# Patient Record
Sex: Female | Born: 1935
Health system: Southern US, Community
[De-identification: ages and names within clinical notes are randomized; demographics above are authoritative.]

## PROBLEM LIST (undated history)

## (undated) DIAGNOSIS — F329 Major depressive disorder, single episode, unspecified: Secondary | ICD-10-CM

## (undated) DIAGNOSIS — E559 Vitamin D deficiency, unspecified: Secondary | ICD-10-CM

## (undated) DIAGNOSIS — E039 Hypothyroidism, unspecified: Secondary | ICD-10-CM

## (undated) DIAGNOSIS — I10 Essential (primary) hypertension: Secondary | ICD-10-CM

## (undated) DIAGNOSIS — F0391 Unspecified dementia with behavioral disturbance: Secondary | ICD-10-CM

## (undated) DIAGNOSIS — E785 Hyperlipidemia, unspecified: Secondary | ICD-10-CM

## (undated) DIAGNOSIS — N189 Chronic kidney disease, unspecified: Secondary | ICD-10-CM

## (undated) DIAGNOSIS — B86 Scabies: Secondary | ICD-10-CM

## (undated) DIAGNOSIS — F32A Depression, unspecified: Secondary | ICD-10-CM

## (undated) DIAGNOSIS — E119 Type 2 diabetes mellitus without complications: Secondary | ICD-10-CM

## (undated) HISTORY — DX: Vitamin D deficiency, unspecified: E55.9

## (undated) HISTORY — DX: Type 2 diabetes mellitus without complications: E11.9

## (undated) HISTORY — DX: Scabies: B86

## (undated) HISTORY — DX: Chronic kidney disease, unspecified: N18.9

## (undated) HISTORY — DX: Major depressive disorder, single episode, unspecified: F32.9

## (undated) HISTORY — DX: Hyperlipidemia, unspecified: E78.5

## (undated) HISTORY — DX: Essential (primary) hypertension: I10

## (undated) HISTORY — DX: Depression, unspecified: F32.A

## (undated) HISTORY — DX: Unspecified dementia with behavioral disturbance: F03.91

## (undated) HISTORY — PX: OTHER SURGICAL HISTORY: SHX169

## (undated) HISTORY — DX: Hypothyroidism, unspecified: E03.9

---
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
## 2015-03-19 DIAGNOSIS — F0391 Unspecified dementia with behavioral disturbance: Secondary | ICD-10-CM

## 2015-03-19 DIAGNOSIS — Z7189 Other specified counseling: Secondary | ICD-10-CM | POA: Insufficient documentation

## 2015-03-19 DIAGNOSIS — F03918 Unspecified dementia, unspecified severity, with other behavioral disturbance: Secondary | ICD-10-CM

## 2015-03-19 HISTORY — DX: Unspecified dementia, unspecified severity, with other behavioral disturbance: F03.918

## 2015-03-19 HISTORY — DX: Unspecified dementia with behavioral disturbance: F03.91

## 2015-06-22 DIAGNOSIS — D709 Neutropenia, unspecified: Secondary | ICD-10-CM | POA: Diagnosis not present

## 2015-06-22 DIAGNOSIS — N183 Chronic kidney disease, stage 3 (moderate): Secondary | ICD-10-CM | POA: Diagnosis not present

## 2015-07-19 DIAGNOSIS — F0390 Unspecified dementia without behavioral disturbance: Secondary | ICD-10-CM | POA: Diagnosis not present

## 2015-07-20 DIAGNOSIS — N390 Urinary tract infection, site not specified: Secondary | ICD-10-CM | POA: Diagnosis not present

## 2015-07-20 DIAGNOSIS — N183 Chronic kidney disease, stage 3 (moderate): Secondary | ICD-10-CM | POA: Diagnosis not present

## 2015-07-21 DIAGNOSIS — R489 Unspecified symbolic dysfunctions: Secondary | ICD-10-CM | POA: Diagnosis not present

## 2015-07-21 DIAGNOSIS — R2689 Other abnormalities of gait and mobility: Secondary | ICD-10-CM | POA: Diagnosis not present

## 2015-07-21 DIAGNOSIS — M6281 Muscle weakness (generalized): Secondary | ICD-10-CM | POA: Diagnosis not present

## 2015-07-21 DIAGNOSIS — F0391 Unspecified dementia with behavioral disturbance: Secondary | ICD-10-CM | POA: Diagnosis not present

## 2015-07-21 DIAGNOSIS — F329 Major depressive disorder, single episode, unspecified: Secondary | ICD-10-CM | POA: Diagnosis not present

## 2015-07-21 DIAGNOSIS — R41841 Cognitive communication deficit: Secondary | ICD-10-CM | POA: Diagnosis not present

## 2015-07-21 DIAGNOSIS — N183 Chronic kidney disease, stage 3 (moderate): Secondary | ICD-10-CM | POA: Diagnosis not present

## 2015-07-22 DIAGNOSIS — N183 Chronic kidney disease, stage 3 (moderate): Secondary | ICD-10-CM | POA: Diagnosis not present

## 2015-07-22 DIAGNOSIS — R489 Unspecified symbolic dysfunctions: Secondary | ICD-10-CM | POA: Diagnosis not present

## 2015-07-22 DIAGNOSIS — M6281 Muscle weakness (generalized): Secondary | ICD-10-CM | POA: Diagnosis not present

## 2015-07-22 DIAGNOSIS — F329 Major depressive disorder, single episode, unspecified: Secondary | ICD-10-CM | POA: Diagnosis not present

## 2015-07-22 DIAGNOSIS — R2689 Other abnormalities of gait and mobility: Secondary | ICD-10-CM | POA: Diagnosis not present

## 2015-07-22 DIAGNOSIS — F0391 Unspecified dementia with behavioral disturbance: Secondary | ICD-10-CM | POA: Diagnosis not present

## 2015-07-24 DIAGNOSIS — R2689 Other abnormalities of gait and mobility: Secondary | ICD-10-CM | POA: Diagnosis not present

## 2015-07-24 DIAGNOSIS — N183 Chronic kidney disease, stage 3 (moderate): Secondary | ICD-10-CM | POA: Diagnosis not present

## 2015-07-24 DIAGNOSIS — R489 Unspecified symbolic dysfunctions: Secondary | ICD-10-CM | POA: Diagnosis not present

## 2015-07-24 DIAGNOSIS — F329 Major depressive disorder, single episode, unspecified: Secondary | ICD-10-CM | POA: Diagnosis not present

## 2015-07-24 DIAGNOSIS — F0391 Unspecified dementia with behavioral disturbance: Secondary | ICD-10-CM | POA: Diagnosis not present

## 2015-07-24 DIAGNOSIS — M6281 Muscle weakness (generalized): Secondary | ICD-10-CM | POA: Diagnosis not present

## 2015-07-25 DIAGNOSIS — F329 Major depressive disorder, single episode, unspecified: Secondary | ICD-10-CM | POA: Diagnosis not present

## 2015-07-25 DIAGNOSIS — F0391 Unspecified dementia with behavioral disturbance: Secondary | ICD-10-CM | POA: Diagnosis not present

## 2015-07-25 DIAGNOSIS — M6281 Muscle weakness (generalized): Secondary | ICD-10-CM | POA: Diagnosis not present

## 2015-07-25 DIAGNOSIS — E119 Type 2 diabetes mellitus without complications: Secondary | ICD-10-CM | POA: Diagnosis not present

## 2015-07-25 DIAGNOSIS — R2689 Other abnormalities of gait and mobility: Secondary | ICD-10-CM | POA: Diagnosis not present

## 2015-07-25 DIAGNOSIS — R489 Unspecified symbolic dysfunctions: Secondary | ICD-10-CM | POA: Diagnosis not present

## 2015-07-25 DIAGNOSIS — N183 Chronic kidney disease, stage 3 (moderate): Secondary | ICD-10-CM | POA: Diagnosis not present

## 2015-07-25 DIAGNOSIS — H25813 Combined forms of age-related cataract, bilateral: Secondary | ICD-10-CM | POA: Diagnosis not present

## 2015-07-26 DIAGNOSIS — R2689 Other abnormalities of gait and mobility: Secondary | ICD-10-CM | POA: Diagnosis not present

## 2015-07-26 DIAGNOSIS — M6281 Muscle weakness (generalized): Secondary | ICD-10-CM | POA: Diagnosis not present

## 2015-07-26 DIAGNOSIS — F329 Major depressive disorder, single episode, unspecified: Secondary | ICD-10-CM | POA: Diagnosis not present

## 2015-07-26 DIAGNOSIS — R489 Unspecified symbolic dysfunctions: Secondary | ICD-10-CM | POA: Diagnosis not present

## 2015-07-26 DIAGNOSIS — N183 Chronic kidney disease, stage 3 (moderate): Secondary | ICD-10-CM | POA: Diagnosis not present

## 2015-07-26 DIAGNOSIS — F0391 Unspecified dementia with behavioral disturbance: Secondary | ICD-10-CM | POA: Diagnosis not present

## 2015-07-27 DIAGNOSIS — R489 Unspecified symbolic dysfunctions: Secondary | ICD-10-CM | POA: Diagnosis not present

## 2015-07-27 DIAGNOSIS — F0391 Unspecified dementia with behavioral disturbance: Secondary | ICD-10-CM | POA: Diagnosis not present

## 2015-07-27 DIAGNOSIS — M6281 Muscle weakness (generalized): Secondary | ICD-10-CM | POA: Diagnosis not present

## 2015-07-27 DIAGNOSIS — N183 Chronic kidney disease, stage 3 (moderate): Secondary | ICD-10-CM | POA: Diagnosis not present

## 2015-07-27 DIAGNOSIS — R2689 Other abnormalities of gait and mobility: Secondary | ICD-10-CM | POA: Diagnosis not present

## 2015-07-27 DIAGNOSIS — F329 Major depressive disorder, single episode, unspecified: Secondary | ICD-10-CM | POA: Diagnosis not present

## 2015-07-28 DIAGNOSIS — F0391 Unspecified dementia with behavioral disturbance: Secondary | ICD-10-CM | POA: Diagnosis not present

## 2015-07-28 DIAGNOSIS — N183 Chronic kidney disease, stage 3 (moderate): Secondary | ICD-10-CM | POA: Diagnosis not present

## 2015-07-28 DIAGNOSIS — R489 Unspecified symbolic dysfunctions: Secondary | ICD-10-CM | POA: Diagnosis not present

## 2015-07-28 DIAGNOSIS — F329 Major depressive disorder, single episode, unspecified: Secondary | ICD-10-CM | POA: Diagnosis not present

## 2015-07-28 DIAGNOSIS — M6281 Muscle weakness (generalized): Secondary | ICD-10-CM | POA: Diagnosis not present

## 2015-07-28 DIAGNOSIS — R2689 Other abnormalities of gait and mobility: Secondary | ICD-10-CM | POA: Diagnosis not present

## 2015-08-02 DIAGNOSIS — N183 Chronic kidney disease, stage 3 (moderate): Secondary | ICD-10-CM | POA: Diagnosis not present

## 2015-08-02 DIAGNOSIS — R489 Unspecified symbolic dysfunctions: Secondary | ICD-10-CM | POA: Diagnosis not present

## 2015-08-02 DIAGNOSIS — F329 Major depressive disorder, single episode, unspecified: Secondary | ICD-10-CM | POA: Diagnosis not present

## 2015-08-02 DIAGNOSIS — R2689 Other abnormalities of gait and mobility: Secondary | ICD-10-CM | POA: Diagnosis not present

## 2015-08-02 DIAGNOSIS — M6281 Muscle weakness (generalized): Secondary | ICD-10-CM | POA: Diagnosis not present

## 2015-08-02 DIAGNOSIS — F0390 Unspecified dementia without behavioral disturbance: Secondary | ICD-10-CM | POA: Diagnosis not present

## 2015-08-02 DIAGNOSIS — F0391 Unspecified dementia with behavioral disturbance: Secondary | ICD-10-CM | POA: Diagnosis not present

## 2015-08-03 DIAGNOSIS — M6281 Muscle weakness (generalized): Secondary | ICD-10-CM | POA: Diagnosis not present

## 2015-08-03 DIAGNOSIS — F329 Major depressive disorder, single episode, unspecified: Secondary | ICD-10-CM | POA: Diagnosis not present

## 2015-08-03 DIAGNOSIS — N183 Chronic kidney disease, stage 3 (moderate): Secondary | ICD-10-CM | POA: Diagnosis not present

## 2015-08-03 DIAGNOSIS — R489 Unspecified symbolic dysfunctions: Secondary | ICD-10-CM | POA: Diagnosis not present

## 2015-08-03 DIAGNOSIS — F0391 Unspecified dementia with behavioral disturbance: Secondary | ICD-10-CM | POA: Diagnosis not present

## 2015-08-03 DIAGNOSIS — R2689 Other abnormalities of gait and mobility: Secondary | ICD-10-CM | POA: Diagnosis not present

## 2015-08-04 DIAGNOSIS — F0391 Unspecified dementia with behavioral disturbance: Secondary | ICD-10-CM | POA: Diagnosis not present

## 2015-08-04 DIAGNOSIS — M6281 Muscle weakness (generalized): Secondary | ICD-10-CM | POA: Diagnosis not present

## 2015-08-04 DIAGNOSIS — R489 Unspecified symbolic dysfunctions: Secondary | ICD-10-CM | POA: Diagnosis not present

## 2015-08-04 DIAGNOSIS — F329 Major depressive disorder, single episode, unspecified: Secondary | ICD-10-CM | POA: Diagnosis not present

## 2015-08-04 DIAGNOSIS — R2689 Other abnormalities of gait and mobility: Secondary | ICD-10-CM | POA: Diagnosis not present

## 2015-08-04 DIAGNOSIS — N183 Chronic kidney disease, stage 3 (moderate): Secondary | ICD-10-CM | POA: Diagnosis not present

## 2015-08-05 DIAGNOSIS — R489 Unspecified symbolic dysfunctions: Secondary | ICD-10-CM | POA: Diagnosis not present

## 2015-08-05 DIAGNOSIS — F329 Major depressive disorder, single episode, unspecified: Secondary | ICD-10-CM | POA: Diagnosis not present

## 2015-08-05 DIAGNOSIS — M6281 Muscle weakness (generalized): Secondary | ICD-10-CM | POA: Diagnosis not present

## 2015-08-05 DIAGNOSIS — N183 Chronic kidney disease, stage 3 (moderate): Secondary | ICD-10-CM | POA: Diagnosis not present

## 2015-08-05 DIAGNOSIS — F0391 Unspecified dementia with behavioral disturbance: Secondary | ICD-10-CM | POA: Diagnosis not present

## 2015-08-05 DIAGNOSIS — R2689 Other abnormalities of gait and mobility: Secondary | ICD-10-CM | POA: Diagnosis not present

## 2015-08-08 DIAGNOSIS — N183 Chronic kidney disease, stage 3 (moderate): Secondary | ICD-10-CM | POA: Diagnosis not present

## 2015-08-08 DIAGNOSIS — R2689 Other abnormalities of gait and mobility: Secondary | ICD-10-CM | POA: Diagnosis not present

## 2015-08-08 DIAGNOSIS — R489 Unspecified symbolic dysfunctions: Secondary | ICD-10-CM | POA: Diagnosis not present

## 2015-08-08 DIAGNOSIS — F329 Major depressive disorder, single episode, unspecified: Secondary | ICD-10-CM | POA: Diagnosis not present

## 2015-08-08 DIAGNOSIS — F0391 Unspecified dementia with behavioral disturbance: Secondary | ICD-10-CM | POA: Diagnosis not present

## 2015-08-08 DIAGNOSIS — M6281 Muscle weakness (generalized): Secondary | ICD-10-CM | POA: Diagnosis not present

## 2015-08-09 DIAGNOSIS — R489 Unspecified symbolic dysfunctions: Secondary | ICD-10-CM | POA: Diagnosis not present

## 2015-08-09 DIAGNOSIS — M6281 Muscle weakness (generalized): Secondary | ICD-10-CM | POA: Diagnosis not present

## 2015-08-09 DIAGNOSIS — F329 Major depressive disorder, single episode, unspecified: Secondary | ICD-10-CM | POA: Diagnosis not present

## 2015-08-09 DIAGNOSIS — F0391 Unspecified dementia with behavioral disturbance: Secondary | ICD-10-CM | POA: Diagnosis not present

## 2015-08-09 DIAGNOSIS — R2689 Other abnormalities of gait and mobility: Secondary | ICD-10-CM | POA: Diagnosis not present

## 2015-08-09 DIAGNOSIS — N183 Chronic kidney disease, stage 3 (moderate): Secondary | ICD-10-CM | POA: Diagnosis not present

## 2015-08-10 DIAGNOSIS — F0391 Unspecified dementia with behavioral disturbance: Secondary | ICD-10-CM | POA: Diagnosis not present

## 2015-08-10 DIAGNOSIS — R489 Unspecified symbolic dysfunctions: Secondary | ICD-10-CM | POA: Diagnosis not present

## 2015-08-10 DIAGNOSIS — M6281 Muscle weakness (generalized): Secondary | ICD-10-CM | POA: Diagnosis not present

## 2015-08-10 DIAGNOSIS — F329 Major depressive disorder, single episode, unspecified: Secondary | ICD-10-CM | POA: Diagnosis not present

## 2015-08-10 DIAGNOSIS — R2689 Other abnormalities of gait and mobility: Secondary | ICD-10-CM | POA: Diagnosis not present

## 2015-08-10 DIAGNOSIS — N183 Chronic kidney disease, stage 3 (moderate): Secondary | ICD-10-CM | POA: Diagnosis not present

## 2015-08-11 DIAGNOSIS — M6281 Muscle weakness (generalized): Secondary | ICD-10-CM | POA: Diagnosis not present

## 2015-08-11 DIAGNOSIS — F329 Major depressive disorder, single episode, unspecified: Secondary | ICD-10-CM | POA: Diagnosis not present

## 2015-08-11 DIAGNOSIS — R2689 Other abnormalities of gait and mobility: Secondary | ICD-10-CM | POA: Diagnosis not present

## 2015-08-11 DIAGNOSIS — N183 Chronic kidney disease, stage 3 (moderate): Secondary | ICD-10-CM | POA: Diagnosis not present

## 2015-08-11 DIAGNOSIS — R489 Unspecified symbolic dysfunctions: Secondary | ICD-10-CM | POA: Diagnosis not present

## 2015-08-11 DIAGNOSIS — F0391 Unspecified dementia with behavioral disturbance: Secondary | ICD-10-CM | POA: Diagnosis not present

## 2015-08-12 DIAGNOSIS — M6281 Muscle weakness (generalized): Secondary | ICD-10-CM | POA: Diagnosis not present

## 2015-08-12 DIAGNOSIS — R2689 Other abnormalities of gait and mobility: Secondary | ICD-10-CM | POA: Diagnosis not present

## 2015-08-12 DIAGNOSIS — F0391 Unspecified dementia with behavioral disturbance: Secondary | ICD-10-CM | POA: Diagnosis not present

## 2015-08-12 DIAGNOSIS — R489 Unspecified symbolic dysfunctions: Secondary | ICD-10-CM | POA: Diagnosis not present

## 2015-08-12 DIAGNOSIS — N183 Chronic kidney disease, stage 3 (moderate): Secondary | ICD-10-CM | POA: Diagnosis not present

## 2015-08-12 DIAGNOSIS — F329 Major depressive disorder, single episode, unspecified: Secondary | ICD-10-CM | POA: Diagnosis not present

## 2015-08-15 DIAGNOSIS — F0391 Unspecified dementia with behavioral disturbance: Secondary | ICD-10-CM | POA: Diagnosis not present

## 2015-08-15 DIAGNOSIS — F329 Major depressive disorder, single episode, unspecified: Secondary | ICD-10-CM | POA: Diagnosis not present

## 2015-08-15 DIAGNOSIS — R2689 Other abnormalities of gait and mobility: Secondary | ICD-10-CM | POA: Diagnosis not present

## 2015-08-15 DIAGNOSIS — N183 Chronic kidney disease, stage 3 (moderate): Secondary | ICD-10-CM | POA: Diagnosis not present

## 2015-08-15 DIAGNOSIS — M6281 Muscle weakness (generalized): Secondary | ICD-10-CM | POA: Diagnosis not present

## 2015-08-15 DIAGNOSIS — R489 Unspecified symbolic dysfunctions: Secondary | ICD-10-CM | POA: Diagnosis not present

## 2015-08-16 DIAGNOSIS — F329 Major depressive disorder, single episode, unspecified: Secondary | ICD-10-CM | POA: Diagnosis not present

## 2015-08-16 DIAGNOSIS — M6281 Muscle weakness (generalized): Secondary | ICD-10-CM | POA: Diagnosis not present

## 2015-08-16 DIAGNOSIS — F0391 Unspecified dementia with behavioral disturbance: Secondary | ICD-10-CM | POA: Diagnosis not present

## 2015-08-16 DIAGNOSIS — R2689 Other abnormalities of gait and mobility: Secondary | ICD-10-CM | POA: Diagnosis not present

## 2015-08-16 DIAGNOSIS — N183 Chronic kidney disease, stage 3 (moderate): Secondary | ICD-10-CM | POA: Diagnosis not present

## 2015-08-16 DIAGNOSIS — R489 Unspecified symbolic dysfunctions: Secondary | ICD-10-CM | POA: Diagnosis not present

## 2015-08-17 DIAGNOSIS — R41841 Cognitive communication deficit: Secondary | ICD-10-CM | POA: Diagnosis not present

## 2015-08-17 DIAGNOSIS — F0391 Unspecified dementia with behavioral disturbance: Secondary | ICD-10-CM | POA: Diagnosis not present

## 2015-08-18 DIAGNOSIS — R41841 Cognitive communication deficit: Secondary | ICD-10-CM | POA: Diagnosis not present

## 2015-08-18 DIAGNOSIS — F0391 Unspecified dementia with behavioral disturbance: Secondary | ICD-10-CM | POA: Diagnosis not present

## 2015-08-19 DIAGNOSIS — R41841 Cognitive communication deficit: Secondary | ICD-10-CM | POA: Diagnosis not present

## 2015-08-19 DIAGNOSIS — F0391 Unspecified dementia with behavioral disturbance: Secondary | ICD-10-CM | POA: Diagnosis not present

## 2015-08-22 DIAGNOSIS — F0391 Unspecified dementia with behavioral disturbance: Secondary | ICD-10-CM | POA: Diagnosis not present

## 2015-08-22 DIAGNOSIS — R41841 Cognitive communication deficit: Secondary | ICD-10-CM | POA: Diagnosis not present

## 2015-08-23 DIAGNOSIS — F0390 Unspecified dementia without behavioral disturbance: Secondary | ICD-10-CM | POA: Diagnosis not present

## 2015-08-23 DIAGNOSIS — F0391 Unspecified dementia with behavioral disturbance: Secondary | ICD-10-CM | POA: Diagnosis not present

## 2015-08-23 DIAGNOSIS — R41841 Cognitive communication deficit: Secondary | ICD-10-CM | POA: Diagnosis not present

## 2015-08-24 DIAGNOSIS — F0391 Unspecified dementia with behavioral disturbance: Secondary | ICD-10-CM | POA: Diagnosis not present

## 2015-08-24 DIAGNOSIS — R41841 Cognitive communication deficit: Secondary | ICD-10-CM | POA: Diagnosis not present

## 2015-08-24 DIAGNOSIS — N183 Chronic kidney disease, stage 3 (moderate): Secondary | ICD-10-CM | POA: Diagnosis not present

## 2015-08-24 DIAGNOSIS — N390 Urinary tract infection, site not specified: Secondary | ICD-10-CM | POA: Diagnosis not present

## 2015-08-25 DIAGNOSIS — R41841 Cognitive communication deficit: Secondary | ICD-10-CM | POA: Diagnosis not present

## 2015-08-25 DIAGNOSIS — N390 Urinary tract infection, site not specified: Secondary | ICD-10-CM | POA: Diagnosis not present

## 2015-08-25 DIAGNOSIS — R358 Other polyuria: Secondary | ICD-10-CM | POA: Diagnosis not present

## 2015-08-25 DIAGNOSIS — F0391 Unspecified dementia with behavioral disturbance: Secondary | ICD-10-CM | POA: Diagnosis not present

## 2015-09-02 DIAGNOSIS — N359 Urethral stricture, unspecified: Secondary | ICD-10-CM | POA: Diagnosis not present

## 2015-09-02 DIAGNOSIS — R300 Dysuria: Secondary | ICD-10-CM | POA: Diagnosis not present

## 2015-09-20 DIAGNOSIS — F0390 Unspecified dementia without behavioral disturbance: Secondary | ICD-10-CM | POA: Diagnosis not present

## 2015-10-05 DIAGNOSIS — M79672 Pain in left foot: Secondary | ICD-10-CM | POA: Diagnosis not present

## 2015-10-05 DIAGNOSIS — E119 Type 2 diabetes mellitus without complications: Secondary | ICD-10-CM | POA: Diagnosis not present

## 2015-10-05 DIAGNOSIS — B351 Tinea unguium: Secondary | ICD-10-CM | POA: Diagnosis not present

## 2015-10-05 DIAGNOSIS — M79671 Pain in right foot: Secondary | ICD-10-CM | POA: Diagnosis not present

## 2015-11-04 DIAGNOSIS — N359 Urethral stricture, unspecified: Secondary | ICD-10-CM | POA: Diagnosis not present

## 2015-11-18 DIAGNOSIS — E119 Type 2 diabetes mellitus without complications: Secondary | ICD-10-CM | POA: Diagnosis not present

## 2015-11-18 DIAGNOSIS — D519 Vitamin B12 deficiency anemia, unspecified: Secondary | ICD-10-CM | POA: Diagnosis not present

## 2015-11-18 DIAGNOSIS — I10 Essential (primary) hypertension: Secondary | ICD-10-CM | POA: Diagnosis not present

## 2015-11-18 DIAGNOSIS — N183 Chronic kidney disease, stage 3 (moderate): Secondary | ICD-10-CM | POA: Diagnosis not present

## 2015-11-18 DIAGNOSIS — E785 Hyperlipidemia, unspecified: Secondary | ICD-10-CM | POA: Diagnosis not present

## 2015-11-18 DIAGNOSIS — E039 Hypothyroidism, unspecified: Secondary | ICD-10-CM | POA: Diagnosis not present

## 2015-11-18 DIAGNOSIS — E559 Vitamin D deficiency, unspecified: Secondary | ICD-10-CM | POA: Diagnosis not present

## 2015-12-23 DIAGNOSIS — N183 Chronic kidney disease, stage 3 (moderate): Secondary | ICD-10-CM | POA: Diagnosis not present

## 2015-12-23 DIAGNOSIS — F23 Brief psychotic disorder: Secondary | ICD-10-CM | POA: Diagnosis not present

## 2015-12-23 DIAGNOSIS — R262 Difficulty in walking, not elsewhere classified: Secondary | ICD-10-CM | POA: Diagnosis not present

## 2015-12-23 DIAGNOSIS — Z741 Need for assistance with personal care: Secondary | ICD-10-CM | POA: Diagnosis not present

## 2015-12-23 DIAGNOSIS — F0391 Unspecified dementia with behavioral disturbance: Secondary | ICD-10-CM | POA: Diagnosis not present

## 2015-12-23 DIAGNOSIS — Z9181 History of falling: Secondary | ICD-10-CM | POA: Diagnosis not present

## 2015-12-23 DIAGNOSIS — R278 Other lack of coordination: Secondary | ICD-10-CM | POA: Diagnosis not present

## 2015-12-23 DIAGNOSIS — M6281 Muscle weakness (generalized): Secondary | ICD-10-CM | POA: Diagnosis not present

## 2015-12-23 DIAGNOSIS — F329 Major depressive disorder, single episode, unspecified: Secondary | ICD-10-CM | POA: Diagnosis not present

## 2015-12-26 DIAGNOSIS — Z9181 History of falling: Secondary | ICD-10-CM | POA: Diagnosis not present

## 2015-12-26 DIAGNOSIS — F23 Brief psychotic disorder: Secondary | ICD-10-CM | POA: Diagnosis not present

## 2015-12-26 DIAGNOSIS — N183 Chronic kidney disease, stage 3 (moderate): Secondary | ICD-10-CM | POA: Diagnosis not present

## 2015-12-26 DIAGNOSIS — F0391 Unspecified dementia with behavioral disturbance: Secondary | ICD-10-CM | POA: Diagnosis not present

## 2015-12-26 DIAGNOSIS — Z741 Need for assistance with personal care: Secondary | ICD-10-CM | POA: Diagnosis not present

## 2015-12-26 DIAGNOSIS — R278 Other lack of coordination: Secondary | ICD-10-CM | POA: Diagnosis not present

## 2015-12-27 DIAGNOSIS — Z741 Need for assistance with personal care: Secondary | ICD-10-CM | POA: Diagnosis not present

## 2015-12-27 DIAGNOSIS — F0391 Unspecified dementia with behavioral disturbance: Secondary | ICD-10-CM | POA: Diagnosis not present

## 2015-12-27 DIAGNOSIS — N183 Chronic kidney disease, stage 3 (moderate): Secondary | ICD-10-CM | POA: Diagnosis not present

## 2015-12-27 DIAGNOSIS — R278 Other lack of coordination: Secondary | ICD-10-CM | POA: Diagnosis not present

## 2015-12-27 DIAGNOSIS — F23 Brief psychotic disorder: Secondary | ICD-10-CM | POA: Diagnosis not present

## 2015-12-27 DIAGNOSIS — Z9181 History of falling: Secondary | ICD-10-CM | POA: Diagnosis not present

## 2015-12-28 DIAGNOSIS — F0391 Unspecified dementia with behavioral disturbance: Secondary | ICD-10-CM | POA: Diagnosis not present

## 2015-12-28 DIAGNOSIS — R278 Other lack of coordination: Secondary | ICD-10-CM | POA: Diagnosis not present

## 2015-12-28 DIAGNOSIS — Z741 Need for assistance with personal care: Secondary | ICD-10-CM | POA: Diagnosis not present

## 2015-12-28 DIAGNOSIS — N183 Chronic kidney disease, stage 3 (moderate): Secondary | ICD-10-CM | POA: Diagnosis not present

## 2015-12-28 DIAGNOSIS — Z9181 History of falling: Secondary | ICD-10-CM | POA: Diagnosis not present

## 2015-12-28 DIAGNOSIS — F23 Brief psychotic disorder: Secondary | ICD-10-CM | POA: Diagnosis not present

## 2015-12-29 DIAGNOSIS — F23 Brief psychotic disorder: Secondary | ICD-10-CM | POA: Diagnosis not present

## 2015-12-29 DIAGNOSIS — N183 Chronic kidney disease, stage 3 (moderate): Secondary | ICD-10-CM | POA: Diagnosis not present

## 2015-12-29 DIAGNOSIS — Z741 Need for assistance with personal care: Secondary | ICD-10-CM | POA: Diagnosis not present

## 2015-12-29 DIAGNOSIS — R278 Other lack of coordination: Secondary | ICD-10-CM | POA: Diagnosis not present

## 2015-12-29 DIAGNOSIS — Z9181 History of falling: Secondary | ICD-10-CM | POA: Diagnosis not present

## 2015-12-29 DIAGNOSIS — F0391 Unspecified dementia with behavioral disturbance: Secondary | ICD-10-CM | POA: Diagnosis not present

## 2015-12-29 DIAGNOSIS — R2241 Localized swelling, mass and lump, right lower limb: Secondary | ICD-10-CM | POA: Diagnosis not present

## 2015-12-30 DIAGNOSIS — N183 Chronic kidney disease, stage 3 (moderate): Secondary | ICD-10-CM | POA: Diagnosis not present

## 2015-12-30 DIAGNOSIS — R278 Other lack of coordination: Secondary | ICD-10-CM | POA: Diagnosis not present

## 2015-12-30 DIAGNOSIS — F23 Brief psychotic disorder: Secondary | ICD-10-CM | POA: Diagnosis not present

## 2015-12-30 DIAGNOSIS — Z741 Need for assistance with personal care: Secondary | ICD-10-CM | POA: Diagnosis not present

## 2015-12-30 DIAGNOSIS — F0391 Unspecified dementia with behavioral disturbance: Secondary | ICD-10-CM | POA: Diagnosis not present

## 2015-12-30 DIAGNOSIS — Z9181 History of falling: Secondary | ICD-10-CM | POA: Diagnosis not present

## 2016-01-02 DIAGNOSIS — N183 Chronic kidney disease, stage 3 (moderate): Secondary | ICD-10-CM | POA: Diagnosis not present

## 2016-01-02 DIAGNOSIS — F23 Brief psychotic disorder: Secondary | ICD-10-CM | POA: Diagnosis not present

## 2016-01-02 DIAGNOSIS — Z741 Need for assistance with personal care: Secondary | ICD-10-CM | POA: Diagnosis not present

## 2016-01-02 DIAGNOSIS — R278 Other lack of coordination: Secondary | ICD-10-CM | POA: Diagnosis not present

## 2016-01-02 DIAGNOSIS — Z9181 History of falling: Secondary | ICD-10-CM | POA: Diagnosis not present

## 2016-01-02 DIAGNOSIS — F0391 Unspecified dementia with behavioral disturbance: Secondary | ICD-10-CM | POA: Diagnosis not present

## 2016-01-03 DIAGNOSIS — N183 Chronic kidney disease, stage 3 (moderate): Secondary | ICD-10-CM | POA: Diagnosis not present

## 2016-01-03 DIAGNOSIS — F0391 Unspecified dementia with behavioral disturbance: Secondary | ICD-10-CM | POA: Diagnosis not present

## 2016-01-03 DIAGNOSIS — R278 Other lack of coordination: Secondary | ICD-10-CM | POA: Diagnosis not present

## 2016-01-03 DIAGNOSIS — Z741 Need for assistance with personal care: Secondary | ICD-10-CM | POA: Diagnosis not present

## 2016-01-03 DIAGNOSIS — Z9181 History of falling: Secondary | ICD-10-CM | POA: Diagnosis not present

## 2016-01-03 DIAGNOSIS — F23 Brief psychotic disorder: Secondary | ICD-10-CM | POA: Diagnosis not present

## 2016-01-04 DIAGNOSIS — F23 Brief psychotic disorder: Secondary | ICD-10-CM | POA: Diagnosis not present

## 2016-01-04 DIAGNOSIS — N183 Chronic kidney disease, stage 3 (moderate): Secondary | ICD-10-CM | POA: Diagnosis not present

## 2016-01-04 DIAGNOSIS — Z9181 History of falling: Secondary | ICD-10-CM | POA: Diagnosis not present

## 2016-01-04 DIAGNOSIS — Z741 Need for assistance with personal care: Secondary | ICD-10-CM | POA: Diagnosis not present

## 2016-01-04 DIAGNOSIS — R278 Other lack of coordination: Secondary | ICD-10-CM | POA: Diagnosis not present

## 2016-01-04 DIAGNOSIS — F0391 Unspecified dementia with behavioral disturbance: Secondary | ICD-10-CM | POA: Diagnosis not present

## 2016-01-05 DIAGNOSIS — F0391 Unspecified dementia with behavioral disturbance: Secondary | ICD-10-CM | POA: Diagnosis not present

## 2016-01-05 DIAGNOSIS — F23 Brief psychotic disorder: Secondary | ICD-10-CM | POA: Diagnosis not present

## 2016-01-05 DIAGNOSIS — Z9181 History of falling: Secondary | ICD-10-CM | POA: Diagnosis not present

## 2016-01-05 DIAGNOSIS — R278 Other lack of coordination: Secondary | ICD-10-CM | POA: Diagnosis not present

## 2016-01-05 DIAGNOSIS — N183 Chronic kidney disease, stage 3 (moderate): Secondary | ICD-10-CM | POA: Diagnosis not present

## 2016-01-05 DIAGNOSIS — Z741 Need for assistance with personal care: Secondary | ICD-10-CM | POA: Diagnosis not present

## 2016-01-06 DIAGNOSIS — F0391 Unspecified dementia with behavioral disturbance: Secondary | ICD-10-CM | POA: Diagnosis not present

## 2016-01-06 DIAGNOSIS — F23 Brief psychotic disorder: Secondary | ICD-10-CM | POA: Diagnosis not present

## 2016-01-06 DIAGNOSIS — R278 Other lack of coordination: Secondary | ICD-10-CM | POA: Diagnosis not present

## 2016-01-06 DIAGNOSIS — Z741 Need for assistance with personal care: Secondary | ICD-10-CM | POA: Diagnosis not present

## 2016-01-06 DIAGNOSIS — Z9181 History of falling: Secondary | ICD-10-CM | POA: Diagnosis not present

## 2016-01-06 DIAGNOSIS — E039 Hypothyroidism, unspecified: Secondary | ICD-10-CM | POA: Diagnosis not present

## 2016-01-06 DIAGNOSIS — N183 Chronic kidney disease, stage 3 (moderate): Secondary | ICD-10-CM | POA: Diagnosis not present

## 2016-01-09 DIAGNOSIS — R278 Other lack of coordination: Secondary | ICD-10-CM | POA: Diagnosis not present

## 2016-01-09 DIAGNOSIS — F23 Brief psychotic disorder: Secondary | ICD-10-CM | POA: Diagnosis not present

## 2016-01-09 DIAGNOSIS — N183 Chronic kidney disease, stage 3 (moderate): Secondary | ICD-10-CM | POA: Diagnosis not present

## 2016-01-09 DIAGNOSIS — Z741 Need for assistance with personal care: Secondary | ICD-10-CM | POA: Diagnosis not present

## 2016-01-09 DIAGNOSIS — F0391 Unspecified dementia with behavioral disturbance: Secondary | ICD-10-CM | POA: Diagnosis not present

## 2016-01-09 DIAGNOSIS — Z9181 History of falling: Secondary | ICD-10-CM | POA: Diagnosis not present

## 2016-01-25 DIAGNOSIS — B351 Tinea unguium: Secondary | ICD-10-CM | POA: Diagnosis not present

## 2016-01-25 DIAGNOSIS — M79674 Pain in right toe(s): Secondary | ICD-10-CM | POA: Diagnosis not present

## 2016-01-25 DIAGNOSIS — E1159 Type 2 diabetes mellitus with other circulatory complications: Secondary | ICD-10-CM | POA: Diagnosis not present

## 2016-01-25 DIAGNOSIS — M79675 Pain in left toe(s): Secondary | ICD-10-CM | POA: Diagnosis not present

## 2016-02-06 DIAGNOSIS — R600 Localized edema: Secondary | ICD-10-CM | POA: Diagnosis not present

## 2016-02-06 DIAGNOSIS — G4700 Insomnia, unspecified: Secondary | ICD-10-CM | POA: Diagnosis not present

## 2016-02-06 DIAGNOSIS — E039 Hypothyroidism, unspecified: Secondary | ICD-10-CM | POA: Diagnosis not present

## 2016-02-06 DIAGNOSIS — F0281 Dementia in other diseases classified elsewhere with behavioral disturbance: Secondary | ICD-10-CM | POA: Diagnosis not present

## 2016-02-07 DIAGNOSIS — R0602 Shortness of breath: Secondary | ICD-10-CM | POA: Diagnosis not present

## 2016-02-07 DIAGNOSIS — E039 Hypothyroidism, unspecified: Secondary | ICD-10-CM | POA: Diagnosis not present

## 2016-02-07 DIAGNOSIS — N183 Chronic kidney disease, stage 3 (moderate): Secondary | ICD-10-CM | POA: Diagnosis not present

## 2016-02-17 DIAGNOSIS — R269 Unspecified abnormalities of gait and mobility: Secondary | ICD-10-CM | POA: Diagnosis not present

## 2016-02-17 DIAGNOSIS — R4182 Altered mental status, unspecified: Secondary | ICD-10-CM | POA: Diagnosis not present

## 2016-02-17 DIAGNOSIS — Z6828 Body mass index (BMI) 28.0-28.9, adult: Secondary | ICD-10-CM | POA: Diagnosis not present

## 2016-02-17 DIAGNOSIS — F0281 Dementia in other diseases classified elsewhere with behavioral disturbance: Secondary | ICD-10-CM | POA: Diagnosis not present

## 2016-02-21 DIAGNOSIS — E559 Vitamin D deficiency, unspecified: Secondary | ICD-10-CM | POA: Diagnosis not present

## 2016-02-21 DIAGNOSIS — E039 Hypothyroidism, unspecified: Secondary | ICD-10-CM | POA: Diagnosis not present

## 2016-02-21 DIAGNOSIS — R600 Localized edema: Secondary | ICD-10-CM | POA: Diagnosis not present

## 2016-02-21 DIAGNOSIS — B86 Scabies: Secondary | ICD-10-CM | POA: Diagnosis not present

## 2016-02-29 DIAGNOSIS — F0391 Unspecified dementia with behavioral disturbance: Secondary | ICD-10-CM | POA: Diagnosis not present

## 2016-02-29 DIAGNOSIS — E119 Type 2 diabetes mellitus without complications: Secondary | ICD-10-CM | POA: Diagnosis not present

## 2016-02-29 DIAGNOSIS — I10 Essential (primary) hypertension: Secondary | ICD-10-CM | POA: Diagnosis not present

## 2016-02-29 DIAGNOSIS — F23 Brief psychotic disorder: Secondary | ICD-10-CM | POA: Diagnosis not present

## 2016-02-29 DIAGNOSIS — Z9181 History of falling: Secondary | ICD-10-CM | POA: Diagnosis not present

## 2016-02-29 DIAGNOSIS — R2689 Other abnormalities of gait and mobility: Secondary | ICD-10-CM | POA: Diagnosis not present

## 2016-03-01 DIAGNOSIS — F0391 Unspecified dementia with behavioral disturbance: Secondary | ICD-10-CM | POA: Diagnosis not present

## 2016-03-01 DIAGNOSIS — R2689 Other abnormalities of gait and mobility: Secondary | ICD-10-CM | POA: Diagnosis not present

## 2016-03-01 DIAGNOSIS — I10 Essential (primary) hypertension: Secondary | ICD-10-CM | POA: Diagnosis not present

## 2016-03-01 DIAGNOSIS — E119 Type 2 diabetes mellitus without complications: Secondary | ICD-10-CM | POA: Diagnosis not present

## 2016-03-01 DIAGNOSIS — F23 Brief psychotic disorder: Secondary | ICD-10-CM | POA: Diagnosis not present

## 2016-03-01 DIAGNOSIS — Z9181 History of falling: Secondary | ICD-10-CM | POA: Diagnosis not present

## 2016-03-04 DIAGNOSIS — F0391 Unspecified dementia with behavioral disturbance: Secondary | ICD-10-CM | POA: Diagnosis not present

## 2016-03-04 DIAGNOSIS — E119 Type 2 diabetes mellitus without complications: Secondary | ICD-10-CM | POA: Diagnosis not present

## 2016-03-04 DIAGNOSIS — R2689 Other abnormalities of gait and mobility: Secondary | ICD-10-CM | POA: Diagnosis not present

## 2016-03-04 DIAGNOSIS — Z9181 History of falling: Secondary | ICD-10-CM | POA: Diagnosis not present

## 2016-03-04 DIAGNOSIS — F23 Brief psychotic disorder: Secondary | ICD-10-CM | POA: Diagnosis not present

## 2016-03-04 DIAGNOSIS — I10 Essential (primary) hypertension: Secondary | ICD-10-CM | POA: Diagnosis not present

## 2016-03-05 DIAGNOSIS — I10 Essential (primary) hypertension: Secondary | ICD-10-CM | POA: Diagnosis not present

## 2016-03-05 DIAGNOSIS — E119 Type 2 diabetes mellitus without complications: Secondary | ICD-10-CM | POA: Diagnosis not present

## 2016-03-05 DIAGNOSIS — F23 Brief psychotic disorder: Secondary | ICD-10-CM | POA: Diagnosis not present

## 2016-03-05 DIAGNOSIS — F0391 Unspecified dementia with behavioral disturbance: Secondary | ICD-10-CM | POA: Diagnosis not present

## 2016-03-05 DIAGNOSIS — R2689 Other abnormalities of gait and mobility: Secondary | ICD-10-CM | POA: Diagnosis not present

## 2016-03-05 DIAGNOSIS — Z9181 History of falling: Secondary | ICD-10-CM | POA: Diagnosis not present

## 2016-03-06 DIAGNOSIS — I10 Essential (primary) hypertension: Secondary | ICD-10-CM | POA: Diagnosis not present

## 2016-03-06 DIAGNOSIS — E119 Type 2 diabetes mellitus without complications: Secondary | ICD-10-CM | POA: Diagnosis not present

## 2016-03-06 DIAGNOSIS — F23 Brief psychotic disorder: Secondary | ICD-10-CM | POA: Diagnosis not present

## 2016-03-06 DIAGNOSIS — R2689 Other abnormalities of gait and mobility: Secondary | ICD-10-CM | POA: Diagnosis not present

## 2016-03-06 DIAGNOSIS — Z9181 History of falling: Secondary | ICD-10-CM | POA: Diagnosis not present

## 2016-03-06 DIAGNOSIS — F0391 Unspecified dementia with behavioral disturbance: Secondary | ICD-10-CM | POA: Diagnosis not present

## 2016-03-07 DIAGNOSIS — F23 Brief psychotic disorder: Secondary | ICD-10-CM | POA: Diagnosis not present

## 2016-03-07 DIAGNOSIS — R2689 Other abnormalities of gait and mobility: Secondary | ICD-10-CM | POA: Diagnosis not present

## 2016-03-07 DIAGNOSIS — Z9181 History of falling: Secondary | ICD-10-CM | POA: Diagnosis not present

## 2016-03-07 DIAGNOSIS — E119 Type 2 diabetes mellitus without complications: Secondary | ICD-10-CM | POA: Diagnosis not present

## 2016-03-07 DIAGNOSIS — F0391 Unspecified dementia with behavioral disturbance: Secondary | ICD-10-CM | POA: Diagnosis not present

## 2016-03-07 DIAGNOSIS — I10 Essential (primary) hypertension: Secondary | ICD-10-CM | POA: Diagnosis not present

## 2016-03-08 DIAGNOSIS — F0391 Unspecified dementia with behavioral disturbance: Secondary | ICD-10-CM | POA: Diagnosis not present

## 2016-03-08 DIAGNOSIS — R2689 Other abnormalities of gait and mobility: Secondary | ICD-10-CM | POA: Diagnosis not present

## 2016-03-08 DIAGNOSIS — E119 Type 2 diabetes mellitus without complications: Secondary | ICD-10-CM | POA: Diagnosis not present

## 2016-03-08 DIAGNOSIS — F23 Brief psychotic disorder: Secondary | ICD-10-CM | POA: Diagnosis not present

## 2016-03-08 DIAGNOSIS — I10 Essential (primary) hypertension: Secondary | ICD-10-CM | POA: Diagnosis not present

## 2016-03-08 DIAGNOSIS — Z9181 History of falling: Secondary | ICD-10-CM | POA: Diagnosis not present

## 2016-03-09 DIAGNOSIS — Z9181 History of falling: Secondary | ICD-10-CM | POA: Diagnosis not present

## 2016-03-09 DIAGNOSIS — N183 Chronic kidney disease, stage 3 (moderate): Secondary | ICD-10-CM | POA: Diagnosis not present

## 2016-03-09 DIAGNOSIS — E119 Type 2 diabetes mellitus without complications: Secondary | ICD-10-CM | POA: Diagnosis not present

## 2016-03-09 DIAGNOSIS — R2689 Other abnormalities of gait and mobility: Secondary | ICD-10-CM | POA: Diagnosis not present

## 2016-03-09 DIAGNOSIS — F23 Brief psychotic disorder: Secondary | ICD-10-CM | POA: Diagnosis not present

## 2016-03-09 DIAGNOSIS — I10 Essential (primary) hypertension: Secondary | ICD-10-CM | POA: Diagnosis not present

## 2016-03-09 DIAGNOSIS — F0391 Unspecified dementia with behavioral disturbance: Secondary | ICD-10-CM | POA: Diagnosis not present

## 2016-03-10 DIAGNOSIS — R4182 Altered mental status, unspecified: Secondary | ICD-10-CM | POA: Diagnosis not present

## 2016-03-10 DIAGNOSIS — R269 Unspecified abnormalities of gait and mobility: Secondary | ICD-10-CM | POA: Diagnosis not present

## 2016-03-12 DIAGNOSIS — Z9181 History of falling: Secondary | ICD-10-CM | POA: Diagnosis not present

## 2016-03-12 DIAGNOSIS — I10 Essential (primary) hypertension: Secondary | ICD-10-CM | POA: Diagnosis not present

## 2016-03-12 DIAGNOSIS — F23 Brief psychotic disorder: Secondary | ICD-10-CM | POA: Diagnosis not present

## 2016-03-12 DIAGNOSIS — F0391 Unspecified dementia with behavioral disturbance: Secondary | ICD-10-CM | POA: Diagnosis not present

## 2016-03-12 DIAGNOSIS — E119 Type 2 diabetes mellitus without complications: Secondary | ICD-10-CM | POA: Diagnosis not present

## 2016-03-12 DIAGNOSIS — R2689 Other abnormalities of gait and mobility: Secondary | ICD-10-CM | POA: Diagnosis not present

## 2016-03-13 DIAGNOSIS — E119 Type 2 diabetes mellitus without complications: Secondary | ICD-10-CM | POA: Diagnosis not present

## 2016-03-13 DIAGNOSIS — Z9181 History of falling: Secondary | ICD-10-CM | POA: Diagnosis not present

## 2016-03-13 DIAGNOSIS — R2689 Other abnormalities of gait and mobility: Secondary | ICD-10-CM | POA: Diagnosis not present

## 2016-03-13 DIAGNOSIS — I10 Essential (primary) hypertension: Secondary | ICD-10-CM | POA: Diagnosis not present

## 2016-03-13 DIAGNOSIS — F23 Brief psychotic disorder: Secondary | ICD-10-CM | POA: Diagnosis not present

## 2016-03-13 DIAGNOSIS — F0391 Unspecified dementia with behavioral disturbance: Secondary | ICD-10-CM | POA: Diagnosis not present

## 2016-03-14 DIAGNOSIS — I10 Essential (primary) hypertension: Secondary | ICD-10-CM | POA: Diagnosis not present

## 2016-03-14 DIAGNOSIS — Z9181 History of falling: Secondary | ICD-10-CM | POA: Diagnosis not present

## 2016-03-14 DIAGNOSIS — R2689 Other abnormalities of gait and mobility: Secondary | ICD-10-CM | POA: Diagnosis not present

## 2016-03-14 DIAGNOSIS — F0391 Unspecified dementia with behavioral disturbance: Secondary | ICD-10-CM | POA: Diagnosis not present

## 2016-03-14 DIAGNOSIS — F23 Brief psychotic disorder: Secondary | ICD-10-CM | POA: Diagnosis not present

## 2016-03-14 DIAGNOSIS — E119 Type 2 diabetes mellitus without complications: Secondary | ICD-10-CM | POA: Diagnosis not present

## 2016-03-15 DIAGNOSIS — F0391 Unspecified dementia with behavioral disturbance: Secondary | ICD-10-CM | POA: Diagnosis not present

## 2016-03-15 DIAGNOSIS — Z9181 History of falling: Secondary | ICD-10-CM | POA: Diagnosis not present

## 2016-03-15 DIAGNOSIS — E119 Type 2 diabetes mellitus without complications: Secondary | ICD-10-CM | POA: Diagnosis not present

## 2016-03-15 DIAGNOSIS — F23 Brief psychotic disorder: Secondary | ICD-10-CM | POA: Diagnosis not present

## 2016-03-15 DIAGNOSIS — I10 Essential (primary) hypertension: Secondary | ICD-10-CM | POA: Diagnosis not present

## 2016-03-15 DIAGNOSIS — R2689 Other abnormalities of gait and mobility: Secondary | ICD-10-CM | POA: Diagnosis not present

## 2016-03-16 DIAGNOSIS — E119 Type 2 diabetes mellitus without complications: Secondary | ICD-10-CM | POA: Diagnosis not present

## 2016-03-16 DIAGNOSIS — F0391 Unspecified dementia with behavioral disturbance: Secondary | ICD-10-CM | POA: Diagnosis not present

## 2016-03-16 DIAGNOSIS — Z9181 History of falling: Secondary | ICD-10-CM | POA: Diagnosis not present

## 2016-03-16 DIAGNOSIS — R2689 Other abnormalities of gait and mobility: Secondary | ICD-10-CM | POA: Diagnosis not present

## 2016-03-16 DIAGNOSIS — I10 Essential (primary) hypertension: Secondary | ICD-10-CM | POA: Diagnosis not present

## 2016-03-16 DIAGNOSIS — F23 Brief psychotic disorder: Secondary | ICD-10-CM | POA: Diagnosis not present

## 2016-03-19 DIAGNOSIS — Z23 Encounter for immunization: Secondary | ICD-10-CM | POA: Diagnosis not present

## 2016-03-20 DIAGNOSIS — Z23 Encounter for immunization: Secondary | ICD-10-CM | POA: Diagnosis not present

## 2016-03-21 DIAGNOSIS — Z23 Encounter for immunization: Secondary | ICD-10-CM | POA: Diagnosis not present

## 2016-03-23 DIAGNOSIS — E039 Hypothyroidism, unspecified: Secondary | ICD-10-CM | POA: Diagnosis not present

## 2016-04-12 ENCOUNTER — Non-Acute Institutional Stay (SKILLED_NURSING_FACILITY): Payer: Medicare Other | Admitting: Internal Medicine

## 2016-04-12 ENCOUNTER — Encounter: Payer: Self-pay | Admitting: Internal Medicine

## 2016-04-12 DIAGNOSIS — E039 Hypothyroidism, unspecified: Secondary | ICD-10-CM | POA: Diagnosis not present

## 2016-04-12 DIAGNOSIS — F0391 Unspecified dementia with behavioral disturbance: Secondary | ICD-10-CM

## 2016-04-12 DIAGNOSIS — I10 Essential (primary) hypertension: Secondary | ICD-10-CM | POA: Diagnosis not present

## 2016-04-12 DIAGNOSIS — N189 Chronic kidney disease, unspecified: Secondary | ICD-10-CM | POA: Diagnosis not present

## 2016-04-12 DIAGNOSIS — E1129 Type 2 diabetes mellitus with other diabetic kidney complication: Secondary | ICD-10-CM

## 2016-04-12 DIAGNOSIS — F03918 Unspecified dementia, unspecified severity, with other behavioral disturbance: Secondary | ICD-10-CM | POA: Insufficient documentation

## 2016-04-12 DIAGNOSIS — R7303 Prediabetes: Secondary | ICD-10-CM | POA: Insufficient documentation

## 2016-04-12 NOTE — Assessment & Plan Note (Signed)
Hemoglobin A1c BMET

## 2016-04-12 NOTE — Assessment & Plan Note (Signed)
TSH 

## 2016-04-12 NOTE — Assessment & Plan Note (Signed)
On no antihypertensive at this time She'll be monitored and medication initiated if blood pressure averages greater than 140

## 2016-04-12 NOTE — Progress Notes (Signed)
   [facility]  Room: 226B  Dr. Marga MelnickWilliam Hopper [address] [place] [id number]  Chief Complaint  Patient presents with  . New Admit To SNF    [facility] Ad[facility]    This is a comprehensive admission note to [facility] performed on this date less than 30 days from date of admission. Included are preadmission medical/surgical history;reconciled medication list; family history; social history and comprehensive review of systems.  Corrections and additions to the records were documented . Comprehensive physical exam was also performed. Additionally a clinical summary was entered for each active diagnosis pertinent to this admission in the Problem List to enhance continuity of care.  PCP: Previously Dr Blima LedgerJoel Bass ,[facility], [place], [place]  HPI: The only information on the chart at this time is an FL 2 form from the prior skilled nursing facility [place]. The patient is unable to give any meaningful history due to severe, advanced dementia. [facility] she has unspecified dementia with behavioral disturbance, hypertension, type 2 diabetes, chronic kidney disease, and major depressive disorder. She is on the atypical psychotropics Abilify and Mirtazapine  as well as Aricept, Bupropion, Depakote,& trazodone  Past medical and surgical history: available, will be requested  Social history: Not available  Family history: Not available  Review of systems:Could not be completed due to dementia. She was unable to give the date or the president. She did know her date of birth and was in able to identify Obama as the last president. Her only complaints are dry mouth and urinary frequency. The latter complaint was repeated several times during the interview.  Physical exam:  Pertinent or positive findings: The most striking finding is is that she appears much younger than her stated age. She has ptosis greater on the  left than the right.  S4 is present. Breath sounds are decreased. Pedal pulses are decreased. Severe dementia as noted. She became somewhat emotionally uncomfortable during the exam, chaperone Vikki Ports(Valerie) was present during the examination  General appearance:Adequately nourished; no acute distress , increased work of breathing is present.   Lymphatic: No lymphadenopathy about the head, neck, axilla  Eyes: No conjunctival inflammation or lid edema is present. There is no scleral icterus. Ears:  External ear exam shows no significant lesions or deformities.   Nose:  External nasal examination shows no deformity or inflammation. Nasal mucosa are pink and moist without lesions ,exudates Oral exam: lips and gums are healthy appearing.There is no oropharyngeal erythema or exudate . Neck:  No thyromegaly, masses, tenderness noted.    Heart:  Normal rate and regular rhythm. S1 and S2 normal without gallop, murmur, click, rub .  Lungs:Chest clear to auscultation without wheezes, rhonchi,rales , rubs. Abdomen:Bowel sounds are normal. Abdomen is soft and nontender with no organomegaly, hernias,masses. GU: deferred as previously addressed. Extremities:  No cyanosis, clubbing,edema  Neurologic exam : Strength equal  in upper & lower extremities Balance,Rhomberg,finger to nose testing could not be completed due to clinical state Deep tendon reflexes are equal Skin: Warm & dry w/o tenting. No significant lesions or rash.  See clinical summary under each active problem in the Problem List with associated updated therapeutic plan

## 2016-04-12 NOTE — Assessment & Plan Note (Signed)
BMET CBC

## 2016-04-12 NOTE — Patient Instructions (Signed)
See specific orders & goals under each diagnosis 

## 2016-04-12 NOTE — Assessment & Plan Note (Signed)
Psychiatry consultation to assess present regimen

## 2016-04-13 ENCOUNTER — Encounter: Payer: Self-pay | Admitting: Internal Medicine

## 2016-04-13 DIAGNOSIS — I10 Essential (primary) hypertension: Secondary | ICD-10-CM | POA: Diagnosis not present

## 2016-04-13 DIAGNOSIS — R946 Abnormal results of thyroid function studies: Secondary | ICD-10-CM | POA: Diagnosis not present

## 2016-04-13 DIAGNOSIS — D509 Iron deficiency anemia, unspecified: Secondary | ICD-10-CM | POA: Diagnosis not present

## 2016-04-13 DIAGNOSIS — E1165 Type 2 diabetes mellitus with hyperglycemia: Secondary | ICD-10-CM | POA: Diagnosis not present

## 2016-04-17 LAB — BASIC METABOLIC PANEL
BUN: 25 mg/dL — AB (ref 4–21)
Creatinine: 1.2 mg/dL — AB (ref 0.5–1.1)
Glucose: 107 mg/dL
Potassium: 4.5 mmol/L (ref 3.4–5.3)
Sodium: 141 mmol/L (ref 137–147)

## 2016-04-17 LAB — HEMOGLOBIN A1C: HEMOGLOBIN A1C: 6

## 2016-04-17 LAB — TSH: TSH: 13.58 u[IU]/mL — AB (ref 0.41–5.90)

## 2016-04-17 LAB — CBC AND DIFFERENTIAL
HCT: 39 % (ref 36–46)
HEMOGLOBIN: 13.1 g/dL (ref 12.0–16.0)
NEUTROS ABS: 2058 /uL
PLATELETS: 226 10*3/uL (ref 150–399)
WBC: 4.9 10^3/mL

## 2016-04-18 DIAGNOSIS — N183 Chronic kidney disease, stage 3 (moderate): Secondary | ICD-10-CM | POA: Diagnosis not present

## 2016-04-18 DIAGNOSIS — M6281 Muscle weakness (generalized): Secondary | ICD-10-CM | POA: Diagnosis not present

## 2016-04-18 DIAGNOSIS — I129 Hypertensive chronic kidney disease with stage 1 through stage 4 chronic kidney disease, or unspecified chronic kidney disease: Secondary | ICD-10-CM | POA: Diagnosis not present

## 2016-04-19 DIAGNOSIS — M6281 Muscle weakness (generalized): Secondary | ICD-10-CM | POA: Diagnosis not present

## 2016-04-19 DIAGNOSIS — N183 Chronic kidney disease, stage 3 (moderate): Secondary | ICD-10-CM | POA: Diagnosis not present

## 2016-04-19 DIAGNOSIS — I129 Hypertensive chronic kidney disease with stage 1 through stage 4 chronic kidney disease, or unspecified chronic kidney disease: Secondary | ICD-10-CM | POA: Diagnosis not present

## 2016-04-20 ENCOUNTER — Encounter: Payer: Self-pay | Admitting: Nurse Practitioner

## 2016-04-20 ENCOUNTER — Non-Acute Institutional Stay (SKILLED_NURSING_FACILITY): Payer: Medicare Other | Admitting: Nurse Practitioner

## 2016-04-20 DIAGNOSIS — F329 Major depressive disorder, single episode, unspecified: Secondary | ICD-10-CM | POA: Diagnosis not present

## 2016-04-20 DIAGNOSIS — R634 Abnormal weight loss: Secondary | ICD-10-CM

## 2016-04-20 DIAGNOSIS — F0391 Unspecified dementia with behavioral disturbance: Secondary | ICD-10-CM | POA: Diagnosis not present

## 2016-04-20 DIAGNOSIS — N183 Chronic kidney disease, stage 3 (moderate): Secondary | ICD-10-CM | POA: Diagnosis not present

## 2016-04-20 DIAGNOSIS — M6281 Muscle weakness (generalized): Secondary | ICD-10-CM | POA: Diagnosis not present

## 2016-04-20 DIAGNOSIS — R309 Painful micturition, unspecified: Secondary | ICD-10-CM | POA: Diagnosis not present

## 2016-04-20 DIAGNOSIS — I129 Hypertensive chronic kidney disease with stage 1 through stage 4 chronic kidney disease, or unspecified chronic kidney disease: Secondary | ICD-10-CM | POA: Diagnosis not present

## 2016-04-20 DIAGNOSIS — F32A Depression, unspecified: Secondary | ICD-10-CM

## 2016-04-20 NOTE — Progress Notes (Signed)
Nursing Home Location:  [facility]  Place of Service: SNF (31)  PCP: Marga MelnickWilliam Hopper, MD  No Known Allergies  Chief Complaint  Patient presents with  . Acute Visit    Weight Loss    HPI:  Patient is a 81 y.o. female [facility] due to weight loss, pt with hx of dementia, CKD, depression, DM, dyslipidemia, hypothyroid, hypertension. Pt new to facility and has lost 5.4 lbs in 7 days.  Pt denies pain in mouth or trouble swallowing. Staff has not been assisting her to eat.  Pt sleeps late and stays in her room during breakfast.  No fevers or chills. No constipation noted by staff.  Review of Systems:  Review of Systems  Unable to perform ROS: Dementia    Past Medical History:  Diagnosis Date  . Chronic kidney disease   . Dementia with behavioral disturbance 03/19/2015   Notated on FL3 Form from Dr. Dreama SaaJoel Brass [phone]  . Depression    previously saw Dr Donell BeersPlovsky  . Diabetes mellitus without complication (HCC)    11/18/15 A1c 6%  . Dyslipidemia    11/18/15 Tg 74, HDL 60, LDL 90 on statin  . Hypertension   . Hypothyroidism    11/18/15 TSH 53.6, 02/07/16 TSH 9.42 @ [facility]  . Scabies    02/21/16 [facility],[place]; Rx: Permethrin cream  . Vitamin D deficiency    11/18/15 vitamin D 25-hydroxy 39   Past Surgical History:  Procedure Laterality Date  . no record     04/10/15 [facility] note: see old chart   Social History:   reports that she has been smoking.  She has never used smokeless tobacco. She reports that she does not drink alcohol or use drugs.  Family History  Problem Relation Age of Onset  . Family history unknown: Yes    Medications: Patient's Medications  New Prescriptions   No medications on file  Previous Medications   AMBULATORY NON FORMULARY MEDICATION    Calcium D 600-400 mg unit Sig: Take one tablet by mouth twice daily   ARIPIPRAZOLE (ABILIFY) 2 MG TABLET    Take 2 mg by mouth daily.   ASCORBIC ACID  (VITAMIN C) 500 MG TABLET    Take 500 mg by mouth daily.   ASPIRIN EC 81 MG TABLET    Take 81 mg by mouth daily.   ATORVASTATIN (LIPITOR) 40 MG TABLET    Take 40 mg by mouth daily.   BUPROPION (WELLBUTRIN XL) 300 MG 24 HR TABLET    Take 300 mg by mouth daily.   CHOLECALCIFEROL (VITAMIN D) 2000 UNITS TABLET    Take 2,000 Units by mouth daily.   DIVALPROEX (DEPAKOTE SPRINKLE) 125 MG CAPSULE    Take three capsules by mouth once daily   DONEPEZIL (ARICEPT) 5 MG TABLET    Take 5 mg by mouth at bedtime.   ESTRADIOL (ESTRACE) 0.1 MG/GM VAGINAL CREAM    Place 1 Applicatorful vaginally at bedtime. Every Monday, Wednesday and Friday   FUROSEMIDE (LASIX) 20 MG TABLET    Take 20 mg by mouth daily.   HYDROXYZINE (VISTARIL) 25 MG CAPSULE    Take 25 mg by mouth 2 (two) times daily.   LEVOTHYROXINE (SYNTHROID, LEVOTHROID) 125 MCG TABLET    Take 125 mcg by mouth daily before breakfast.   MIRTAZAPINE (REMERON SOL-TAB) 30 MG DISINTEGRATING TABLET    Take 30 mg by mouth at bedtime.   MULTIPLE VITAMIN (MULTI-VITAMIN DAILY PO)  Take one tablet by mouth once daily   POTASSIUM CHLORIDE (K-DUR) 10 MEQ TABLET    Take 10 mEq by mouth daily.   SENNOSIDES-DOCUSATE SODIUM (SENOKOT-S) 8.6-50 MG TABLET    Take 2 tablets by mouth daily.   TRAZODONE (DESYREL) 50 MG TABLET    Take 50 mg by mouth at bedtime.  Modified Medications   No medications on file  Discontinued Medications   No medications on file     Physical Exam: Vitals:   04/20/16 1037  BP: 125/82  Pulse: 68  Resp: 20  Temp: 97.2 F (36.2 C)  SpO2: 93%  Weight: 197 lb (89.4 kg)  Height: 5\' 8"  (1.727 m)    Physical Exam  Constitutional: She appears well-developed and well-nourished. No distress.  HENT:  Head: Normocephalic and atraumatic.  Mouth/Throat: Oropharynx is clear and moist. No oropharyngeal exudate.  Eyes: Conjunctivae are normal. Pupils are equal, round, and reactive to light.  Neck: Normal range of motion. Neck supple.  Cardiovascular:  Normal rate, regular rhythm and normal heart sounds.   Pulmonary/Chest: Effort normal and breath sounds normal.  Abdominal: Soft. Bowel sounds are normal.  Musculoskeletal: She exhibits no edema.  Neurological: She is alert.  Skin: Skin is warm and dry. She is not diaphoretic.  Psychiatric: Cognition and memory are impaired. She exhibits abnormal recent memory and abnormal remote memory.  Tearful during exam     Labs reviewed: Basic Metabolic Panel:  Recent Labs  40/98/1110/31/17  NA 141  K 4.5  BUN 25*  CREATININE 1.2*   Liver Function Tests: No results for input(s): AST, ALT, ALKPHOS, BILITOT, PROT, ALBUMIN in the last 8760 hours. No results for input(s): LIPASE, AMYLASE in the last 8760 hours. No results for input(s): AMMONIA in the last 8760 hours. CBC:  Recent Labs  04/17/16  WBC 4.9  NEUTROABS 2,058  HGB 13.1  HCT 39  PLT 226   TSH:  Recent Labs  04/17/16  TSH 13.58*   A1C: Lab Results  Component Value Date   HGBA1C 6.0 04/17/2016   Lipid Panel: No results for input(s): CHOL, HDL, LDLCALC, TRIG, CHOLHDL, LDLDIRECT in the last 8760 hours.   Assessment/Plan 1. Dementia with behavioral disturbance, unspecified dementia type Dementia noted, currently on aricept 5 mg daily, staff notes behaviors and resistant to care. Will increase aricept to 10 mg daily at this time.   2. Loss of weight Possible due to change and not being in a routine at new facility. Will have staff assist pt more with more prompting to eat and to bring to dining room for meals. restorative care to follow at this time for feeding cues  3. Depression, unspecified depression type -pt very tearful during exam. States she wants to go home to her mommy.  Pt conts on Wellbutrin and remeron.  If weight loss conts may consider decreasing remeron to 15 mg qhs to help with appetite  Adding namenda may also benefit pts mood and behaviors, if tolerating aricept increase will consider at next routine follow  up.    Janene HarveyJessica K. Biagio BorgEubanks, AGNP  [facility] [phone](Monday-Friday 8 am - 5 pm) [phone] (after hours)

## 2016-04-22 DIAGNOSIS — I129 Hypertensive chronic kidney disease with stage 1 through stage 4 chronic kidney disease, or unspecified chronic kidney disease: Secondary | ICD-10-CM | POA: Diagnosis not present

## 2016-04-22 DIAGNOSIS — N183 Chronic kidney disease, stage 3 (moderate): Secondary | ICD-10-CM | POA: Diagnosis not present

## 2016-04-22 DIAGNOSIS — M6281 Muscle weakness (generalized): Secondary | ICD-10-CM | POA: Diagnosis not present

## 2016-04-23 DIAGNOSIS — I129 Hypertensive chronic kidney disease with stage 1 through stage 4 chronic kidney disease, or unspecified chronic kidney disease: Secondary | ICD-10-CM | POA: Diagnosis not present

## 2016-04-23 DIAGNOSIS — M6281 Muscle weakness (generalized): Secondary | ICD-10-CM | POA: Diagnosis not present

## 2016-04-23 DIAGNOSIS — N183 Chronic kidney disease, stage 3 (moderate): Secondary | ICD-10-CM | POA: Diagnosis not present

## 2016-04-24 DIAGNOSIS — N183 Chronic kidney disease, stage 3 (moderate): Secondary | ICD-10-CM | POA: Diagnosis not present

## 2016-04-24 DIAGNOSIS — M6281 Muscle weakness (generalized): Secondary | ICD-10-CM | POA: Diagnosis not present

## 2016-04-24 DIAGNOSIS — I129 Hypertensive chronic kidney disease with stage 1 through stage 4 chronic kidney disease, or unspecified chronic kidney disease: Secondary | ICD-10-CM | POA: Diagnosis not present

## 2016-04-25 DIAGNOSIS — N183 Chronic kidney disease, stage 3 (moderate): Secondary | ICD-10-CM | POA: Diagnosis not present

## 2016-04-25 DIAGNOSIS — L100 Pemphigus vulgaris: Secondary | ICD-10-CM | POA: Diagnosis not present

## 2016-04-25 DIAGNOSIS — I129 Hypertensive chronic kidney disease with stage 1 through stage 4 chronic kidney disease, or unspecified chronic kidney disease: Secondary | ICD-10-CM | POA: Diagnosis not present

## 2016-04-25 DIAGNOSIS — M6281 Muscle weakness (generalized): Secondary | ICD-10-CM | POA: Diagnosis not present

## 2016-04-25 LAB — BASIC METABOLIC PANEL
BUN: 22 mg/dL — AB (ref 4–21)
CREATININE: 1.2 mg/dL — AB (ref 0.5–1.1)
Glucose: 112 mg/dL
POTASSIUM: 4.3 mmol/L (ref 3.4–5.3)
Sodium: 139 mmol/L (ref 137–147)

## 2016-04-26 DIAGNOSIS — M6281 Muscle weakness (generalized): Secondary | ICD-10-CM | POA: Diagnosis not present

## 2016-04-26 DIAGNOSIS — I129 Hypertensive chronic kidney disease with stage 1 through stage 4 chronic kidney disease, or unspecified chronic kidney disease: Secondary | ICD-10-CM | POA: Diagnosis not present

## 2016-04-26 DIAGNOSIS — N183 Chronic kidney disease, stage 3 (moderate): Secondary | ICD-10-CM | POA: Diagnosis not present

## 2016-04-27 DIAGNOSIS — N183 Chronic kidney disease, stage 3 (moderate): Secondary | ICD-10-CM | POA: Diagnosis not present

## 2016-04-27 DIAGNOSIS — M6281 Muscle weakness (generalized): Secondary | ICD-10-CM | POA: Diagnosis not present

## 2016-04-27 DIAGNOSIS — I129 Hypertensive chronic kidney disease with stage 1 through stage 4 chronic kidney disease, or unspecified chronic kidney disease: Secondary | ICD-10-CM | POA: Diagnosis not present

## 2016-04-30 DIAGNOSIS — I129 Hypertensive chronic kidney disease with stage 1 through stage 4 chronic kidney disease, or unspecified chronic kidney disease: Secondary | ICD-10-CM | POA: Diagnosis not present

## 2016-04-30 DIAGNOSIS — N183 Chronic kidney disease, stage 3 (moderate): Secondary | ICD-10-CM | POA: Diagnosis not present

## 2016-04-30 DIAGNOSIS — M6281 Muscle weakness (generalized): Secondary | ICD-10-CM | POA: Diagnosis not present

## 2016-05-01 DIAGNOSIS — I129 Hypertensive chronic kidney disease with stage 1 through stage 4 chronic kidney disease, or unspecified chronic kidney disease: Secondary | ICD-10-CM | POA: Diagnosis not present

## 2016-05-01 DIAGNOSIS — M6281 Muscle weakness (generalized): Secondary | ICD-10-CM | POA: Diagnosis not present

## 2016-05-01 DIAGNOSIS — N183 Chronic kidney disease, stage 3 (moderate): Secondary | ICD-10-CM | POA: Diagnosis not present

## 2016-05-02 DIAGNOSIS — M6281 Muscle weakness (generalized): Secondary | ICD-10-CM | POA: Diagnosis not present

## 2016-05-02 DIAGNOSIS — N183 Chronic kidney disease, stage 3 (moderate): Secondary | ICD-10-CM | POA: Diagnosis not present

## 2016-05-02 DIAGNOSIS — I129 Hypertensive chronic kidney disease with stage 1 through stage 4 chronic kidney disease, or unspecified chronic kidney disease: Secondary | ICD-10-CM | POA: Diagnosis not present

## 2016-05-03 DIAGNOSIS — N183 Chronic kidney disease, stage 3 (moderate): Secondary | ICD-10-CM | POA: Diagnosis not present

## 2016-05-03 DIAGNOSIS — G309 Alzheimer's disease, unspecified: Secondary | ICD-10-CM | POA: Diagnosis not present

## 2016-05-03 DIAGNOSIS — F339 Major depressive disorder, recurrent, unspecified: Secondary | ICD-10-CM | POA: Diagnosis not present

## 2016-05-03 DIAGNOSIS — M6281 Muscle weakness (generalized): Secondary | ICD-10-CM | POA: Diagnosis not present

## 2016-05-03 DIAGNOSIS — I129 Hypertensive chronic kidney disease with stage 1 through stage 4 chronic kidney disease, or unspecified chronic kidney disease: Secondary | ICD-10-CM | POA: Diagnosis not present

## 2016-05-03 DIAGNOSIS — F39 Unspecified mood [affective] disorder: Secondary | ICD-10-CM | POA: Diagnosis not present

## 2016-05-03 DIAGNOSIS — F0280 Dementia in other diseases classified elsewhere without behavioral disturbance: Secondary | ICD-10-CM | POA: Diagnosis not present

## 2016-05-04 DIAGNOSIS — I129 Hypertensive chronic kidney disease with stage 1 through stage 4 chronic kidney disease, or unspecified chronic kidney disease: Secondary | ICD-10-CM | POA: Diagnosis not present

## 2016-05-04 DIAGNOSIS — N183 Chronic kidney disease, stage 3 (moderate): Secondary | ICD-10-CM | POA: Diagnosis not present

## 2016-05-04 DIAGNOSIS — M6281 Muscle weakness (generalized): Secondary | ICD-10-CM | POA: Diagnosis not present

## 2016-05-05 DIAGNOSIS — M6281 Muscle weakness (generalized): Secondary | ICD-10-CM | POA: Diagnosis not present

## 2016-05-05 DIAGNOSIS — I129 Hypertensive chronic kidney disease with stage 1 through stage 4 chronic kidney disease, or unspecified chronic kidney disease: Secondary | ICD-10-CM | POA: Diagnosis not present

## 2016-05-05 DIAGNOSIS — N183 Chronic kidney disease, stage 3 (moderate): Secondary | ICD-10-CM | POA: Diagnosis not present

## 2016-05-06 DIAGNOSIS — M6281 Muscle weakness (generalized): Secondary | ICD-10-CM | POA: Diagnosis not present

## 2016-05-06 DIAGNOSIS — N183 Chronic kidney disease, stage 3 (moderate): Secondary | ICD-10-CM | POA: Diagnosis not present

## 2016-05-06 DIAGNOSIS — I129 Hypertensive chronic kidney disease with stage 1 through stage 4 chronic kidney disease, or unspecified chronic kidney disease: Secondary | ICD-10-CM | POA: Diagnosis not present

## 2016-05-07 DIAGNOSIS — I129 Hypertensive chronic kidney disease with stage 1 through stage 4 chronic kidney disease, or unspecified chronic kidney disease: Secondary | ICD-10-CM | POA: Diagnosis not present

## 2016-05-07 DIAGNOSIS — M6281 Muscle weakness (generalized): Secondary | ICD-10-CM | POA: Diagnosis not present

## 2016-05-07 DIAGNOSIS — N183 Chronic kidney disease, stage 3 (moderate): Secondary | ICD-10-CM | POA: Diagnosis not present

## 2016-05-08 DIAGNOSIS — N183 Chronic kidney disease, stage 3 (moderate): Secondary | ICD-10-CM | POA: Diagnosis not present

## 2016-05-08 DIAGNOSIS — I129 Hypertensive chronic kidney disease with stage 1 through stage 4 chronic kidney disease, or unspecified chronic kidney disease: Secondary | ICD-10-CM | POA: Diagnosis not present

## 2016-05-08 DIAGNOSIS — M6281 Muscle weakness (generalized): Secondary | ICD-10-CM | POA: Diagnosis not present

## 2016-05-09 DIAGNOSIS — N183 Chronic kidney disease, stage 3 (moderate): Secondary | ICD-10-CM | POA: Diagnosis not present

## 2016-05-09 DIAGNOSIS — M6281 Muscle weakness (generalized): Secondary | ICD-10-CM | POA: Diagnosis not present

## 2016-05-09 DIAGNOSIS — I129 Hypertensive chronic kidney disease with stage 1 through stage 4 chronic kidney disease, or unspecified chronic kidney disease: Secondary | ICD-10-CM | POA: Diagnosis not present

## 2016-05-10 DIAGNOSIS — N183 Chronic kidney disease, stage 3 (moderate): Secondary | ICD-10-CM | POA: Diagnosis not present

## 2016-05-10 DIAGNOSIS — I129 Hypertensive chronic kidney disease with stage 1 through stage 4 chronic kidney disease, or unspecified chronic kidney disease: Secondary | ICD-10-CM | POA: Diagnosis not present

## 2016-05-10 DIAGNOSIS — M6281 Muscle weakness (generalized): Secondary | ICD-10-CM | POA: Diagnosis not present

## 2016-05-11 DIAGNOSIS — I129 Hypertensive chronic kidney disease with stage 1 through stage 4 chronic kidney disease, or unspecified chronic kidney disease: Secondary | ICD-10-CM | POA: Diagnosis not present

## 2016-05-11 DIAGNOSIS — N183 Chronic kidney disease, stage 3 (moderate): Secondary | ICD-10-CM | POA: Diagnosis not present

## 2016-05-11 DIAGNOSIS — M6281 Muscle weakness (generalized): Secondary | ICD-10-CM | POA: Diagnosis not present

## 2016-05-12 DIAGNOSIS — I129 Hypertensive chronic kidney disease with stage 1 through stage 4 chronic kidney disease, or unspecified chronic kidney disease: Secondary | ICD-10-CM | POA: Diagnosis not present

## 2016-05-12 DIAGNOSIS — M6281 Muscle weakness (generalized): Secondary | ICD-10-CM | POA: Diagnosis not present

## 2016-05-12 DIAGNOSIS — N183 Chronic kidney disease, stage 3 (moderate): Secondary | ICD-10-CM | POA: Diagnosis not present

## 2016-05-14 DIAGNOSIS — I129 Hypertensive chronic kidney disease with stage 1 through stage 4 chronic kidney disease, or unspecified chronic kidney disease: Secondary | ICD-10-CM | POA: Diagnosis not present

## 2016-05-14 DIAGNOSIS — M6281 Muscle weakness (generalized): Secondary | ICD-10-CM | POA: Diagnosis not present

## 2016-05-14 DIAGNOSIS — N183 Chronic kidney disease, stage 3 (moderate): Secondary | ICD-10-CM | POA: Diagnosis not present

## 2016-05-15 DIAGNOSIS — I129 Hypertensive chronic kidney disease with stage 1 through stage 4 chronic kidney disease, or unspecified chronic kidney disease: Secondary | ICD-10-CM | POA: Diagnosis not present

## 2016-05-15 DIAGNOSIS — N183 Chronic kidney disease, stage 3 (moderate): Secondary | ICD-10-CM | POA: Diagnosis not present

## 2016-05-15 DIAGNOSIS — M6281 Muscle weakness (generalized): Secondary | ICD-10-CM | POA: Diagnosis not present

## 2016-05-16 ENCOUNTER — Encounter: Payer: Self-pay | Admitting: Nurse Practitioner

## 2016-05-16 ENCOUNTER — Non-Acute Institutional Stay (SKILLED_NURSING_FACILITY): Payer: Medicare Other | Admitting: Nurse Practitioner

## 2016-05-16 DIAGNOSIS — F32A Depression, unspecified: Secondary | ICD-10-CM

## 2016-05-16 DIAGNOSIS — F0391 Unspecified dementia with behavioral disturbance: Secondary | ICD-10-CM

## 2016-05-16 DIAGNOSIS — R634 Abnormal weight loss: Secondary | ICD-10-CM

## 2016-05-16 DIAGNOSIS — M6281 Muscle weakness (generalized): Secondary | ICD-10-CM | POA: Diagnosis not present

## 2016-05-16 DIAGNOSIS — F329 Major depressive disorder, single episode, unspecified: Secondary | ICD-10-CM

## 2016-05-16 DIAGNOSIS — I10 Essential (primary) hypertension: Secondary | ICD-10-CM | POA: Diagnosis not present

## 2016-05-16 DIAGNOSIS — E1129 Type 2 diabetes mellitus with other diabetic kidney complication: Secondary | ICD-10-CM

## 2016-05-16 DIAGNOSIS — E039 Hypothyroidism, unspecified: Secondary | ICD-10-CM

## 2016-05-16 DIAGNOSIS — N183 Chronic kidney disease, stage 3 (moderate): Secondary | ICD-10-CM | POA: Diagnosis not present

## 2016-05-16 DIAGNOSIS — I129 Hypertensive chronic kidney disease with stage 1 through stage 4 chronic kidney disease, or unspecified chronic kidney disease: Secondary | ICD-10-CM | POA: Diagnosis not present

## 2016-05-16 NOTE — Progress Notes (Signed)
Nursing Home Location:  [facility]  Place of Service: SNF (31)  PCP: Marga MelnickWilliam Hopper, MD  No Known Allergies  Chief Complaint  Patient presents with  . Medical Management of Chronic Issues    Routine Visit    HPI:  Patient is a 81 y.o. female [facility] for routine follow up on chronic conditions. Pt with hx of dementia, CKD, depression, DM, dyslipidemia, hypothyroid, hypertension. She has been followed by psych due to dementia with behaviors and depression. Recently Wellbutrin has been stopped and plan is to start a different antidepressant.  Behaviors have improved per nursing. No more combative behaviors since on namenda.  Weight has currently maintained.  Review of Systems:  Review of Systems  Unable to perform ROS: Dementia    Past Medical History:  Diagnosis Date  . Chronic kidney disease   . Dementia with behavioral disturbance 03/19/2015   Notated on FL3 Form from Dr. Dreama SaaJoel Brass [phone]  . Depression    previously saw Dr Donell BeersPlovsky  . Diabetes mellitus without complication (HCC)    11/18/15 A1c 6%  . Dyslipidemia    11/18/15 Tg 74, HDL 60, LDL 90 on statin  . Hypertension   . Hypothyroidism    11/18/15 TSH 53.6, 02/07/16 TSH 9.42 @ [facility]  . Scabies    02/21/16 [facility],[place]; Rx: Permethrin cream  . Vitamin D deficiency    11/18/15 vitamin D 25-hydroxy 39   Past Surgical History:  Procedure Laterality Date  . no record     04/10/15 [facility] note: see old chart   Social History:   reports that she has been smoking.  She has never used smokeless tobacco. She reports that she does not drink alcohol or use drugs.  Family History  Problem Relation Age of Onset  . Family history unknown: Yes    Medications: Patient's Medications  New Prescriptions   No medications on file  Previous Medications   AMBULATORY NON FORMULARY MEDICATION    Calcium D 600-400 mg unit Sig: Take one tablet by mouth twice daily   ARIPIPRAZOLE (ABILIFY) 2 MG TABLET    Take 2 mg by mouth daily.   ASCORBIC ACID (VITAMIN C) 500 MG TABLET    Take 500 mg by mouth daily.   ASPIRIN EC 81 MG TABLET    Take 81 mg by mouth daily.   ATORVASTATIN (LIPITOR) 40 MG TABLET    Take 40 mg by mouth daily.   CHOLECALCIFEROL (VITAMIN D) 2000 UNITS TABLET    Take 2,000 Units by mouth daily.   DIVALPROEX (DEPAKOTE SPRINKLE) 125 MG CAPSULE    Take three capsules by mouth once daily   DONEPEZIL (ARICEPT) 10 MG TABLET    Take 10 mg by mouth at bedtime.   ESTRADIOL (ESTRACE) 0.1 MG/GM VAGINAL CREAM    Place 1 Applicatorful vaginally at bedtime. Every Monday, Wednesday and Friday   FUROSEMIDE (LASIX) 20 MG TABLET    Take 20 mg by mouth daily.   HYDROXYZINE (VISTARIL) 25 MG CAPSULE    Take 25 mg by mouth 2 (two) times daily.   LEVOTHYROXINE (SYNTHROID, LEVOTHROID) 125 MCG TABLET    Take 125 mcg by mouth daily before breakfast.   MEMANTINE (NAMENDA) 5 MG TABLET    Take 5 mg by mouth 2 (two) times daily.   MIRTAZAPINE (REMERON SOL-TAB) 30 MG DISINTEGRATING TABLET    Take 30 mg by mouth at bedtime.   MULTIPLE VITAMIN (MULTI-VITAMIN DAILY PO)  Take one tablet by mouth once daily   POTASSIUM CHLORIDE (K-DUR) 10 MEQ TABLET    Take 10 mEq by mouth daily.   SENNOSIDES-DOCUSATE SODIUM (SENOKOT-S) 8.6-50 MG TABLET    Take 2 tablets by mouth daily.   TRAZODONE (DESYREL) 50 MG TABLET    Take 50 mg by mouth at bedtime.  Modified Medications   No medications on file  Discontinued Medications   BUPROPION (WELLBUTRIN XL) 300 MG 24 HR TABLET    Take 300 mg by mouth daily.   DONEPEZIL (ARICEPT) 5 MG TABLET    Take 5 mg by mouth at bedtime.     Physical Exam: Vitals:   05/16/16 1046  BP: (!) 100/58  Pulse: 68  Resp: 17  Temp: 97.6 F (36.4 C)  SpO2: 94%  Weight: 197 lb (89.4 kg)  Height: 5\' 8"  (1.727 m)    Physical Exam  Constitutional: She appears well-developed and well-nourished. No distress.  HENT:  Head: Normocephalic and atraumatic.    Mouth/Throat: Oropharynx is clear and moist. No oropharyngeal exudate.  Eyes: Conjunctivae are normal. Pupils are equal, round, and reactive to light.  Neck: Normal range of motion. Neck supple.  Cardiovascular: Normal rate, regular rhythm and normal heart sounds.   Pulmonary/Chest: Effort normal and breath sounds normal.  Abdominal: Soft. Bowel sounds are normal.  Musculoskeletal: She exhibits no edema.  Neurological: She is alert.  Skin: Skin is warm and dry. She is not diaphoretic.  Psychiatric: Cognition and memory are impaired. She exhibits abnormal recent memory and abnormal remote memory.    Labs reviewed: Basic Metabolic Panel:  Recent Labs  16/03/9609/31/17 04/25/16  NA 141 139  K 4.5 4.3  BUN 25* 22*  CREATININE 1.2* 1.2*   Liver Function Tests: No results for input(s): AST, ALT, ALKPHOS, BILITOT, PROT, ALBUMIN in the last 8760 hours. No results for input(s): LIPASE, AMYLASE in the last 8760 hours. No results for input(s): AMMONIA in the last 8760 hours. CBC:  Recent Labs  04/17/16  WBC 4.9  NEUTROABS 2,058  HGB 13.1  HCT 39  PLT 226   TSH:  Recent Labs  04/17/16  TSH 13.58*   A1C: Lab Results  Component Value Date   HGBA1C 6.0 04/17/2016   Lipid Panel: No results for input(s): CHOL, HDL, LDLCALC, TRIG, CHOLHDL, LDLDIRECT in the last 8760 hours.   Assessment/Plan 1. Dementia with behavioral disturbance, unspecified dementia type Advanced dementia, tolerating aricept and namenda at this time. Agitation has improved. Will cont current regimen.   2. Loss of weight Weight has maintained at this time. Will cont to monitor. conts on supplements   3. Depression, unspecified depression type Being managed by psych at this time. Wellbutrin has been stopped. Will follow up vit D level at this time   4. Type 2 diabetes mellitus with other diabetic kidney complication, without long-term current use of insulin (HCC) A1c stable, not currently on diabetic  medication  5. Hypothyroidism, unspecified type TSH elevated, will increase synthroid to 137 mcg at this time (has not been adjusted) and follow up TSH in 8 weeks  6. Hyperlipidemia Will follow up lipids at this time. conts on lipitor 40 mg daily   Keyarra Rendall K. Biagio BorgEubanks, AGNP  [facility] [phone](Monday-Friday 8 am - 5 pm) [phone] (after hours)

## 2016-05-17 DIAGNOSIS — N183 Chronic kidney disease, stage 3 (moderate): Secondary | ICD-10-CM | POA: Diagnosis not present

## 2016-05-17 DIAGNOSIS — E559 Vitamin D deficiency, unspecified: Secondary | ICD-10-CM | POA: Diagnosis not present

## 2016-05-17 DIAGNOSIS — E785 Hyperlipidemia, unspecified: Secondary | ICD-10-CM | POA: Diagnosis not present

## 2016-05-17 DIAGNOSIS — M6281 Muscle weakness (generalized): Secondary | ICD-10-CM | POA: Diagnosis not present

## 2016-05-17 DIAGNOSIS — F321 Major depressive disorder, single episode, moderate: Secondary | ICD-10-CM | POA: Diagnosis not present

## 2016-05-17 DIAGNOSIS — I129 Hypertensive chronic kidney disease with stage 1 through stage 4 chronic kidney disease, or unspecified chronic kidney disease: Secondary | ICD-10-CM | POA: Diagnosis not present

## 2016-05-17 LAB — VITAMIN D 25 HYDROXY (VIT D DEFICIENCY, FRACTURES): Vit D, 25-Hydroxy: 38.08

## 2016-05-17 LAB — LIPID PANEL
Cholesterol: 230 mg/dL — AB (ref 0–200)
HDL: 65 mg/dL (ref 35–70)
LDL CALC: 147 mg/dL
TRIGLYCERIDES: 91 mg/dL (ref 40–160)

## 2016-05-18 DIAGNOSIS — M6281 Muscle weakness (generalized): Secondary | ICD-10-CM | POA: Diagnosis not present

## 2016-05-18 DIAGNOSIS — R2681 Unsteadiness on feet: Secondary | ICD-10-CM | POA: Diagnosis not present

## 2016-05-18 DIAGNOSIS — I129 Hypertensive chronic kidney disease with stage 1 through stage 4 chronic kidney disease, or unspecified chronic kidney disease: Secondary | ICD-10-CM | POA: Diagnosis not present

## 2016-05-22 DIAGNOSIS — F0280 Dementia in other diseases classified elsewhere without behavioral disturbance: Secondary | ICD-10-CM | POA: Diagnosis not present

## 2016-05-22 DIAGNOSIS — G309 Alzheimer's disease, unspecified: Secondary | ICD-10-CM | POA: Diagnosis not present

## 2016-05-22 DIAGNOSIS — F339 Major depressive disorder, recurrent, unspecified: Secondary | ICD-10-CM | POA: Diagnosis not present

## 2016-05-22 DIAGNOSIS — F39 Unspecified mood [affective] disorder: Secondary | ICD-10-CM | POA: Diagnosis not present

## 2016-06-06 ENCOUNTER — Encounter: Payer: Self-pay | Admitting: Nurse Practitioner

## 2016-06-06 ENCOUNTER — Non-Acute Institutional Stay (SKILLED_NURSING_FACILITY): Payer: Medicare Other | Admitting: Nurse Practitioner

## 2016-06-06 DIAGNOSIS — E039 Hypothyroidism, unspecified: Secondary | ICD-10-CM | POA: Diagnosis not present

## 2016-06-06 DIAGNOSIS — R21 Rash and other nonspecific skin eruption: Secondary | ICD-10-CM | POA: Diagnosis not present

## 2016-06-06 DIAGNOSIS — F0391 Unspecified dementia with behavioral disturbance: Secondary | ICD-10-CM | POA: Diagnosis not present

## 2016-06-06 DIAGNOSIS — F329 Major depressive disorder, single episode, unspecified: Secondary | ICD-10-CM

## 2016-06-06 DIAGNOSIS — R634 Abnormal weight loss: Secondary | ICD-10-CM

## 2016-06-06 DIAGNOSIS — F32A Depression, unspecified: Secondary | ICD-10-CM

## 2016-06-06 NOTE — Progress Notes (Signed)
Nursing Home Location:  [facility]  Place of Service: SNF (31)  PCP: Marga MelnickWilliam Hopper, MD  No Known Allergies  Chief Complaint  Patient presents with  . Medical Management of Chronic Issues    Routine Visit    HPI:  Patient is a 81 y.o. female [facility] for routine follow up on chronic conditions. Pt with hx of dementia, CKD, depression, DM, dyslipidemia, hypothyroid, hypertension. She has been followed by psych due to dementia with behaviors and depression. Nursing reports behaviors and crying out burst are much better. Still with occasional agitation in the morning but can be easily redirected. Staff reports pt is scratching around ankles and complaints of itching.  No complaints of pain at this time. Weight has been stable since October.  No diarrhea or constipation reported.   Review of Systems:  Review of Systems  Unable to perform ROS: Dementia    Past Medical History:  Diagnosis Date  . Chronic kidney disease   . Dementia with behavioral disturbance 03/19/2015   Notated on FL3 Form from Dr. Dreama SaaJoel Brass [phone]#[id number]  . Depression    previously saw Dr Donell BeersPlovsky  . Diabetes mellitus without complication (HCC)    11/18/15 A1c 6%  . Dyslipidemia    11/18/15 Tg 74, HDL 60, LDL 90 on statin  . Hypertension   . Hypothyroidism    11/18/15 TSH 53.6, 02/07/16 TSH 9.42 @ [facility]  . Scabies    02/21/16 [facility],[place]; Rx: Permethrin cream  . Vitamin D deficiency    11/18/15 vitamin D 25-hydroxy 39   Past Surgical History:  Procedure Laterality Date  . no record     04/10/15 [facility] note: see old chart   Social History:   reports that she has quit smoking. She has never used smokeless tobacco. She reports that she does not drink alcohol or use drugs.  Family History  Problem Relation Age of Onset  . Family history unknown: Yes    Medications: Patient's Medications  New Prescriptions   No medications on file  Previous  Medications   ACETAMINOPHEN (TYLENOL) 650 MG CR TABLET    Take 650 mg by mouth every 6 (six) hours as needed for pain.   AMBULATORY NON FORMULARY MEDICATION    Calcium D 600-400 mg unit Sig: Take one tablet by mouth twice daily   ARIPIPRAZOLE (ABILIFY) 5 MG TABLET    Take 5 mg by mouth daily.   ASCORBIC ACID (VITAMIN C) 500 MG TABLET    Take 500 mg by mouth daily.   ASPIRIN EC 81 MG TABLET    Take 81 mg by mouth daily.   ATORVASTATIN (LIPITOR) 40 MG TABLET    Take 40 mg by mouth daily.   CHOLECALCIFEROL (VITAMIN D) 2000 UNITS TABLET    Take 2,000 Units by mouth daily.   DIVALPROEX (DEPAKOTE SPRINKLE) 125 MG CAPSULE    Take three capsules by mouth once daily   DONEPEZIL (ARICEPT) 10 MG TABLET    Take 10 mg by mouth at bedtime.   ESTRADIOL (ESTRACE) 0.1 MG/GM VAGINAL CREAM    Place 1 Applicatorful vaginally at bedtime. Every Monday, Wednesday and Friday   FUROSEMIDE (LASIX) 20 MG TABLET    Take 20 mg by mouth daily.   HYDROXYZINE (VISTARIL) 25 MG CAPSULE    Take 25 mg by mouth 2 (two) times daily.   LEVOTHYROXINE (SYNTHROID, LEVOTHROID) 137 MCG TABLET    Take 137 mcg by mouth daily before breakfast.  MEMANTINE (NAMENDA) 5 MG TABLET    Take 5 mg by mouth 2 (two) times daily.   MIRTAZAPINE (REMERON SOL-TAB) 30 MG DISINTEGRATING TABLET    Take 30 mg by mouth at bedtime.   MULTIPLE VITAMIN (MULTI-VITAMIN DAILY PO)    Take one tablet by mouth once daily   POTASSIUM CHLORIDE (K-DUR) 10 MEQ TABLET    Take 10 mEq by mouth daily.   SENNOSIDES-DOCUSATE SODIUM (SENOKOT-S) 8.6-50 MG TABLET    Take 2 tablets by mouth daily.   TRAZODONE (DESYREL) 50 MG TABLET    Take 50 mg by mouth at bedtime.  Modified Medications   No medications on file  Discontinued Medications   ARIPIPRAZOLE (ABILIFY) 2 MG TABLET    Take 2 mg by mouth daily.   LEVOTHYROXINE (SYNTHROID, LEVOTHROID) 125 MCG TABLET    Take 125 mcg by mouth daily before breakfast.     Physical Exam: Vitals:   06/06/16 1443  BP: 132/65  Pulse: 80    Resp: 20  Temp: 97.6 F (36.4 C)  SpO2: 90%  Weight: 196 lb (88.9 kg)  Height: 5\' 8"  (1.727 m)    Physical Exam  Constitutional: She appears well-developed and well-nourished. No distress.  HENT:  Head: Normocephalic and atraumatic.  Mouth/Throat: Oropharynx is clear and moist. No oropharyngeal exudate.  Eyes: Conjunctivae are normal. Pupils are equal, round, and reactive to light.  Neck: Normal range of motion. Neck supple.  Cardiovascular: Normal rate, regular rhythm and normal heart sounds.   Pulmonary/Chest: Effort normal and breath sounds normal.  Abdominal: Soft. Bowel sounds are normal.  Musculoskeletal: She exhibits no edema.  Neurological: She is alert.  Skin: Skin is warm and dry. She is not diaphoretic.  Dark patches to bilateral ankle that pt reports are itchy  Psychiatric: Cognition and memory are impaired. She exhibits abnormal recent memory and abnormal remote memory.    Labs reviewed: Basic Metabolic Panel:  Recent Labs  95/63/8710/31/17 04/25/16  NA 141 139  K 4.5 4.3  BUN 25* 22*  CREATININE 1.2* 1.2*   Liver Function Tests: No results for input(s): AST, ALT, ALKPHOS, BILITOT, PROT, ALBUMIN in the last 8760 hours. No results for input(s): LIPASE, AMYLASE in the last 8760 hours. No results for input(s): AMMONIA in the last 8760 hours. CBC:  Recent Labs  04/17/16  WBC 4.9  NEUTROABS 2,058  HGB 13.1  HCT 39  PLT 226   TSH:  Recent Labs  04/17/16  TSH 13.58*   A1C: Lab Results  Component Value Date   HGBA1C 6.0 04/17/2016   Lipid Panel: No results for input(s): CHOL, HDL, LDLCALC, TRIG, CHOLHDL, LDLDIRECT in the last 8760 hours.   Assessment/Plan 1. Rash and nonspecific skin eruption triamcinolone 0/1% cream to ankles BID until resolved   2. Dementia with behavioral disturbance, unspecified dementia type Stable, no acute decline in cognitive or functional status in the last month. conts on aricept and namenda.   3. Depression, unspecified  depression type Mood and behaviors have improved, cont on current regimen. Will follow up vit D level   4. Loss of weight Remains stable at this time.   5. Hypothyroidism -TSH elevated, synthroid has been increased to 137 mcg, will follow TSH  Sedra Morfin K. Biagio BorgEubanks, AGNP  [facility] [phone](Monday-Friday 8 am - 5 pm) [phone] (after hours)

## 2016-06-07 LAB — TSH: TSH: 5.99 u[IU]/mL — AB (ref 0.41–5.90)

## 2016-06-07 LAB — VITAMIN D 25 HYDROXY (VIT D DEFICIENCY, FRACTURES): VIT D 25 HYDROXY: 28.52

## 2016-06-09 DIAGNOSIS — E559 Vitamin D deficiency, unspecified: Secondary | ICD-10-CM | POA: Diagnosis not present

## 2016-06-09 DIAGNOSIS — E039 Hypothyroidism, unspecified: Secondary | ICD-10-CM | POA: Diagnosis not present

## 2016-06-09 LAB — TSH: TSH: 10.11 u[IU]/mL — AB (ref 0.41–5.90)

## 2016-06-09 LAB — VITAMIN D 25 HYDROXY (VIT D DEFICIENCY, FRACTURES): Vit D, 25-Hydroxy: 33.39

## 2016-06-27 ENCOUNTER — Non-Acute Institutional Stay (SKILLED_NURSING_FACILITY): Payer: Medicare Other | Admitting: Nurse Practitioner

## 2016-06-27 DIAGNOSIS — F32A Depression, unspecified: Secondary | ICD-10-CM

## 2016-06-27 DIAGNOSIS — F329 Major depressive disorder, single episode, unspecified: Secondary | ICD-10-CM | POA: Diagnosis not present

## 2016-06-27 DIAGNOSIS — R634 Abnormal weight loss: Secondary | ICD-10-CM | POA: Diagnosis not present

## 2016-06-27 DIAGNOSIS — E1129 Type 2 diabetes mellitus with other diabetic kidney complication: Secondary | ICD-10-CM | POA: Diagnosis not present

## 2016-06-27 DIAGNOSIS — F0391 Unspecified dementia with behavioral disturbance: Secondary | ICD-10-CM

## 2016-06-27 DIAGNOSIS — E039 Hypothyroidism, unspecified: Secondary | ICD-10-CM

## 2016-06-27 NOTE — Progress Notes (Signed)
Nursing Home Location:  [facility]  Place of Service: SNF (31)  PCP: Marga MelnickWilliam Hopper, MD  No Known Allergies  Chief Complaint  Patient presents with  . Medical Management of Chronic Issues    Routine Visit    HPI:  Patient is a 82 y.o. female [facility] for routine follow up on chronic conditions. Pt with hx of dementia, CKD, depression, DM, dyslipidemia, hypothyroid, hypertension. Pt has been doing well in the last month. Crying minimally after paxil had been started. [facility]. Sleeping well at night. No noted N/V/D eating well. Staff without concerns.   Review of Systems:  Review of Systems  Unable to perform ROS: Dementia    Past Medical History:  Diagnosis Date  . Chronic kidney disease   . Dementia with behavioral disturbance 03/19/2015   Notated on FL3 Form from Dr. Dreama SaaJoel Brass [id number]#[phone]  . Depression    previously saw Dr Donell BeersPlovsky  . Diabetes mellitus without complication (HCC)    11/18/15 A1c 6%  . Dyslipidemia    11/18/15 Tg 74, HDL 60, LDL 90 on statin  . Hypertension   . Hypothyroidism    11/18/15 TSH 53.6, 02/07/16 TSH 9.42 @ [facility]  . Scabies    02/21/16 [facility],[place]; Rx: Permethrin cream  . Vitamin D deficiency    11/18/15 vitamin D 25-hydroxy 39   Past Surgical History:  Procedure Laterality Date  . no record     04/10/15 [facility] note: see old chart   Social History:   reports that she has quit smoking. She has never used smokeless tobacco. She reports that she does not drink alcohol or use drugs.  Family History  Problem Relation Age of Onset  . Family history unknown: Yes    Medications: Patient's Medications  New Prescriptions   No medications on file  Previous Medications   ACETAMINOPHEN (TYLENOL) 650 MG CR TABLET    Take 650 mg by mouth every 6 (six) hours as needed for pain.   AMBULATORY NON FORMULARY MEDICATION    Calcium D 600-400 mg unit Sig: Take one tablet by  mouth twice daily   ARIPIPRAZOLE (ABILIFY) 5 MG TABLET    Take 5 mg by mouth daily.   ASCORBIC ACID (VITAMIN C) 500 MG TABLET    Take 500 mg by mouth daily.   ASPIRIN EC 81 MG TABLET    Take 81 mg by mouth daily.   ATORVASTATIN (LIPITOR) 40 MG TABLET    Take 40 mg by mouth daily.   CHOLECALCIFEROL (VITAMIN D) 2000 UNITS TABLET    Take 2,000 Units by mouth daily.   DIVALPROEX (DEPAKOTE SPRINKLE) 125 MG CAPSULE    Take three capsules by mouth once daily   DONEPEZIL (ARICEPT) 10 MG TABLET    Take 10 mg by mouth at bedtime.   ESTRADIOL (ESTRACE) 0.1 MG/GM VAGINAL CREAM    Place 1 Applicatorful vaginally at bedtime. Every Monday, Wednesday and Friday   FUROSEMIDE (LASIX) 20 MG TABLET    Take 20 mg by mouth daily.   HYDROXYZINE (VISTARIL) 25 MG CAPSULE    Take 25 mg by mouth 2 (two) times daily.   LEVOTHYROXINE (SYNTHROID, LEVOTHROID) 137 MCG TABLET    Take 137 mcg by mouth daily before breakfast.   MEMANTINE (NAMENDA) 5 MG TABLET    Take 5 mg by mouth 2 (two) times daily.   MIRTAZAPINE (REMERON) 15 MG TABLET    Take 15 mg  by mouth at bedtime.   MULTIPLE VITAMIN (MULTI-VITAMIN DAILY PO)    Take one tablet by mouth once daily   PAROXETINE (PAXIL) 10 MG TABLET    Take 10 mg by mouth at bedtime.   POTASSIUM CHLORIDE (K-DUR) 10 MEQ TABLET    Take 10 mEq by mouth daily.   SENNOSIDES-DOCUSATE SODIUM (SENOKOT-S) 8.6-50 MG TABLET    Take 2 tablets by mouth daily.   TRAZODONE (DESYREL) 50 MG TABLET    Take 50 mg by mouth at bedtime.  Modified Medications   No medications on file  Discontinued Medications   MIRTAZAPINE (REMERON SOL-TAB) 30 MG DISINTEGRATING TABLET    Take 30 mg by mouth at bedtime.     Physical Exam: Vitals:   06/27/16 1306  BP: 102/60  Pulse: 61  Resp: 20  Temp: 97.6 F (36.4 C)  SpO2: 98%  Weight: 200 lb 9.6 oz (91 kg)  Height: 5\' 8"  (1.727 m)    Physical Exam  Constitutional: She appears well-developed and well-nourished. No distress.  HENT:  Head: Normocephalic and  atraumatic.  Mouth/Throat: Oropharynx is clear and moist. No oropharyngeal exudate.  Eyes: Conjunctivae are normal. Pupils are equal, round, and reactive to light.  Neck: Normal range of motion. Neck supple.  Cardiovascular: Normal rate, regular rhythm and normal heart sounds.   Pulmonary/Chest: Effort normal and breath sounds normal.  Abdominal: Soft. Bowel sounds are normal.  Musculoskeletal: She exhibits no edema.  Neurological: She is alert.  Skin: Skin is warm and dry. She is not diaphoretic.  Dark patches to bilateral ankle   Psychiatric: Cognition and memory are impaired. She exhibits abnormal recent memory and abnormal remote memory.    Labs reviewed: Basic Metabolic Panel:  Recent Labs  21/30/86 04/25/16  NA 141 139  K 4.5 4.3  BUN 25* 22*  CREATININE 1.2* 1.2*   Liver Function Tests: No results for input(s): AST, ALT, ALKPHOS, BILITOT, PROT, ALBUMIN in the last 8760 hours. No results for input(s): LIPASE, AMYLASE in the last 8760 hours. No results for input(s): AMMONIA in the last 8760 hours. CBC:  Recent Labs  04/17/16  WBC 4.9  NEUTROABS 2,058  HGB 13.1  HCT 39  PLT 226   TSH:  Recent Labs  04/17/16  TSH 13.58*   A1C: Lab Results  Component Value Date   HGBA1C 6.0 04/17/2016   Lipid Panel: No results for input(s): CHOL, HDL, LDLCALC, TRIG, CHOLHDL, LDLDIRECT in the last 8760 hours.   Assessment/Plan 1. Hypothyroidism, unspecified type Currently on synthroid 137 mcg, will follow up TSH.   2. Loss of weight Has improved. conts on remeron and supplement.   3. Type 2 diabetes mellitus with other diabetic kidney complication, without long-term current use of insulin (HCC) Will follow up urine for microalbumin, consult for ophthalmology and podiatry  -A1c at goal, diet controlled.   4. Dementia with behavioral disturbance, unspecified dementia type Behaviors have improved. Will titrate namenda up to 10 mg BID and cont on aricept.   5.  Depression, unspecified depression type Mood has improved, conts on paxil per psych.   6. Vaginal atrophy Will dc estrace at this time and monitor.   Janene Harvey. Biagio Borg  [facility] [phone] 8 am - 5 pm) [phone] (after hours)

## 2016-06-28 DIAGNOSIS — E039 Hypothyroidism, unspecified: Secondary | ICD-10-CM | POA: Diagnosis not present

## 2016-06-28 LAB — TSH: TSH: 6.52 u[IU]/mL — AB (ref 0.41–5.90)

## 2016-06-29 LAB — MICROALBUMIN, URINE: Microalb, Ur: 2.3

## 2016-07-19 ENCOUNTER — Non-Acute Institutional Stay (SKILLED_NURSING_FACILITY): Payer: Medicare Other | Admitting: Internal Medicine

## 2016-07-19 ENCOUNTER — Encounter: Payer: Self-pay | Admitting: Internal Medicine

## 2016-07-19 DIAGNOSIS — L299 Pruritus, unspecified: Secondary | ICD-10-CM

## 2016-07-19 DIAGNOSIS — R400 Somnolence: Secondary | ICD-10-CM | POA: Diagnosis not present

## 2016-07-19 NOTE — Progress Notes (Signed)
   [facility] Room: 214 Full Code  Marga MelnickWilliam Hopper, MD  [address] [place] [id number]  This is a nursing facility follow up for specific acute issue of increased somnulence with increased dose of Namenda and rash exacerbation.  Interim medical record and care since last [facility] was updated with review of diagnostic studies and change in clinical status since last visit were documented.  HPI: The patient has been more somnolent in that she wants to stay in bed until later in the morning. She had exhibited emotional liability according to her nurse with spells of crying. Remeron was decreased in dosage and Paxil added with significant response in her mood.  She has been noted to be scratching her ankles. She has hydroxyzine ordered.There is no history of psoriasis or other dermatoses.  Review of systems: Patient unable to give date or name of the president; therefore, responses are questionable. Her only complaint is itching at the ankles. She has no other extrinsic symptoms. Constitutional: No fever,significant weight change, fatigue  Eyes: No redness, discharge, pain, vision change ENT/mouth: No nasal congestion,  purulent discharge, earache,change in hearing ,sore throat  Cardiovascular: No chest pain, palpitations,paroxysmal nocturnal dyspnea, claudication, edema  Respiratory: No cough, sputum production,hemoptysis, DOE , significant snoring,apnea  Gastrointestinal: No heartburn,dysphagia,abdominal pain, nausea / vomiting,rectal bleeding, melena,change in bowels Genitourinary: No dysuria,hematuria, pyuria,  incontinence, nocturia Dermatologic: pruritis as noted above Neurologic: No dizziness,headache,syncope, seizures, numbness , tingling Endocrine: No change in hair/ nails, excessive thirst, excessive hunger, excessive urination  Hematologic/lymphatic: No significant bruising, lymphadenopathy,abnormal bleeding Allergy/immunology: No itchy/  watery eyes, significant sneezing, urticaria, angioedema  Physical exam:  Pertinent or positive findings: Alert although not oriented as noted above. Grade 1/2 systolic murmur. Increased second heart sound. Minor rales at the bases. Decreased pedal pulses. The was evidence of dermatitis at the right ankle, but not the left. The skin was dry with localized bland hyperpigmentation greater on the lateral malleolar aspect. Excoriation lines were noted above the rash. She has no rash on the knees, elbows, or in the low back area.  General appearance:Adequately nourished; no acute distress , increased work of breathing is present.   Lymphatic: No lymphadenopathy about the head, neck, axilla . Eyes: No conjunctival inflammation or lid edema is present. There is no scleral icterus. Ears:  External ear exam shows no significant lesions or deformities.   Nose:  External nasal examination shows no deformity or inflammation. Nasal mucosa are pink and moist without lesions ,exudates Oral exam: lips and gums are healthy appearing.There is no oropharyngeal erythema or exudate . Neck:  No thyromegaly, masses, tenderness noted.    Heart:  Normal rate and regular rhythm. S1  normal without gallop, click, rub .  Lungs: without wheezes, rhonchi , rubs. Abdomen:Bowel sounds are normal. Abdomen is soft and nontender with no organomegaly, hernias,masses. GU: deferred  Extremities:  No cyanosis, clubbing,edema  Skin: Warm & dry w/o tenting.   Assessment/plan: #1 she's had some increase in somnolence but overall there has been a dramatic improvement in her affect with less emotional lability. Changes in her neuropsychiatric drugs will be deferred to Psychiatry, but hydroxyzine will be decreased to 10 mg every 12 hours when necessary as this is a sedating medication.  #2 pruritis with hyperpigmentation from excoriation. Eucerin will be applied twice a day. .Marland Kitchen

## 2016-07-19 NOTE — Patient Instructions (Signed)
See assessment and plan

## 2016-08-07 ENCOUNTER — Non-Acute Institutional Stay (SKILLED_NURSING_FACILITY): Payer: Medicare Other | Admitting: Nurse Practitioner

## 2016-08-07 DIAGNOSIS — F329 Major depressive disorder, single episode, unspecified: Secondary | ICD-10-CM | POA: Diagnosis not present

## 2016-08-07 DIAGNOSIS — F0391 Unspecified dementia with behavioral disturbance: Secondary | ICD-10-CM

## 2016-08-07 DIAGNOSIS — E039 Hypothyroidism, unspecified: Secondary | ICD-10-CM

## 2016-08-07 DIAGNOSIS — N189 Chronic kidney disease, unspecified: Secondary | ICD-10-CM | POA: Diagnosis not present

## 2016-08-07 DIAGNOSIS — M6281 Muscle weakness (generalized): Secondary | ICD-10-CM | POA: Diagnosis not present

## 2016-08-07 DIAGNOSIS — E1129 Type 2 diabetes mellitus with other diabetic kidney complication: Secondary | ICD-10-CM

## 2016-08-07 DIAGNOSIS — F32A Depression, unspecified: Secondary | ICD-10-CM

## 2016-08-07 DIAGNOSIS — I129 Hypertensive chronic kidney disease with stage 1 through stage 4 chronic kidney disease, or unspecified chronic kidney disease: Secondary | ICD-10-CM | POA: Diagnosis not present

## 2016-08-07 NOTE — Progress Notes (Signed)
Nursing Home Location:  [facility]  Place of Service: SNF (31)  PCP: Marga Melnick, MD  No Known Allergies  Chief Complaint  Patient presents with  . Medical Management of Chronic Issues    Routine Visit    HPI:  Patient is a 82 y.o. female seen today at [facility] for routine follow up on chronic conditions. Pt with hx of dementia, CKD, depression, DM, dyslipidemia, hypothyroid, hypertension. In the last month pt was seen due to increased somnolence and rash on ankle. Pt was started on namenda and titration to 10 mg BID done, pt with significant improvement in emotional lability. hydroxyzine was decreased to 10 mg q 12 hours and eurcerin added to apply to ankles for itch, which has showed improvement.  Staff without concern at this time.  Pt denies pain. Moving bowel and bladder without problems. Good appetite per staff, weight has been stable.   Review of Systems:  Review of Systems  Unable to perform ROS: Dementia    Past Medical History:  Diagnosis Date  . Chronic kidney disease   . Dementia with behavioral disturbance 03/19/2015   Notated on FL3 Form from Dr. Dreama Saa [phone]  . Depression    previously saw Dr Donell Beers  . Diabetes mellitus without complication (HCC)    11/18/15 A1c 6%  . Dyslipidemia    11/18/15 Tg 74, HDL 60, LDL 90 on statin  . Hypertension   . Hypothyroidism    11/18/15 TSH 53.6, 02/07/16 TSH 9.42 @ [facility]  . Scabies    02/21/16 [facility],[place]; Rx: Permethrin cream  . Vitamin D deficiency    11/18/15 vitamin D 25-hydroxy 39   Past Surgical History:  Procedure Laterality Date  . no record     04/10/15 [facility] note: see old chart   Social History:   reports that she has quit smoking. She has never used smokeless tobacco. She reports that she does not drink alcohol or use drugs.  Family History  Problem Relation Age of Onset  . Family history unknown: Yes    Medications: Patient's Medications  New  Prescriptions   No medications on file  Previous Medications   ACETAMINOPHEN (TYLENOL) 650 MG CR TABLET    Take 650 mg by mouth every 6 (six) hours as needed for pain.   AMBULATORY NON FORMULARY MEDICATION    Calcium D 600-400 mg unit Sig: Take one tablet by mouth twice daily   ARIPIPRAZOLE (ABILIFY) 5 MG TABLET    Take 5 mg by mouth daily.   ASCORBIC ACID (VITAMIN C) 500 MG TABLET    Take 500 mg by mouth daily.   ASPIRIN EC 81 MG TABLET    Take 81 mg by mouth daily.   ATORVASTATIN (LIPITOR) 40 MG TABLET    Take 40 mg by mouth daily.   CHOLECALCIFEROL (VITAMIN D) 2000 UNITS TABLET    Take 2,000 Units by mouth daily.   DIVALPROEX (DEPAKOTE SPRINKLE) 125 MG CAPSULE    Take three capsules by mouth twice daily   DONEPEZIL (ARICEPT) 10 MG TABLET    Take 10 mg by mouth at bedtime.   ESTRADIOL (ESTRACE) 0.1 MG/GM VAGINAL CREAM    Place 1 Applicatorful vaginally at bedtime. Every Monday, Wednesday and Friday   FUROSEMIDE (LASIX) 20 MG TABLET    Take 20 mg by mouth daily.   HYDROXYZINE (ATARAX/VISTARIL) 10 MG TABLET    Take 10 mg by mouth every 12 (twelve) hours.    LEVOTHYROXINE (  SYNTHROID, LEVOTHROID) 137 MCG TABLET    Take 137 mcg by mouth daily before breakfast.   MEMANTINE (NAMENDA) 10 MG TABLET    Take 10 mg by mouth 2 (two) times daily.   MIRTAZAPINE (REMERON) 15 MG TABLET    Take 15 mg by mouth at bedtime.   MULTIPLE VITAMIN (MULTI-VITAMIN DAILY PO)    Take one tablet by mouth once daily   PAROXETINE (PAXIL) 10 MG TABLET    Take 10 mg by mouth at bedtime.   POTASSIUM CHLORIDE (K-DUR) 10 MEQ TABLET    Take 10 mEq by mouth daily.   SENNOSIDES-DOCUSATE SODIUM (SENOKOT-S) 8.6-50 MG TABLET    Take 2 tablets by mouth daily.   SKIN PROTECTANTS, MISC. (EUCERIN) CREAM    Apply to ankles twice a day for dry skin.   TRAZODONE (DESYREL) 50 MG TABLET    Take 50 mg by mouth at bedtime.  Modified Medications   No medications on file  Discontinued Medications   MEMANTINE (NAMENDA) 5 MG TABLET    Take 5 mg  by mouth 2 (two) times daily.     Physical Exam: Vitals:   08/07/16 1528  BP: 140/70  Pulse: 83  Resp: 18  Temp: 97.4 F (36.3 C)  SpO2: 97%  Weight: 198 lb (89.8 kg)  Height: 5\' 8"  (1.727 m)    Physical Exam  Constitutional: She appears well-developed and well-nourished. No distress.  HENT:  Head: Normocephalic and atraumatic.  Mouth/Throat: Oropharynx is clear and moist. No oropharyngeal exudate.  Eyes: Conjunctivae are normal. Pupils are equal, round, and reactive to light.  Neck: Normal range of motion. Neck supple.  Cardiovascular: Normal rate, regular rhythm and normal heart sounds.   Pulmonary/Chest: Effort normal and breath sounds normal.  Abdominal: Soft. Bowel sounds are normal.  Musculoskeletal: She exhibits no edema.  Neurological: She is alert.  Skin: Skin is warm and dry. She is not diaphoretic.  Dark patches to bilateral ankle   Psychiatric: Cognition and memory are impaired. She exhibits abnormal recent memory and abnormal remote memory.    Labs reviewed: Basic Metabolic Panel:  Recent Labs  78/29/5610/31/17 04/25/16  NA 141 139  K 4.5 4.3  BUN 25* 22*  CREATININE 1.2* 1.2*   Liver Function Tests: No results for input(s): AST, ALT, ALKPHOS, BILITOT, PROT, ALBUMIN in the last 8760 hours. No results for input(s): LIPASE, AMYLASE in the last 8760 hours. No results for input(s): AMMONIA in the last 8760 hours. CBC:  Recent Labs  04/17/16  WBC 4.9  NEUTROABS 2,058  HGB 13.1  HCT 39  PLT 226   TSH:  Recent Labs  04/17/16 06/07/16 06/28/16  TSH 13.58* 5.99* 6.52*   A1C: Lab Results  Component Value Date   HGBA1C 6.0 04/17/2016   Lipid Panel:  Recent Labs  05/17/16  CHOL 230*  HDL 65  LDLCALC 147  TRIG 91     Assessment/Plan 1. Hypothyroidism, unspecified type TSH remains elevated but has improved, conts on synthroid 137 mcg, TSH follow up 08/27/16   2. Type 2 diabetes mellitus with other diabetic kidney complication, without  long-term current use of insulin (HCC) A1c at goal, not currently on medication.   3. Dementia with behavioral disturbance, unspecified dementia type Stable, conts on aricept and namenda, no acute changes in cognitive or behavioral status  4. Depression, unspecified depression type significantly improved, will cont current regimen.   5. Chronic kidney disease, unspecified CKD stage Will follow up labs in March with TSh Janene HarveyJessica K.  Biagio Borg  [facility] [phone] 8 am - 5 pm) [phone] (after hours)

## 2016-08-08 DIAGNOSIS — I129 Hypertensive chronic kidney disease with stage 1 through stage 4 chronic kidney disease, or unspecified chronic kidney disease: Secondary | ICD-10-CM | POA: Diagnosis not present

## 2016-08-08 DIAGNOSIS — M6281 Muscle weakness (generalized): Secondary | ICD-10-CM | POA: Diagnosis not present

## 2016-08-09 DIAGNOSIS — M6281 Muscle weakness (generalized): Secondary | ICD-10-CM | POA: Diagnosis not present

## 2016-08-09 DIAGNOSIS — I129 Hypertensive chronic kidney disease with stage 1 through stage 4 chronic kidney disease, or unspecified chronic kidney disease: Secondary | ICD-10-CM | POA: Diagnosis not present

## 2016-08-10 DIAGNOSIS — I129 Hypertensive chronic kidney disease with stage 1 through stage 4 chronic kidney disease, or unspecified chronic kidney disease: Secondary | ICD-10-CM | POA: Diagnosis not present

## 2016-08-10 DIAGNOSIS — M6281 Muscle weakness (generalized): Secondary | ICD-10-CM | POA: Diagnosis not present

## 2016-08-13 DIAGNOSIS — I129 Hypertensive chronic kidney disease with stage 1 through stage 4 chronic kidney disease, or unspecified chronic kidney disease: Secondary | ICD-10-CM | POA: Diagnosis not present

## 2016-08-13 DIAGNOSIS — M6281 Muscle weakness (generalized): Secondary | ICD-10-CM | POA: Diagnosis not present

## 2016-08-14 DIAGNOSIS — M6281 Muscle weakness (generalized): Secondary | ICD-10-CM | POA: Diagnosis not present

## 2016-08-14 DIAGNOSIS — I129 Hypertensive chronic kidney disease with stage 1 through stage 4 chronic kidney disease, or unspecified chronic kidney disease: Secondary | ICD-10-CM | POA: Diagnosis not present

## 2016-08-15 DIAGNOSIS — M6281 Muscle weakness (generalized): Secondary | ICD-10-CM | POA: Diagnosis not present

## 2016-08-15 DIAGNOSIS — I129 Hypertensive chronic kidney disease with stage 1 through stage 4 chronic kidney disease, or unspecified chronic kidney disease: Secondary | ICD-10-CM | POA: Diagnosis not present

## 2016-08-16 DIAGNOSIS — I129 Hypertensive chronic kidney disease with stage 1 through stage 4 chronic kidney disease, or unspecified chronic kidney disease: Secondary | ICD-10-CM | POA: Diagnosis not present

## 2016-08-16 DIAGNOSIS — M6281 Muscle weakness (generalized): Secondary | ICD-10-CM | POA: Diagnosis not present

## 2016-08-17 DIAGNOSIS — I129 Hypertensive chronic kidney disease with stage 1 through stage 4 chronic kidney disease, or unspecified chronic kidney disease: Secondary | ICD-10-CM | POA: Diagnosis not present

## 2016-08-17 DIAGNOSIS — M6281 Muscle weakness (generalized): Secondary | ICD-10-CM | POA: Diagnosis not present

## 2016-08-20 DIAGNOSIS — I129 Hypertensive chronic kidney disease with stage 1 through stage 4 chronic kidney disease, or unspecified chronic kidney disease: Secondary | ICD-10-CM | POA: Diagnosis not present

## 2016-08-20 DIAGNOSIS — M6281 Muscle weakness (generalized): Secondary | ICD-10-CM | POA: Diagnosis not present

## 2016-08-24 DIAGNOSIS — L089 Local infection of the skin and subcutaneous tissue, unspecified: Secondary | ICD-10-CM | POA: Diagnosis not present

## 2016-08-24 DIAGNOSIS — E119 Type 2 diabetes mellitus without complications: Secondary | ICD-10-CM | POA: Diagnosis not present

## 2016-08-27 DIAGNOSIS — E0590 Thyrotoxicosis, unspecified without thyrotoxic crisis or storm: Secondary | ICD-10-CM | POA: Diagnosis not present

## 2016-08-27 DIAGNOSIS — I10 Essential (primary) hypertension: Secondary | ICD-10-CM | POA: Diagnosis not present

## 2016-08-27 LAB — HEPATIC FUNCTION PANEL
ALK PHOS: 94 U/L (ref 25–125)
ALT: 22 U/L (ref 7–35)
AST: 30 U/L (ref 13–35)
Bilirubin, Total: 0.3 mg/dL

## 2016-08-27 LAB — BASIC METABOLIC PANEL
BUN: 20 mg/dL (ref 4–21)
CREATININE: 0.9 mg/dL (ref 0.5–1.1)
Glucose: 113 mg/dL
POTASSIUM: 4.2 mmol/L (ref 3.4–5.3)
SODIUM: 142 mmol/L (ref 137–147)

## 2016-08-27 LAB — TSH: TSH: 25.67 u[IU]/mL — AB (ref 0.41–5.90)

## 2016-08-28 ENCOUNTER — Encounter: Payer: Self-pay | Admitting: Internal Medicine

## 2016-08-28 NOTE — Progress Notes (Signed)
error    This encounter was created in error - please disregard.

## 2016-08-29 DIAGNOSIS — F39 Unspecified mood [affective] disorder: Secondary | ICD-10-CM | POA: Diagnosis not present

## 2016-08-29 DIAGNOSIS — G309 Alzheimer's disease, unspecified: Secondary | ICD-10-CM | POA: Diagnosis not present

## 2016-08-29 DIAGNOSIS — F0280 Dementia in other diseases classified elsewhere without behavioral disturbance: Secondary | ICD-10-CM | POA: Diagnosis not present

## 2016-08-29 DIAGNOSIS — F339 Major depressive disorder, recurrent, unspecified: Secondary | ICD-10-CM | POA: Diagnosis not present

## 2016-08-30 ENCOUNTER — Non-Acute Institutional Stay (SKILLED_NURSING_FACILITY): Payer: Medicare Other | Admitting: Internal Medicine

## 2016-08-30 ENCOUNTER — Encounter: Payer: Self-pay | Admitting: Internal Medicine

## 2016-08-30 DIAGNOSIS — E039 Hypothyroidism, unspecified: Secondary | ICD-10-CM

## 2016-08-30 NOTE — Progress Notes (Signed)
   [facility] Room: 214   PCP: Marga MelnickWilliam Gatlyn Lipari, MD [address] [place] [id number]    This is a nursing facility follow up for specific acute issue of abnormal TSH.  Interim medical record and care since last [facility] visit was updated with review of diagnostic studies and change in clinical status since last visit were documented.  HPI: She has a diagnosis of hypothyroidism and has had markedly variable TSHs. On 11/18/15 the TSH was 53.6, this was at another nursing facility. Follow-up 02/07/16 revealed a TSH of 9.42. TSH was 13.58 in October 2017 but responded to increase in L-thyroxine. It was mildly elevated in January of this year with a value of 6.52, but on 08/27/16 the TSH was 25.67 despite no change in her L-thyroxine dose.  Review of systems: Dementia invalidated responses. Oriented to self only.  Initially she stated she was having heart fluttering but later denied this. She does describe cold intolerance. She also has fatigue and states she has swelling of feet. Some itching of the skin.  Physical exam:  Pertinent or positive findings: Affect is flat, but she becomes anxious as she is interviewed. There is puffiness of the eyes and ptosis. The second heart sound is accentuated. She has one half-1+ pedal edema. She has a very faint tremor of the hands which is not pill rolling.  General appearance:Adequately nourished; no acute distress , increased work of breathing is present.   Lymphatic: No lymphadenopathy about the head, neck, axilla . Eyes: No conjunctival inflammation or lid edema is present. There is no scleral icterus. Ears:  External ear exam shows no significant lesions or deformities.   Nose:  External nasal examination shows no deformity or inflammation. Nasal mucosa are pink and moist without lesions ,exudates Oral exam: lips and gums are healthy appearing.There is no oropharyngeal erythema or exudate . Neck:  No thyromegaly,  masses, tenderness noted.    Heart:  Normal rate and regular rhythm. S1  normal without gallop, murmur, click, rub .  Lungs:Chest clear to auscultation without wheezes, rhonchi,rales , rubs. Abdomen:Bowel sounds are normal. Abdomen is soft and nontender with no organomegaly, hernias,masses. GU: deferred  Extremities:  No cyanosis, clubbing  Neurologic exam : Strength equal  in upper & lower extremities Balance,Rhomberg,finger to nose testing could not be completed due to clinical state Deep tendon reflexes are equal but 0-1/2+ Skin: Warm & dry w/o tenting. No significant lesions or rash.  See summary under  active problem in the Problem List with associated updated therapeutic plan

## 2016-08-30 NOTE — Assessment & Plan Note (Signed)
L-thyroxine 150 g at least 30 mins before breakfast without other medications; repeat TSH in 8 weeks

## 2016-08-30 NOTE — Patient Instructions (Signed)
See assessment and plan under diagnosis in the problem list  for this visit  

## 2016-09-10 ENCOUNTER — Non-Acute Institutional Stay (SKILLED_NURSING_FACILITY): Payer: Medicare Other | Admitting: Nurse Practitioner

## 2016-09-10 DIAGNOSIS — F0391 Unspecified dementia with behavioral disturbance: Secondary | ICD-10-CM | POA: Diagnosis not present

## 2016-09-10 DIAGNOSIS — E039 Hypothyroidism, unspecified: Secondary | ICD-10-CM | POA: Diagnosis not present

## 2016-09-10 DIAGNOSIS — N189 Chronic kidney disease, unspecified: Secondary | ICD-10-CM | POA: Diagnosis not present

## 2016-09-10 DIAGNOSIS — F339 Major depressive disorder, recurrent, unspecified: Secondary | ICD-10-CM | POA: Diagnosis not present

## 2016-09-10 DIAGNOSIS — E1129 Type 2 diabetes mellitus with other diabetic kidney complication: Secondary | ICD-10-CM | POA: Diagnosis not present

## 2016-09-10 NOTE — Progress Notes (Signed)
Nursing Home Location:  [facility]  [facility]  PCP: Marga MelnickWilliam Hopper, MD  No Known Allergies  Chief Complaint  Patient presents with  . Medical Management of Chronic Issues    Resident is being seen for routine visit.     HPI:  Patient is a 82 y.o. female [facility] for routine follow up on chronic conditions. Pt with hx of dementia, CKD, depression, DM, dyslipidemia, hypothyroid, hypertension. In the last month pt was seen due to fluctuations in her TSH levels with subsequent medication changes.Her last TSH was completed 3/12, equalling 25.67. Her levothyroxine was increased to 150 mcg PO daily. Her next TSH is due in 8 weeks. Today she has no complaints, although she is extremely tearful during our conversation. Her namenda was increased this month to 10mg  PO BID to help with emotional lability, although this may need to be re-evaluated. Pt denies pain. Moving bowel and bladder without problems. Good appetite per staff, weight has been stable.   Review of Systems:  Review of Systems  Unable to perform ROS: Dementia    Past Medical History:  Diagnosis Date  . Chronic kidney disease   . Dementia with behavioral disturbance 03/19/2015   Notated on FL3 Form from Dr. Dreama SaaJoel Brass [phone]  . Depression    previously saw Dr Donell BeersPlovsky  . Diabetes mellitus without complication (HCC)    11/18/15 A1c 6%  . Dyslipidemia    11/18/15 Tg 74, HDL 60, LDL 90 on statin  . Hypertension   . Hypothyroidism    11/18/15 TSH 53.6, 02/07/16 TSH 9.42 @ [facility]  . Scabies    02/21/16 [facility],[place]; Rx: Permethrin cream  . Vitamin D deficiency    11/18/15 vitamin D 25-hydroxy 39   Past Surgical History:  Procedure Laterality Date  . no record     04/10/15 [facility] note: see old chart   Social History:   reports that she has quit smoking. She has never used smokeless tobacco. She reports that she does not drink alcohol or use  drugs.  Family History  Problem Relation Age of Onset  . Family history unknown: Yes    Medications: Patient's Medications  New Prescriptions   No medications on file  Previous Medications   ACETAMINOPHEN (TYLENOL) 650 MG CR TABLET    Take 650 mg by mouth every 6 (six) hours as needed for pain.   AMBULATORY NON FORMULARY MEDICATION    Calcium D 600-400 mg unit Sig: Take one tablet by mouth twice daily   ARIPIPRAZOLE (ABILIFY) 5 MG TABLET    Take 5 mg by mouth daily.   ASCORBIC ACID (VITAMIN C) 500 MG TABLET    Take 500 mg by mouth daily.   ASPIRIN EC 81 MG TABLET    Take 81 mg by mouth daily.   ATORVASTATIN (LIPITOR) 40 MG TABLET    Take 40 mg by mouth daily.   CHOLECALCIFEROL (VITAMIN D) 2000 UNITS TABLET    Take 2,000 Units by mouth daily.   DIVALPROEX (DEPAKOTE SPRINKLE) 125 MG CAPSULE    Take three capsules by mouth twice daily   DONEPEZIL (ARICEPT) 10 MG TABLET    Take 10 mg by mouth at bedtime.   FUROSEMIDE (LASIX) 20 MG TABLET    Take 20 mg by mouth daily.   HYDROXYZINE (ATARAX/VISTARIL) 10 MG TABLET    Take 10 mg by mouth every 12 (twelve) hours.    LEVOTHYROXINE (SYNTHROID, LEVOTHROID) 150  MCG TABLET    Take 150 mcg by mouth daily before breakfast.   MEMANTINE (NAMENDA) 10 MG TABLET    Take 10 mg by mouth 2 (two) times daily.   MIRTAZAPINE (REMERON) 15 MG TABLET    Take 15 mg by mouth at bedtime.   MULTIPLE VITAMIN (MULTI-VITAMIN DAILY PO)    Take one tablet by mouth once daily   PAROXETINE (PAXIL) 10 MG TABLET    Take 10 mg by mouth at bedtime.   POTASSIUM CHLORIDE (K-DUR) 10 MEQ TABLET    Take 10 mEq by mouth daily.   SENNOSIDES-DOCUSATE SODIUM (SENOKOT-S) 8.6-50 MG TABLET    Take 2 tablets by mouth daily.   SKIN PROTECTANTS, MISC. (MINERIN) CREA    Apply topically to ankles twice a day for dry skin.   TRAZODONE (DESYREL) 50 MG TABLET    Take 50 mg by mouth at bedtime.  Modified Medications   No medications on file  Discontinued Medications   LEVOTHYROXINE (SYNTHROID,  LEVOTHROID) 137 MCG TABLET    Take 137 mcg by mouth daily before breakfast.     Physical Exam: Vitals:   09/10/16 1409  BP: 122/68  Pulse: 82  Resp: 18  Temp: 97.8 F (36.6 C)  SpO2: 94%  Weight: 205 lb 9.6 oz (93.3 kg)  Height: 5\' 8"  (1.727 m)    Physical Exam  Constitutional: She appears well-developed and well-nourished. No distress.  HENT:  Head: Normocephalic and atraumatic.  Mouth/Throat: Oropharynx is clear and moist. No oropharyngeal exudate.  Eyes: Conjunctivae are normal. Pupils are equal, round, and reactive to light.  Neck: Normal range of motion. Neck supple.  Cardiovascular: Normal rate, regular rhythm and normal heart sounds.   Pulmonary/Chest: Effort normal and breath sounds normal. No respiratory distress. She has no wheezes. She has no rales. She exhibits no tenderness.  Abdominal: Soft. Bowel sounds are normal. She exhibits no distension. There is no tenderness. There is no guarding.  Musculoskeletal: She exhibits no edema or tenderness.  Neurological: She is alert.  Oriented to time. Disoriented to place and self.   Skin: Skin is warm and dry. She is not diaphoretic.  Psychiatric: Her mood appears not anxious. Her affect is not labile. Her speech is not delayed. Cognition and memory are impaired. She exhibits abnormal recent memory and abnormal remote memory.  Admits to feelings of sadness    Labs reviewed: Basic Metabolic Panel:  Recent Labs  16/10/96 04/25/16 08/27/16  NA 141 139 142  K 4.5 4.3 4.2  BUN 25* 22* 20  CREATININE 1.2* 1.2* 0.9   Liver Function Tests:  Recent Labs  08/27/16  AST 30  ALT 22  ALKPHOS 94   No results for input(s): LIPASE, AMYLASE in the last 8760 hours. No results for input(s): AMMONIA in the last 8760 hours. CBC:  Recent Labs  04/17/16  WBC 4.9  NEUTROABS 2,058  HGB 13.1  HCT 39  PLT 226   TSH:  Recent Labs  06/09/16 06/28/16 08/27/16  TSH 10.11* 6.52* 25.67*   A1C: Lab Results  Component Value  Date   HGBA1C 6.0 04/17/2016   Lipid Panel:  Recent Labs  05/17/16  CHOL 230*  HDL 65  LDLCALC 147  TRIG 91     Assessment/Plan 1. Hypothyroidism, unspecified type TSH remains elevated. Dose was adjusted and will redraw lab in 8 weeks. Pt currently on PO QD with a TSH of 25.67  2. Type 2 diabetes mellitus with other diabetic kidney complication, without long-term  current use of insulin (HCC) A1c at goal, not currently on medication. Will refer for diabetic eye exam and podiatry referral.   3. Dementia with behavioral disturbance, unspecified dementia type Stable, conts on aricept and namenda, no acute changes in cognition, although behavior continues to be extremely labile.   4. Depression, unspecified depression type significantly improved according to staff, although continues to be tearful during our interview.  -synthroid recently adjusted, conts on paxil, remeron and trazodone. Will cont current regimen.   5. Chronic kidney disease, unspecified CKD stage -Kidney function is WNL as of 3/12  Alaysha Jefcoat K. Biagio Borg  [facility] [phone] 8 am - 5 pm) [phone] (after hours)

## 2016-10-03 ENCOUNTER — Encounter: Payer: Self-pay | Admitting: Nurse Practitioner

## 2016-10-03 ENCOUNTER — Non-Acute Institutional Stay (SKILLED_NURSING_FACILITY): Payer: Medicare Other | Admitting: Nurse Practitioner

## 2016-10-03 DIAGNOSIS — E785 Hyperlipidemia, unspecified: Secondary | ICD-10-CM | POA: Insufficient documentation

## 2016-10-03 DIAGNOSIS — F0391 Unspecified dementia with behavioral disturbance: Secondary | ICD-10-CM

## 2016-10-03 DIAGNOSIS — E039 Hypothyroidism, unspecified: Secondary | ICD-10-CM

## 2016-10-03 DIAGNOSIS — I10 Essential (primary) hypertension: Secondary | ICD-10-CM | POA: Diagnosis not present

## 2016-10-03 DIAGNOSIS — E782 Mixed hyperlipidemia: Secondary | ICD-10-CM | POA: Diagnosis not present

## 2016-10-03 NOTE — Progress Notes (Signed)
Nursing Home Location:  [facility]  [facility]  PCP: Marga Melnick, MD  No Known Allergies  Chief Complaint  Patient presents with  . Medical Management of Chronic Issues    Resident is being seen for a routine visit.     HPI:  Patient is a 82 y.o. female seen today at [facility] for routine follow up on chronic conditions. Pt with hx of dementia, CKD, depression, DM, dyslipidemia, hypothyroid, hypertension. Pt has been doing well in the last month. No significant behavioral changes. Staff reports some days she eats better than others based on what food is served. No changes in bowel or bladder. Pt denies pain.   Review of Systems:  Review of Systems  Unable to perform ROS: Dementia    Past Medical History:  Diagnosis Date  . Chronic kidney disease   . Dementia with behavioral disturbance 03/19/2015   Notated on FL3 Form from Dr. Dreama Saa [phone]  . Depression    previously saw Dr Donell Beers  . Diabetes mellitus without complication (HCC)    11/18/15 A1c 6%  . Dyslipidemia    11/18/15 Tg 74, HDL 60, LDL 90 on statin  . Hypertension   . Hypothyroidism    11/18/15 TSH 53.6, 02/07/16 TSH 9.42 @ [facility]  . Scabies    02/21/16 [facility],[place]; Rx: Permethrin cream  . Vitamin D deficiency    11/18/15 vitamin D 25-hydroxy 39   Past Surgical History:  Procedure Laterality Date  . no record     04/10/15 [facility] note: see old chart   Social History:   reports that she has quit smoking. She has never used smokeless tobacco. She reports that she does not drink alcohol or use drugs.  Family History  Problem Relation Age of Onset  . Family history unknown: Yes    Medications: Patient's Medications  New Prescriptions   No medications on file  Previous Medications   ACETAMINOPHEN (TYLENOL) 650 MG CR TABLET    Take 650 mg by mouth every 6 (six) hours as needed for pain.   AMBULATORY NON FORMULARY MEDICATION     Calcium D 600-400 mg unit Sig: Take one tablet by mouth twice daily   ARIPIPRAZOLE (ABILIFY) 5 MG TABLET    Take 5 mg by mouth daily.   ASCORBIC ACID (VITAMIN C) 500 MG TABLET    Take 500 mg by mouth daily.   ASPIRIN EC 81 MG TABLET    Take 81 mg by mouth daily.   ATORVASTATIN (LIPITOR) 40 MG TABLET    Take 40 mg by mouth daily.   CHOLECALCIFEROL (VITAMIN D) 2000 UNITS TABLET    Take 2,000 Units by mouth daily.   DIVALPROEX (DEPAKOTE SPRINKLE) 125 MG CAPSULE    Take three capsules by mouth twice daily   DONEPEZIL (ARICEPT) 10 MG TABLET    Take 10 mg by mouth at bedtime.   FUROSEMIDE (LASIX) 20 MG TABLET    Take 20 mg by mouth daily.   HYDROXYZINE (ATARAX/VISTARIL) 10 MG TABLET    Take 10 mg by mouth every 12 (twelve) hours.    LEVOTHYROXINE (SYNTHROID, LEVOTHROID) 150 MCG TABLET    Take 150 mcg by mouth daily before breakfast.   MEMANTINE (NAMENDA) 10 MG TABLET    Take 10 mg by mouth 2 (two) times daily.   MIRTAZAPINE (REMERON) 15 MG TABLET    Take 15 mg by mouth at bedtime.   MULTIPLE VITAMIN (MULTI-VITAMIN DAILY PO)  Take one tablet by mouth once daily   PAROXETINE (PAXIL) 10 MG TABLET    Take 10 mg by mouth at bedtime.   POTASSIUM CHLORIDE (K-DUR) 10 MEQ TABLET    Take 10 mEq by mouth daily.   SENNOSIDES-DOCUSATE SODIUM (SENOKOT-S) 8.6-50 MG TABLET    Take 2 tablets by mouth daily.   SKIN PROTECTANTS, MISC. (MINERIN) CREA    Apply topically to ankles twice a day for dry skin.   TRAZODONE (DESYREL) 50 MG TABLET    Take 50 mg by mouth at bedtime.  Modified Medications   No medications on file  Discontinued Medications   No medications on file     Physical Exam: Vitals:   10/03/16 1329  BP: 129/72  Pulse: 65  Resp: 20  Temp: 97.6 F (36.4 C)  SpO2: 96%  Weight: 203 lb (92.1 kg)  Height:  (1.727 m)    Physical Exam  Constitutional: She appears well-developed and well-nourished. No distress.  HENT:  Head: Normocephalic and atraumatic.  Mouth/Throat: Oropharynx is  clear and moist. No oropharyngeal exudate.  Eyes: Conjunctivae are normal. Pupils are equal, round, and reactive to light.  Neck: Normal range of motion. Neck supple.  Cardiovascular: Normal rate, regular rhythm and normal heart sounds.   Pulmonary/Chest: Effort normal and breath sounds normal.  Abdominal: Soft. Bowel sounds are normal. She exhibits no distension.  Musculoskeletal: She exhibits no edema or tenderness.  Neurological: She is alert.  Oriented to time. Disoriented to place and self.   Skin: Skin is warm and dry. She is not diaphoretic.  Psychiatric: Her mood appears not anxious. Her affect is not labile. Her speech is not delayed. Cognition and memory are impaired. She exhibits abnormal recent memory and abnormal remote memory.    Labs reviewed: Basic Metabolic Panel:  Recent Labs  16/10/96 04/25/16 08/27/16  NA 141 139 142  K 4.5 4.3 4.2  BUN 25* 22* 20  CREATININE 1.2* 1.2* 0.9   Liver Function Tests:  Recent Labs  08/27/16  AST 30  ALT 22  ALKPHOS 94   No results for input(s): LIPASE, AMYLASE in the last 8760 hours. No results for input(s): AMMONIA in the last 8760 hours. CBC:  Recent Labs  04/17/16  WBC 4.9  NEUTROABS 2,058  HGB 13.1  HCT 39  PLT 226   TSH:  Recent Labs  06/09/16 06/28/16 08/27/16  TSH 10.11* 6.52* 25.67*   A1C: Lab Results  Component Value Date   HGBA1C 6.0 04/17/2016   Lipid Panel:  Recent Labs  05/17/16  CHOL 230*  HDL 65  LDLCALC 147  TRIG 91     Assessment/Plan 1. Essential hypertension Blood pressure stable. Will follow up CBC with next blood work. To cont on current regimen  2. Hypothyroidism, unspecified type Synthroid adjusted last month, follow up TSH has been scheduled.   3. Dementia with behavioral disturbance, unspecified dementia type Mood has been stable, conts to have episodes of sadness but overall stable and controlled.   4. Mixed hyperlipidemia conts on lipitor, LDL 147, will have lipids  checked with next blood work  [employer]. Biagio Borg  [facility] [phone] 8 am - 5 pm) [phone] (after hours)

## 2016-10-04 DIAGNOSIS — E785 Hyperlipidemia, unspecified: Secondary | ICD-10-CM | POA: Diagnosis not present

## 2016-10-04 LAB — LIPID PANEL
CHOLESTEROL: 143 mg/dL (ref 0–200)
HDL: 48 mg/dL (ref 35–70)
LDL CALC: 80 mg/dL
Triglycerides: 77 mg/dL (ref 40–160)

## 2016-10-05 DIAGNOSIS — E0590 Thyrotoxicosis, unspecified without thyrotoxic crisis or storm: Secondary | ICD-10-CM | POA: Diagnosis not present

## 2016-10-05 DIAGNOSIS — E039 Hypothyroidism, unspecified: Secondary | ICD-10-CM | POA: Diagnosis not present

## 2016-10-05 DIAGNOSIS — D649 Anemia, unspecified: Secondary | ICD-10-CM | POA: Diagnosis not present

## 2016-10-05 LAB — TSH: TSH: 2.11 u[IU]/mL (ref 0.41–5.90)

## 2016-10-05 LAB — CBC AND DIFFERENTIAL
HCT: 39 % (ref 36–46)
Hemoglobin: 12.4 g/dL (ref 12.0–16.0)
Platelets: 139 10*3/uL — AB (ref 150–399)
WBC: 3.3 10^3/mL

## 2016-10-09 DIAGNOSIS — F0280 Dementia in other diseases classified elsewhere without behavioral disturbance: Secondary | ICD-10-CM | POA: Diagnosis not present

## 2016-10-09 DIAGNOSIS — F339 Major depressive disorder, recurrent, unspecified: Secondary | ICD-10-CM | POA: Diagnosis not present

## 2016-10-09 DIAGNOSIS — F39 Unspecified mood [affective] disorder: Secondary | ICD-10-CM | POA: Diagnosis not present

## 2016-10-09 DIAGNOSIS — G309 Alzheimer's disease, unspecified: Secondary | ICD-10-CM | POA: Diagnosis not present

## 2016-10-26 DIAGNOSIS — E039 Hypothyroidism, unspecified: Secondary | ICD-10-CM | POA: Diagnosis not present

## 2016-10-26 DIAGNOSIS — E0590 Thyrotoxicosis, unspecified without thyrotoxic crisis or storm: Secondary | ICD-10-CM | POA: Diagnosis not present

## 2016-10-26 DIAGNOSIS — D649 Anemia, unspecified: Secondary | ICD-10-CM | POA: Diagnosis not present

## 2016-10-26 LAB — CBC AND DIFFERENTIAL
HCT: 36 % (ref 36–46)
HEMOGLOBIN: 12.3 g/dL (ref 12.0–16.0)
Platelets: 145 10*3/uL — AB (ref 150–399)
WBC: 3.5 10^3/mL

## 2016-10-27 LAB — CBC AND DIFFERENTIAL
HCT: 36 % (ref 36–46)
HEMOGLOBIN: 11.2 g/dL — AB (ref 12.0–16.0)
PLATELETS: 112 10*3/uL — AB (ref 150–399)
WBC: 3.9 10*3/mL

## 2016-10-27 LAB — BASIC METABOLIC PANEL
BUN: 24 mg/dL — AB (ref 4–21)
Creatinine: 0.7 mg/dL (ref 0.5–1.1)
GLUCOSE: 59 mg/dL
POTASSIUM: 3.9 mmol/L (ref 3.4–5.3)
Sodium: 145 mmol/L (ref 137–147)

## 2016-10-27 LAB — HEPATIC FUNCTION PANEL
ALT: 7 U/L (ref 7–35)
AST: 20 U/L (ref 13–35)
Alkaline Phosphatase: 50 U/L (ref 25–125)
Bilirubin, Total: 0.3 mg/dL

## 2016-10-30 DIAGNOSIS — D649 Anemia, unspecified: Secondary | ICD-10-CM | POA: Diagnosis not present

## 2016-10-30 DIAGNOSIS — E039 Hypothyroidism, unspecified: Secondary | ICD-10-CM | POA: Diagnosis not present

## 2016-10-30 DIAGNOSIS — E559 Vitamin D deficiency, unspecified: Secondary | ICD-10-CM | POA: Diagnosis not present

## 2016-10-30 DIAGNOSIS — E785 Hyperlipidemia, unspecified: Secondary | ICD-10-CM | POA: Diagnosis not present

## 2016-10-30 DIAGNOSIS — E0590 Thyrotoxicosis, unspecified without thyrotoxic crisis or storm: Secondary | ICD-10-CM | POA: Diagnosis not present

## 2016-10-30 LAB — TSH: TSH: 9.23 u[IU]/mL — AB (ref 0.41–5.90)

## 2016-10-31 ENCOUNTER — Non-Acute Institutional Stay (SKILLED_NURSING_FACILITY): Payer: Medicare Other | Admitting: Nurse Practitioner

## 2016-10-31 ENCOUNTER — Encounter: Payer: Self-pay | Admitting: Nurse Practitioner

## 2016-10-31 DIAGNOSIS — E039 Hypothyroidism, unspecified: Secondary | ICD-10-CM | POA: Diagnosis not present

## 2016-10-31 DIAGNOSIS — R634 Abnormal weight loss: Secondary | ICD-10-CM

## 2016-10-31 DIAGNOSIS — I10 Essential (primary) hypertension: Secondary | ICD-10-CM

## 2016-10-31 DIAGNOSIS — B351 Tinea unguium: Secondary | ICD-10-CM | POA: Diagnosis not present

## 2016-10-31 DIAGNOSIS — F0391 Unspecified dementia with behavioral disturbance: Secondary | ICD-10-CM

## 2016-10-31 DIAGNOSIS — N189 Chronic kidney disease, unspecified: Secondary | ICD-10-CM | POA: Diagnosis not present

## 2016-10-31 DIAGNOSIS — E782 Mixed hyperlipidemia: Secondary | ICD-10-CM | POA: Diagnosis not present

## 2016-10-31 DIAGNOSIS — R710 Precipitous drop in hematocrit: Secondary | ICD-10-CM | POA: Diagnosis not present

## 2016-10-31 NOTE — Progress Notes (Signed)
Nursing Home Location:  [facility]  [facility]  PCP: Pecola Lawless, MD  No Known Allergies  Chief Complaint  Patient presents with  . Medical Management of Chronic Issues    Resident is being seen for a routine visit.     HPI:  Patient is a 82 y.o. female seen today at [facility] for routine follow up on chronic conditions. Pt with hx of dementia, CKD, depression, DM, dyslipidemia, hypothyroid, hypertension. Pt has been doing well in the last month. No significant behavioral changes. Staff reports some days she eats better than others based on what food is served. No changes in bowel or bladder. Pt denies pain. Family noted fungal toe nails.    Review of Systems:  Review of Systems  Unable to perform ROS: Dementia    Past Medical History:  Diagnosis Date  . Chronic kidney disease   . Dementia with behavioral disturbance 03/19/2015   Notated on FL3 Form from Dr. Dreama Saa [phone]  . Depression    previously saw Dr Donell Beers  . Diabetes mellitus without complication (HCC)    11/18/15 A1c 6%  . Dyslipidemia    11/18/15 Tg 74, HDL 60, LDL 90 on statin  . Hypertension   . Hypothyroidism    11/18/15 TSH 53.6, 02/07/16 TSH 9.42 @ [facility]  . Scabies    02/21/16 [facility],[place]; Rx: Permethrin cream  . Vitamin D deficiency    11/18/15 vitamin D 25-hydroxy 39   Past Surgical History:  Procedure Laterality Date  . no record     04/10/15 [facility] note: see old chart   Social History:   reports that she has quit smoking. She has never used smokeless tobacco. She reports that she does not drink alcohol or use drugs.  Family History  Problem Relation Age of Onset  . Family history unknown: Yes    Medications: Patient's Medications  New Prescriptions   No medications on file  Previous Medications   ACETAMINOPHEN (TYLENOL) 650 MG CR TABLET    Take 650 mg by mouth every 6 (six) hours as needed for pain.   AMBULATORY NON FORMULARY MEDICATION    Calcium D 600-400 mg unit Sig: Take one tablet by mouth twice daily   ARIPIPRAZOLE (ABILIFY) 5 MG TABLET    Take 5 mg by mouth daily.   ASCORBIC ACID (VITAMIN C) 500 MG TABLET    Take 500 mg by mouth daily.   ASPIRIN EC 81 MG TABLET    Take 81 mg by mouth daily.   ATORVASTATIN (LIPITOR) 40 MG TABLET    Take 40 mg by mouth daily.   CHOLECALCIFEROL (VITAMIN D) 2000 UNITS TABLET    Take 2,000 Units by mouth daily.   DIVALPROEX (DEPAKOTE SPRINKLE) 125 MG CAPSULE    Take three capsules by mouth twice daily   DONEPEZIL (ARICEPT) 10 MG TABLET    Take 10 mg by mouth at bedtime.   FUROSEMIDE (LASIX) 20 MG TABLET    Take 20 mg by mouth daily.   HYDROXYZINE (ATARAX/VISTARIL) 10 MG TABLET    Take 10 mg by mouth every 12 (twelve) hours.    LEVOTHYROXINE (SYNTHROID, LEVOTHROID) 150 MCG TABLET    Take 150 mcg by mouth daily before breakfast.   MEMANTINE (NAMENDA) 10 MG TABLET    Take 10 mg by mouth 2 (two) times daily.   MULTIPLE VITAMIN (MULTI-VITAMIN DAILY PO)    Take one tablet by mouth once daily  PAROXETINE (PAXIL) 10 MG TABLET    Take 10 mg by mouth at bedtime.   POTASSIUM CHLORIDE (K-DUR) 10 MEQ TABLET    Take 10 mEq by mouth daily.   SENNOSIDES-DOCUSATE SODIUM (SENOKOT-S) 8.6-50 MG TABLET    Take 2 tablets by mouth daily.   SKIN PROTECTANTS, MISC. (MINERIN) CREA    Apply topically to ankles twice a day for dry skin.   TRAZODONE (DESYREL) 50 MG TABLET    Take 50 mg by mouth at bedtime.  Modified Medications   No medications on file  Discontinued Medications   MIRTAZAPINE (REMERON) 15 MG TABLET    Take 15 mg by mouth at bedtime.     Physical Exam: Vitals:   10/31/16 1436  BP: 124/78  Pulse: 88  Resp: 18  Temp: 98 F (36.7 C)  SpO2: 99%  Weight: 203 lb 6.4 oz (92.3 kg)  Height: 5\' 8"  (1.727 m)    Physical Exam  Constitutional: She appears well-developed and well-nourished. No distress.  HENT:  Head: Normocephalic and atraumatic.  Mouth/Throat:  Oropharynx is clear and moist. No oropharyngeal exudate.  Eyes: Conjunctivae are normal. Pupils are equal, round, and reactive to light.  Neck: Normal range of motion. Neck supple.  Cardiovascular: Normal rate, regular rhythm and normal heart sounds.   Pulmonary/Chest: Effort normal and breath sounds normal.  Abdominal: Soft. Bowel sounds are normal. She exhibits no distension.  Musculoskeletal: She exhibits no edema or tenderness.  Neurological: She is alert.  Oriented to time. Disoriented to place and self.   Skin: Skin is warm and dry. She is not diaphoretic.  Psychiatric: Her mood appears not anxious. Her affect is not labile. Her speech is not delayed. Cognition and memory are impaired. She exhibits abnormal recent memory and abnormal remote memory.    Labs reviewed: Basic Metabolic Panel:  Recent Labs  16/10/96 08/27/16 10/27/16  NA 139 142 145  K 4.3 4.2 3.9  BUN 22* 20 24*  CREATININE 1.2* 0.9 0.7   Liver Function Tests:  Recent Labs  08/27/16 10/27/16  AST 30 20  ALT 22 7  ALKPHOS 94 50   No results for input(s): LIPASE, AMYLASE in the last 8760 hours. No results for input(s): AMMONIA in the last 8760 hours. CBC:  Recent Labs  04/17/16 10/05/16 10/26/16 10/27/16  WBC 4.9 3.3 3.5 3.9  NEUTROABS 2,058  --   --   --   HGB 13.1 12.4 12.3 11.2*  HCT 39 39 36 36  PLT 226 139* 145* 112*   TSH:  Recent Labs  08/27/16 10/05/16 10/30/16  TSH 25.67* 2.11 9.23*   A1C: Lab Results  Component Value Date   HGBA1C 6.0 04/17/2016   Lipid Panel:  Recent Labs  05/17/16 10/04/16  CHOL 230* 143  HDL 65 48  LDLCALC 147 80  TRIG 91 77     Assessment/Plan 1. Hypothyroidism, unspecified type TSH remains elevated, synthroid increased to 175 mcg daily and follow up TSH in 8 weeks.   2. Essential hypertension Remains stable, will cont current regimen  3. Mixed hyperlipidemia LDL at goal. Will cont current regimen  4. Dementia with behavioral disturbance,  unspecified dementia type Advanced dementia, expect slow decline. There has been no acute changes at this time. Will cont current reigmen  5. Chronic kidney disease, unspecified CKD stage -stable, to encourage proper hydration and to avoid NSAIDS (Aleve, Advil, Motrin, Ibuprofen)   6. Loss of weight Weight has been stable with some gain noted.   7.  Drop in hemoglobin Labs reviewed via epic and in pt chart, will follow up cbc in am due to decrease in hgb to verify this is correct. No signs of bleeding noted per staff  8. Dermatophytosis of nail Will get podiatry referral to cut toenails   Axzel Rockhill K. Biagio BorgEubanks, AGNP  [facility] [phone](Monday-Friday 8 am - 5 pm) [phone] (after hours)

## 2016-10-31 NOTE — Progress Notes (Signed)
This encounter was created in error - please disregard.

## 2016-11-01 DIAGNOSIS — D649 Anemia, unspecified: Secondary | ICD-10-CM | POA: Diagnosis not present

## 2016-11-01 DIAGNOSIS — I129 Hypertensive chronic kidney disease with stage 1 through stage 4 chronic kidney disease, or unspecified chronic kidney disease: Secondary | ICD-10-CM | POA: Diagnosis not present

## 2016-11-01 DIAGNOSIS — N183 Chronic kidney disease, stage 3 (moderate): Secondary | ICD-10-CM | POA: Diagnosis not present

## 2016-11-01 LAB — CBC AND DIFFERENTIAL
HCT: 37 % (ref 36–46)
Hemoglobin: 11.8 g/dL — AB (ref 12.0–16.0)
PLATELETS: 132 10*3/uL — AB (ref 150–399)
WBC: 3.3 10^3/mL

## 2016-11-02 DIAGNOSIS — M79675 Pain in left toe(s): Secondary | ICD-10-CM | POA: Diagnosis not present

## 2016-11-02 DIAGNOSIS — E119 Type 2 diabetes mellitus without complications: Secondary | ICD-10-CM | POA: Diagnosis not present

## 2016-11-02 DIAGNOSIS — M79674 Pain in right toe(s): Secondary | ICD-10-CM | POA: Diagnosis not present

## 2016-11-02 DIAGNOSIS — B351 Tinea unguium: Secondary | ICD-10-CM | POA: Diagnosis not present

## 2016-11-15 ENCOUNTER — Ambulatory Visit: Payer: Medicare Other | Admitting: Podiatry

## 2016-11-28 ENCOUNTER — Encounter: Payer: Self-pay | Admitting: Adult Health

## 2016-11-28 ENCOUNTER — Non-Acute Institutional Stay (SKILLED_NURSING_FACILITY): Payer: Medicare Other | Admitting: Adult Health

## 2016-11-28 DIAGNOSIS — F339 Major depressive disorder, recurrent, unspecified: Secondary | ICD-10-CM | POA: Diagnosis not present

## 2016-11-28 DIAGNOSIS — K5901 Slow transit constipation: Secondary | ICD-10-CM

## 2016-11-28 DIAGNOSIS — F0391 Unspecified dementia with behavioral disturbance: Secondary | ICD-10-CM | POA: Diagnosis not present

## 2016-11-28 DIAGNOSIS — E782 Mixed hyperlipidemia: Secondary | ICD-10-CM | POA: Diagnosis not present

## 2016-11-28 DIAGNOSIS — E559 Vitamin D deficiency, unspecified: Secondary | ICD-10-CM | POA: Diagnosis not present

## 2016-11-28 DIAGNOSIS — E039 Hypothyroidism, unspecified: Secondary | ICD-10-CM | POA: Diagnosis not present

## 2016-11-28 DIAGNOSIS — G4700 Insomnia, unspecified: Secondary | ICD-10-CM

## 2016-11-28 NOTE — Progress Notes (Signed)
DATE:  11/28/2016   MRN:  [id number]  BIRTHDAY: [birth year]  Facility:  Nursing Home Location:  [facility] Room Number: 214-B  LEVEL OF CARE:  SNF (31)  Contact Information    Name Relation Home Work Mobile   Ladson Son [phone]     Reid,Cynthia Daughter   [phone]   Cox,April Daughter   [phone]       Code Status History    This patient does not have a recorded code status. Please follow your organizational policy for patients in this situation.       Chief Complaint  Patient presents with  . Medical Management of Chronic Issues    Routine    HISTORY OF PRESENT ILLNESS:  This is an 82-YO female seen for a routine visit.  She is a long-term care resident of [facility].Hydroxyzine PRN was recently discontinued. Levothyroxine dosage was recently decreased to 150 mcg . Latest tsh is 9.23.   PAST MEDICAL HISTORY:  Past Medical History:  Diagnosis Date  . Chronic kidney disease   . Dementia with behavioral disturbance 03/19/2015   Notated on FL3 Form from Dr. Dreama Saa [phone]  . Depression    previously saw Dr Donell Beers  . Diabetes mellitus without complication (HCC)    11/18/15 A1c 6%  . Dyslipidemia    11/18/15 Tg 74, HDL 60, LDL 90 on statin  . Hypertension   . Hypothyroidism    11/18/15 TSH 53.6, 02/07/16 TSH 9.42 @ [facility]  . Scabies    02/21/16 [facility],[place]; Rx: Permethrin cream  . Vitamin D deficiency    11/18/15 vitamin D 25-hydroxy 39     CURRENT MEDICATIONS: Reviewed  Patient's Medications  New Prescriptions   No medications on file  Previous Medications   ACETAMINOPHEN (TYLENOL) 650 MG CR TABLET    Take 650 mg by mouth every 6 (six) hours as needed for pain.   AMBULATORY NON FORMULARY MEDICATION    Calcium D 600-400 mg unit Sig: Take one tablet by mouth twice daily   ARIPIPRAZOLE (ABILIFY) 5 MG TABLET    Take 5 mg by mouth daily.   ASCORBIC ACID (VITAMIN  C) 500 MG TABLET    Take 500 mg by mouth daily.   ASPIRIN EC 81 MG TABLET    Take 81 mg by mouth daily.   ATORVASTATIN (LIPITOR) 40 MG TABLET    Take 40 mg by mouth daily.   CHOLECALCIFEROL (VITAMIN D) 2000 UNITS TABLET    Take 2,000 Units by mouth daily.   DIVALPROEX (DEPAKOTE SPRINKLE) 125 MG CAPSULE    Take three capsules by mouth twice daily   DONEPEZIL (ARICEPT) 10 MG TABLET    Take 10 mg by mouth at bedtime.   FUROSEMIDE (LASIX) 20 MG TABLET    Take 20 mg by mouth daily.   LEVOTHYROXINE (SYNTHROID, LEVOTHROID) 150 MCG TABLET    Take 150 mcg by mouth daily before breakfast.   MEMANTINE (NAMENDA) 10 MG TABLET    Take 10 mg by mouth 2 (two) times daily.   MULTIPLE VITAMIN (MULTI-VITAMIN DAILY PO)    Take one tablet by mouth once daily   PAROXETINE (PAXIL) 10 MG TABLET    Take 10 mg by mouth at bedtime.   POTASSIUM CHLORIDE (K-DUR) 10 MEQ TABLET    Take 10 mEq by mouth daily.   SENNOSIDES-DOCUSATE SODIUM (SENOKOT-S) 8.6-50 MG TABLET    Take 2 tablets by mouth daily.   SKIN PROTECTANTS,  MISC. (MINERIN) CREA    Apply topically to ankles twice a day for dry skin.   TRAZODONE (DESYREL) 50 MG TABLET    Take 50 mg by mouth at bedtime.  Modified Medications   No medications on file  Discontinued Medications   HYDROXYZINE (ATARAX/VISTARIL) 10 MG TABLET    Take 10 mg by mouth every 12 (twelve) hours.      No Known Allergies   REVIEW OF SYSTEMS:   Unable to obtain due to advanced dementia     PHYSICAL EXAMINATION  GENERAL APPEARANCE: Well nourished. In no acute distress. Normal body habitus SKIN:  Skin is warm and dry.  HEAD: Normal in size and contour. No evidence of trauma EYES: Lids open and close normally. No blepharitis, entropion or ectropion. PERRL. Conjunctivae are clear and sclerae are white. Lenses are without opacity EARS: Pinnae are normal. Patient hears normal voice tunes of the examiner MOUTH and THROAT: Lips are without lesions. Oral mucosa is moist and without lesions.  Tongue is normal in shape, size, and color and without lesions NECK: supple, trachea midline, no neck masses, no thyroid tenderness, no thyromegaly LYMPHATICS: no LAN in the neck, no supraclavicular LAN RESPIRATORY: breathing is even & unlabored, BS CTAB CARDIAC: RRR, no murmur,no extra heart sounds, no edema GI: abdomen soft, normal BS, no masses, no tenderness, no hepatomegaly, no splenomegaly EXTREMITIES:  Able to move X 4 extremities, BLE with generalized weakness PSYCHIATRIC: Alert to self, disoriented to time and place. Agitated   LABS/RADIOLOGY: Labs reviewed: Basic Metabolic Panel:  Recent Labs  16/03/9610/08/17 08/27/16 10/27/16  NA 139 142 145  K 4.3 4.2 3.9  BUN 22* 20 24*  CREATININE 1.2* 0.9 0.7   Liver Function Tests:  Recent Labs  08/27/16 10/27/16  AST 30 20  ALT 22 7  ALKPHOS 94 50   CBC:  Recent Labs  04/17/16  10/26/16 10/27/16 11/01/16  WBC 4.9  < > 3.5 3.9 3.3  NEUTROABS 2,058  --   --   --   --   HGB 13.1  < > 12.3 11.2* 11.8*  HCT 39  < > 36 36 37  PLT 226  < > 145* 112* 132*  < > = values in this interval not displayed. Lipid Panel:  Recent Labs  05/17/16 10/04/16  HDL 65 48    ASSESSMENT/PLAN:   1. Hypothyroidism, unspecified type - will increase Levothyroxine from 150 mcg to 175 mcg Q D and repeat tsh in 8 weeks Lab Results  Component Value Date   TSH 9.23 (A) 10/30/2016    2. Dementia with behavioral disturbance, unspecified dementia type - Continue Memantine 10 mg 1 tab by mouth twice a day, Aricept 10 mg 1 tab by mouth daily at bedtime and Abilify 5 mg 1 tab by mouth daily  3. Insomnia, unspecified type - continue trazodone 50 mg 1 tab by mouth daily at bedtime   4. Depression, recurrent (HCC) - mood is stable; continue Paxil 10 mg 1 tab by mouth daily at bedtime   5. Mixed hyperlipidemia - continue Lipitor 40 mg 1 tab by mouth daily at bedtime Lab Results  Component Value Date   CHOL 143 10/04/2016   HDL 48 10/04/2016   LDLCALC  80 10/04/2016   TRIG 77 10/04/2016     6. Slow transit constipation - continue senna S 8.6-50 mg 2 tabs by mouth daily   7. Vitamin D deficiency - continue vitamin D3 1000 units 2 tabs = 2000 units by  mouth daily    Goals of care:  Long-term care    Froilan Mclean C. Medina-Vargas - NP    [employer] [phone]

## 2016-12-19 DIAGNOSIS — F339 Major depressive disorder, recurrent, unspecified: Secondary | ICD-10-CM | POA: Diagnosis not present

## 2016-12-19 DIAGNOSIS — F0280 Dementia in other diseases classified elsewhere without behavioral disturbance: Secondary | ICD-10-CM | POA: Diagnosis not present

## 2016-12-19 DIAGNOSIS — G309 Alzheimer's disease, unspecified: Secondary | ICD-10-CM | POA: Diagnosis not present

## 2016-12-19 DIAGNOSIS — F39 Unspecified mood [affective] disorder: Secondary | ICD-10-CM | POA: Diagnosis not present

## 2016-12-24 ENCOUNTER — Non-Acute Institutional Stay (SKILLED_NURSING_FACILITY): Payer: Medicare Other | Admitting: Adult Health

## 2016-12-24 ENCOUNTER — Encounter: Payer: Self-pay | Admitting: Adult Health

## 2016-12-24 DIAGNOSIS — F39 Unspecified mood [affective] disorder: Secondary | ICD-10-CM | POA: Diagnosis not present

## 2016-12-24 DIAGNOSIS — K5901 Slow transit constipation: Secondary | ICD-10-CM

## 2016-12-24 DIAGNOSIS — F339 Major depressive disorder, recurrent, unspecified: Secondary | ICD-10-CM

## 2016-12-24 DIAGNOSIS — E782 Mixed hyperlipidemia: Secondary | ICD-10-CM | POA: Diagnosis not present

## 2016-12-24 DIAGNOSIS — I10 Essential (primary) hypertension: Secondary | ICD-10-CM

## 2016-12-24 DIAGNOSIS — E039 Hypothyroidism, unspecified: Secondary | ICD-10-CM

## 2016-12-24 DIAGNOSIS — F0391 Unspecified dementia with behavioral disturbance: Secondary | ICD-10-CM | POA: Diagnosis not present

## 2016-12-24 NOTE — Progress Notes (Signed)
DATE:  12/24/2016   MRN:  [id number]  BIRTHDAY: 01-20-1935  Facility:  Nursing Home Location:  [facility] Room Number: 214-B  LEVEL OF CARE:  SNF (31)  Contact Information    Name Relation Home Work Mobile   PembrokeRobinson,Roberto Son [phone]     Reid,Cynthia Daughter   [phone]   Cox,April Daughter   [phone]       Code Status History    This patient does not have a recorded code status. Please follow your organizational policy for patients in this situation.       Chief Complaint  Patient presents with  . Medical Management of Chronic Issues    Routine visit    HISTORY OF PRESENT ILLNESS:  This is an Kelly [name] seem for a routine visit.  She is a long-term care resident of [facility]. She was seen in the room today. Trazodone was recently discontinued and was started on Remeron by Psych NP.  She has PMH of dementia, CKD, depression, diabetes mellitus, dyslipidemia, hypothyroidism and hypertension.     PAST MEDICAL HISTORY:  Past Medical History:  Diagnosis Date  . Chronic kidney disease   . Dementia with behavioral disturbance 03/19/2015   Notated on FL3 Form from Dr. Dreama SaaJoel Brass [phone]  . Depression    previously saw Dr Donell BeersPlovsky  . Diabetes mellitus without complication (HCC)    11/18/15 A1c 6%  . Dyslipidemia    11/18/15 Tg 74, HDL 60, LDL 90 on statin  . Hypertension   . Hypothyroidism    11/18/15 TSH 53.6, 02/07/16 TSH 9.42 @ [facility]  . Scabies    02/21/16 [facility],[place]; Rx: Permethrin cream  . Vitamin D deficiency    11/18/15 vitamin D 25-hydroxy 39     CURRENT MEDICATIONS: Reviewed  Patient's Medications  New Prescriptions   No medications on file  Previous Medications   ACETAMINOPHEN (TYLENOL) 650 MG CR TABLET    Take 650 mg by mouth every 6 (six) hours as needed for pain.   AMBULATORY NON FORMULARY MEDICATION    Calcium D 600-400 mg unit Sig: Take one tablet by mouth  twice daily   ARIPIPRAZOLE (ABILIFY) 5 MG TABLET    Take 5 mg by mouth daily.   ASCORBIC ACID (VITAMIN C) 500 MG TABLET    Take 500 mg by mouth daily.   ASPIRIN EC 81 MG TABLET    Take 81 mg by mouth daily.   ATORVASTATIN (LIPITOR) 40 MG TABLET    Take 40 mg by mouth daily.   CHOLECALCIFEROL (VITAMIN D) 2000 UNITS TABLET    Take 2,000 Units by mouth daily.   DIVALPROEX (DEPAKOTE SPRINKLE) 125 MG CAPSULE    Take three capsules by mouth twice daily   DONEPEZIL (ARICEPT) 10 MG TABLET    Take 10 mg by mouth at bedtime.   FUROSEMIDE (LASIX) 20 MG TABLET    Take 20 mg by mouth daily.   LEVOTHYROXINE (SYNTHROID, LEVOTHROID) 175 MCG TABLET    Take 175 mcg by mouth daily before breakfast.   MEMANTINE (NAMENDA) 10 MG TABLET    Take 10 mg by mouth 2 (two) times daily.   MIRTAZAPINE (REMERON) 15 MG TABLET    Take 15 mg by mouth at bedtime.   MULTIPLE VITAMIN (MULTI-VITAMIN DAILY PO)    Take one tablet by mouth once daily   PAROXETINE (PAXIL) 10 MG TABLET    Take 10 mg by mouth at bedtime.  POTASSIUM CHLORIDE (K-DUR) 10 MEQ TABLET    Take 10 mEq by mouth daily.   SENNOSIDES-DOCUSATE SODIUM (SENOKOT-S) 8.6-50 MG TABLET    Take 2 tablets by mouth daily.   SKIN PROTECTANTS, MISC. (MINERIN) CREA    Apply topically to ankles twice a day for dry skin.  Modified Medications   No medications on file  Discontinued Medications   LEVOTHYROXINE (SYNTHROID, LEVOTHROID) 150 MCG TABLET    Take 150 mcg by mouth daily before breakfast.   TRAZODONE (DESYREL) 50 MG TABLET    Take 50 mg by mouth at bedtime.     No Known Allergies   REVIEW OF SYSTEMS:  Unable to obtain due to dementia.    PHYSICAL EXAMINATION  GENERAL APPEARANCE: Well nourished. In no acute distress. Normal body habitus SKIN:  Skin is warm and dry.  HEAD: Normal in size and contour. No evidence of trauma EYES: Lids open and close normally. No blepharitis, entropion or ectropion. PERRL. Conjunctivae are clear and sclerae are white. Lenses are  without opacity EARS: Pinnae are normal. Patient hears normal voice tunes of the examiner MOUTH and THROAT: Lips are without lesions. Oral mucosa is moist and without lesions. Tongue is normal in shape, size, and color and without lesions NECK: supple, trachea midline, no neck masses, no thyroid tenderness, no thyromegaly LYMPHATICS: no LAN in the neck, no supraclavicular LAN RESPIRATORY: breathing is even & unlabored, BS CTAB CARDIAC: RRR, no murmur,no extra heart sounds, no edema GI: abdomen soft, normal BS, no masses, no tenderness, no hepatomegaly, no splenomegaly EXTREMITIES:  Able to move X 4 extremities PSYCHIATRIC: Alert to self, disoriented to time and place.  Affect and behavior are appropriate   LABS/RADIOLOGY: Labs reviewed: Basic Metabolic Panel:  Recent Labs  16/10/96 08/27/16 10/27/16  NA 139 142 145  K 4.3 4.2 3.9  BUN 22* 20 24*  CREATININE 1.2* 0.9 0.7   Liver Function Tests:  Recent Labs  08/27/16 10/27/16  AST 30 20  ALT 22 7  ALKPHOS 94 50   CBC:  Recent Labs  04/17/16  10/26/16 10/27/16 11/01/16  WBC 4.9  < > 3.5 3.9 3.3  NEUTROABS 2,058  --   --   --   --   HGB 13.1  < > 12.3 11.2* 11.8*  HCT 39  < > 36 36 37  PLT 226  < > 145* 112* 132*  < > = values in this interval not displayed. Lipid Panel:  Recent Labs  05/17/16 10/04/16  HDL 65 48    ASSESSMENT/PLAN:  1. Depression, recurrent (HCC) - stable; recently started on Remeron 15 mg by mouth daily at bedtime and continue paroxetine 10 mg 1 tab by mouth daily at bedtime,  followed-up by [facility] NP   2. Mixed hyperlipidemia - continue Lipitor 40 mg 1 tab by mouth daily at bedtime Lab Results  Component Value Date   CHOL 143 10/04/2016   HDL 48 10/04/2016   LDLCALC 80 10/04/2016   TRIG 77 10/04/2016     3. Hypothyroidism, unspecified type - recently increased levothyroxine from 150 g to 175 g 1 tab by mouth daily, for repeat TSH Lab Results  Component Value Date   TSH  9.23 (A) 10/30/2016     4. Slow transit constipation - continue sennosides-docusate 2 tabs by mouth daily   5. Dementia with behavioral disturbance, unspecified dementia type - continue memantine 10 mg 1 tab by mouth twice a day and Aricept 10 mg 1 tab by  mouth daily at bedtime, continue supportive care; fall precautions   6. Mood disorder with psychosis (HCC) - mood is stable; continue Depakote sprinkles 125 mg keep 3 capsules = 375 mg by mouth twice a day and Abilify 5 mg 1 tab by mouth daily at bedtime   7. Essential hypertension - well controlled; continue Lasix 20 mg 1 tab by mouth daily and KCl ER 10 meq 1 tab by mouth daily     Goals of care:  Long-term care    Myha Arizpe C. Medina-Vargas - NP   [employer] [phone]

## 2017-01-10 ENCOUNTER — Non-Acute Institutional Stay (SKILLED_NURSING_FACILITY): Payer: Medicare Other

## 2017-01-10 DIAGNOSIS — Z0000 Encounter for general adult medical examination without abnormal findings: Secondary | ICD-10-CM

## 2017-01-10 NOTE — Progress Notes (Signed)
Subjective:   Kelly [name] is a 82 y.o. female who presents for an Initial Medicare Annual Wellness Visit at [facility]        Objective:    Today's Vitals   01/10/17 1514  BP: 132/64  Pulse: 63  Temp: (!) 97.2 F (36.2 C)  TempSrc: Oral  SpO2: 93%  Weight: 203 lb (92.1 kg)  Height: 5\' 9"  (1.753 m)   Body mass index is 29.98 kg/m.   Current Medications (verified) Outpatient Encounter Prescriptions as of 01/10/2017  Medication Sig  . acetaminophen (TYLENOL) 650 MG CR tablet Take 650 mg by mouth every 6 (six) hours as needed for pain.  Marland Kitchen AMBULATORY NON FORMULARY MEDICATION Calcium D 600-400 mg unit Sig: Take one tablet by mouth twice daily  . ARIPiprazole (ABILIFY) 5 MG tablet Take 5 mg by mouth daily.  Marland Kitchen ascorbic acid (VITAMIN C) 500 MG tablet Take 500 mg by mouth daily.  Marland Kitchen aspirin EC 81 MG tablet Take 81 mg by mouth daily.  Marland Kitchen atorvastatin (LIPITOR) 40 MG tablet Take 40 mg by mouth daily.  . Cholecalciferol (VITAMIN D) 2000 units tablet Take 2,000 Units by mouth daily.  . divalproex (DEPAKOTE SPRINKLE) 125 MG capsule Take three capsules by mouth twice daily  . donepezil (ARICEPT) 10 MG tablet Take 10 mg by mouth at bedtime.  . furosemide (LASIX) 20 MG tablet Take 20 mg by mouth daily.  Marland Kitchen levothyroxine (SYNTHROID, LEVOTHROID) 175 MCG tablet Take 175 mcg by mouth daily before breakfast.  . memantine (NAMENDA) 10 MG tablet Take 10 mg by mouth 2 (two) times daily.  . mirtazapine (REMERON) 15 MG tablet Take 15 mg by mouth at bedtime.  . Multiple Vitamin (MULTI-VITAMIN DAILY PO) Take one tablet by mouth once daily  . PARoxetine (PAXIL) 10 MG tablet Take 10 mg by mouth at bedtime.  . potassium chloride (K-DUR) 10 MEQ tablet Take 10 mEq by mouth daily.  . sennosides-docusate sodium (SENOKOT-S) 8.6-50 MG tablet Take 2 tablets by mouth daily.  . Skin Protectants, Misc. (MINERIN) CREA Apply topically to ankles twice a day for dry skin.   No facility-administered  encounter medications on file as of 01/10/2017.     Allergies (verified) Patient has no known allergies.   History: Past Medical History:  Diagnosis Date  . Chronic kidney disease   . Dementia with behavioral disturbance 03/19/2015   Notated on FL3 Form from Dr. Dreama Saa [phone]  . Depression    previously saw Dr Donell Beers  . Diabetes mellitus without complication (HCC)    11/18/15 A1c 6%  . Dyslipidemia    11/18/15 Tg 74, HDL 60, LDL 90 on statin  . Hypertension   . Hypothyroidism    11/18/15 TSH 53.6, 02/07/16 TSH 9.42 @ [facility]  . Scabies    02/21/16 [facility],[place]; Rx: Permethrin cream  . Vitamin D deficiency    11/18/15 vitamin D 25-hydroxy 39   Past Surgical History:  Procedure Laterality Date  . no record     04/10/15 [facility] note: see old chart   Family History  Problem Relation Age of Onset  . Family history unknown: Yes   Social History   Occupational History  . Not on file.   Social History Main Topics  . Smoking status: Former Games developer  . Smokeless tobacco: Never Used     Comment: Pt has not smoked while at SNF per staff.   . Alcohol use No  . Drug use: No  .  Sexual activity: No    Tobacco Counseling Counseling given: Not Answered   Activities of Daily Living In your present state of health, do you have any difficulty performing the following activities: 01/10/2017  Hearing? Y  Vision? Y  Difficulty concentrating or making decisions? Y  Walking or climbing stairs? Y  Dressing or bathing? Y  Doing errands, shopping? Y  Preparing Food and eating ? Y  Using the Toilet? Y  In the past six months, have you accidently leaked urine? Y  Do you have problems with loss of bowel control? Y  Managing your Medications? Y  Managing your Finances? Y  Housekeeping or managing your Housekeeping? Y    Immunizations and Health Maintenance Immunization History  Administered Date(s) Administered  . Influenza-Unspecified 02/17/2016  . PPD  Test 04/08/2016  . Pneumococcal-Unspecified 02/16/2014   Health Maintenance Due  Topic Date Due  . FOOT EXAM  10/12/1944  . OPHTHALMOLOGY EXAM  10/12/1944  . DEXA SCAN  10/11/2001  . HEMOGLOBIN A1C  10/15/2016    Patient Care Team: Pecola LawlessHopper, William F, MD as PCP - General (Internal Medicine)  Indicate any recent Medical Services you may have received from other than [facility] providers in the past year (date may be approximate).     Assessment:   This is a routine wellness examination for Kelly [name].   Hearing/Vision screen No exam data present  Dietary issues and exercise activities discussed: Current Exercise Habits: The patient does not participate in regular exercise at present, Exercise limited by: neurologic condition(s)  Goals    None     Depression Screen PHQ 2/9 Scores 01/10/2017  PHQ - 2 Score 6  PHQ- 9 Score 14    Fall Risk Fall Risk  01/10/2017 08/07/2016 04/20/2016  Falls in the past year? No Yes Yes  Number falls in past yr: - 2 or more 2 or more  Injury with Fall? - - Yes  Risk Factor Category  - - High Fall Risk    Cognitive Function:     6CIT Screen 01/10/2017  What Year? 4 points  What month? 3 points  What time? 3 points  Count back from 20 4 points  Months in reverse 4 points  Repeat phrase 10 points  Total Score 28    Screening Tests Health Maintenance  Topic Date Due  . FOOT EXAM  10/12/1944  . OPHTHALMOLOGY EXAM  10/12/1944  . DEXA SCAN  10/11/2001  . HEMOGLOBIN A1C  10/15/2016  . PNA vac Low Risk Adult (2 of 2 - PCV13) 10/15/2017 (Originally 02/17/2015)  . TETANUS/TDAP  09/16/2018 (Originally 10/12/1953)  . INFLUENZA VACCINE  01/16/2017  . URINE MICROALBUMIN  06/29/2017      Plan:    I have personally reviewed and addressed the Medicare Annual Wellness questionnaire and have noted the following in the patient's chart:  A. Medical and social history B. Use of alcohol, tobacco or illicit drugs  C. Current medications and  supplements D. Functional ability and status E.  Nutritional status F.  Physical activity G. Advance directives H. List of other physicians I.  Hospitalizations, surgeries, and ER visits in previous 12 months J.  Vitals K. Screenings to include hearing, vision, cognitive, depression L. Referrals and appointments - none  In addition, I have reviewed and discussed with patient certain preventive protocols, quality metrics, and best practice recommendations. A written personalized care plan for preventive services as well as general preventive health recommendations were provided to patient.  See attached scanned questionnaire for  additional information.   Signed,   Annetta MawSara Gonthier, RN Nurse Health Advisor   Quick Notes   Health Maintenance: Foot exam, eye exam, HgA1C, PNA 13 due, ordered. DEXA due-not ordered, pt is non ambulatory     Abnormal Screen: 6 CIT-28    Patient Concerns: None     Nurse Concerns: None  I have personally reviewed the health advisor's clinical note, was available for consultation, and agree with the assessment and plan as written. Pecola LawlessWilliam F Hopper M.D., FACP, [facility]

## 2017-01-10 NOTE — Patient Instructions (Signed)
Kelly [name] , Thank you for taking time to come for your Medicare Wellness Visit. I appreciate your ongoing commitment to your health goals. Please review the following plan we discussed and let me know if I can assist you in the future.   Screening recommendations/referrals: Colonoscopy excluded, pt is over age 80 Mammogram excluded, pt is over age 80 Bone Density excluded, pt is non ambulatory Recommended yearly ophthalmology/optometry visit for glaucoma screening and checkup Recommended yearly dental visit for hygiene and checkup  Vaccinations: Influenza vaccine due 2018 fall season Pneumococcal vaccine 13 due, ordered Tdap vaccine due, ordered Shingles vaccine not in records  Advanced directives: Need a copy for chart  Conditions/risks identified: None  Next appointment: Dr. Alwyn RenHopper makes rounds   Preventive Care 65 Years and Older, Female Preventive care refers to lifestyle choices and visits with your health care provider that can promote health and wellness. What does preventive care include?  A yearly physical exam. This is also called an annual well check.  Dental exams once or twice a year.  Routine eye exams. Ask your health care provider how often you should have your eyes checked.  Personal lifestyle choices, including:  Daily care of your teeth and gums.  Regular physical activity.  Eating a healthy diet.  Avoiding tobacco and drug use.  Limiting alcohol use.  Practicing safe sex.  Taking low-dose aspirin every day.  Taking vitamin and mineral supplements as recommended by your health care provider. What happens during an annual well check? The services and screenings done by your health care provider during your annual well check will depend on your age, overall health, lifestyle risk factors, and family history of disease. Counseling  Your health care provider may ask you questions about your:  Alcohol use.  Tobacco use.  Drug use.  Emotional  well-being.  Home and relationship well-being.  Sexual activity.  Eating habits.  History of falls.  Memory and ability to understand (cognition).  Work and work Astronomerenvironment.  Reproductive health. Screening  You may have the following tests or measurements:  Height, weight, and BMI.  Blood pressure.  Lipid and cholesterol levels. These may be checked every 5 years, or more frequently if you are over 80 years old.  Skin check.  Lung cancer screening. You may have this screening every year starting at age 80 if you have a 30-pack-year history of smoking and currently smoke or have quit within the past 15 years.  Fecal occult blood test (FOBT) of the stool. You may have this test every year starting at age 80.  Flexible sigmoidoscopy or colonoscopy. You may have a sigmoidoscopy every 5 years or a colonoscopy every 10 years starting at age 80.  Hepatitis C blood test.  Hepatitis B blood test.  Sexually transmitted disease (STD) testing.  Diabetes screening. This is done by checking your blood sugar (glucose) after you have not eaten for a while (fasting). You may have this done every 1-3 years.  Bone density scan. This is done to screen for osteoporosis. You may have this done starting at age 465.  Mammogram. This may be done every 1-2 years. Talk to your health care provider about how often you should have regular mammograms. Talk with your health care provider about your test results, treatment options, and if necessary, the need for more tests. Vaccines  Your health care provider may recommend certain vaccines, such as:  Influenza vaccine. This is recommended every year.  Tetanus, diphtheria, and acellular pertussis (Tdap, Td)  vaccine. You may need a Td booster every 10 years.  Zoster vaccine. You may need this after age 42.  Pneumococcal 13-valent conjugate (PCV13) vaccine. One dose is recommended after age 4.  Pneumococcal polysaccharide (PPSV23) vaccine. One  dose is recommended after age 2. Talk to your health care provider about which screenings and vaccines you need and how often you need them. This information is not intended to replace advice given to you by your health care provider. Make sure you discuss any questions you have with your health care provider. Document Released: 07/01/2015 Document Revised: 02/22/2016 Document Reviewed: 04/05/2015 Elsevier Interactive Patient Education  2017 [employer] Prevention in the Home Falls can cause injuries. They can happen to people of all ages. There are many things you can do to make your home safe and to help prevent falls. What can I do on the outside of my home?  Regularly fix the edges of walkways and driveways and fix any cracks.  Remove anything that might make you trip as you walk through a door, such as a raised step or threshold.  Trim any bushes or trees on the path to your home.  Use bright outdoor lighting.  Clear any walking paths of anything that might make someone trip, such as rocks or tools.  Regularly check to see if handrails are loose or broken. Make sure that both sides of any steps have handrails.  Any raised decks and porches should have guardrails on the edges.  Have any leaves, snow, or ice cleared regularly.  Use sand or salt on walking paths during winter.  Clean up any spills in your garage right away. This includes oil or grease spills. What can I do in the bathroom?  Use night lights.  Install grab bars by the toilet and in the tub and shower. Do not use towel bars as grab bars.  Use non-skid mats or decals in the tub or shower.  If you need to sit down in the shower, use a plastic, non-slip stool.  Keep the floor dry. Clean up any water that spills on the floor as soon as it happens.  Remove soap buildup in the tub or shower regularly.  Attach bath mats securely with double-sided non-slip rug tape.  Do not have throw rugs and other  things on the floor that can make you trip. What can I do in the bedroom?  Use night lights.  Make sure that you have a light by your bed that is easy to reach.  Do not use any sheets or blankets that are too big for your bed. They should not hang down onto the floor.  Have a firm chair that has side arms. You can use this for support while you get dressed.  Do not have throw rugs and other things on the floor that can make you trip. What can I do in the kitchen?  Clean up any spills right away.  Avoid walking on wet floors.  Keep items that you use a lot in easy-to-reach places.  If you need to reach something above you, use a strong step stool that has a grab bar.  Keep electrical cords out of the way.  Do not use floor polish or wax that makes floors slippery. If you must use wax, use non-skid floor wax.  Do not have throw rugs and other things on the floor that can make you trip. What can I do with my stairs?  Do not leave  any items on the stairs.  Make sure that there are handrails on both sides of the stairs and use them. Fix handrails that are broken or loose. Make sure that handrails are as long as the stairways.  Check any carpeting to make sure that it is firmly attached to the stairs. Fix any carpet that is loose or worn.  Avoid having throw rugs at the top or bottom of the stairs. If you do have throw rugs, attach them to the floor with carpet tape.  Make sure that you have a light switch at the top of the stairs and the bottom of the stairs. If you do not have them, ask someone to add them for you. What else can I do to help prevent falls?  Wear shoes that:  Do not have high heels.  Have rubber bottoms.  Are comfortable and fit you well.  Are closed at the toe. Do not wear sandals.  If you use a stepladder:  Make sure that it is fully opened. Do not climb a closed stepladder.  Make sure that both sides of the stepladder are locked into place.  Ask  someone to hold it for you, if possible.  Clearly mark and make sure that you can see:  Any grab bars or handrails.  First and last steps.  Where the edge of each step is.  Use tools that help you move around (mobility aids) if they are needed. These include:  Canes.  Walkers.  Scooters.  Crutches.  Turn on the lights when you go into a dark area. Replace any light bulbs as soon as they burn out.  Set up your furniture so you have a clear path. Avoid moving your furniture around.  If any of your floors are uneven, fix them.  If there are any pets around you, be aware of where they are.  Review your medicines with your doctor. Some medicines can make you feel dizzy. This can increase your chance of falling. Ask your doctor what other things that you can do to help prevent falls. This information is not intended to replace advice given to you by your health care provider. Make sure you discuss any questions you have with your health care provider. Document Released: 03/31/2009 Document Revised: 11/10/2015 Document Reviewed: 07/09/2014 Elsevier Interactive Patient Education  2017 [employer].

## 2017-01-11 DIAGNOSIS — Z23 Encounter for immunization: Secondary | ICD-10-CM | POA: Diagnosis not present

## 2017-01-12 DIAGNOSIS — E039 Hypothyroidism, unspecified: Secondary | ICD-10-CM | POA: Diagnosis not present

## 2017-01-12 DIAGNOSIS — E119 Type 2 diabetes mellitus without complications: Secondary | ICD-10-CM | POA: Diagnosis not present

## 2017-01-12 DIAGNOSIS — D649 Anemia, unspecified: Secondary | ICD-10-CM | POA: Diagnosis not present

## 2017-01-12 DIAGNOSIS — Z79899 Other long term (current) drug therapy: Secondary | ICD-10-CM | POA: Diagnosis not present

## 2017-01-12 LAB — HEMOGLOBIN A1C: Hemoglobin A1C: 5.7

## 2017-01-15 DIAGNOSIS — M6281 Muscle weakness (generalized): Secondary | ICD-10-CM | POA: Diagnosis not present

## 2017-01-15 DIAGNOSIS — E039 Hypothyroidism, unspecified: Secondary | ICD-10-CM | POA: Diagnosis not present

## 2017-01-15 LAB — TSH: TSH: 7.78 — AB (ref 0.41–5.90)

## 2017-01-17 ENCOUNTER — Non-Acute Institutional Stay (SKILLED_NURSING_FACILITY): Payer: Medicare Other | Admitting: Internal Medicine

## 2017-01-17 ENCOUNTER — Encounter: Payer: Self-pay | Admitting: Internal Medicine

## 2017-01-17 DIAGNOSIS — I10 Essential (primary) hypertension: Secondary | ICD-10-CM | POA: Diagnosis not present

## 2017-01-17 DIAGNOSIS — F0391 Unspecified dementia with behavioral disturbance: Secondary | ICD-10-CM | POA: Diagnosis not present

## 2017-01-17 DIAGNOSIS — E039 Hypothyroidism, unspecified: Secondary | ICD-10-CM | POA: Diagnosis not present

## 2017-01-17 DIAGNOSIS — E1129 Type 2 diabetes mellitus with other diabetic kidney complication: Secondary | ICD-10-CM

## 2017-01-17 DIAGNOSIS — E782 Mixed hyperlipidemia: Secondary | ICD-10-CM

## 2017-01-17 DIAGNOSIS — M79662 Pain in left lower leg: Secondary | ICD-10-CM | POA: Diagnosis not present

## 2017-01-17 NOTE — Assessment & Plan Note (Signed)
BP controlled; no indication for antihypertensive medications  

## 2017-01-17 NOTE — Progress Notes (Signed)
NURSING HOME LOCATION:  [facility] ROOM NUMBER:  [id number]  CODE STATUS:  Full Code  PCP:  Pecola LawlessHopper, William F, MD  [address] [place] [address]   This is a nursing facility follow up of chronic medical diagnoses  Interim medical record and care since [facility] visit was updated with review of diagnostic studies and change in clinical status since last visit were documented.  HPI: Her TSH has improved but was still elevated at 7.78 on 01/15/17. She was started on L-thyroxine 175 g on 7/9. A1c is prediabetic range at 5.7%. This is significant as Abilify can cause hyperglycemia and can also elevate triglycerides and cholesterol. Lipid panel was checked 4/19 and revealed LDL of 80 and triglycerides of 77.  The patient is followed by Psychiatric Nurse Practitioner. She previously had been on Welbutrin , but now is on Remeron 15 mg at bedtime, paroxetine 10 mg at bedtime, and Abilify. Additionally she's on Depakote as a mood stabilizer. Other psychotropic agents include Aricept and Namenda for dementia. The patient has anemia which has remained stable. There is actually been a slight improvement with most recent hemoglobin 11.8 and hematocrit 37. Her thrombocytopenia is also stable to improved.  Review of systems: Dementia invalidated responses. Date given as 1934-11-18. I verified that I was not asking her birthday but the date of today. She still gave me the same answer. Her major complaint is LLE pain. She cannot qualify or quantitate this but describes it as being in the "front of the leg". As she states this she grabs the upper posterior calf. When I pointed this out to her she said "the pain goes from back to front". As I was examining her mouth she stated that she had partials & proceeded to attempt removing such. She is not wearing any partials. She then said "you think I'm stupid". I assured her I did not.  Physical exam:  Pertinent or positive findings: She  seems very anxious as if she were about to cry.  She has bilateral ptosis. She does have multiple missing teeth both of the mandible and the maximal.  There is a grade 1/2 systolic murmur at the right base. Second heart sound is increased. Breath sounds are decreased. She has trace edema of the lower extremities. Pedal pulses are decreased. She has minor crepitus in the L knee without effusion. Homans sign is negative.  General appearance:Adequately nourished; no acute distress , increased work of breathing is present.   Lymphatic: No lymphadenopathy about the head, neck, axilla . Eyes: No conjunctival inflammation or lid edema is present. There is no scleral icterus. Ears:  External ear exam shows no significant lesions or deformities.   Nose:  External nasal examination shows no deformity or inflammation. Nasal mucosa are pink and moist without lesions ,exudates Oral exam: lips and gums are healthy appearing.There is no oropharyngeal erythema or exudate . Neck:  No thyromegaly, masses, tenderness noted.    Heart:  Normal rate and regular rhythm. S1 normal without gallop,  click, rub .  Lungs: without wheezes, rhonchi,rales , rubs. Abdomen:Bowel sounds are normal. Abdomen is soft and nontender with no organomegaly, hernias,masses. GU: deferred  Extremities:  No cyanosis, clubbing  Neurologic exam : Strength equal  in upper & lower extremitiesBut symmetrically decreased  Balance,Rhomberg,finger to nose testing could not be completed due to clinical state Deep tendon reflexes are equal Skin: Warm & dry w/o tenting. No significant lesions or rash.  See summary under  each active problem in the Problem List with associated updated therapeutic plan

## 2017-01-17 NOTE — Patient Instructions (Signed)
See assessment and plan under each diagnosis in the problem list and acutely for this visit 

## 2017-01-17 NOTE — Assessment & Plan Note (Signed)
No change in high dose of L-thyroxine, recheck TSH in early November

## 2017-01-17 NOTE — Assessment & Plan Note (Signed)
Monitor A1c every 4 months 

## 2017-01-17 NOTE — Assessment & Plan Note (Signed)
[facility] will continue to monitor the polypharmacy, clinically patient is stable at this time

## 2017-01-30 ENCOUNTER — Encounter: Payer: Self-pay | Admitting: Adult Health

## 2017-01-30 DIAGNOSIS — E039 Hypothyroidism, unspecified: Secondary | ICD-10-CM | POA: Diagnosis not present

## 2017-01-30 DIAGNOSIS — R946 Abnormal results of thyroid function studies: Secondary | ICD-10-CM | POA: Diagnosis not present

## 2017-01-30 NOTE — Progress Notes (Signed)
This encounter was created in error - please disregard.

## 2017-02-20 ENCOUNTER — Non-Acute Institutional Stay (SKILLED_NURSING_FACILITY): Payer: Medicare Other | Admitting: Adult Health

## 2017-02-20 ENCOUNTER — Encounter: Payer: Self-pay | Admitting: Adult Health

## 2017-02-20 DIAGNOSIS — E782 Mixed hyperlipidemia: Secondary | ICD-10-CM | POA: Diagnosis not present

## 2017-02-20 DIAGNOSIS — F0391 Unspecified dementia with behavioral disturbance: Secondary | ICD-10-CM | POA: Diagnosis not present

## 2017-02-20 DIAGNOSIS — F39 Unspecified mood [affective] disorder: Secondary | ICD-10-CM

## 2017-02-20 DIAGNOSIS — I10 Essential (primary) hypertension: Secondary | ICD-10-CM

## 2017-02-20 DIAGNOSIS — F339 Major depressive disorder, recurrent, unspecified: Secondary | ICD-10-CM

## 2017-02-20 DIAGNOSIS — E039 Hypothyroidism, unspecified: Secondary | ICD-10-CM

## 2017-02-20 NOTE — Progress Notes (Signed)
DATE:  02/20/2017   MRN:  [id number]  BIRTHDAY: January 17, 1935  Facility:  Nursing Home Location:  [facility] Room Number: 214-B  LEVEL OF CARE:  SNF (31)  Contact Information    Name Relation Home Work Mobile   [place]leeville[place]inson,Roberto Son [phone]     Reid,Cynthia Daughter   [phone]   Cox,April Daughter   [phone]       Code Status History    This patient does not have a recorded code status. Please follow your organizational policy for patients in this situation.       Chief Complaint  Patient presents with  . Medical Management of Chronic Issues    Routine visit    HISTORY OF PRESENT ILLNESS:  This is an 82-YO female seen for a routine visit.  She is a [facility]. She has a PMH of dementia, CKD, depression, diabetes mellitus, dyslipidemia, hypothyroidism, and hypertension. She was stable for the past month. She was seen in her room today. She did not verbalize any concerns. She was concerned about her roommate who is sleeping.      PAST MEDICAL HISTORY:  Past Medical History:  Diagnosis Date  . Chronic kidney disease    Stage III  . Dementia with behavioral disturbance 03/19/2015   Notated on FL3 Form from Dr. Dreama SaaJoel Brass [phone]  . Depression    previously saw Dr Donell BeersPlovsky  . Diabetes mellitus without complication (HCC)    11/18/15 A1c 6%  . Dyslipidemia    11/18/15 Tg 74, HDL 60, LDL 90 on statin  . HLD (hyperlipidemia)   . Hypertension   . Hypothyroidism    11/18/15 TSH 53.6, 02/07/16 TSH 9.42 @ [facility]  . MDD (major depressive disorder)    With behavioral disturbance  . Scabies    02/21/16 [facility],[place]; Rx: Permethrin cream  . Vitamin D deficiency    11/18/15 vitamin D 25-hydroxy 39     CURRENT MEDICATIONS: Reviewed  Patient's Medications  New Prescriptions   No medications on file  Previous Medications   ACETAMINOPHEN (TYLENOL) 650 MG CR TABLET     Take 650 mg by mouth every 6 (six) hours as needed for pain.   AMBULATORY NON FORMULARY MEDICATION    Calcium D 600-400 mg unit Sig: Take one tablet by mouth twice daily   ARIPIPRAZOLE (ABILIFY) 5 MG TABLET    Take 5 mg by mouth daily.   ASCORBIC ACID (VITAMIN C) 500 MG TABLET    Take 500 mg by mouth daily.   ASPIRIN EC 81 MG TABLET    Take 81 mg by mouth daily.   ATORVASTATIN (LIPITOR) 40 MG TABLET    Take 40 mg by mouth daily.   CHOLECALCIFEROL (VITAMIN D) 2000 UNITS TABLET    Take 2,000 Units by mouth daily.   DIVALPROEX (DEPAKOTE SPRINKLE) 125 MG CAPSULE    Take three capsules by mouth twice daily   DONEPEZIL (ARICEPT) 10 MG TABLET    Take 10 mg by mouth at bedtime.   FUROSEMIDE (LASIX) 20 MG TABLET    Take 20 mg by mouth daily.   LEVOTHYROXINE (SYNTHROID, LEVOTHROID) 175 MCG TABLET    Take 175 mcg by mouth daily before breakfast.   MEMANTINE (NAMENDA) 10 MG TABLET    Take 10 mg by mouth 2 (two) times daily.   MIRTAZAPINE (REMERON) 15 MG TABLET    Take 15 mg by mouth at bedtime.   MULTIPLE VITAMIN (  MULTI-VITAMIN DAILY PO)    Take one tablet by mouth once daily   PAROXETINE (PAXIL) 10 MG TABLET    Take 10 mg by mouth at bedtime.   POTASSIUM CHLORIDE (K-DUR) 10 MEQ TABLET    Take 10 mEq by mouth daily.   SENNOSIDES-DOCUSATE SODIUM (SENOKOT-S) 8.6-50 MG TABLET    Take 2 tablets by mouth daily.   SKIN PROTECTANTS, MISC. (MINERIN) CREA    Apply topically to ankles twice a day for dry skin.  Modified Medications   No medications on file  Discontinued Medications   No medications on file     No Known Allergies   REVIEW OF SYSTEMS:  Unable to obtain due to dementia    PHYSICAL EXAMINATION  GENERAL APPEARANCE: Well nourished. In no acute distress.  SKIN:  Skin is warm and dry.  MOUTH and THROAT: Lips are without lesions. Oral mucosa is moist and without lesions. Tongue is normal in shape, size, and color and without lesions RESPIRATORY: breathing is even & unlabored, BS  CTAB CARDIAC: RRR, no extra heart sounds, BLE trace edema GI: abdomen soft, normal BS, no masses, no tenderness, no hepatomegaly, no splenomegaly EXTREMITIES:  Able to move X 4 extremities PSYCHIATRIC: Alert to self, disoriented to time and place. Affect and behavior are appropriate   LABS/RADIOLOGY: Labs reviewed: Basic Metabolic Panel:  Recent Labs  16/10/96 08/27/16 10/27/16  NA 139 142 145  K 4.3 4.2 3.9  BUN 22* 20 24*  CREATININE 1.2* 0.9 0.7   Liver Function Tests:  Recent Labs  08/27/16 10/27/16  AST 30 20  ALT 22 7  ALKPHOS 94 50   CBC:  Recent Labs  04/17/16  10/26/16 10/27/16 11/01/16  WBC 4.9  < > 3.5 3.9 3.3  NEUTROABS 2,058  --   --   --   --   HGB 13.1  < > 12.3 11.2* 11.8*  HCT 39  < > 36 36 37  PLT 226  < > 145* 112* 132*  < > = values in this interval not displayed. Lipid Panel:  Recent Labs  05/17/16 10/04/16  HDL 65 48    ASSESSMENT/PLAN:  1. Essential hypertension - well-controlled, Continue Lasix 20 mg 1 tab daily   2. Depression, recurrent (HCC) - stable; continue paroxetine 10 mg 1 tab daily at bedtime, mirtazapine 9:15 milligrams 1 tab daily at bedtime   3. Mood disorder with psychosis (HCC) - stable, continue Abilify 5 mg 1 tab daily and Depakote sprinkles 125 mg daily 3 capsules = 375 mg twice a day, followed up by [facility] NP   4. Mixed hyperlipidemia - Lipitor 40 mg 1 tab daily at bedtime Lab Results  Component Value Date   CHOL 143 10/04/2016   HDL 48 10/04/2016   LDLCALC 80 10/04/2016   TRIG 77 10/04/2016     5. Hypothyroidism, unspecified type - tsh 2.87,  continue levothyroxine 175 g 1 tab daily    6. Dementia with behavioral disturbance, unspecified dementia type - continue supportive care, memantine 10 milligrams 1 tab twice a day      Goals of care:  Lng-term care     Monina C. Medina-Vargas - NP    [employer] [phone]

## 2017-03-02 DIAGNOSIS — R946 Abnormal results of thyroid function studies: Secondary | ICD-10-CM | POA: Diagnosis not present

## 2017-03-02 DIAGNOSIS — M6281 Muscle weakness (generalized): Secondary | ICD-10-CM | POA: Diagnosis not present

## 2017-03-02 DIAGNOSIS — I129 Hypertensive chronic kidney disease with stage 1 through stage 4 chronic kidney disease, or unspecified chronic kidney disease: Secondary | ICD-10-CM | POA: Diagnosis not present

## 2017-03-02 DIAGNOSIS — E039 Hypothyroidism, unspecified: Secondary | ICD-10-CM | POA: Diagnosis not present

## 2017-03-02 DIAGNOSIS — B1710 Acute hepatitis C without hepatic coma: Secondary | ICD-10-CM | POA: Diagnosis not present

## 2017-03-02 LAB — TSH: TSH: 3.35 (ref 0.41–5.90)

## 2017-03-20 ENCOUNTER — Non-Acute Institutional Stay (SKILLED_NURSING_FACILITY): Payer: Medicare Other | Admitting: Adult Health

## 2017-03-20 ENCOUNTER — Encounter: Payer: Self-pay | Admitting: Adult Health

## 2017-03-20 DIAGNOSIS — E782 Mixed hyperlipidemia: Secondary | ICD-10-CM | POA: Diagnosis not present

## 2017-03-20 DIAGNOSIS — I10 Essential (primary) hypertension: Secondary | ICD-10-CM

## 2017-03-20 DIAGNOSIS — E039 Hypothyroidism, unspecified: Secondary | ICD-10-CM | POA: Diagnosis not present

## 2017-03-20 DIAGNOSIS — F0391 Unspecified dementia with behavioral disturbance: Secondary | ICD-10-CM | POA: Diagnosis not present

## 2017-03-20 DIAGNOSIS — F339 Major depressive disorder, recurrent, unspecified: Secondary | ICD-10-CM | POA: Diagnosis not present

## 2017-03-20 DIAGNOSIS — F39 Unspecified mood [affective] disorder: Secondary | ICD-10-CM

## 2017-03-20 NOTE — Progress Notes (Signed)
DATE:  03/20/2017   MRN:  [id number]  BIRTHDAY: 02/17/1935  Facility:  Nursing Home Location:  [facility] Room Number: 214-B  LEVEL OF CARE:  SNF (31)  Contact Information    Name Relation Home Work Mobile   [place] Son [phone]     Reid,Cynthia Daughter   [phone]   Cox,April Daughter   [phone]       Code Status History    This patient does not have a recorded code status. Please follow your organizational policy for patients in this situation.       Chief Complaint  Patient presents with  . Medical Management of Chronic Issues    Routine visit    HISTORY OF PRESENT ILLNESS:  This is an 82-YO female seen for a routine visit.  She is a long-term care resident of [facility]. She has a PMH of dementia, CKD, depression, DM, dyslipidemia, hypothyroidism, and HTN. She was seen in the room today. Latest tsh 3.35, normal.       PAST MEDICAL HISTORY:  Past Medical History:  Diagnosis Date  . Chronic kidney disease    Stage III  . Dementia with behavioral disturbance 03/19/2015   Notated on FL3 Form from Dr. Dreama Saa [phone]  . Depression    previously saw Dr Donell Beers  . Diabetes mellitus without complication (HCC)    11/18/15 A1c 6%  . Dyslipidemia    11/18/15 Tg 74, HDL 60, LDL 90 on statin  . HLD (hyperlipidemia)   . Hypertension   . Hypothyroidism    11/18/15 TSH 53.6, 02/07/16 TSH 9.42 @ [facility]  . MDD (major depressive disorder)    With behavioral disturbance  . Scabies    02/21/16 [facility],[place]; Rx: Permethrin cream  . Vitamin D deficiency    11/18/15 vitamin D 25-hydroxy 39     CURRENT MEDICATIONS: Reviewed  Patient's Medications  New Prescriptions   No medications on file  Previous Medications   ACETAMINOPHEN (TYLENOL) 650 MG CR TABLET    Take 650 mg by mouth every 6 (six) hours as needed for pain.   AMBULATORY NON FORMULARY MEDICATION    Calcium D 600-400 mg  unit Sig: Take one tablet by mouth twice daily   ARIPIPRAZOLE (ABILIFY) 5 MG TABLET    Take 5 mg by mouth daily.   ASCORBIC ACID (VITAMIN C) 500 MG TABLET    Take 500 mg by mouth daily.    ASPIRIN EC 81 MG TABLET    Take 81 mg by mouth daily.   ATORVASTATIN (LIPITOR) 40 MG TABLET    Take 40 mg by mouth daily.   CHOLECALCIFEROL (VITAMIN D) 2000 UNITS TABLET    Take 2,000 Units by mouth daily.   DIVALPROEX (DEPAKOTE SPRINKLE) 125 MG CAPSULE    Take three capsules by mouth twice daily   DONEPEZIL (ARICEPT) 10 MG TABLET    Take 10 mg by mouth at bedtime.   FUROSEMIDE (LASIX) 20 MG TABLET    Take 20 mg by mouth daily.   LEVOTHYROXINE (SYNTHROID, LEVOTHROID) 175 MCG TABLET    Take 175 mcg by mouth daily before breakfast.   MEMANTINE (NAMENDA) 10 MG TABLET    Take 10 mg by mouth 2 (two) times daily.   MIRTAZAPINE (REMERON) 15 MG TABLET    Take 15 mg by mouth at bedtime.   MULTIPLE VITAMIN (MULTI-VITAMIN DAILY PO)    Take one tablet by mouth once daily   PAROXETINE (PAXIL)  10 MG TABLET    Take 10 mg by mouth at bedtime.   POTASSIUM CHLORIDE (K-DUR) 10 MEQ TABLET    Take 10 mEq by mouth daily.   SENNOSIDES-DOCUSATE SODIUM (SENOKOT-S) 8.6-50 MG TABLET    Take 2 tablets by mouth daily.   SKIN PROTECTANTS, MISC. (MINERIN) CREA    Apply topically to ankles twice a day for dry skin.  Modified Medications   No medications on file  Discontinued Medications   No medications on file     No Known Allergies   REVIEW OF SYSTEMS:  Unable to obtain due to Dementia    PHYSICAL EXAMINATION  GENERAL APPEARANCE: Well nourished. In no acute distress. Obese SKIN:  Skin is warm and dry.  MOUTH and THROAT: Lips are without lesions. Oral mucosa is moist and without lesions. RESPIRATORY: breathing is even & unlabored, BS CTAB CARDIAC: RRR, no murmur,no extra heart sounds, no edema GI: abdomen soft, normal BS, no masses, no tenderness EXTREMITIES:  Able to move X 4 extremities PSYCHIATRIC: Alert to self and,  disoriented to time and is place. Affect and behavior are appropriate    LABS/RADIOLOGY: Labs reviewed: Basic Metabolic Panel:  Recent Labs  09/81/19 08/27/16 10/27/16  NA 139 142 145  K 4.3 4.2 3.9  BUN 22* 20 24*  CREATININE 1.2* 0.9 0.7   Liver Function Tests:  Recent Labs  08/27/16 10/27/16  AST 30 20  ALT 22 7  ALKPHOS 94 50   CBC:  Recent Labs  04/17/16  10/26/16 10/27/16 11/01/16  WBC 4.9  < > 3.5 3.9 3.3  NEUTROABS 2,058  --   --   --   --   HGB 13.1  < > 12.3 11.2* 11.8*  HCT 39  < > 36 36 37  PLT 226  < > 145* 112* 132*  < > = values in this interval not displayed. Lipid Panel:  Recent Labs  05/17/16 10/04/16  HDL 65 48    ASSESSMENT/PLAN:  1. Dementia with behavioral disturbance, unspecified dementia type - Continue donepezil 10 mg 1 tab daily at bedtime and memantine 10 mg 1 tab twice a day, fall precautions and supportive care, check CBC   2. Depression, recurrent (HCC) - stable, continue Paroxetine 10 mg 1 tab daily at bedtime and mirtazapine I 15 mg 1 tab daily at bedtime   3. Mixed hyperlipidemia - continue Lipitor 40 mg 1 tab daily at bedtime Lab Results  Component Value Date   CHOL 143 10/04/2016   HDL 48 10/04/2016   LDLCALC 80 10/04/2016   TRIG 77 10/04/2016     4. Hypothyroidism, unspecified type - continue levothyroxine 175 g 1 tab daily Lab Results  Component Value Date   TSH 3.35 03/02/2017     5. Essential hypertension - well controlled, continue Lasix 20 mg 1 tab daily; check BMP   6. Mood disorder with psychosis (HCC) - mood is stable, continue Depakote 125 mg 3 capsules = 375 mg twice a day, Abilify 5 mg 1 tab daily at bedtime, followed up by [facility] psych NP      Goals of care:  Long-term care    Treyton Slimp C. Medina-Vargas - NP    [employer] [phone]

## 2017-03-27 DIAGNOSIS — D649 Anemia, unspecified: Secondary | ICD-10-CM | POA: Diagnosis not present

## 2017-03-27 DIAGNOSIS — N183 Chronic kidney disease, stage 3 (moderate): Secondary | ICD-10-CM | POA: Diagnosis not present

## 2017-03-28 LAB — CBC AND DIFFERENTIAL
HEMATOCRIT: 35 — AB (ref 36–46)
HEMOGLOBIN: 11.4 — AB (ref 12.0–16.0)
NEUTROS ABS: 1
PLATELETS: 145 — AB (ref 150–399)
WBC: 3.3

## 2017-03-28 LAB — BASIC METABOLIC PANEL
BUN: 23 — AB (ref 4–21)
Creatinine: 0.8 (ref 0.5–1.1)
Glucose: 88
Potassium: 4.3 (ref 3.4–5.3)
Sodium: 147 (ref 137–147)

## 2017-05-03 DIAGNOSIS — M79675 Pain in left toe(s): Secondary | ICD-10-CM | POA: Diagnosis not present

## 2017-05-03 DIAGNOSIS — B351 Tinea unguium: Secondary | ICD-10-CM | POA: Diagnosis not present

## 2017-05-03 DIAGNOSIS — M79674 Pain in right toe(s): Secondary | ICD-10-CM | POA: Diagnosis not present

## 2017-05-03 LAB — HM DIABETES FOOT EXAM

## 2017-05-14 ENCOUNTER — Encounter: Payer: Self-pay | Admitting: Internal Medicine

## 2017-05-14 ENCOUNTER — Non-Acute Institutional Stay (SKILLED_NURSING_FACILITY): Payer: Medicare Other | Admitting: Internal Medicine

## 2017-05-14 DIAGNOSIS — F0391 Unspecified dementia with behavioral disturbance: Secondary | ICD-10-CM

## 2017-05-14 DIAGNOSIS — I10 Essential (primary) hypertension: Secondary | ICD-10-CM

## 2017-05-14 DIAGNOSIS — N39498 Other specified urinary incontinence: Secondary | ICD-10-CM

## 2017-05-14 DIAGNOSIS — E782 Mixed hyperlipidemia: Secondary | ICD-10-CM

## 2017-05-14 NOTE — Progress Notes (Signed)
    NURSING HOME LOCATION:  [facility] ROOM NUMBER:  [id number]  CODE STATUS:  Full Code  PCP:  Pecola LawlessHopper, William F, MD  [address] [place] [address]   This is a nursing facility follow up of chronic medical diagnoses  Interim medical record and care since last [facility] visit was updated with review of diagnostic studies and change in clinical status since last visit were documented.  HPI: The patient is a permanent resident of the facility with medical diagnoses of essential hypertension, diabetes with renal complications, hypothyroidism, unspecified dementia with behavioral disturbance, and dyslipidemia. Psychiatry NP has followed the patient and clinically the patient had been stable in reference to her mental status issues. Labs are current, on 10/11 there was minimal prerenal azotemia with a BUN 23. Creatinine was normal  @ 0.8. Anemia has been essentially stable, hemoglobin was 11.4 on 10/11. TSH was therapeutic at 3.35 on 9/15. It had been elevated to 7.78 in July. Her last A1c was 5.7% on 7/28. Lipids were at goal on 10/04/16, repeat would be due in April 2019.  Review of systems: Dementia invalidated responses. She was able to provide her birthdate but not today's date. She describes "something dripping out of me when I exercise". Apparently she's describing urinary incontinence when she getis out of her wheelchair to ambulate up to twice a day according to staff. She states she walks "every other day". She has no other GU symptoms. Staff states that the patient has been more depressed. Often in the afternoon she begins to cry   Physical exam:  Pertinent or positive findings: Facies mimics someone startled or alarmed with a wide-eyed stare. She has ptosis greater on the left than the right. There is slight hirsutism of the chin. Brief grade 1/2 systolic murmur present at the base. Second heart sound is increased. Pedal pulses are decreased. She has 1/2+ pitting  edema.  General appearance:Adequately nourished; no acute distress , increased work of breathing is present.   Lymphatic: No lymphadenopathy about the head, neck, axilla . Eyes: No conjunctival inflammation or lid edema is present. There is no scleral icterus. Ears:  External ear exam shows no significant lesions or deformities.   Nose:  External nasal examination shows no deformity or inflammation. Nasal mucosa are pink and moist without lesions ,exudates Oral exam: lips and gums are healthy appearing.There is no oropharyngeal erythema or exudate . Neck:  No thyromegaly, masses, tenderness noted.    Heart:  No gallop, click, rub .  Lungs: Abdomen:Bowel sounds are normal. Abdomen is soft and nontender with no organomegaly, hernias,masses. GU: deferred  Extremities:  No cyanosis, clubbing Neurologic exam : Strength equal  in upper & lower extremities Balance,Rhomberg,finger to nose testing could not be completed due to clinical state Skin: Warm & dry w/o tenting. No significant lesions or rash.  See summary under each active problem in the Problem List with associated updated therapeutic plan

## 2017-05-14 NOTE — Assessment & Plan Note (Signed)
Recheck lipids mid April 2019

## 2017-05-14 NOTE — Assessment & Plan Note (Addendum)
BP controlled; no indication for antihypertensive medications  

## 2017-05-14 NOTE — Assessment & Plan Note (Addendum)
05/14/17 staff reports increasing depression Aricept and Namenda probably not especially efficacious; Psychiatry opinion requested

## 2017-05-15 NOTE — Patient Instructions (Signed)
See assessment and plan under each acute issue &  diagnosis in the problem list for this visit

## 2017-06-05 ENCOUNTER — Non-Acute Institutional Stay (SKILLED_NURSING_FACILITY): Payer: Medicare Other | Admitting: Adult Health

## 2017-06-05 ENCOUNTER — Encounter: Payer: Self-pay | Admitting: Adult Health

## 2017-06-05 DIAGNOSIS — F0391 Unspecified dementia with behavioral disturbance: Secondary | ICD-10-CM

## 2017-06-05 DIAGNOSIS — F339 Major depressive disorder, recurrent, unspecified: Secondary | ICD-10-CM | POA: Diagnosis not present

## 2017-06-05 DIAGNOSIS — I10 Essential (primary) hypertension: Secondary | ICD-10-CM | POA: Diagnosis not present

## 2017-06-05 DIAGNOSIS — E782 Mixed hyperlipidemia: Secondary | ICD-10-CM

## 2017-06-05 DIAGNOSIS — E039 Hypothyroidism, unspecified: Secondary | ICD-10-CM | POA: Diagnosis not present

## 2017-06-05 NOTE — Progress Notes (Signed)
Location:  [facility] Room Number: 214-B Place of Service:  SNF (31) Provider:  Kenard GowerMedina-Vargas, Allora Bains, NP  Patient Care Team: Pecola LawlessHopper, William F, MD as PCP - General (Internal Medicine)  Extended Emergency Contact Information Primary Emergency Contact: Robinson,Roberto Address: [address]          [place], [place] [address] [place] Home Phone: [phone] Relation: Son Secondary Emergency Contact: Reid,Cynthia  [place] Mobile Phone: [phone] Relation: Daughter  Code Status:  Full Code  Goals of care: Advanced Directive information Advanced Directives 01/17/2017  Does Patient Have a Medical Advance Directive? Yes  Type of Advance Directive (No Data)  Does patient want to make changes to medical advance directive? No - Patient declined  Copy of Healthcare Power of Attorney in Chart? -  Would patient like information on creating a medical advance directive? No - Patient declined     Chief Complaint  Patient presents with  . Medical Management of Chronic Issues    Routine [facility] visit    HPI:  Pt is an 82 y.o. female seen today for medical management of chronic diseases.  She is a long-[facility] care resident of [facility].  She has a PMH dementia, chronic kidney disease, depression, diabetes mellitus, dyslipidemia, hypothyroidism, and hypertension. She was seen in the room today.    Past Medical History:  Diagnosis Date  . Chronic kidney disease    Stage III  . Dementia with behavioral disturbance 03/19/2015   Notated on FL3 Form from Dr. Dreama SaaJoel Brass [phone]#[phone]  . Depression    previously saw Dr Donell BeersPlovsky  . Diabetes mellitus without complication (HCC)    11/18/15 A1c 6%  . Dyslipidemia    11/18/15 Tg 74, HDL 60, LDL 90 on statin  . HLD (hyperlipidemia)   . Hypertension   . Hypothyroidism    11/18/15 TSH 53.6, 02/07/16 TSH 9.42 @ [facility]  . MDD (major depressive disorder)    With  behavioral disturbance  . Scabies    02/21/16 [facility],[place]; Rx: Permethrin cream  . Vitamin D deficiency    11/18/15 vitamin D 25-hydroxy 39   Past Surgical History:  Procedure Laterality Date  . no record     04/10/15 [facility] note: see old chart    No Known Allergies  Outpatient Encounter Medications as of 06/05/2017  Medication Sig  . acetaminophen (TYLENOL) 650 MG CR tablet Take 650 mg by mouth every 6 (six) hours as needed for pain.  Marland Kitchen. AMBULATORY NON FORMULARY MEDICATION Calcium D 600-400 mg unit Sig: Take one tablet by mouth twice daily   . ARIPiprazole (ABILIFY) 5 MG tablet Take 5 mg by mouth daily.  Marland Kitchen. aspirin EC 81 MG tablet Take 81 mg by mouth daily.  Marland Kitchen. atorvastatin (LIPITOR) 40 MG tablet Take 40 mg by mouth daily.   . Cholecalciferol (VITAMIN D) 2000 units tablet Take 2,000 Units by mouth daily.   . divalproex (DEPAKOTE SPRINKLE) 125 MG capsule Take three capsules by mouth twice daily  . donepezil (ARICEPT) 10 MG tablet Take 10 mg by mouth at bedtime.  . furosemide (LASIX) 20 MG tablet Take 20 mg by mouth daily.   Marland Kitchen. levothyroxine (SYNTHROID, LEVOTHROID) 175 MCG tablet Take 175 mcg by mouth daily before breakfast.  . memantine (NAMENDA) 10 MG tablet Take 10 mg by mouth 2 (two) times daily.  . mirtazapine (REMERON) 15 MG tablet Take 15 mg by mouth at bedtime.  . Multiple Vitamin (MULTI-VITAMIN DAILY  PO) Take one tablet by mouth once daily  . PARoxetine (PAXIL) 10 MG tablet Take 10 mg by mouth at bedtime.   . potassium chloride (K-DUR) 10 MEQ tablet Take 10 mEq by mouth daily.   . sennosides-docusate sodium (SENOKOT-S) 8.6-50 MG tablet Take 2 tablets by mouth daily.  . Skin Protectants, Misc. (MINERIN) CREA Apply topically to ankles twice a day for dry skin.   No facility-administered encounter medications on file as of 06/05/2017.     Review of Systems  Unable to obtain due to dementia    Immunization History  Administered Date(s) Administered  .  Influenza-Unspecified 02/17/2016, 04/15/2017  . PPD Test 04/08/2016  . Pneumococcal Conjugate-13 01/11/2017  . Pneumococcal-Unspecified 02/16/2014   Pertinent  Health Maintenance Due  Topic Date Due  . FOOT EXAM  03/20/2018 (Originally 10/12/1944)  . OPHTHALMOLOGY EXAM  03/20/2018 (Originally 10/12/1944)  . URINE MICROALBUMIN  06/29/2017  . HEMOGLOBIN A1C  07/15/2017  . INFLUENZA VACCINE  Completed  . PNA vac Low Risk Adult  Completed  . DEXA SCAN  Discontinued   Fall Risk  01/10/2017 08/07/2016 04/20/2016  Falls in the past year? No Yes Yes  Number falls in past yr: - 2 or more 2 or more  Injury with Fall? - - Yes  Risk Factor Category  - - High Fall Risk      Vitals:   06/05/17 1104  BP: 138/88  Pulse: 60  Resp: 18  Temp: 98.1 F (36.7 C)  TempSrc: Oral  SpO2: 96%  Weight: 211 lb (95.7 kg)  Height: 5\' 9"  (1.753 m)   Body mass index is 31.16 kg/m.  Physical Exam  GENERAL APPEARANCE: Well nourished. In no acute distress. Obese SKIN:  Skin is warm and dry.  MOUTH and THROAT: Lips are without lesions. Oral mucosa is moist and without lesions. RESPIRATORY: Breathing is even & unlabored, BS CTAB CARDIAC: RRR, no murmur,no extra heart sounds, no edema GI: Abdomen soft, normal BS, no masses, no tenderness EXTREMITIES:  Able to move X 4 extremities PSYCHIATRIC: Alert to self, disoriented to time and place. Affect and behavior are appropriate   Labs reviewed: Recent Labs    08/27/16 10/27/16 03/28/17  NA 142 145 147  K 4.2 3.9 4.3  BUN 20 24* 23*  CREATININE 0.9 0.7 0.8   Recent Labs    08/27/16 10/27/16  AST 30 20  ALT 22 7  ALKPHOS 94 50   Recent Labs    10/27/16 11/01/16 03/28/17  WBC 3.9 3.3 3.3  NEUTROABS  --   --  1  HGB 11.2* 11.8* 11.4*  HCT 36 37 35*  PLT 112* 132* 145*   Lab Results  Component Value Date   TSH 3.35 03/02/2017   Lab Results  Component Value Date   HGBA1C 5.7 01/12/2017   Lab Results  Component Value Date   CHOL 143  10/04/2016   HDL 48 10/04/2016   LDLCALC 80 10/04/2016   TRIG 77 10/04/2016    Assessment/Plan  1. Hypothyroidism, unspecified type - continue levothyroxine 175 g 1 tab daily Lab Results  Component Value Date   TSH 3.35 03/02/2017    2. Mixed hyperlipidemia - continue Lipitor 40 mg 1 tab daily at bedtime Lab Results  Component Value Date   CHOL 143 10/04/2016   HDL 48 10/04/2016   LDLCALC 80 10/04/2016   TRIG 77 10/04/2016     3. Dementia with behavioral disturbance, unspecified dementia type - continue supportive care, fall precautions, and  donepezil 10 mg 1 tab daily at bedtime   4. Depression, recurrent (HCC) - continue paroxetine 10 mg 1 tab daily at bedtime, Aripiprazole 5 mg daily, and mirtazapine 15 mg 1 tab daily at bedtime   5. Essential hypertension - continue Lasix 20 mg 1 tab daily     Family/ staff Communication: Discussed plan of care with charge nurse.  Labs/tests ordered:  None  Goals of care:   Long-term care   Kenard Gower, NP [facility] [phone] (Monday-Friday 8:00 a.m. - 5:00 p.m.) [phone] (after hours)

## 2017-06-26 ENCOUNTER — Non-Acute Institutional Stay (SKILLED_NURSING_FACILITY): Payer: Medicare Other | Admitting: Adult Health

## 2017-06-26 ENCOUNTER — Encounter: Payer: Self-pay | Admitting: Adult Health

## 2017-06-26 DIAGNOSIS — E039 Hypothyroidism, unspecified: Secondary | ICD-10-CM | POA: Diagnosis not present

## 2017-06-26 DIAGNOSIS — F39 Unspecified mood [affective] disorder: Secondary | ICD-10-CM | POA: Diagnosis not present

## 2017-06-26 DIAGNOSIS — F339 Major depressive disorder, recurrent, unspecified: Secondary | ICD-10-CM | POA: Diagnosis not present

## 2017-06-26 DIAGNOSIS — Z87898 Personal history of other specified conditions: Secondary | ICD-10-CM

## 2017-06-26 DIAGNOSIS — E782 Mixed hyperlipidemia: Secondary | ICD-10-CM

## 2017-06-26 DIAGNOSIS — E559 Vitamin D deficiency, unspecified: Secondary | ICD-10-CM

## 2017-06-26 DIAGNOSIS — F0391 Unspecified dementia with behavioral disturbance: Secondary | ICD-10-CM

## 2017-06-26 DIAGNOSIS — I10 Essential (primary) hypertension: Secondary | ICD-10-CM | POA: Diagnosis not present

## 2017-06-26 NOTE — Progress Notes (Addendum)
Location:  [facility] Room Number: 214-B Place of Service:  SNF (31) Provider:  Kenard Gower, NP  Patient Care Team: Pecola Lawless, MD as PCP - General (Internal Medicine)  Extended Emergency Contact Information Primary Emergency Contact: Robinson,Roberto Address: [address]          [place], [place] [address] [place] Home Phone: [phone] Relation: Son Secondary Emergency Contact: Reid,Cynthia  [place] Mobile Phone: [phone] Relation: Daughter  Code Status:  Full Code  Goals of care: Advanced Directive information Advanced Directives 01/17/2017  Does Patient Have a Medical Advance Directive? Yes  Type of Advance Directive (No Data)  Does patient want to make changes to medical advance directive? No - Patient declined  Copy of Healthcare Power of Attorney in Chart? -  Would patient like information on creating a medical advance directive? No - Patient declined     Chief Complaint  Patient presents with  . Medical Management of Chronic Issues    Routine [facility] visit    HPI:  Pt is an 83 y.o. female seen today for medical management of chronic diseases.  She is a long-term care resident of [facility].  She has a PMH of dementia, hypothyroidism, and hypertension. She was seen in her room today. She was seen lying in bed.   Past Medical History:  Diagnosis Date  . Chronic kidney disease    Stage III  . Dementia with behavioral disturbance 03/19/2015   Notated on FL3 Form from Dr. Dreama Saa [phone]  . Depression    previously saw Dr Donell Beers  . Diabetes mellitus without complication (HCC)    11/18/15 A1c 6%  . Dyslipidemia    11/18/15 Tg 74, HDL 60, LDL 90 on statin  . HLD (hyperlipidemia)   . Hypertension   . Hypothyroidism    11/18/15 TSH 53.6, 02/07/16 TSH 9.42 @ [facility]  . MDD (major depressive disorder)    With behavioral disturbance  . Scabies    02/21/16 [facility],[place]; Rx: Permethrin cream  . Vitamin D deficiency    11/18/15 vitamin D 25-hydroxy 39   Past Surgical History:  Procedure Laterality Date  . no record     04/10/15 [facility] note: see old chart    No Known Allergies  Outpatient Encounter Medications as of 06/26/2017  Medication Sig  . acetaminophen (TYLENOL) 650 MG CR tablet Take 650 mg by mouth every 6 (six) hours as needed for pain.  Marland Kitchen AMBULATORY NON FORMULARY MEDICATION Calcium D 600-400 mg unit Sig: Take one tablet by mouth twice daily   . ARIPiprazole (ABILIFY) 5 MG tablet Take 5 mg by mouth daily.  Marland Kitchen aspirin EC 81 MG tablet Take 81 mg by mouth daily.  Marland Kitchen atorvastatin (LIPITOR) 40 MG tablet Take 40 mg by mouth daily.   . Cholecalciferol (VITAMIN D) 2000 units tablet Take 2,000 Units by mouth daily.   . divalproex (DEPAKOTE SPRINKLE) 125 MG capsule Take three capsules by mouth twice daily  . donepezil (ARICEPT) 10 MG tablet Take 10 mg by mouth at bedtime.  . furosemide (LASIX) 20 MG tablet Take 20 mg by mouth daily.   Marland Kitchen levothyroxine (SYNTHROID, LEVOTHROID) 175 MCG tablet Take 175 mcg by mouth daily before breakfast.  . memantine (NAMENDA) 10 MG tablet Take 10 mg by mouth 2 (two) times daily.  . mirtazapine (REMERON) 15 MG tablet Take 15 mg by mouth at bedtime.  . Multiple Vitamin (MULTI-VITAMIN DAILY PO) Take  one tablet by mouth once daily  . PARoxetine (PAXIL) 10 MG tablet Take 10 mg by mouth at bedtime.   . potassium chloride (K-DUR) 10 MEQ tablet Take 10 mEq by mouth daily.   . sennosides-docusate sodium (SENOKOT-S) 8.6-50 MG tablet Take 2 tablets by mouth daily.  . Skin Protectants, Misc. (MINERIN) CREA Apply topically to ankles twice a day for dry skin.   No facility-administered encounter medications on file as of 06/26/2017.     Review of Systems  Unable to obtain due to dementia    Immunization History  Administered Date(s) Administered  . Influenza-Unspecified 02/17/2016, 04/15/2017  .  PPD Test 04/08/2016  . Pneumococcal Conjugate-13 01/11/2017  . Pneumococcal-Unspecified 02/16/2014   Pertinent  Health Maintenance Due  Topic Date Due  . OPHTHALMOLOGY EXAM  03/20/2018 (Originally 10/12/1944)  . URINE MICROALBUMIN  06/29/2017  . HEMOGLOBIN A1C  07/15/2017  . FOOT EXAM  05/03/2018  . INFLUENZA VACCINE  Completed  . PNA vac Low Risk Adult  Completed  . DEXA SCAN  Discontinued   Fall Risk  01/10/2017 08/07/2016 04/20/2016  Falls in the past year? No Yes Yes  Number falls in past yr: - 2 or more 2 or more  Injury with Fall? - - Yes  Risk Factor Category  - - High Fall Risk      Vitals:   06/26/17 0851  BP: 133/78  Pulse: 66  Resp: 18  Temp: (!) 97 F (36.1 C)  TempSrc: Oral  SpO2: 96%  Weight: 211 lb 12.8 oz (96.1 kg)  Height: 5\' 9"  (1.753 m)   Body mass index is 31.28 kg/m.  Physical Exam  GENERAL APPEARANCE: Well nourished. In no acute distress. Obese SKIN:  Skin is warm and dry.  MOUTH and THROAT: Lips are without lesions. Oral mucosa is moist and without lesions.  RESPIRATORY: Breathing is even & unlabored, BS CTAB CARDIAC: RRR, no murmur,no extra heart sounds, no edema GI: Abdomen soft, normal BS, no masses, no tenderness EXTREMITIES:  Able to move X 4 extremities PSYCHIATRIC: Alert to self, disoriented to time and place. Affect and behavior are appropriate   Labs reviewed: Recent Labs    08/27/16 10/27/16 03/28/17  NA 142 145 147  K 4.2 3.9 4.3  BUN 20 24* 23*  CREATININE 0.9 0.7 0.8   Recent Labs    08/27/16 10/27/16  AST 30 20  ALT 22 7  ALKPHOS 94 50   Recent Labs    10/27/16 11/01/16 03/28/17  WBC 3.9 3.3 3.3  NEUTROABS  --   --  1  HGB 11.2* 11.8* 11.4*  HCT 36 37 35*  PLT 112* 132* 145*   Lab Results  Component Value Date   TSH 3.35 03/02/2017   Lab Results  Component Value Date   HGBA1C 5.7 01/12/2017   Lab Results  Component Value Date   CHOL 143 10/04/2016   HDL 48 10/04/2016   LDLCALC 80 10/04/2016   TRIG 77  10/04/2016    Assessment/Plan  1. Hypothyroidism, unspecified type - continue levothyroxine 175 g 1 tab daily Lab Results  Component Value Date   TSH 3.35 03/02/2017    2. Vitamin D deficiency - continue vitamin D3 1000 units give 2 tabs = 2000 units daily, check vitamin D level   3. History of prediabetes - will re-check Lab Results  Component Value Date   HGBA1C 5.7 01/12/2017     4. Essential hypertension - continue Lasix 20 mg 1 tab daily  5. Depression, recurrent (HCC) - continue paroxetine 10 mg 1 tab daily and Mirtazapine 15 mg 1 tab daily at bedtime   6. Mixed hyperlipidemia - continue Lipitor 40 mg 1 tab daily at bedtime Lab Results  Component Value Date   CHOL 143 10/04/2016   HDL 48 10/04/2016   LDLCALC 80 10/04/2016   TRIG 77 10/04/2016    7. Dementia with behavioral disturbance, unspecified dementia type - continue donepezil 10 mg 1 tab daily at bedtime and memantine 10 mg 1 tablet twice a day, Aripiprazole 5 mg Q HS supportive care and fall precautions, psych consult   8. Mood disorder with psychosis (HCC) - mood is stable, continue Depakote 125 mg give 3 capsules = 375 mg twice a day    Family/ staff Communication: Discussed plan of care with charge nurse.  Labs/tests ordered:  Vitamin D level and hgbA1c  Goals of care:   Long-term care   Kenard Gower, NP [facility] [phone] (Monday-Friday 8:00 a.m. - 5:00 p.m.) [phone] (after hours)

## 2017-06-27 DIAGNOSIS — R7309 Other abnormal glucose: Secondary | ICD-10-CM | POA: Diagnosis not present

## 2017-06-27 DIAGNOSIS — E119 Type 2 diabetes mellitus without complications: Secondary | ICD-10-CM | POA: Diagnosis not present

## 2017-06-27 DIAGNOSIS — E559 Vitamin D deficiency, unspecified: Secondary | ICD-10-CM | POA: Diagnosis not present

## 2017-06-27 LAB — VITAMIN D 25 HYDROXY (VIT D DEFICIENCY, FRACTURES): Vit D, 25-Hydroxy: 38.12

## 2017-06-27 LAB — HEMOGLOBIN A1C: Hemoglobin A1C: 6.3

## 2017-07-19 DIAGNOSIS — R569 Unspecified convulsions: Secondary | ICD-10-CM | POA: Diagnosis not present

## 2017-07-19 DIAGNOSIS — I10 Essential (primary) hypertension: Secondary | ICD-10-CM | POA: Diagnosis not present

## 2017-07-23 ENCOUNTER — Non-Acute Institutional Stay (SKILLED_NURSING_FACILITY): Payer: Medicare Other | Admitting: Adult Health

## 2017-07-23 ENCOUNTER — Encounter: Payer: Self-pay | Admitting: Adult Health

## 2017-07-23 DIAGNOSIS — E876 Hypokalemia: Secondary | ICD-10-CM

## 2017-07-23 DIAGNOSIS — F339 Major depressive disorder, recurrent, unspecified: Secondary | ICD-10-CM

## 2017-07-23 DIAGNOSIS — R600 Localized edema: Secondary | ICD-10-CM

## 2017-07-23 DIAGNOSIS — E039 Hypothyroidism, unspecified: Secondary | ICD-10-CM

## 2017-07-23 DIAGNOSIS — F39 Unspecified mood [affective] disorder: Secondary | ICD-10-CM | POA: Diagnosis not present

## 2017-07-23 DIAGNOSIS — E559 Vitamin D deficiency, unspecified: Secondary | ICD-10-CM

## 2017-07-23 DIAGNOSIS — E782 Mixed hyperlipidemia: Secondary | ICD-10-CM | POA: Diagnosis not present

## 2017-07-23 DIAGNOSIS — F0391 Unspecified dementia with behavioral disturbance: Secondary | ICD-10-CM

## 2017-07-23 NOTE — Progress Notes (Signed)
Location:  [facility] Room Number: 214-B Place of Service:  SNF (31) Provider:  Kenard GowerMedina-Vargas, Kaius Daino, NP  Patient Care Team: Pecola LawlessHopper, William F, MD as PCP - General (Internal Medicine)  Extended Emergency Contact Information Primary Emergency Contact: Robinson,Roberto Address: [address]          [place], [place] [address] [place] Home Phone: [phone] Relation: Son Secondary Emergency Contact: Reid,Cynthia  [place] Mobile Phone: [phone] Relation: Daughter  Code Status:  Full Code  Goals of care: Advanced Directive information Advanced Directives 01/17/2017  Does Patient Have a Medical Advance Directive? Yes  Type of Advance Directive (No Data)  Does patient want to make changes to medical advance directive? No - Patient declined  Copy of Healthcare Power of Attorney in Chart? -  Would patient like information on creating a medical advance directive? No - Patient declined     Chief Complaint  Patient presents with  . Medical Management of Chronic Issues    Routine [facility] visit    HPI:  Pt is an 83 y.o. female seen today for medical management of chronic diseases. She is a long-term care resident of [facility].  She has a PMH of dementia, hypothyroidism, and hypertension. She was seen in the room today. Latest VPA level is normal - 66 and vitamin D 38.1, sufficient.  Past Medical History:  Diagnosis Date  . Chronic kidney disease    Stage III  . Dementia with behavioral disturbance 03/19/2015   Notated on FL3 Form from Dr. Dreama SaaJoel Brass [phone]  . Depression    previously saw Dr Donell BeersPlovsky  . Diabetes mellitus without complication (HCC)    11/18/15 A1c 6%  . Dyslipidemia    11/18/15 Tg 74, HDL 60, LDL 90 on statin  . HLD (hyperlipidemia)   . Hypertension   . Hypothyroidism    11/18/15 TSH 53.6, 02/07/16 TSH 9.42 @ [facility]  . MDD (major depressive disorder)    With  behavioral disturbance  . Scabies    02/21/16 [facility],[place]; Rx: Permethrin cream  . Vitamin D deficiency    11/18/15 vitamin D 25-hydroxy 39   Past Surgical History:  Procedure Laterality Date  . no record     04/10/15 [facility] note: see old chart    No Known Allergies  Outpatient Encounter Medications as of 07/23/2017  Medication Sig  . acetaminophen (TYLENOL) 650 MG CR tablet Take 650 mg by mouth every 6 (six) hours as needed for pain.  Marland Kitchen. AMBULATORY NON FORMULARY MEDICATION Calcium D 600-400 mg unit Sig: Take one tablet by mouth twice daily   . ARIPiprazole (ABILIFY) 5 MG tablet Take 5 mg by mouth daily.  Marland Kitchen. aspirin EC 81 MG tablet Take 81 mg by mouth daily.  Marland Kitchen. atorvastatin (LIPITOR) 40 MG tablet Take 40 mg by mouth daily.   . Cholecalciferol (VITAMIN D) 2000 units tablet Take 2,000 Units by mouth daily.   . divalproex (DEPAKOTE SPRINKLE) 125 MG capsule 375 mg 2 (two) times daily. Take three capsules by mouth to = 375 mg twice daily  . donepezil (ARICEPT) 10 MG tablet Take 10 mg by mouth at bedtime.  . furosemide (LASIX) 20 MG tablet Take 20 mg by mouth daily.   Marland Kitchen. levothyroxine (SYNTHROID, LEVOTHROID) 175 MCG tablet Take 175 mcg by mouth daily before breakfast.  . memantine (NAMENDA) 10 MG tablet Take 10 mg by mouth 2 (two) times daily.  . mirtazapine (REMERON) 15 MG tablet  Take 15 mg by mouth at bedtime.  . Multiple Vitamin (MULTI-VITAMIN DAILY PO) Take one tablet by mouth once daily  . PARoxetine (PAXIL) 10 MG tablet Take 10 mg by mouth at bedtime.   . potassium chloride (K-DUR) 10 MEQ tablet Take 10 mEq by mouth daily.   . sennosides-docusate sodium (SENOKOT-S) 8.6-50 MG tablet Take 2 tablets by mouth daily.  . Skin Protectants, Misc. (MINERIN) CREA Apply topically to ankles twice a day for dry skin.   No facility-administered encounter medications on file as of 07/23/2017.     Review of Systems  Unable to obtain due to dementia.    Immunization History    Administered Date(s) Administered  . Influenza-Unspecified 02/17/2016, 04/15/2017  . PPD Test 04/08/2016  . Pneumococcal Conjugate-13 01/11/2017  . Pneumococcal-Unspecified 02/16/2014   Pertinent  Health Maintenance Due  Topic Date Due  . URINE MICROALBUMIN  06/29/2017  . OPHTHALMOLOGY EXAM  03/20/2018 (Originally 10/12/1944)  . HEMOGLOBIN A1C  12/25/2017  . FOOT EXAM  05/03/2018  . INFLUENZA VACCINE  Completed  . PNA vac Low Risk Adult  Completed  . DEXA SCAN  Discontinued   Fall Risk  01/10/2017 08/07/2016 04/20/2016  Falls in the past year? No Yes Yes  Number falls in past yr: - 2 or more 2 or more  Injury with Fall? - - Yes  Risk Factor Category  - - High Fall Risk      Vitals:   07/23/17 1102  BP: 132/80  Pulse: 60  Resp: 20  Temp: 99.4 F (37.4 C)  TempSrc: Oral  SpO2: 96%  Weight: 210 lb 12.8 oz (95.6 kg)  Height: 5\' 9"  (1.753 m)   Body mass index is 31.13 kg/m.  Physical Exam  GENERAL APPEARANCE: Well nourished. In no acute distress. Obese SKIN:  Skin is warm and dry.  MOUTH and THROAT: Lips are without lesions. Oral mucosa is moist and without lesions. Tongue is normal in shape, size, and color and without lesions RESPIRATORY: Breathing is even & unlabored, BS CTAB CARDIAC: RRR, no murmur,no extra heart sounds, BLE 1+ edema GI: Abdomen soft, normal BS, no masses, no tenderness  EXTREMITIES: Able to move X 4 extremities PSYCHIATRIC: Alert to self, disoriented to time and place. Affect and behavior are appropriate   Labs reviewed: Recent Labs    08/27/16 10/27/16 03/28/17  NA 142 145 147  K 4.2 3.9 4.3  BUN 20 24* 23*  CREATININE 0.9 0.7 0.8   Recent Labs    08/27/16 10/27/16  AST 30 20  ALT 22 7  ALKPHOS 94 50   Recent Labs    10/27/16 11/01/16 03/28/17  WBC 3.9 3.3 3.3  NEUTROABS  --   --  1  HGB 11.2* 11.8* 11.4*  HCT 36 37 35*  PLT 112* 132* 145*   Lab Results  Component Value Date   TSH 3.35 03/02/2017   Lab Results  Component  Value Date   HGBA1C 6.3 06/27/2017   Lab Results  Component Value Date   CHOL 143 10/04/2016   HDL 48 10/04/2016   LDLCALC 80 10/04/2016   TRIG 77 10/04/2016    Assessment/Plan  1. Mixed hyperlipidemia - continue Lipitor 40 mg 1 tab PO Q HS Lab Results  Component Value Date   CHOL 143 10/04/2016   HDL 48 10/04/2016   LDLCALC 80 10/04/2016   TRIG 77 10/04/2016    2. Bilateral lower extremity edema  - continue Lasix 20 mg 1 tab daily  3. Hypothyroidism, unspecified type Lab Results  Component Value Date   TSH 3.35 03/02/2017     4. Vitamin D deficiency -  Continue Vitamin D 1,000 units give 2 tabs = 2,00 units daily   5. Depression, recurrent (HCC) - continue Aripiprazole 5 mg 1 tab daily and will decrease Mirtazapine from 15 mg to 7.5 mg Q HS since patient has good appetite, and continue paroxetine 10 mg 1 tab daily at bedtime   6. Hypokalemia - continue KCl 10 meq 1 tab daily Lab Results  Component Value Date   K 4.3 03/28/2017     7. Dementia with behavioral disturbance, unspecified dementia type - continue memantine 10 mg 1 tab twice a day, donepezil 10 mg 1 tab daily at bedtime, supportive care and fall precautions   8. Mood disorder - mood is stable, continue Depakote 125 mg give 3 capsules= 375 mg twice a day    Family/ staff Communication: Discussed plan of care with charge nurse.  Labs/tests ordered:  BMP  Goals of care:   Long-term care   Kenard Gower, NP [facility] and [facility] [phone] (Monday-Friday 8:00 a.m. - 5:00 p.m.) [phone] (after hours)

## 2017-07-24 DIAGNOSIS — I10 Essential (primary) hypertension: Secondary | ICD-10-CM | POA: Diagnosis not present

## 2017-07-24 DIAGNOSIS — D649 Anemia, unspecified: Secondary | ICD-10-CM | POA: Diagnosis not present

## 2017-07-24 LAB — BASIC METABOLIC PANEL
BUN: 20 (ref 4–21)
CREATININE: 0.8 (ref 0.5–1.1)
Glucose: 84
POTASSIUM: 4.8 (ref 3.4–5.3)
Sodium: 146 (ref 137–147)

## 2017-08-12 ENCOUNTER — Non-Acute Institutional Stay (SKILLED_NURSING_FACILITY): Payer: Medicare Other | Admitting: Adult Health

## 2017-08-12 ENCOUNTER — Encounter: Payer: Self-pay | Admitting: Adult Health

## 2017-08-12 DIAGNOSIS — E1129 Type 2 diabetes mellitus with other diabetic kidney complication: Secondary | ICD-10-CM | POA: Diagnosis not present

## 2017-08-12 DIAGNOSIS — B370 Candidal stomatitis: Secondary | ICD-10-CM

## 2017-08-12 DIAGNOSIS — E039 Hypothyroidism, unspecified: Secondary | ICD-10-CM | POA: Diagnosis not present

## 2017-08-12 NOTE — Progress Notes (Signed)
Location:  [facility] Room Number: 214-B Place of Service:  SNF (31) Provider:  Kenard Gower, NP  Patient Care Team: Pecola Lawless, MD as PCP - General (Internal Medicine)  Extended Emergency Contact Information Primary Emergency Contact: Robinson,Roberto Address: [address]          [place], [place] [address] [place] Home Phone: [phone] Relation: Son Secondary Emergency Contact: Reid,Cynthia  [place] Mobile Phone: [phone] Relation: Daughter  Code Status:  Full Code  Goals of care: Advanced Directive information Advanced Directives 01/17/2017  Does Patient Have a Medical Advance Directive? Yes  Type of Advance Directive (No Data)  Does patient want to make changes to medical advance directive? No - Patient declined  Copy of Healthcare Power of Attorney in Chart? -  Would patient like information on creating a medical advance directive? No - Patient declined     Chief Complaint  Patient presents with  . Acute Visit    Staff report patient has white coating on tongue that does not clear with brushing.    HPI:  Pt is an 83 y.o. female seen today for medical management of chronic diseases.  She is a long-term care resident of [facility].  She has a PMH of dementia, hypothyroidism, and HTN. She was seen in her room today. She complained that her tongue hurts. Noted to have thick whitish coating on her tongue. No mouth sore noted.     Past Medical History:  Diagnosis Date  . Chronic kidney disease    Stage III  . Dementia with behavioral disturbance 03/19/2015   Notated on FL3 Form from Dr. Dreama Saa [phone]  . Depression    previously saw Dr Donell Beers  . Diabetes mellitus without complication (HCC)    11/18/15 A1c 6%  . Dyslipidemia    11/18/15 Tg 74, HDL 60, LDL 90 on statin  . HLD (hyperlipidemia)   . Hypertension   . Hypothyroidism    11/18/15 TSH 53.6, 02/07/16  TSH 9.42 @ [facility]  . MDD (major depressive disorder)    With behavioral disturbance  . Scabies    02/21/16 [facility],[place]; Rx: Permethrin cream  . Vitamin D deficiency    11/18/15 vitamin D 25-hydroxy 39   Past Surgical History:  Procedure Laterality Date  . no record     04/10/15 [facility] note: see old chart    No Known Allergies  Outpatient Encounter Medications as of 08/12/2017  Medication Sig  . acetaminophen (TYLENOL) 650 MG CR tablet Take 650 mg by mouth every 6 (six) hours as needed for pain.  Marland Kitchen AMBULATORY NON FORMULARY MEDICATION Calcium D 600-400 mg unit Sig: Take one tablet by mouth twice daily   . ARIPiprazole (ABILIFY) 5 MG tablet Take 5 mg by mouth daily.  Marland Kitchen aspirin EC 81 MG tablet Take 81 mg by mouth daily.  Marland Kitchen atorvastatin (LIPITOR) 40 MG tablet Take 40 mg by mouth daily.   . Cholecalciferol (VITAMIN D) 2000 units tablet Take 2,000 Units by mouth daily.   . divalproex (DEPAKOTE SPRINKLE) 125 MG capsule 375 mg 2 (two) times daily. Take three capsules by mouth to = 375 mg twice daily  . donepezil (ARICEPT) 10 MG tablet Take 10 mg by mouth at bedtime.  . furosemide (LASIX) 20 MG tablet Take 20 mg by mouth daily.   Marland Kitchen levothyroxine (SYNTHROID, LEVOTHROID) 175 MCG tablet Take 175 mcg by mouth daily before breakfast.  . memantine (NAMENDA) 10  MG tablet Take 10 mg by mouth 2 (two) times daily.  . mirtazapine (REMERON) 15 MG tablet Take 7.5 mg by mouth at bedtime. Take one-half tablet to = 7.5 mg qhs  . Multiple Vitamin (MULTI-VITAMIN DAILY PO) Take one tablet by mouth once daily  . PARoxetine (PAXIL) 10 MG tablet Take 10 mg by mouth at bedtime.   . potassium chloride (K-DUR) 10 MEQ tablet Take 10 mEq by mouth daily.   . sennosides-docusate sodium (SENOKOT-S) 8.6-50 MG tablet Take 2 tablets by mouth daily.  . Skin Protectants, Misc. (MINERIN) CREA Apply topically to ankles twice a day for dry skin.   No facility-administered encounter medications on file as  of 08/12/2017.     Review of Systems  GENERAL: No change in appetite, no fatigue, no weight changes, no fever, chills or weakness MOUTH and THROAT: + oral pain RESPIRATORY: no cough, SOB, DOE, wheezing, hemoptysis CARDIAC: No chest pain, edema or palpitations GI: No abdominal pain, diarrhea, constipation, heart burn, nausea or vomiting GU: Denies dysuria, frequency, hematuria, incontinence, or discharge PSYCHIATRIC: Denies feelings of depression or anxiety. No report of hallucinations, insomnia, paranoia, or agitation   Immunization History  Administered Date(s) Administered  . Influenza-Unspecified 02/17/2016, 04/15/2017  . PPD Test 04/08/2016  . Pneumococcal Conjugate-13 01/11/2017  . Pneumococcal-Unspecified 02/16/2014   Pertinent  Health Maintenance Due  Topic Date Due  . URINE MICROALBUMIN  06/29/2017  . OPHTHALMOLOGY EXAM  03/20/2018 (Originally 10/12/1944)  . HEMOGLOBIN A1C  12/25/2017  . FOOT EXAM  05/03/2018  . INFLUENZA VACCINE  Completed  . PNA vac Low Risk Adult  Completed  . DEXA SCAN  Discontinued   Fall Risk  01/10/2017 08/07/2016 04/20/2016  Falls in the past year? No Yes Yes  Number falls in past yr: - 2 or more 2 or more  Injury with Fall? - - Yes  Risk Factor Category  - - High Fall Risk      Vitals:   08/12/17 1130  BP: 138/66  Pulse: 68  Resp: 18  Temp: 98.6 F (37 C)  TempSrc: Oral  SpO2: 96%  Weight: 210 lb 12.8 oz (95.6 kg)  Height: 5\' 9"  (1.753 m)   Body mass index is 31.13 kg/m.  Physical Exam  GENERAL APPEARANCE: Well nourished. In no acute distress. Obese SKIN:  Skin is warm and dry.  MOUTH and THROAT:  Noted to have thick whitish coating on tongue RESPIRATORY: Breathing is even & unlabored, BS CTAB CARDIAC: RRR, no murmur,no extra heart sounds, no edema GI: Abdomen soft, normal BS, no masses, no tenderness EXTREMITIES:  Able to move X 4 extremities PSYCHIATRIC: Alert to self, disoriented to time and place. Affect and behavior are  appropriate   Labs reviewed: Recent Labs    10/27/16 03/28/17 07/24/17  NA 145 147 146  K 3.9 4.3 4.8  BUN 24* 23* 20  CREATININE 0.7 0.8 0.8   Recent Labs    08/27/16 10/27/16  AST 30 20  ALT 22 7  ALKPHOS 94 50   Recent Labs    10/27/16 11/01/16 03/28/17  WBC 3.9 3.3 3.3  NEUTROABS  --   --  1  HGB 11.2* 11.8* 11.4*  HCT 36 37 35*  PLT 112* 132* 145*   Lab Results  Component Value Date   TSH 3.35 03/02/2017   Lab Results  Component Value Date   HGBA1C 6.3 06/27/2017   Lab Results  Component Value Date   CHOL 143 10/04/2016   HDL 48 10/04/2016  LDLCALC 80 10/04/2016   TRIG 77 10/04/2016     Assessment/Plan  1. Oral candida - will start on Magic mouthwash 10 ml swab to tongue QID X 2 weeks, oral care daily   2. Type 2 diabetes mellitus with other diabetic kidney complication, without long-term current use of insulin (HCC) - diet controlled Lab Results  Component Value Date   HGBA1C 6.3 06/27/2017     3. Hypothyroidism, unspecified type - continue Levothyroxine 175 mcg 1 tab daily Lab Results  Component Value Date   TSH 3.35 03/02/2017        Kenard GowerMonina Medina-Vargas, NP [facility] [phone] (Monday-Friday 8:00 a.m. - 5:00 p.m.) [phone] (after hours)

## 2017-08-23 DIAGNOSIS — R262 Difficulty in walking, not elsewhere classified: Secondary | ICD-10-CM | POA: Diagnosis not present

## 2017-08-23 DIAGNOSIS — B351 Tinea unguium: Secondary | ICD-10-CM | POA: Diagnosis not present

## 2017-08-23 DIAGNOSIS — I739 Peripheral vascular disease, unspecified: Secondary | ICD-10-CM | POA: Diagnosis not present

## 2017-08-23 LAB — HM DIABETES FOOT EXAM

## 2017-08-29 ENCOUNTER — Non-Acute Institutional Stay (SKILLED_NURSING_FACILITY): Payer: Medicare Other | Admitting: Internal Medicine

## 2017-08-29 ENCOUNTER — Encounter: Payer: Self-pay | Admitting: Internal Medicine

## 2017-08-29 DIAGNOSIS — B370 Candidal stomatitis: Secondary | ICD-10-CM

## 2017-08-29 DIAGNOSIS — E1129 Type 2 diabetes mellitus with other diabetic kidney complication: Secondary | ICD-10-CM

## 2017-08-29 DIAGNOSIS — E039 Hypothyroidism, unspecified: Secondary | ICD-10-CM

## 2017-08-29 DIAGNOSIS — F0391 Unspecified dementia with behavioral disturbance: Secondary | ICD-10-CM

## 2017-08-29 NOTE — Assessment & Plan Note (Signed)
Reassess TSH

## 2017-08-29 NOTE — Progress Notes (Signed)
    NURSING HOME LOCATION:  [facility] ROOM NUMBER:  [id number]  CODE STATUS:  Full Code  PCP:  Pecola LawlessHopper, Jamice Carreno F, MD  [address] [place] [address]  This is a nursing facility follow up of chronic medical diagnoses.  Interim medical record and care since last [facility] visit was updated with review of diagnostic studies and change in clinical status since last visit were documented.  HPI: The patient is a permanent resident of facility with diagnoses of essential hypertension, diabetes with renal complications, hypothyroidism, unspecified dementia with behavioral disturbance, and dyslipidemia. Psychiatry services have been monitoring the patient and prescribing psychotropic agents; she is on Abilify, Aricept, Depakote, Namenda , Paxil and Remeron. The last psychiatric evaluation was 08/20/17 by Jacqulyn BathHattie Clayton, NP of [facility]. Abilify was not listed as active medicine. She was diagnosed as having mood disorder with psychosis which was felt to be stable. Remeron was continued for insomnia and the low-dose Paxil for recurrent depression.  Chemistries are current,renal function is normal. A1c was prediabetic at 6.3%, she is not exhibiting any significant hyperglycemia. She is on a thyroid supplement, TSH was therapeutic in September 2018.  Review of systems: Dementia invalidated responses.Her only complaint is soreness in the mouth. Thankfully her daughter April was present & was able to provide histo The patient had denied having children to the psychiatry nurse practitioner but in fact she has 3 adult children.She identified her daughter but gave her own birth date as the daughter's. Apparently the patient's mental status changes began as irritability and rapid progression of dementia  5 years ago. Apparently detailed neurologic evaluation revealed no evidence of any vascular etiology on MRI. There is also no diagnosis of Parkinson's.   Physical exam:  Pertinent or positive  findings: The most striking finding is her flat affect and masked facies. The dorsum of the tongue is coated. This is somewhat clay colored. She does exhibit intermittent rolling type tremor of the right hand. She has decreased pedal pulses. There is 1/2+ edema at the sock line.  General appearance: Adequately nourished; no acute distress, increased work of breathing is present.   Lymphatic: No lymphadenopathy about the head, neck, axilla. Eyes: No conjunctival inflammation or lid edema is present. There is no scleral icterus. Ears:  External ear exam shows no significant lesions or deformities.   Nose:  External nasal examination shows no deformity or inflammation. Nasal mucosa are pink and moist without lesions, exudates Oral exam:  Lips and gums are healthy appearing. There is no oropharyngeal erythema or exudate. Neck:  No thyromegaly, masses, tenderness noted.    Heart:  Normal rate and regular rhythm. S1 and S2 normal without gallop, murmur, click, rub .  Lungs:Chest clear to auscultation without wheezes, rhonchi,rales , rubs. Abdomen:Bowel sounds are normal. Abdomen is soft and nontender with no organomegaly, hernias,masses. GU: deferred  Extremities:  No cyanosis, clubbing  Neurologic exam : Strength equal  in upper & lower extremities Balance,Rhomberg,finger to nose testing could not be completed due to clinical state Deep tendon reflexes are equal Skin: Warm & dry w/o tenting. No significant lesions or rash.  See summary under each active problem in the Problem List with associated updated therapeutic plan

## 2017-08-29 NOTE — Assessment & Plan Note (Addendum)
No change in psychotropic drugs as per psychiatry Aricept and Namenda probably not be clinically efficacious for this patient @ 1280; but daughter wants to discuss with Psych NP

## 2017-08-29 NOTE — Assessment & Plan Note (Signed)
A1c is current and is prediabetic despite Abilify Continue glucose monitoring

## 2017-08-30 NOTE — Patient Instructions (Signed)
See assessment and plan under each diagnosis in the problem list and acutely for this visit. Total time 29  minutes; greater than 50% of the visit spent counseling patient's daughter  and coordinating care for problems addressed at this encounter

## 2017-09-17 DIAGNOSIS — M6281 Muscle weakness (generalized): Secondary | ICD-10-CM | POA: Diagnosis not present

## 2017-09-17 DIAGNOSIS — I129 Hypertensive chronic kidney disease with stage 1 through stage 4 chronic kidney disease, or unspecified chronic kidney disease: Secondary | ICD-10-CM | POA: Diagnosis not present

## 2017-09-18 DIAGNOSIS — I129 Hypertensive chronic kidney disease with stage 1 through stage 4 chronic kidney disease, or unspecified chronic kidney disease: Secondary | ICD-10-CM | POA: Diagnosis not present

## 2017-09-18 DIAGNOSIS — M6281 Muscle weakness (generalized): Secondary | ICD-10-CM | POA: Diagnosis not present

## 2017-09-19 DIAGNOSIS — M6281 Muscle weakness (generalized): Secondary | ICD-10-CM | POA: Diagnosis not present

## 2017-09-19 DIAGNOSIS — I129 Hypertensive chronic kidney disease with stage 1 through stage 4 chronic kidney disease, or unspecified chronic kidney disease: Secondary | ICD-10-CM | POA: Diagnosis not present

## 2017-09-20 DIAGNOSIS — I129 Hypertensive chronic kidney disease with stage 1 through stage 4 chronic kidney disease, or unspecified chronic kidney disease: Secondary | ICD-10-CM | POA: Diagnosis not present

## 2017-09-20 DIAGNOSIS — M6281 Muscle weakness (generalized): Secondary | ICD-10-CM | POA: Diagnosis not present

## 2017-09-23 DIAGNOSIS — I129 Hypertensive chronic kidney disease with stage 1 through stage 4 chronic kidney disease, or unspecified chronic kidney disease: Secondary | ICD-10-CM | POA: Diagnosis not present

## 2017-09-23 DIAGNOSIS — M6281 Muscle weakness (generalized): Secondary | ICD-10-CM | POA: Diagnosis not present

## 2017-09-24 DIAGNOSIS — I129 Hypertensive chronic kidney disease with stage 1 through stage 4 chronic kidney disease, or unspecified chronic kidney disease: Secondary | ICD-10-CM | POA: Diagnosis not present

## 2017-09-24 DIAGNOSIS — M6281 Muscle weakness (generalized): Secondary | ICD-10-CM | POA: Diagnosis not present

## 2017-09-25 ENCOUNTER — Non-Acute Institutional Stay (SKILLED_NURSING_FACILITY): Payer: Medicare Other | Admitting: Adult Health

## 2017-09-25 ENCOUNTER — Encounter: Payer: Self-pay | Admitting: Adult Health

## 2017-09-25 DIAGNOSIS — M6281 Muscle weakness (generalized): Secondary | ICD-10-CM | POA: Diagnosis not present

## 2017-09-25 DIAGNOSIS — E1129 Type 2 diabetes mellitus with other diabetic kidney complication: Secondary | ICD-10-CM | POA: Diagnosis not present

## 2017-09-25 DIAGNOSIS — E039 Hypothyroidism, unspecified: Secondary | ICD-10-CM | POA: Diagnosis not present

## 2017-09-25 DIAGNOSIS — F39 Unspecified mood [affective] disorder: Secondary | ICD-10-CM | POA: Diagnosis not present

## 2017-09-25 DIAGNOSIS — F0391 Unspecified dementia with behavioral disturbance: Secondary | ICD-10-CM

## 2017-09-25 DIAGNOSIS — E782 Mixed hyperlipidemia: Secondary | ICD-10-CM

## 2017-09-25 DIAGNOSIS — F329 Major depressive disorder, single episode, unspecified: Secondary | ICD-10-CM

## 2017-09-25 DIAGNOSIS — I129 Hypertensive chronic kidney disease with stage 1 through stage 4 chronic kidney disease, or unspecified chronic kidney disease: Secondary | ICD-10-CM | POA: Diagnosis not present

## 2017-09-25 DIAGNOSIS — I10 Essential (primary) hypertension: Secondary | ICD-10-CM

## 2017-09-25 DIAGNOSIS — F32A Depression, unspecified: Secondary | ICD-10-CM

## 2017-09-25 NOTE — Progress Notes (Signed)
Location:  [facility] Room Number: 214-B Place of Service:  SNF (31) Provider:  Kenard Gower, NP  Patient Care Team: Pecola Lawless, MD as PCP - General (Internal Medicine)  Extended Emergency Contact Information Primary Emergency Contact: Robinson,Roberto Address: [address]          [place], [place] [address] [place] Home Phone: [phone] Relation: Son Secondary Emergency Contact: Reid,Cynthia  [place] Mobile Phone: [phone] Relation: Daughter  Code Status:  Full Code  Goals of care: Advanced Directive information Advanced Directives 01/17/2017  Does Patient Have a Medical Advance Directive? Yes  Type of Advance Directive (No Data)  Does patient want to make changes to medical advance directive? No - Patient declined  Copy of Healthcare Power of Attorney in Chart? -  Would patient like information on creating a medical advance directive? No - Patient declined     Chief Complaint  Patient presents with  . Medical Management of Chronic Issues    Routine [facility] visit    HPI:  Pt is an 83 y.o. female seen today for medical management of chronic diseases, as well as a care plan meeting.  She is a long-term care resident of [facility].  She has a PMH of dementia, hypothyroidism, and hypertension. Paxil was recently increased due to increased in episode of crying. She was seen in the room. She is verbally responsive but slow to respond.   Past Medical History:  Diagnosis Date  . Chronic kidney disease    Stage III  . Dementia with behavioral disturbance 03/19/2015   Notated on FL2 Form from Dr. Dreama Saa [phone]  . Depression    previously saw Dr Donell Beers  . Diabetes mellitus without complication (HCC)    11/18/15 A1c 6%  . Dyslipidemia    11/18/15 Tg 74, HDL 60, LDL 90 on statin  . HLD (hyperlipidemia)   . Hypertension   . Hypothyroidism    11/18/15 TSH 53.6,  02/07/16 TSH 9.42 @ [facility]  . MDD (major depressive disorder)    With behavioral disturbance  . Scabies    02/21/16 [facility],[place]; Rx: Permethrin cream  . Vitamin D deficiency    11/18/15 vitamin D 25-hydroxy 39   Past Surgical History:  Procedure Laterality Date  . no record     04/10/15 [facility] note: see old chart    No Known Allergies  Outpatient Encounter Medications as of 09/25/2017  Medication Sig  . acetaminophen (TYLENOL) 650 MG CR tablet Take 650 mg by mouth every 6 (six) hours as needed for pain.  Marland Kitchen AMBULATORY NON FORMULARY MEDICATION Calcium D 600-400 mg unit Sig: Take one tablet by mouth twice daily   . ARIPiprazole (ABILIFY) 5 MG tablet Take 5 mg by mouth daily.  Marland Kitchen aspirin EC 81 MG tablet Take 81 mg by mouth daily.  Marland Kitchen atorvastatin (LIPITOR) 40 MG tablet Take 40 mg by mouth daily.   . Cholecalciferol (VITAMIN D) 2000 units tablet Take 2,000 Units by mouth daily.   . divalproex (DEPAKOTE SPRINKLE) 125 MG capsule 375 mg 2 (two) times daily. Take three capsules by mouth to = 375 mg twice daily  . donepezil (ARICEPT) 10 MG tablet Take 10 mg by mouth at bedtime.  . furosemide (LASIX) 20 MG tablet Take 20 mg by mouth daily.   Marland Kitchen levothyroxine (SYNTHROID, LEVOTHROID) 175 MCG tablet Take 175 mcg by mouth daily before breakfast.  . memantine (NAMENDA) 10 MG tablet Take  10 mg by mouth 2 (two) times daily.   . mirtazapine (REMERON) 15 MG tablet Take 7.5 mg by mouth at bedtime. Take one-half tablet to = 7.5 mg qhs  . Multiple Vitamin (MULTI-VITAMIN DAILY PO) Take one tablet by mouth once daily  . PARoxetine (PAXIL) 10 MG tablet Take 10 mg by mouth at bedtime.   . sennosides-docusate sodium (SENOKOT-S) 8.6-50 MG tablet Take 2 tablets by mouth daily.   . Skin Protectants, Misc. (MINERIN) CREA Apply topically to ankles twice a day for dry skin.  . [DISCONTINUED] potassium chloride (K-DUR) 10 MEQ tablet Take 10 mEq by mouth daily.    No facility-administered encounter  medications on file as of 09/25/2017.     Review of Systems  GENERAL: No change in appetite, no fatigue, no weight changes, no fever, chills or weakness MOUTH and THROAT: Denies oral discomfort, gingival pain or bleeding RESPIRATORY: no cough, SOB, DOE, wheezing, hemoptysis CARDIAC: No chest pain, edema or palpitations GI: No abdominal pain, diarrhea, constipation, heart burn, nausea or vomiting PSYCHIATRIC: Denies feelings of depression or anxiety. No report of hallucinations, insomnia, paranoia, or agitation   Immunization History  Administered Date(s) Administered  . Influenza-Unspecified 02/17/2016, 04/15/2017  . PPD Test 04/08/2016  . Pneumococcal Conjugate-13 01/11/2017  . Pneumococcal-Unspecified 02/16/2014   Pertinent  Health Maintenance Due  Topic Date Due  . OPHTHALMOLOGY EXAM  03/20/2018 (Originally 10/12/1944)  . HEMOGLOBIN A1C  12/25/2017  . INFLUENZA VACCINE  01/16/2018  . FOOT EXAM  08/24/2018  . PNA vac Low Risk Adult  Completed  . URINE MICROALBUMIN  Discontinued  . DEXA SCAN  Discontinued   Fall Risk  01/10/2017 08/07/2016 04/20/2016  Falls in the past year? No Yes Yes  Number falls in past yr: - 2 or more 2 or more  Injury with Fall? - - Yes  Risk Factor Category  - - High Fall Risk    Vitals:   09/25/17 1207  BP: 132/76  Pulse: 77  Resp: 20  Temp: 97.6 F (36.4 C)  TempSrc: Oral  SpO2: 98%  Weight: 210 lb 12.8 oz (95.6 kg)  Height: 5\' 9"  (1.753 m)   Body mass index is 31.13 kg/m.  Physical Exam  GENERAL APPEARANCE: Well nourished. In no acute distress. Obese SKIN:  Skin is warm and dry.  MOUTH and THROAT: Lips are without lesions. Oral mucosa is moist and without lesions. Tongue is normal in shape, size, and color and without lesions RESPIRATORY: Breathing is even & unlabored, BS CTAB CARDIAC: RRR, no murmur,no extra heart sounds, no edema GI: Abdomen soft, normal BS, no masses, no tenderness EXTREMITIES:  Able to move X 4  extremities: PSYCHIATRIC: Alert to self, disoriented to time and place. Affect and behavior are appropriate   Labs reviewed: Recent Labs    10/27/16 03/28/17 07/24/17  NA 145 147 146  K 3.9 4.3 4.8  BUN 24* 23* 20  CREATININE 0.7 0.8 0.8   Recent Labs    10/27/16  AST 20  ALT 7  ALKPHOS 50   Recent Labs    10/27/16 11/01/16 03/28/17  WBC 3.9 3.3 3.3  NEUTROABS  --   --  1  HGB 11.2* 11.8* 11.4*  HCT 36 37 35*  PLT 112* 132* 145*   Lab Results  Component Value Date   TSH 3.35 03/02/2017   Lab Results  Component Value Date   HGBA1C 6.3 06/27/2017   Lab Results  Component Value Date   CHOL 143 10/04/2016  HDL 48 10/04/2016   LDLCALC 80 10/04/2016   TRIG 77 10/04/2016    Assessment/Plan  1. Type 2 diabetes mellitus with other diabetic kidney complication, without long-term current use of insulin (HCC) - diet-controlled Lab Results  Component Value Date   HGBA1C 6.3 06/27/2017     2. Hypothyroidism, unspecified type - continue Levothyroxine 175 mcg daily Lab Results  Component Value Date   TSH 3.35 03/02/2017     3. Chronic depression - discontinue Remeron, Paxil was recently increased from 10 mg to 15 mg daily, followed-up by [facility] NP   4. Mixed hyperlipidemia - continue Lipitor 40 mg Q HS Lab Results  Component Value Date   CHOL 143 10/04/2016   HDL 48 10/04/2016   LDLCALC 80 10/04/2016   TRIG 77 10/04/2016     5. Essential hypertension - well-controlled, continue Lasix 20 mg daily   6. Dementia with behavioral disturbance, unspecified dementia type - continue Donepezil 10 mg Q HS and Memantine 10 mg BID   7. Mood disorder with psychosis (HCC) - stable, continueDepakote Sprinkles 125 mg capsules give 3 capsules = 375 mg BID     Family/ staff Communication: Discussed plan of care with resident and charge nurse.  Labs/tests ordered:  None  Goals of care:   Long-term care   Kenard Gower, NP [facility]  and Adult Medicine [phone] (Monday-Friday 8:00 a.m. - 5:00 p.m.) [phone] (after hours)

## 2017-09-26 DIAGNOSIS — M6281 Muscle weakness (generalized): Secondary | ICD-10-CM | POA: Diagnosis not present

## 2017-09-26 DIAGNOSIS — I129 Hypertensive chronic kidney disease with stage 1 through stage 4 chronic kidney disease, or unspecified chronic kidney disease: Secondary | ICD-10-CM | POA: Diagnosis not present

## 2017-09-28 DIAGNOSIS — M6281 Muscle weakness (generalized): Secondary | ICD-10-CM | POA: Diagnosis not present

## 2017-09-28 DIAGNOSIS — I129 Hypertensive chronic kidney disease with stage 1 through stage 4 chronic kidney disease, or unspecified chronic kidney disease: Secondary | ICD-10-CM | POA: Diagnosis not present

## 2017-09-30 DIAGNOSIS — I129 Hypertensive chronic kidney disease with stage 1 through stage 4 chronic kidney disease, or unspecified chronic kidney disease: Secondary | ICD-10-CM | POA: Diagnosis not present

## 2017-09-30 DIAGNOSIS — M6281 Muscle weakness (generalized): Secondary | ICD-10-CM | POA: Diagnosis not present

## 2017-10-01 DIAGNOSIS — M6281 Muscle weakness (generalized): Secondary | ICD-10-CM | POA: Diagnosis not present

## 2017-10-01 DIAGNOSIS — I129 Hypertensive chronic kidney disease with stage 1 through stage 4 chronic kidney disease, or unspecified chronic kidney disease: Secondary | ICD-10-CM | POA: Diagnosis not present

## 2017-10-02 DIAGNOSIS — I129 Hypertensive chronic kidney disease with stage 1 through stage 4 chronic kidney disease, or unspecified chronic kidney disease: Secondary | ICD-10-CM | POA: Diagnosis not present

## 2017-10-02 DIAGNOSIS — M6281 Muscle weakness (generalized): Secondary | ICD-10-CM | POA: Diagnosis not present

## 2017-10-22 DIAGNOSIS — F39 Unspecified mood [affective] disorder: Secondary | ICD-10-CM | POA: Diagnosis not present

## 2017-10-22 DIAGNOSIS — G309 Alzheimer's disease, unspecified: Secondary | ICD-10-CM | POA: Diagnosis not present

## 2017-10-22 DIAGNOSIS — G4700 Insomnia, unspecified: Secondary | ICD-10-CM | POA: Diagnosis not present

## 2017-10-22 DIAGNOSIS — F339 Major depressive disorder, recurrent, unspecified: Secondary | ICD-10-CM | POA: Diagnosis not present

## 2017-10-23 ENCOUNTER — Encounter: Payer: Self-pay | Admitting: Adult Health

## 2017-10-23 ENCOUNTER — Non-Acute Institutional Stay (SKILLED_NURSING_FACILITY): Payer: Medicare Other | Admitting: Adult Health

## 2017-10-23 DIAGNOSIS — E039 Hypothyroidism, unspecified: Secondary | ICD-10-CM

## 2017-10-23 DIAGNOSIS — B370 Candidal stomatitis: Secondary | ICD-10-CM | POA: Diagnosis not present

## 2017-10-23 DIAGNOSIS — E782 Mixed hyperlipidemia: Secondary | ICD-10-CM | POA: Diagnosis not present

## 2017-10-23 DIAGNOSIS — F39 Unspecified mood [affective] disorder: Secondary | ICD-10-CM | POA: Diagnosis not present

## 2017-10-23 DIAGNOSIS — I10 Essential (primary) hypertension: Secondary | ICD-10-CM

## 2017-10-23 DIAGNOSIS — F0391 Unspecified dementia with behavioral disturbance: Secondary | ICD-10-CM

## 2017-10-23 NOTE — Progress Notes (Signed)
Location:  [facility] Room Number: 214-B Place of Service:  SNF (31) Provider:  Kenard Gower, NP  Patient Care Team: Pecola Lawless, MD as PCP - General (Internal Medicine)  Extended Emergency Contact Information Primary Emergency Contact: Robinson,Roberto Address: [address]          [place], [place] [address] [place] Home Phone: [phone] Relation: Son Secondary Emergency Contact: Reid,Cynthia  [place] Mobile Phone: [phone] Relation: Daughter  Code Status:  Full Code  Goals of care: Advanced Directive information Advanced Directives 01/17/2017  Does Patient Have a Medical Advance Directive? Yes  Type of Advance Directive (No Data)  Does patient want to make changes to medical advance directive? No - Patient declined  Copy of Healthcare Power of Attorney in Chart? -  Would patient like information on creating a medical advance directive? No - Patient declined     Chief Complaint  Patient presents with  . Medical Management of Chronic Issues    Routine [facility] visit    HPI:  Pt is an 83 y.o. female seen today for medical management of chronic diseases.  She is a long-term care resident of [facility].  She has a PMH of dementia, hypothyroidism, and HTN. She was seen in her room today. She was noted to have thick whitish coating on her tongue.     Past Medical History:  Diagnosis Date  . Chronic kidney disease    Stage III  . Dementia with behavioral disturbance 03/19/2015   Notated on FL2 Form from Dr. Dreama Saa [phone]  . Depression    previously saw Dr Donell Beers  . Diabetes mellitus without complication (HCC)    11/18/15 A1c 6%  . Dyslipidemia    11/18/15 Tg 74, HDL 60, LDL 90 on statin  . HLD (hyperlipidemia)   . Hypertension   . Hypothyroidism    11/18/15 TSH 53.6, 02/07/16 TSH 9.42 @ [facility]  . MDD (major depressive disorder)    With behavioral  disturbance  . Scabies    02/21/16 [facility],[place]; Rx: Permethrin cream  . Vitamin D deficiency    11/18/15 vitamin D 25-hydroxy 39   Past Surgical History:  Procedure Laterality Date  . no record     04/10/15 [facility] note: see old chart    No Known Allergies  Outpatient Encounter Medications as of 10/23/2017  Medication Sig  . acetaminophen (TYLENOL) 650 MG CR tablet Take 650 mg by mouth every 6 (six) hours as needed for pain.  Marland Kitchen AMBULATORY NON FORMULARY MEDICATION Calcium D 600-400 mg unit Sig: Take one tablet by mouth twice daily   . ARIPiprazole (ABILIFY) 5 MG tablet Take 5 mg by mouth daily.  Marland Kitchen aspirin EC 81 MG tablet Take 81 mg by mouth daily.   Marland Kitchen atorvastatin (LIPITOR) 40 MG tablet Take 40 mg by mouth daily.   . Cholecalciferol (VITAMIN D) 2000 units tablet Take 2,000 Units by mouth daily.   . clotrimazole (MYCELEX) 10 MG troche 10 mg 5 (five) times daily.  . divalproex (DEPAKOTE SPRINKLE) 125 MG capsule 375 mg 2 (two) times daily. Take three capsules by mouth to = 375 mg twice daily  . donepezil (ARICEPT) 10 MG tablet Take 10 mg by mouth at bedtime.  . furosemide (LASIX) 20 MG tablet Take 20 mg by mouth daily.   Marland Kitchen levothyroxine (SYNTHROID, LEVOTHROID) 175 MCG tablet Take 175 mcg by mouth daily before breakfast.  . memantine (NAMENDA) 10 MG  tablet Take 10 mg by mouth 2 (two) times daily.   . Multiple Vitamin (MULTI-VITAMIN DAILY PO) Take one tablet by mouth once daily  . PARoxetine (PAXIL) 10 MG tablet Take 15 mg by mouth at bedtime. Take 1-1/2 tablets to = 15 mg qd  . sennosides-docusate sodium (SENOKOT-S) 8.6-50 MG tablet Take 2 tablets by mouth daily.   . Skin Protectants, Misc. (MINERIN) CREA Apply topically to ankles twice a day for dry skin.  . [DISCONTINUED] mirtazapine (REMERON) 15 MG tablet Take 7.5 mg by mouth at bedtime. Take one-half tablet to = 7.5 mg qhs   No facility-administered encounter medications on file as of 10/23/2017.     Review of  Systems  GENERAL: No change in appetite, no fatigue, no weight changes, no fever, chills or weakness MOUTH and THROAT: Denies oral discomfort, gingival pain or bleeding RESPIRATORY: no cough, SOB, DOE, wheezing, hemoptysis CARDIAC: No chest pain, edema or palpitations GI: No abdominal pain, diarrhea, constipation, heart burn, nausea or vomiting PSYCHIATRIC: Denies feelings of depression or anxiety. No report of hallucinations, insomnia, paranoia, or agitation   Immunization History  Administered Date(s) Administered  . Influenza-Unspecified 02/17/2016, 04/15/2017  . PPD Test 04/08/2016  . Pneumococcal Conjugate-13 01/11/2017  . Pneumococcal-Unspecified 02/16/2014   Pertinent  Health Maintenance Due  Topic Date Due  . OPHTHALMOLOGY EXAM  03/20/2018 (Originally 10/12/1944)  . HEMOGLOBIN A1C  12/25/2017  . INFLUENZA VACCINE  01/16/2018  . FOOT EXAM  08/24/2018  . PNA vac Low Risk Adult  Completed  . URINE MICROALBUMIN  Discontinued  . DEXA SCAN  Discontinued   Fall Risk  01/10/2017 08/07/2016 04/20/2016  Falls in the past year? No Yes Yes  Number falls in past yr: - 2 or more 2 or more  Injury with Fall? - - Yes  Risk Factor Category  - - High Fall Risk      Vitals:   10/23/17 1322  BP: 130/72  Pulse: 65  Resp: 20  Temp: 97.6 F (36.4 C)  TempSrc: Oral  SpO2: 97%  Weight: 210 lb 3.2 oz (95.3 kg)  Height:  (1.753 m)   Body mass index is 31.04 kg/m.  Physical Exam  GENERAL APPEARANCE: Well nourished. In no acute distress. Obese SKIN:  Skin is warm and dry.  MOUTH and THROAT: Lips are without lesions. Oral mucosa is moist and without lesions. Tongue has thick whitish coating RESPIRATORY: Breathing is even & unlabored, BS CTAB CARDIAC: RRR, no murmur,no extra heart sounds, no edema GI: Abdomen soft, normal BS, no masses, no tenderness EXTREMITIES:  Able to move X 4 extremities PSYCHIATRIC: Alert to self, disoriented to time and place. Affect and behavior are  appropriate   Labs reviewed: Recent Labs    10/27/16 03/28/17 07/24/17  NA 145 147 146  K 3.9 4.3 4.8  BUN 24* 23* 20  CREATININE 0.7 0.8 0.8   Recent Labs    10/27/16  AST 20  ALT 7  ALKPHOS 50   Recent Labs    10/27/16 11/01/16 03/28/17  WBC 3.9 3.3 3.3  NEUTROABS  --   --  1  HGB 11.2* 11.8* 11.4*  HCT 36 37 35*  PLT 112* 132* 145*   Lab Results  Component Value Date   TSH 3.35 03/02/2017   Lab Results  Component Value Date   HGBA1C 6.3 06/27/2017   Lab Results  Component Value Date   CHOL 143 10/04/2016   HDL 48 10/04/2016   LDLCALC 80 10/04/2016  TRIG 77 10/04/2016    Assessment/Plan  1. Oral thrush - continue Clotrimazole 10 mg troche 5X/day X 14 days, oral care daily   2. Hypothyroidism, unspecified type - continue Levothyroxine 175 mcg 1 tab daily Lab Results  Component Value Date   TSH 3.35 03/02/2017     3. Essential hypertension - well-controlled, continue Lasix 20 mg daily   4. Mixed hyperlipidemia - continue Lipitor 40 mg Q HS Lab Results  Component Value Date   CHOL 143 10/04/2016   HDL 48 10/04/2016   LDLCALC 80 10/04/2016   TRIG 77 10/04/2016     5. Mood disorder with psychosis (HCC) - mood is stable, continue Paroxetine 10 mg give 1 1/2 tab = 15 mg daily, Depakote 125 mg give 3 capsules = 375 mg BID and Aripiprazole 5 mg daily   6. Dementia with behavioral disturbance, unspecified dementia type - continue Donepezil 10 mg Q HS and Memantin 10 mg BID, supportive care, fall precautions    Family/ staff Communication: Discussed plan of care with resident and charge nurse.  Labs/tests ordered:  None  Goals of care:   Long-term care   Kenard Gower, NP [facility] and Adult Medicine [phone] (Monday-Friday 8:00 a.m. - 5:00 p.m.) [phone] (after hours)

## 2017-11-22 DIAGNOSIS — I739 Peripheral vascular disease, unspecified: Secondary | ICD-10-CM | POA: Diagnosis not present

## 2017-11-22 DIAGNOSIS — B351 Tinea unguium: Secondary | ICD-10-CM | POA: Diagnosis not present

## 2017-11-22 DIAGNOSIS — R262 Difficulty in walking, not elsewhere classified: Secondary | ICD-10-CM | POA: Diagnosis not present

## 2017-12-03 ENCOUNTER — Non-Acute Institutional Stay (SKILLED_NURSING_FACILITY): Payer: Medicare Other | Admitting: Internal Medicine

## 2017-12-03 ENCOUNTER — Encounter: Payer: Self-pay | Admitting: Internal Medicine

## 2017-12-03 DIAGNOSIS — F0391 Unspecified dementia with behavioral disturbance: Secondary | ICD-10-CM | POA: Diagnosis not present

## 2017-12-03 DIAGNOSIS — N189 Chronic kidney disease, unspecified: Secondary | ICD-10-CM | POA: Diagnosis not present

## 2017-12-03 DIAGNOSIS — E1129 Type 2 diabetes mellitus with other diabetic kidney complication: Secondary | ICD-10-CM

## 2017-12-03 DIAGNOSIS — I10 Essential (primary) hypertension: Secondary | ICD-10-CM | POA: Diagnosis not present

## 2017-12-03 NOTE — Patient Instructions (Signed)
See assessment and plan under each diagnosis in the problem list and acutely for this visit 

## 2017-12-03 NOTE — Assessment & Plan Note (Signed)
BP controlled; no change in antihypertensive medications  

## 2017-12-03 NOTE — Assessment & Plan Note (Signed)
07/24/17 renal function normal

## 2017-12-03 NOTE — Progress Notes (Signed)
    NURSING HOME LOCATION:  [facility] ROOM NUMBER:  [id number]  CODE STATUS:  Full Code  PCP:  Pecola LawlessHopper, Neely Kammerer F, MD  [address] [place] [id number]  This is a nursing facility follow of chronic medical diagnoses   Interim medical record and care since [facility] was updated with review of diagnostic studies and change in clinical status since last visit were documented.  HPI: The patient is permanent resident of SNF with medical diagnoses of dyslipidemia, unspecified dementia with behavioral disturbance, diabetes with nephropathy, hypothyroidism, and essential hypertension. She continues to be monitored by psychiatry services ([facility]) & is on multiple psychotropic agents. Her mood disorder with psychosis is felt to be clinically stable. She was also on Remeron for insomnia. Her most recent chemistries were 07/24/17 and all were normal. A1c has been prediabetic.  Review of systems: Her only complaint is "can't sleep". She could not provide additional explanation. When I attempted to complete the review of systems, her response was "don't have answers for that, Honey".  Physical exam:  Pertinent or positive findings: Affect is flat but she becomes anxious and agitated when she is questioned. Ptosis greater on the left than the right. Heart rate is slow. Pedal pulses are decreased. There is trace edema at ankles. She can move all extremities but strength cannot be tested as she had marked difficulty following commands.  General appearance: Adequately nourished; no acute distress, increased work of breathing is present.   Lymphatic: No lymphadenopathy about the head, neck, axilla. Eyes: No conjunctival inflammation or lid edema is present. There is no scleral icterus. Ears:  External ear exam shows no significant lesions or deformities.   Nose:  External nasal examination shows no deformity or inflammation. Nasal mucosa are pink and moist without lesions,  exudates Oral exam:  Lips and gums are healthy appearing. There is no oropharyngeal erythema or exudate. Neck:  No thyromegaly, masses, tenderness noted.    Heart:  without gallop, murmur, click, rub  Lungs: Chest clear to auscultation without wheezes, rhonchi, rales, rubs. Abdomen: Bowel sounds are normal. Abdomen is soft and nontender with no organomegaly, hernias, masses. GU: Deferred  Extremities:  No cyanosis, clubbing  Neurologic exam Balance, Rhomberg, finger to nose testing could not be completed due to clinical state Skin: Warm & dry w/o tenting. No significant lesions or rash.  See summary under each active problem in the Problem List with associated updated therapeutic plan

## 2017-12-03 NOTE — Assessment & Plan Note (Signed)
06/27/17 A1c 6.3, prediabetic

## 2017-12-03 NOTE — Assessment & Plan Note (Addendum)
Psychiatry to continue management of psychotropic agents; last psychiatry note 09/19/17 indicated Paxil was increased to 15 mg at bedtime because of increased crying spells 12/03/17 Psych feels she is clinically stable; no change in meds

## 2017-12-11 DIAGNOSIS — I129 Hypertensive chronic kidney disease with stage 1 through stage 4 chronic kidney disease, or unspecified chronic kidney disease: Secondary | ICD-10-CM | POA: Diagnosis not present

## 2017-12-11 DIAGNOSIS — R488 Other symbolic dysfunctions: Secondary | ICD-10-CM | POA: Diagnosis not present

## 2017-12-12 DIAGNOSIS — I129 Hypertensive chronic kidney disease with stage 1 through stage 4 chronic kidney disease, or unspecified chronic kidney disease: Secondary | ICD-10-CM | POA: Diagnosis not present

## 2017-12-12 DIAGNOSIS — R488 Other symbolic dysfunctions: Secondary | ICD-10-CM | POA: Diagnosis not present

## 2017-12-13 DIAGNOSIS — R488 Other symbolic dysfunctions: Secondary | ICD-10-CM | POA: Diagnosis not present

## 2017-12-13 DIAGNOSIS — I129 Hypertensive chronic kidney disease with stage 1 through stage 4 chronic kidney disease, or unspecified chronic kidney disease: Secondary | ICD-10-CM | POA: Diagnosis not present

## 2017-12-15 DIAGNOSIS — I129 Hypertensive chronic kidney disease with stage 1 through stage 4 chronic kidney disease, or unspecified chronic kidney disease: Secondary | ICD-10-CM | POA: Diagnosis not present

## 2017-12-15 DIAGNOSIS — R488 Other symbolic dysfunctions: Secondary | ICD-10-CM | POA: Diagnosis not present

## 2017-12-17 DIAGNOSIS — I129 Hypertensive chronic kidney disease with stage 1 through stage 4 chronic kidney disease, or unspecified chronic kidney disease: Secondary | ICD-10-CM | POA: Diagnosis not present

## 2017-12-17 DIAGNOSIS — R488 Other symbolic dysfunctions: Secondary | ICD-10-CM | POA: Diagnosis not present

## 2017-12-18 DIAGNOSIS — I129 Hypertensive chronic kidney disease with stage 1 through stage 4 chronic kidney disease, or unspecified chronic kidney disease: Secondary | ICD-10-CM | POA: Diagnosis not present

## 2017-12-18 DIAGNOSIS — R488 Other symbolic dysfunctions: Secondary | ICD-10-CM | POA: Diagnosis not present

## 2017-12-19 DIAGNOSIS — R488 Other symbolic dysfunctions: Secondary | ICD-10-CM | POA: Diagnosis not present

## 2017-12-19 DIAGNOSIS — I129 Hypertensive chronic kidney disease with stage 1 through stage 4 chronic kidney disease, or unspecified chronic kidney disease: Secondary | ICD-10-CM | POA: Diagnosis not present

## 2017-12-23 DIAGNOSIS — R488 Other symbolic dysfunctions: Secondary | ICD-10-CM | POA: Diagnosis not present

## 2017-12-23 DIAGNOSIS — I129 Hypertensive chronic kidney disease with stage 1 through stage 4 chronic kidney disease, or unspecified chronic kidney disease: Secondary | ICD-10-CM | POA: Diagnosis not present

## 2017-12-24 DIAGNOSIS — F39 Unspecified mood [affective] disorder: Secondary | ICD-10-CM | POA: Diagnosis not present

## 2017-12-24 DIAGNOSIS — F0280 Dementia in other diseases classified elsewhere without behavioral disturbance: Secondary | ICD-10-CM | POA: Diagnosis not present

## 2017-12-24 DIAGNOSIS — I129 Hypertensive chronic kidney disease with stage 1 through stage 4 chronic kidney disease, or unspecified chronic kidney disease: Secondary | ICD-10-CM | POA: Diagnosis not present

## 2017-12-24 DIAGNOSIS — R488 Other symbolic dysfunctions: Secondary | ICD-10-CM | POA: Diagnosis not present

## 2017-12-24 DIAGNOSIS — G4700 Insomnia, unspecified: Secondary | ICD-10-CM | POA: Diagnosis not present

## 2017-12-24 DIAGNOSIS — F339 Major depressive disorder, recurrent, unspecified: Secondary | ICD-10-CM | POA: Diagnosis not present

## 2017-12-25 DIAGNOSIS — I129 Hypertensive chronic kidney disease with stage 1 through stage 4 chronic kidney disease, or unspecified chronic kidney disease: Secondary | ICD-10-CM | POA: Diagnosis not present

## 2017-12-25 DIAGNOSIS — R488 Other symbolic dysfunctions: Secondary | ICD-10-CM | POA: Diagnosis not present

## 2017-12-26 DIAGNOSIS — R488 Other symbolic dysfunctions: Secondary | ICD-10-CM | POA: Diagnosis not present

## 2017-12-26 DIAGNOSIS — I129 Hypertensive chronic kidney disease with stage 1 through stage 4 chronic kidney disease, or unspecified chronic kidney disease: Secondary | ICD-10-CM | POA: Diagnosis not present

## 2017-12-27 DIAGNOSIS — I129 Hypertensive chronic kidney disease with stage 1 through stage 4 chronic kidney disease, or unspecified chronic kidney disease: Secondary | ICD-10-CM | POA: Diagnosis not present

## 2017-12-27 DIAGNOSIS — R488 Other symbolic dysfunctions: Secondary | ICD-10-CM | POA: Diagnosis not present

## 2017-12-30 DIAGNOSIS — I129 Hypertensive chronic kidney disease with stage 1 through stage 4 chronic kidney disease, or unspecified chronic kidney disease: Secondary | ICD-10-CM | POA: Diagnosis not present

## 2017-12-30 DIAGNOSIS — R488 Other symbolic dysfunctions: Secondary | ICD-10-CM | POA: Diagnosis not present

## 2017-12-31 DIAGNOSIS — I129 Hypertensive chronic kidney disease with stage 1 through stage 4 chronic kidney disease, or unspecified chronic kidney disease: Secondary | ICD-10-CM | POA: Diagnosis not present

## 2017-12-31 DIAGNOSIS — R488 Other symbolic dysfunctions: Secondary | ICD-10-CM | POA: Diagnosis not present

## 2018-01-01 DIAGNOSIS — R488 Other symbolic dysfunctions: Secondary | ICD-10-CM | POA: Diagnosis not present

## 2018-01-01 DIAGNOSIS — I129 Hypertensive chronic kidney disease with stage 1 through stage 4 chronic kidney disease, or unspecified chronic kidney disease: Secondary | ICD-10-CM | POA: Diagnosis not present

## 2018-01-03 ENCOUNTER — Non-Acute Institutional Stay (SKILLED_NURSING_FACILITY): Payer: Medicare Other | Admitting: Adult Health

## 2018-01-03 ENCOUNTER — Encounter: Payer: Self-pay | Admitting: Adult Health

## 2018-01-03 DIAGNOSIS — E1129 Type 2 diabetes mellitus with other diabetic kidney complication: Secondary | ICD-10-CM | POA: Diagnosis not present

## 2018-01-03 DIAGNOSIS — F329 Major depressive disorder, single episode, unspecified: Secondary | ICD-10-CM

## 2018-01-03 DIAGNOSIS — I10 Essential (primary) hypertension: Secondary | ICD-10-CM | POA: Diagnosis not present

## 2018-01-03 DIAGNOSIS — E039 Hypothyroidism, unspecified: Secondary | ICD-10-CM | POA: Diagnosis not present

## 2018-01-03 DIAGNOSIS — F0391 Unspecified dementia with behavioral disturbance: Secondary | ICD-10-CM

## 2018-01-03 DIAGNOSIS — F32A Depression, unspecified: Secondary | ICD-10-CM

## 2018-01-03 NOTE — Progress Notes (Signed)
Location:  [facility] Room Number: 214-B Place of Service:  SNF (31) Provider:  Kenard GowerMedina-Vargas, Trevonne Nyland, NP  Patient Care Team: Pecola LawlessHopper, William F, MD as PCP - General (Internal Medicine)  Extended Emergency Contact Information Primary Emergency Contact: Robinson,Roberto Address: [address]          [place], [place] [address] [place] Home Phone: [phone] Relation: Son Secondary Emergency Contact: Reid,Cynthia  [place] Mobile Phone: [phone] Relation: Daughter  Code Status:  Full Code  Goals of care: Advanced Directive information Advanced Directives 01/17/2017  Does Patient Have a Medical Advance Directive? Yes  Type of Advance Directive (No Data)  Does patient want to make changes to medical advance directive? No - Patient declined  Copy of Healthcare Power of Attorney in Chart? -  Would patient like information on creating a medical advance directive? No - Patient declined     Chief Complaint  Patient presents with  . Medical Management of Chronic Issues    The patient is seen [facility].      HPI:  Pt is an 83 y.o. female seen today for medical management of chronic diseases.  She is a [facility] care [facility].  She has a PMH of dementia, hypothyroidism, and hypertension. She was seen in her room today. She was asleep and woke up upon verbal greeting. She is currently on a restorative feeding program.     Past Medical History:  Diagnosis Date  . Chronic kidney disease    Stage III  . Dementia with behavioral disturbance 03/19/2015   Notated on FL2 Form from Dr. Dreama SaaJoel Brass [phone]  . Depression    previously saw Dr Donell BeersPlovsky  . Diabetes mellitus without complication (HCC)    11/18/15 A1c 6%  . Dyslipidemia    11/18/15 Tg 74, HDL 60, LDL 90 on statin  . HLD (hyperlipidemia)   . Hypertension   . Hypothyroidism    11/18/15 TSH 53.6, 02/07/16 TSH  9.42 @ [facility]  . MDD (major depressive disorder)    With behavioral disturbance  . Scabies    02/21/16 [facility],[place]; Rx: Permethrin cream  . Vitamin D deficiency    11/18/15 vitamin D 25-hydroxy 39   Past Surgical History:  Procedure Laterality Date  . no record     04/10/15 [facility] note: see old chart    No Known Allergies  Outpatient Encounter Medications as of 01/03/2018  Medication Sig  . acetaminophen (TYLENOL) 650 MG CR tablet Take 650 mg by mouth every 6 (six) hours as needed for pain.  . ARIPiprazole (ABILIFY) 5 MG tablet Take 5 mg by mouth daily.  Marland Kitchen. aspirin EC 81 MG tablet Take 81 mg by mouth daily.   Marland Kitchen. atorvastatin (LIPITOR) 40 MG tablet Take 40 mg by mouth daily.   . Calcium Carb-Cholecalciferol (CALCIUM-VITAMIN D3) 600-400 MG-UNIT TABS Take 1 tablet by mouth 2 (two) times daily.  . Cholecalciferol (VITAMIN D) 2000 units tablet Take 2,000 Units by mouth daily.   . divalproex (DEPAKOTE SPRINKLE) 125 MG capsule 375 mg 2 (two) times daily. Take three capsules by mouth to = 375 mg twice daily  . donepezil (ARICEPT) 10 MG tablet Take 10 mg by mouth at bedtime.  . furosemide (LASIX) 20 MG tablet Take 20 mg by mouth daily.   Marland Kitchen. levothyroxine (SYNTHROID, LEVOTHROID) 175 MCG tablet Take 175 mcg by mouth daily before breakfast.  . memantine (NAMENDA) 10 MG tablet  Take 10 mg by mouth 2 (two) times daily.   . Multiple Vitamin (MULTI-VITAMIN DAILY PO) Take one tablet by mouth once daily   . PARoxetine (PAXIL) 10 MG tablet Take 15 mg by mouth at bedtime. Take 1-1/2 tablets to = 15 mg qd  . sennosides-docusate sodium (SENOKOT-S) 8.6-50 MG tablet Take 2 tablets by mouth daily.   . Skin Protectants, Misc. (MINERIN) CREA Apply topically to ankles twice a day for dry skin.   No facility-administered encounter medications on file as of 01/03/2018.     Review of Systems  GENERAL: No change in appetite, no fatigue, no weight changes, no fever, chills or weakness MOUTH  and THROAT: Denies oral discomfort, gingival pain or bleeding RESPIRATORY: no cough, SOB, DOE, wheezing, hemoptysis CARDIAC: No chest pain, or palpitations GI: No abdominal pain, diarrhea, constipation, heart burn, nausea or vomiting GU: Denies dysuria, frequency, hematuria, incontinence, or discharge PSYCHIATRIC: Denies feelings of depression or anxiety. No report of hallucinations, insomnia, paranoia, or agitation    Immunization History  Administered Date(s) Administered  . Influenza-Unspecified 02/17/2016, 04/15/2017  . PPD Test 04/08/2016  . Pneumococcal Conjugate-13 01/11/2017  . Pneumococcal-Unspecified 02/16/2014   Pertinent  Health Maintenance Due  Topic Date Due  . HEMOGLOBIN A1C  12/25/2017  . OPHTHALMOLOGY EXAM  03/20/2018 (Originally 10/12/1944)  . INFLUENZA VACCINE  01/16/2018  . FOOT EXAM  08/24/2018  . PNA vac Low Risk Adult  Completed  . URINE MICROALBUMIN  Discontinued  . DEXA SCAN  Discontinued   Fall Risk  01/10/2017 08/07/2016 04/20/2016  Falls in the past year? No Yes Yes  Number falls in past yr: - 2 or more 2 or more  Injury with Fall? - - Yes  Risk Factor Category  - - High Fall Risk     Vitals:   01/03/18 1545  BP: 129/74  Pulse: 71  Resp: 18  Temp: 97.8 F (36.6 C)  TempSrc: Oral  SpO2: 97%  Weight: 210 lb (95.3 kg)  Height: 5\' 9"  (1.753 m)   Body mass index is 31.01 kg/m.  Physical Exam  GENERAL APPEARANCE: Well nourished. In no acute distress. Obese SKIN:  Skin is warm and dry.  MOUTH and THROAT: Lips are without lesions. Oral mucosa is moist and without lesions. Tongue is normal in shape, size, and color and without lesions RESPIRATORY: Breathing is even & unlabored, BS CTAB CARDIAC: RRR, no murmur,no extra heart sounds, BLE 1+ edema GI: Abdomen soft, normal BS, no masses, no tenderness EXTREMITIES:  Able to move X 4 extremities PSYCHIATRIC: Alert to self, disoriented to time and place. Affect and behavior are appropriate   Labs  reviewed: Recent Labs    03/28/17 07/24/17  NA 147 146  K 4.3 4.8  BUN 23* 20  CREATININE 0.8 0.8    Recent Labs    03/28/17  WBC 3.3  NEUTROABS 1  HGB 11.4*  HCT 35*  PLT 145*   Lab Results  Component Value Date   TSH 3.35 03/02/2017   Lab Results  Component Value Date   HGBA1C 6.3 06/27/2017   Lab Results  Component Value Date   CHOL 143 10/04/2016   HDL 48 10/04/2016   LDLCALC 80 10/04/2016   TRIG 77 10/04/2016    Assessment/Plan  1. Hypothyroidism, unspecified type - continue Levothyroxine 175 mcg 1 tab daily, re-check tsh Lab Results  Component Value Date   TSH 3.35 03/02/2017     2. Essential hypertension - well-controlled, continue Lasix 20 mg daily  3. Type 2 diabetes mellitus with other diabetic kidney complication, without long-term current use of insulin (HCC) - diet-controlled Lab Results  Component Value Date   HGBA1C 6.3 06/27/2017     4. Chronic depression - no noted crying spells, continue Paroxetine 15 mg daily and Aripiprazole 5 mg daily   5. Dementia with behavioral disturbance, unspecified dementia type - continue Donepezil 10 mg Q HS and Memantine 10 mg BID, continue supportive care and fall precautions    Family/ staff Communication: Discussed plan of care with resident.  Labs/tests ordered:  HgbA1c, tsh, valpric acid level, vitamin D level, CBC and CMP  Goals of care:   Long-term care.   Kenard Gower, NP [facility] [phone] (Monday-Friday 8:00 a.m. - 5:00 p.m.) [phone] (after hours)\

## 2018-01-04 DIAGNOSIS — R946 Abnormal results of thyroid function studies: Secondary | ICD-10-CM | POA: Diagnosis not present

## 2018-01-04 DIAGNOSIS — E559 Vitamin D deficiency, unspecified: Secondary | ICD-10-CM | POA: Diagnosis not present

## 2018-01-04 DIAGNOSIS — I10 Essential (primary) hypertension: Secondary | ICD-10-CM | POA: Diagnosis not present

## 2018-01-04 DIAGNOSIS — E039 Hypothyroidism, unspecified: Secondary | ICD-10-CM | POA: Diagnosis not present

## 2018-01-04 DIAGNOSIS — M6281 Muscle weakness (generalized): Secondary | ICD-10-CM | POA: Diagnosis not present

## 2018-01-04 DIAGNOSIS — E119 Type 2 diabetes mellitus without complications: Secondary | ICD-10-CM | POA: Diagnosis not present

## 2018-01-04 DIAGNOSIS — R569 Unspecified convulsions: Secondary | ICD-10-CM | POA: Diagnosis not present

## 2018-01-04 DIAGNOSIS — R7309 Other abnormal glucose: Secondary | ICD-10-CM | POA: Diagnosis not present

## 2018-01-04 DIAGNOSIS — D649 Anemia, unspecified: Secondary | ICD-10-CM | POA: Diagnosis not present

## 2018-01-04 DIAGNOSIS — N183 Chronic kidney disease, stage 3 (moderate): Secondary | ICD-10-CM | POA: Diagnosis not present

## 2018-01-04 LAB — BASIC METABOLIC PANEL
BUN: 20 (ref 4–21)
CREATININE: 0.8 (ref 0.5–1.1)
Glucose: 102
POTASSIUM: 4.3 (ref 3.4–5.3)
SODIUM: 142 (ref 137–147)

## 2018-01-04 LAB — CBC AND DIFFERENTIAL
HEMATOCRIT: 37 (ref 36–46)
HEMOGLOBIN: 12.4 (ref 12.0–16.0)
Neutrophils Absolute: 1
Platelets: 124 — AB (ref 150–399)
WBC: 3.5

## 2018-01-04 LAB — HEPATIC FUNCTION PANEL
ALK PHOS: 69 (ref 25–125)
ALT: 9 (ref 7–35)
AST: 18 (ref 13–35)
Bilirubin, Total: 0.2

## 2018-01-04 LAB — HEMOGLOBIN A1C: Hemoglobin A1C: 5.9

## 2018-01-04 LAB — VITAMIN D 25 HYDROXY (VIT D DEFICIENCY, FRACTURES): Vit D, 25-Hydroxy: 52.93

## 2018-01-04 LAB — TSH: TSH: 0.3 — AB (ref 0.41–5.90)

## 2018-01-14 ENCOUNTER — Non-Acute Institutional Stay (SKILLED_NURSING_FACILITY): Payer: Medicare Other

## 2018-01-14 DIAGNOSIS — Z0000 Encounter for general adult medical examination without abnormal findings: Secondary | ICD-10-CM | POA: Diagnosis not present

## 2018-01-14 NOTE — Patient Instructions (Signed)
[name] , Thank you for taking time to come for your Medicare Wellness Visit. I appreciate your ongoing commitment to your health goals. Please review the following plan we discussed and let me know if I can assist you in the future.   Screening recommendations/referrals: Colonoscopy excluded, over age 81 Mammogram excluded, over age 81 Bone Density excluded Recommended yearly ophthalmology/optometry visit for glaucoma screening and checkup Recommended yearly dental visit for hygiene and checkup  Vaccinations: Influenza vaccine up to date, due 2019 fall season Pneumococcal vaccine up to date, completed Tdap vaccine due, ordered Shingles vaccine not in past records    Advanced directives: Need a copy of living will and HCPOA for chart  Conditions/risks identified: none  Next appointment: Dr. Alwyn RenHopper makes rounds   Preventive Care 65 Years and Older, Female Preventive care refers to lifestyle choices and visits with your health care provider that can promote health and wellness. What does preventive care include?  A yearly physical exam. This is also called an annual well check.  Dental exams once or twice a year.  Routine eye exams. Ask your health care provider how often you should have your eyes checked.  Personal lifestyle choices, including:  Daily care of your teeth and gums.  Regular physical activity.  Eating a healthy diet.  Avoiding tobacco and drug use.  Limiting alcohol use.  Practicing safe sex.  Taking low-dose aspirin every day.  Taking vitamin and mineral supplements as recommended by your health care provider. What happens during an annual well check? The services and screenings done by your health care provider during your annual well check will depend on your age, overall health, lifestyle risk factors, and family history of disease. Counseling  Your health care provider may ask you questions about your:  Alcohol use.  Tobacco use.  Drug  use.  Emotional well-being.  Home and relationship well-being.  Sexual activity.  Eating habits.  History of falls.  Memory and ability to understand (cognition).  Work and work Astronomerenvironment.  Reproductive health. Screening  You may have the following tests or measurements:  Height, weight, and BMI.  Blood pressure.  Lipid and cholesterol levels. These may be checked every 5 years, or more frequently if you are over 668 years old.  Skin check.  Lung cancer screening. You may have this screening every year starting at age 81 if you have a 30-pack-year history of smoking and currently smoke or have quit within the past 15 years.  Fecal occult blood test (FOBT) of the stool. You may have this test every year starting at age 81.  Flexible sigmoidoscopy or colonoscopy. You may have a sigmoidoscopy every 5 years or a colonoscopy every 10 years starting at age 81.  Hepatitis C blood test.  Hepatitis B blood test.  Sexually transmitted disease (STD) testing.  Diabetes screening. This is done by checking your blood sugar (glucose) after you have not eaten for a while (fasting). You may have this done every 1-3 years.  Bone density scan. This is done to screen for osteoporosis. You may have this done starting at age 81.  Mammogram. This may be done every 1-2 years. Talk to your health care provider about how often you should have regular mammograms. Talk with your health care provider about your test results, treatment options, and if necessary, the need for more tests. Vaccines  Your health care provider may recommend certain vaccines, such as:  Influenza vaccine. This is recommended every year.  Tetanus, diphtheria, and  acellular pertussis (Tdap, Td) vaccine. You may need a Td booster every 10 years.  Zoster vaccine. You may need this after age 58.  Pneumococcal 13-valent conjugate (PCV13) vaccine. One dose is recommended after age 59.  Pneumococcal polysaccharide  (PPSV23) vaccine. One dose is recommended after age 13. Talk to your health care provider about which screenings and vaccines you need and how often you need them. This information is not intended to replace advice given to you by your health care provider. Make sure you discuss any questions you have with your health care provider. Document Released: 07/01/2015 Document Revised: 02/22/2016 Document Reviewed: 04/05/2015 Elsevier Interactive Patient Education  2017 [place] Prevention in the Home Falls can cause injuries. They can happen to people of all ages. There are many things you can do to make your home safe and to help prevent falls. What can I do on the outside of my home?  Regularly fix the edges of walkways and driveways and fix any cracks.  Remove anything that might make you trip as you walk through a door, such as a raised step or threshold.  Trim any bushes or trees on the path to your home.  Use bright outdoor lighting.  Clear any walking paths of anything that might make someone trip, such as rocks or tools.  Regularly check to see if handrails are loose or broken. Make sure that both sides of any steps have handrails.  Any raised decks and porches should have guardrails on the edges.  Have any leaves, snow, or ice cleared regularly.  Use sand or salt on walking paths during winter.  Clean up any spills in your garage right away. This includes oil or grease spills. What can I do in the bathroom?  Use night lights.  Install grab bars by the toilet and in the tub and shower. Do not use towel bars as grab bars.  Use non-skid mats or decals in the tub or shower.  If you need to sit down in the shower, use a plastic, non-slip stool.  Keep the floor dry. Clean up any water that spills on the floor as soon as it happens.  Remove soap buildup in the tub or shower regularly.  Attach bath mats securely with double-sided non-slip rug tape.  Do not have  throw rugs and other things on the floor that can make you trip. What can I do in the bedroom?  Use night lights.  Make sure that you have a light by your bed that is easy to reach.  Do not use any sheets or blankets that are too big for your bed. They should not hang down onto the floor.  Have a firm chair that has side arms. You can use this for support while you get dressed.  Do not have throw rugs and other things on the floor that can make you trip. What can I do in the kitchen?  Clean up any spills right away.  Avoid walking on wet floors.  Keep items that you use a lot in easy-to-reach places.  If you need to reach something above you, use a strong step stool that has a grab bar.  Keep electrical cords out of the way.  Do not use floor polish or wax that makes floors slippery. If you must use wax, use non-skid floor wax.  Do not have throw rugs and other things on the floor that can make you trip. What can I do with my stairs?  Do not leave any items on the stairs.  Make sure that there are handrails on both sides of the stairs and use them. Fix handrails that are broken or loose. Make sure that handrails are as long as the stairways.  Check any carpeting to make sure that it is firmly attached to the stairs. Fix any carpet that is loose or worn.  Avoid having throw rugs at the top or bottom of the stairs. If you do have throw rugs, attach them to the floor with carpet tape.  Make sure that you have a light switch at the top of the stairs and the bottom of the stairs. If you do not have them, ask someone to add them for you. What else can I do to help prevent falls?  Wear shoes that:  Do not have high heels.  Have rubber bottoms.  Are comfortable and fit you well.  Are closed at the toe. Do not wear sandals.  If you use a stepladder:  Make sure that it is fully opened. Do not climb a closed stepladder.  Make sure that both sides of the stepladder are  locked into place.  Ask someone to hold it for you, if possible.  Clearly mark and make sure that you can see:  Any grab bars or handrails.  First and last steps.  Where the edge of each step is.  Use tools that help you move around (mobility aids) if they are needed. These include:  Canes.  Walkers.  Scooters.  Crutches.  Turn on the lights when you go into a dark area. Replace any light bulbs as soon as they burn out.  Set up your furniture so you have a clear path. Avoid moving your furniture around.  If any of your floors are uneven, fix them.  If there are any pets around you, be aware of where they are.  Review your medicines with your doctor. Some medicines can make you feel dizzy. This can increase your chance of falling. Ask your doctor what other things that you can do to help prevent falls. This information is not intended to replace advice given to you by your health care provider. Make sure you discuss any questions you have with your health care provider. Document Released: 03/31/2009 Document Revised: 11/10/2015 Document Reviewed: 07/09/2014 Elsevier Interactive Patient Education  2017 [employer].

## 2018-01-14 NOTE — Progress Notes (Signed)
Subjective:   Kelly [name] is a 83 y.o. female who presents for Medicare Annual (Subsequent) preventive examination at [facility]; incapacitated patient unable to answer questions appropriately   Last AWV-01/10/2017    Objective:     Vitals: BP 124/60 (BP Location: Right Arm, Patient Position: Supine)   Pulse 60   Temp 97.8 F (36.6 C) (Oral)   Ht 5\' 9"  (1.753 m)   Wt 210 lb (95.3 kg)   SpO2 91%   BMI 31.01 kg/m   Body mass index is 31.01 kg/m.  Advanced Directives 01/14/2018 01/17/2017 01/10/2017 10/31/2016 10/03/2016 09/10/2016 08/30/2016  Does Patient Have a Medical Advance Directive? Yes Yes No No No No Yes  Type of Advance Directive Out of facility DNR (pink MOST or yellow form) (No Data) - - - - (No Data)  Does patient want to make changes to medical advance directive? No - Patient declined No - Patient declined - - - - No - Patient declined  Copy of Healthcare Power of Attorney in Chart? - - - - - - -  Would patient like information on creating a medical advance directive? - No - Patient declined No - Patient declined - - - -  Pre-existing out of facility DNR order (yellow form or pink MOST form) Pink MOST form placed in chart (order not valid for inpatient use) - - - - - -    Tobacco Social History   Tobacco Use  Smoking Status Former Smoker  Smokeless Tobacco Never Used  Tobacco Comment   Pt has not smoked while at [facility] per staff.      Counseling given: Not Answered Comment: Pt has not smoked while at SNF per staff.    Clinical Intake:  Pre-visit preparation completed: No  Pain : No/denies pain     Nutritional Risks: None Diabetes: Yes CBG done?: No Did pt. bring in CBG monitor from home?: No  How often do you need to have someone help you when you read instructions, pamphlets, or other written materials from your doctor or pharmacy?: 4 - Often  Interpreter Needed?: No  Information entered by :: Tyron Russell, Rn  Past Medical History:    Diagnosis Date  . Chronic kidney disease    Stage III  . Dementia with behavioral disturbance 03/19/2015   Notated on FL2 Form from Dr. Dreama Saa [phone]  . Depression    previously saw Dr Donell Beers  . Diabetes mellitus without complication (HCC)    11/18/15 A1c 6%  . Dyslipidemia    11/18/15 Tg 74, HDL 60, LDL 90 on statin  . HLD (hyperlipidemia)   . Hypertension   . Hypothyroidism    11/18/15 TSH 53.6, 02/07/16 TSH 9.42 @ [facility]  . MDD (major depressive disorder)    With behavioral disturbance  . Scabies    02/21/16 [facility],[place]; Rx: Permethrin cream  . Vitamin D deficiency    11/18/15 vitamin D 25-hydroxy 39   Past Surgical History:  Procedure Laterality Date  . no record     04/10/15 [facility] note: see old chart   Family History  Family history unknown: Yes   Social History   Socioeconomic History  . Marital status: Widowed    Spouse name: Not on file  . Number of children: Not on file  . Years of education: Not on file  . Highest education level: Not on file  Occupational History  . Not on file  Social Needs  . Financial resource strain:  Not on file  . Food insecurity:    Worry: Not on file    Inability: Not on file  . Transportation needs:    Medical: Not on file    Non-medical: Not on file  Tobacco Use  . Smoking status: Former Games developermoker  . Smokeless tobacco: Never Used  . Tobacco comment: Pt has not smoked while at SNF per staff.   Substance and Sexual Activity  . Alcohol use: No  . Drug use: No  . Sexual activity: Never  Lifestyle  . Physical activity:    Days per week: Not on file    Minutes per session: Not on file  . Stress: Not on file  Relationships  . Social connections:    Talks on phone: Not on file    Gets together: Not on file    Attends religious service: Not on file    Active member of club or organization: Not on file    Attends meetings of clubs or organizations: Not on file    Relationship status: Not on file   Other Topics Concern  . Not on file  Social History Narrative  . Not on file    Outpatient Encounter Medications as of 01/14/2018  Medication Sig  . acetaminophen (TYLENOL) 650 MG CR tablet Take 650 mg by mouth every 6 (six) hours as needed for pain.  . ARIPiprazole (ABILIFY) 5 MG tablet Take 5 mg by mouth daily.  Marland Kitchen. aspirin EC 81 MG tablet Take 81 mg by mouth daily.   Marland Kitchen. atorvastatin (LIPITOR) 40 MG tablet Take 40 mg by mouth daily.   . Calcium Carb-Cholecalciferol (CALCIUM-VITAMIN D3) 600-400 MG-UNIT TABS Take 1 tablet by mouth 2 (two) times daily.  . Cholecalciferol (VITAMIN D) 2000 units tablet Take 2,000 Units by mouth daily.   . divalproex (DEPAKOTE SPRINKLE) 125 MG capsule 375 mg 2 (two) times daily. Take three capsules by mouth to = 375 mg twice daily  . donepezil (ARICEPT) 10 MG tablet Take 10 mg by mouth at bedtime.  . furosemide (LASIX) 20 MG tablet Take 20 mg by mouth daily.   Marland Kitchen. levothyroxine (SYNTHROID, LEVOTHROID) 175 MCG tablet Take 175 mcg by mouth daily before breakfast.  . memantine (NAMENDA) 10 MG tablet Take 10 mg by mouth 2 (two) times daily.   . Multiple Vitamin (MULTI-VITAMIN DAILY PO) Take one tablet by mouth once daily   . PARoxetine (PAXIL) 10 MG tablet Take 15 mg by mouth at bedtime. Take 1-1/2 tablets to = 15 mg qd  . sennosides-docusate sodium (SENOKOT-S) 8.6-50 MG tablet Take 2 tablets by mouth daily.   . Skin Protectants, Misc. (MINERIN) CREA Apply topically to ankles twice a day for dry skin.   No facility-administered encounter medications on file as of 01/14/2018.     Activities of Daily Living In your present state of health, do you have any difficulty performing the following activities: 01/14/2018  Hearing? Y  Vision? N  Difficulty concentrating or making decisions? Y  Walking or climbing stairs? Y  Dressing or bathing? Y  Doing errands, shopping? Y  Preparing Food and eating ? Y  Using the Toilet? Y  In the past six months, have you accidently  leaked urine? Y  Do you have problems with loss of bowel control? Y  Managing your Medications? Y  Managing your Finances? Y  Housekeeping or managing your Housekeeping? Y  Some recent data might be hidden    Patient Care Team: Pecola LawlessHopper, William F, MD as PCP -  General (Internal Medicine)    Assessment:   This is a routine wellness examination for Shaquana.  Exercise Activities and Dietary recommendations Current Exercise Habits: The patient does not participate in regular exercise at present, Exercise limited by: neurologic condition(s);orthopedic condition(s)  Goals    None      Fall Risk Fall Risk  01/14/2018 01/10/2017 08/07/2016 04/20/2016  Falls in the past year? No No Yes Yes  Number falls in past yr: - - 2 or more 2 or more  Injury with Fall? - - - Yes  Risk Factor Category  - - - High Fall Risk   Is the patient's home free of loose throw rugs in walkways, pet beds, electrical cords, etc?   yes      Grab bars in the bathroom? yes      Handrails on the stairs?   yes      Adequate lighting?   yes   Depression Screen PHQ 2/9 Scores 01/14/2018 01/10/2017  PHQ - 2 Score - 6  PHQ- 9 Score - 14  Exception Documentation Medical reason -     Cognitive Function     6CIT Screen 01/14/2018 01/10/2017  What Year? 4 points 4 points  What month? 3 points 3 points  What time? 0 points 3 points  Count back from 20 4 points 4 points  Months in reverse 4 points 4 points  Repeat phrase 10 points 10 points  Total Score 25 28    Immunization History  Administered Date(s) Administered  . Influenza-Unspecified 02/17/2016, 04/15/2017  . PPD Test 04/08/2016  . Pneumococcal Conjugate-13 01/11/2017  . Pneumococcal-Unspecified 02/16/2014    Qualifies for Shingles Vaccine? Not in past records  Screening Tests Health Maintenance  Topic Date Due  . OPHTHALMOLOGY EXAM  03/20/2018 (Originally 10/12/1944)  . TETANUS/TDAP  09/16/2018 (Originally 10/12/1953)  . INFLUENZA VACCINE  01/16/2018   . HEMOGLOBIN A1C  07/07/2018  . FOOT EXAM  08/24/2018  . PNA vac Low Risk Adult  Completed  . URINE MICROALBUMIN  Discontinued  . DEXA SCAN  Discontinued    Cancer Screenings: Lung: Low Dose CT Chest recommended if Age 9-80 years, 30 pack-year currently smoking OR have quit w/in 15years. Patient does not qualify. Breast:  Up to date on Mammogram? excluded  Up to date of Bone Density/Dexa? excluded Colorectal: excluded  Additional Screenings:  Hepatitis C Screening: declined Diabetic eye exam due: ordered TDAP due: ordered    Plan:    I have personally reviewed and addressed the Medicare Annual Wellness questionnaire and have noted the following in the patient's chart:  A. Medical and social history B. Use of alcohol, tobacco or illicit drugs  C. Current medications and supplements D. Functional ability and status E.  Nutritional status F.  Physical activity G. Advance directives H. List of other physicians I.  Hospitalizations, surgeries, and ER visits in previous 12 months J.  Vitals K. Screenings to include hearing, vision, cognitive, depression L. Referrals and appointments - none  In addition, I am unable to review and discuss with incapacitated patient certain preventive protocols, quality metrics, and best practice recommendations. A written personalized care plan for preventive services as well as general preventive health recommendations were provided to patient.   See attached scanned questionnaire for additional information.   Signed,   Tyron Russell, RN Nurse Health Advisor  Patient Concerns: None

## 2018-01-28 DIAGNOSIS — F339 Major depressive disorder, recurrent, unspecified: Secondary | ICD-10-CM | POA: Diagnosis not present

## 2018-01-28 DIAGNOSIS — F39 Unspecified mood [affective] disorder: Secondary | ICD-10-CM | POA: Diagnosis not present

## 2018-01-28 DIAGNOSIS — G309 Alzheimer's disease, unspecified: Secondary | ICD-10-CM | POA: Diagnosis not present

## 2018-01-28 DIAGNOSIS — G4700 Insomnia, unspecified: Secondary | ICD-10-CM | POA: Diagnosis not present

## 2018-02-04 DIAGNOSIS — B351 Tinea unguium: Secondary | ICD-10-CM | POA: Diagnosis not present

## 2018-02-04 DIAGNOSIS — I739 Peripheral vascular disease, unspecified: Secondary | ICD-10-CM | POA: Diagnosis not present

## 2018-02-04 LAB — HM DIABETES FOOT EXAM

## 2018-02-14 DIAGNOSIS — G309 Alzheimer's disease, unspecified: Secondary | ICD-10-CM | POA: Diagnosis not present

## 2018-02-14 DIAGNOSIS — F39 Unspecified mood [affective] disorder: Secondary | ICD-10-CM | POA: Diagnosis not present

## 2018-02-14 DIAGNOSIS — F339 Major depressive disorder, recurrent, unspecified: Secondary | ICD-10-CM | POA: Diagnosis not present

## 2018-02-14 DIAGNOSIS — F0280 Dementia in other diseases classified elsewhere without behavioral disturbance: Secondary | ICD-10-CM | POA: Diagnosis not present

## 2018-02-15 DIAGNOSIS — R569 Unspecified convulsions: Secondary | ICD-10-CM | POA: Diagnosis not present

## 2018-02-15 DIAGNOSIS — Z79899 Other long term (current) drug therapy: Secondary | ICD-10-CM | POA: Diagnosis not present

## 2018-02-15 DIAGNOSIS — D649 Anemia, unspecified: Secondary | ICD-10-CM | POA: Diagnosis not present

## 2018-02-15 LAB — CBC AND DIFFERENTIAL
HCT: 36 (ref 36–46)
Hemoglobin: 12 (ref 12.0–16.0)
NEUTROS ABS: 1
PLATELETS: 111 — AB (ref 150–399)
WBC: 4.1

## 2018-02-15 LAB — HEPATIC FUNCTION PANEL: Bilirubin, Direct: 0.2 (ref 0.01–0.4)

## 2018-02-18 DIAGNOSIS — Z79899 Other long term (current) drug therapy: Secondary | ICD-10-CM | POA: Diagnosis not present

## 2018-02-18 DIAGNOSIS — R569 Unspecified convulsions: Secondary | ICD-10-CM | POA: Diagnosis not present

## 2018-02-18 DIAGNOSIS — D649 Anemia, unspecified: Secondary | ICD-10-CM | POA: Diagnosis not present

## 2018-02-18 LAB — BASIC METABOLIC PANEL
BUN: 13 (ref 4–21)
CREATININE: 0.8 (ref 0.5–1.1)
Glucose: 94
Potassium: 4.1 (ref 3.4–5.3)
SODIUM: 146 (ref 137–147)

## 2018-02-18 LAB — HEPATIC FUNCTION PANEL
ALT: 11 (ref 7–35)
AST: 21 (ref 13–35)
Alkaline Phosphatase: 80 (ref 25–125)
Bilirubin, Direct: 0.2 (ref 0.01–0.4)
Bilirubin, Total: 0.2

## 2018-02-18 LAB — CBC AND DIFFERENTIAL
HCT: 40 (ref 36–46)
HEMOGLOBIN: 13.5 (ref 12.0–16.0)
Neutrophils Absolute: 1
Platelets: 121 — AB (ref 150–399)
WBC: 3.7

## 2018-02-18 NOTE — Progress Notes (Signed)
Valproic acid level 65, which was the same as 01/04/18 value.

## 2018-02-20 ENCOUNTER — Encounter: Payer: Self-pay | Admitting: Internal Medicine

## 2018-02-20 ENCOUNTER — Non-Acute Institutional Stay (SKILLED_NURSING_FACILITY): Payer: Medicare Other | Admitting: Internal Medicine

## 2018-02-20 DIAGNOSIS — D696 Thrombocytopenia, unspecified: Secondary | ICD-10-CM

## 2018-02-20 DIAGNOSIS — E1129 Type 2 diabetes mellitus with other diabetic kidney complication: Secondary | ICD-10-CM

## 2018-02-20 DIAGNOSIS — F39 Unspecified mood [affective] disorder: Secondary | ICD-10-CM | POA: Diagnosis not present

## 2018-02-20 DIAGNOSIS — E039 Hypothyroidism, unspecified: Secondary | ICD-10-CM

## 2018-02-20 DIAGNOSIS — G309 Alzheimer's disease, unspecified: Secondary | ICD-10-CM | POA: Diagnosis not present

## 2018-02-20 NOTE — Assessment & Plan Note (Signed)
01/04/18 TSH 0.30 on 175 mcg of L-thyroxine Change L-thyroxine 150 mcg and recheck TSH in 8-12 weeks

## 2018-02-20 NOTE — Assessment & Plan Note (Signed)
Monitor for any bleeding dyscrasias 

## 2018-02-20 NOTE — Assessment & Plan Note (Signed)
02/20/18 glucoses are normal and A1c prediabetic at 5.9% on Abilify

## 2018-02-20 NOTE — Progress Notes (Signed)
    NURSING HOME LOCATION:  [facility] ROOM NUMBER:  214-B  CODE STATUS:  Full Code  PCP:  Pecola Lawless, MD  [address] [place] [address]  This is a nursing facility follow up of chronic medical diagnoses.   Interim medical record and care since last [facility] visit was updated with review of diagnostic studies and change in clinical status since last visit were documented.  HPI: She is a permanent resident of the SNF with medical diagnoses of essential hypertension, hypothyroidism, dyslipidemia, dementia with behavioral disturbances, and diabetes complicated by nephropathy. Psychiatry NP is actively following the patient.  The most recent visit was 02/14/2018.  Diagnoses include Alzheimer's dementia, mood disorder with psychoses, and insomnia.  Namenda was continued for the Alzheimer's dementia.Paxil was continued for depression. Depakote DR 375 mg twice daily was discontinued due to a Depakote level of 124 & low platelet count of 111,000.  Depakote DR 250 twice daily was initiated with follow-up labs of CMP, CBC with differential, and LFT on 9/3.  Platelet count was 121,000 ; other labs were normal.  Her TSH was 0.3 on a dose of 175 mcg of L thyroxine 01/04/2018.  TSH was therapeutic at 3.35 on 03/02/2017. She is on Abilify, glucoses have been normal and A1c prediabetic at 5.9% on 01/04/2018.    Review of systems: Dementia invalidated responses.  She is unable to give me the year.  Denies any active symptoms.  States that she had "balance over" had "tears from my eyes". When I asked were she were depressed, her answer was "maybe".  Physical exam:  Pertinent or positive findings: She sits in the wheelchair with minimal spontaneous motor activity.  She has bilateral ptosis.  Her facies suggest someone who is perplexed or worried.  S2 is increased.  She has 1/2+ edema of the ankles.  The pedal pulses were palpable.  Commands followed poorly and strength was difficult to  assess; but she could move all 4 extremities.  General appearance: Adequately nourished; no acute distress, increased work of breathing is present.   Lymphatic: No lymphadenopathy about the head, neck, axilla. Eyes: No conjunctival inflammation or lid edema is present. There is no scleral icterus. Ears:  External ear exam shows no significant lesions or deformities.   Nose:  External nasal examination shows no deformity or inflammation. Nasal mucosa are pink and moist without lesions, exudates Oral exam:  Lips and gums are healthy appearing. There is no oropharyngeal erythema or exudate. Neck:  No thyromegaly, masses, tenderness noted.    Heart:  Normal rate and regular rhythm. S1 normal without gallop, murmur, click, rub .  Lungs: Chest clear to auscultation without wheezes, rhonchi, rales, rubs. Abdomen: Bowel sounds are normal. Abdomen is soft and nontender with no organomegaly, hernias, masses. GU: Deferred  Extremities:  No cyanosis, clubbing Neurologic exam : Balance, Rhomberg, finger to nose testing could not be completed due to clinical state Skin: Warm & dry w/o tenting. No significant lesions or rash.  See summary under each active problem in the Problem List with associated updated therapeutic plan

## 2018-02-21 ENCOUNTER — Encounter: Payer: Self-pay | Admitting: Internal Medicine

## 2018-02-21 NOTE — Patient Instructions (Signed)
See assessment and plan under each diagnosis in the problem list and acutely for this visit 

## 2018-02-25 DIAGNOSIS — F339 Major depressive disorder, recurrent, unspecified: Secondary | ICD-10-CM | POA: Diagnosis not present

## 2018-02-25 DIAGNOSIS — F39 Unspecified mood [affective] disorder: Secondary | ICD-10-CM | POA: Diagnosis not present

## 2018-02-25 DIAGNOSIS — F0280 Dementia in other diseases classified elsewhere without behavioral disturbance: Secondary | ICD-10-CM | POA: Diagnosis not present

## 2018-02-25 DIAGNOSIS — G309 Alzheimer's disease, unspecified: Secondary | ICD-10-CM | POA: Diagnosis not present

## 2018-03-04 ENCOUNTER — Encounter: Payer: Self-pay | Admitting: Internal Medicine

## 2018-03-04 DIAGNOSIS — Z79899 Other long term (current) drug therapy: Secondary | ICD-10-CM | POA: Diagnosis not present

## 2018-03-04 DIAGNOSIS — R569 Unspecified convulsions: Secondary | ICD-10-CM | POA: Diagnosis not present

## 2018-03-04 DIAGNOSIS — D649 Anemia, unspecified: Secondary | ICD-10-CM | POA: Diagnosis not present

## 2018-03-04 DIAGNOSIS — D72819 Decreased white blood cell count, unspecified: Secondary | ICD-10-CM | POA: Insufficient documentation

## 2018-03-04 LAB — CBC AND DIFFERENTIAL
HCT: 37 (ref 36–46)
Hemoglobin: 12.3 (ref 12.0–16.0)
NEUTROS ABS: 1
PLATELETS: 135 — AB (ref 150–399)
WBC: 4

## 2018-03-04 LAB — HEPATIC FUNCTION PANEL
ALK PHOS: 80 (ref 25–125)
ALT: 15 (ref 7–35)
AST: 31 (ref 13–35)
BILIRUBIN, TOTAL: 0.2

## 2018-03-04 LAB — BASIC METABOLIC PANEL
BUN: 19 (ref 4–21)
Creatinine: 0.8 (ref 0.5–1.1)
Glucose: 103
Potassium: 4.2 (ref 3.4–5.3)
Sodium: 146 (ref 137–147)

## 2018-03-11 DIAGNOSIS — G309 Alzheimer's disease, unspecified: Secondary | ICD-10-CM | POA: Diagnosis not present

## 2018-03-11 DIAGNOSIS — F0280 Dementia in other diseases classified elsewhere without behavioral disturbance: Secondary | ICD-10-CM | POA: Diagnosis not present

## 2018-03-11 DIAGNOSIS — F39 Unspecified mood [affective] disorder: Secondary | ICD-10-CM | POA: Diagnosis not present

## 2018-03-11 DIAGNOSIS — F339 Major depressive disorder, recurrent, unspecified: Secondary | ICD-10-CM | POA: Diagnosis not present

## 2018-03-25 DIAGNOSIS — I129 Hypertensive chronic kidney disease with stage 1 through stage 4 chronic kidney disease, or unspecified chronic kidney disease: Secondary | ICD-10-CM | POA: Diagnosis not present

## 2018-03-25 DIAGNOSIS — R2689 Other abnormalities of gait and mobility: Secondary | ICD-10-CM | POA: Diagnosis not present

## 2018-03-25 DIAGNOSIS — R262 Difficulty in walking, not elsewhere classified: Secondary | ICD-10-CM | POA: Diagnosis not present

## 2018-03-26 DIAGNOSIS — R2689 Other abnormalities of gait and mobility: Secondary | ICD-10-CM | POA: Diagnosis not present

## 2018-03-26 DIAGNOSIS — I129 Hypertensive chronic kidney disease with stage 1 through stage 4 chronic kidney disease, or unspecified chronic kidney disease: Secondary | ICD-10-CM | POA: Diagnosis not present

## 2018-03-26 DIAGNOSIS — R262 Difficulty in walking, not elsewhere classified: Secondary | ICD-10-CM | POA: Diagnosis not present

## 2018-03-27 DIAGNOSIS — I129 Hypertensive chronic kidney disease with stage 1 through stage 4 chronic kidney disease, or unspecified chronic kidney disease: Secondary | ICD-10-CM | POA: Diagnosis not present

## 2018-03-27 DIAGNOSIS — R2689 Other abnormalities of gait and mobility: Secondary | ICD-10-CM | POA: Diagnosis not present

## 2018-03-27 DIAGNOSIS — R262 Difficulty in walking, not elsewhere classified: Secondary | ICD-10-CM | POA: Diagnosis not present

## 2018-03-28 DIAGNOSIS — R2689 Other abnormalities of gait and mobility: Secondary | ICD-10-CM | POA: Diagnosis not present

## 2018-03-28 DIAGNOSIS — I129 Hypertensive chronic kidney disease with stage 1 through stage 4 chronic kidney disease, or unspecified chronic kidney disease: Secondary | ICD-10-CM | POA: Diagnosis not present

## 2018-03-28 DIAGNOSIS — R262 Difficulty in walking, not elsewhere classified: Secondary | ICD-10-CM | POA: Diagnosis not present

## 2018-03-29 DIAGNOSIS — R262 Difficulty in walking, not elsewhere classified: Secondary | ICD-10-CM | POA: Diagnosis not present

## 2018-03-29 DIAGNOSIS — I129 Hypertensive chronic kidney disease with stage 1 through stage 4 chronic kidney disease, or unspecified chronic kidney disease: Secondary | ICD-10-CM | POA: Diagnosis not present

## 2018-03-29 DIAGNOSIS — R2689 Other abnormalities of gait and mobility: Secondary | ICD-10-CM | POA: Diagnosis not present

## 2018-04-01 DIAGNOSIS — I129 Hypertensive chronic kidney disease with stage 1 through stage 4 chronic kidney disease, or unspecified chronic kidney disease: Secondary | ICD-10-CM | POA: Diagnosis not present

## 2018-04-01 DIAGNOSIS — R262 Difficulty in walking, not elsewhere classified: Secondary | ICD-10-CM | POA: Diagnosis not present

## 2018-04-01 DIAGNOSIS — R2689 Other abnormalities of gait and mobility: Secondary | ICD-10-CM | POA: Diagnosis not present

## 2018-04-02 DIAGNOSIS — R2689 Other abnormalities of gait and mobility: Secondary | ICD-10-CM | POA: Diagnosis not present

## 2018-04-02 DIAGNOSIS — I129 Hypertensive chronic kidney disease with stage 1 through stage 4 chronic kidney disease, or unspecified chronic kidney disease: Secondary | ICD-10-CM | POA: Diagnosis not present

## 2018-04-02 DIAGNOSIS — R262 Difficulty in walking, not elsewhere classified: Secondary | ICD-10-CM | POA: Diagnosis not present

## 2018-04-03 DIAGNOSIS — R2689 Other abnormalities of gait and mobility: Secondary | ICD-10-CM | POA: Diagnosis not present

## 2018-04-03 DIAGNOSIS — I129 Hypertensive chronic kidney disease with stage 1 through stage 4 chronic kidney disease, or unspecified chronic kidney disease: Secondary | ICD-10-CM | POA: Diagnosis not present

## 2018-04-03 DIAGNOSIS — R262 Difficulty in walking, not elsewhere classified: Secondary | ICD-10-CM | POA: Diagnosis not present

## 2018-04-04 ENCOUNTER — Encounter: Payer: Self-pay | Admitting: Adult Health

## 2018-04-04 ENCOUNTER — Non-Acute Institutional Stay (SKILLED_NURSING_FACILITY): Payer: Medicare Other | Admitting: Adult Health

## 2018-04-04 DIAGNOSIS — E782 Mixed hyperlipidemia: Secondary | ICD-10-CM | POA: Diagnosis not present

## 2018-04-04 DIAGNOSIS — R600 Localized edema: Secondary | ICD-10-CM | POA: Diagnosis not present

## 2018-04-04 DIAGNOSIS — E039 Hypothyroidism, unspecified: Secondary | ICD-10-CM | POA: Diagnosis not present

## 2018-04-04 DIAGNOSIS — F39 Unspecified mood [affective] disorder: Secondary | ICD-10-CM

## 2018-04-04 DIAGNOSIS — F32A Depression, unspecified: Secondary | ICD-10-CM

## 2018-04-04 DIAGNOSIS — F329 Major depressive disorder, single episode, unspecified: Secondary | ICD-10-CM

## 2018-04-04 NOTE — Progress Notes (Signed)
Location:  [facility] Room Number: 214-B Place of Service:  SNF (31) Provider:  Kenard Gower, NP  Patient Care Team: Pecola Lawless, MD as PCP - General (Internal Medicine)  Extended Emergency Contact Information Primary Emergency Contact: Robinson,Roberto Address: [address]          [place], [place] [address] [place] Home Phone: [phone] Relation: Son Secondary Emergency Contact: Reid,Cynthia  [place] Mobile Phone: [phone] Relation: Daughter  Code Status:  Full Code  Goals of care: Advanced Directive information Advanced Directives 01/14/2018  Does Patient Have a Medical Advance Directive? Yes  Type of Advance Directive Out of facility DNR (pink MOST or yellow form)  Does patient want to make changes to medical advance directive? No - Patient declined  Copy of Healthcare Power of Attorney in Chart? -  Would patient like information on creating a medical advance directive? -  Pre-existing out of facility DNR order (yellow form or pink MOST form) Pink MOST form placed in chart (order not valid for inpatient use)     Chief Complaint  Patient presents with  . Medical Management of Chronic Issues    Routine [facility] visit    HPI:  Pt is an 83 y.o. female seen today for medical management of chronic diseases.  She is a long-term care resident of [facility].  She has a PMH of dementia, hypothyroidism, and hypertension. She was reported to have increased agitation/mood swings. She was reported to have spit on the CNAs face and becomes combative during ADL care. Resident admits getting upset and spiting on the staff.   Past Medical History:  Diagnosis Date  . Chronic kidney disease    Stage III  . Dementia with behavioral disturbance (HCC) 03/19/2015   Notated on FL2 Form from Dr. Dreama Saa [phone]  . Depression    previously saw Dr Donell Beers  . Diabetes mellitus  without complication (HCC)    11/18/15 A1c 6%  . Dyslipidemia    11/18/15 Tg 74, HDL 60, LDL 90 on statin  . HLD (hyperlipidemia)   . Hypertension   . Hypothyroidism    11/18/15 TSH 53.6, 02/07/16 TSH 9.42 @ [facility]  . MDD (major depressive disorder)    With behavioral disturbance  . Scabies    02/21/16 [facility],[place]; Rx: Permethrin cream  . Vitamin D deficiency    11/18/15 vitamin D 25-hydroxy 39   Past Surgical History:  Procedure Laterality Date  . no record     04/10/15 [facility] note: see old chart    No Known Allergies  Outpatient Encounter Medications as of 04/04/2018  Medication Sig  . acetaminophen (TYLENOL) 650 MG CR tablet Take 650 mg by mouth every 6 (six) hours as needed for pain.  . ARIPiprazole (ABILIFY) 5 MG tablet Take 5 mg by mouth daily.  Marland Kitchen aspirin EC 81 MG tablet Take 81 mg by mouth daily.   Marland Kitchen atorvastatin (LIPITOR) 40 MG tablet Take 40 mg by mouth daily.   . Calcium Carb-Cholecalciferol (CALCIUM-VITAMIN D3) 600-400 MG-UNIT TABS Take 1 tablet by mouth 2 (two) times daily.  . Cholecalciferol (VITAMIN D) 2000 units tablet Take 2,000 Units by mouth daily.   . divalproex (DEPAKOTE SPRINKLE) 125 MG capsule 250 mg daily.   Marland Kitchen donepezil (ARICEPT) 10 MG tablet Take 10 mg by mouth at bedtime.  . furosemide (LASIX) 20 MG tablet Take 20 mg by mouth daily.   Marland Kitchen levothyroxine (SYNTHROID, LEVOTHROID) 150 MCG  tablet Take 150 mcg by mouth daily before breakfast.  . memantine (NAMENDA) 10 MG tablet Take 10 mg by mouth 2 (two) times daily.   . Multiple Vitamin (MULTI-VITAMIN DAILY PO) Take one tablet by mouth once daily   . PARoxetine (PAXIL) 10 MG tablet Take 15 mg by mouth at bedtime. Take 1-1/2 tablets to = 15 mg qd  . sennosides-docusate sodium (SENOKOT-S) 8.6-50 MG tablet Take 2 tablets by mouth daily.   . Skin Protectants, Misc. (MINERIN) CREA Apply topically to ankles twice a day for dry skin.  . [DISCONTINUED] levothyroxine (SYNTHROID, LEVOTHROID) 175 MCG  tablet Take 175 mcg by mouth daily before breakfast.   No facility-administered encounter medications on file as of 04/04/2018.     Review of Systems  GENERAL: No change in appetite, no fatigue, no weight changes, no fever, chills or weakness MOUTH and THROAT: Denies oral discomfort, gingival pain or bleeding RESPIRATORY: no cough, SOB, DOE, wheezing, hemoptysis CARDIAC: No chest pain, or palpitations GI: No abdominal pain, diarrhea, constipation, heart burn, nausea or vomiting GU: Denies dysuria, frequency, hematuria, or discharge PSYCHIATRIC: +agitation  Immunization History  Administered Date(s) Administered  . Influenza-Unspecified 02/17/2016, 04/15/2017  . PPD Test 04/08/2016  . Pneumococcal Conjugate-13 01/11/2017  . Pneumococcal-Unspecified 02/16/2014   Pertinent  Health Maintenance Due  Topic Date Due  . OPHTHALMOLOGY EXAM  10/12/1944  . INFLUENZA VACCINE  01/16/2018  . HEMOGLOBIN A1C  07/07/2018  . FOOT EXAM  02/05/2019  . PNA vac Low Risk Adult  Completed  . URINE MICROALBUMIN  Discontinued  . DEXA SCAN  Discontinued   Fall Risk  01/14/2018 01/10/2017 08/07/2016 04/20/2016  Falls in the past year? No No Yes Yes  Number falls in past yr: - - 2 or more 2 or more  Injury with Fall? - - - Yes  Risk Factor Category  - - - High Fall Risk    Vitals:   04/04/18 1608  BP: 118/64  Pulse: 67  Resp: 18  Temp: 98 F (36.7 C)  TempSrc: Oral  Weight: 209 lb 12.8 oz (95.2 kg)  Height: 5\' 9"  (1.753 m)   Body mass index is 30.98 kg/m.  Physical Exam  GENERAL APPEARANCE: Well nourished. In no acute distress. Obese SKIN:  Skin is warm and dry.  MOUTH and THROAT: Lips are without lesions. Oral mucosa is moist and without lesions. Tongue is normal in shape, size, and color and without lesions RESPIRATORY: Breathing is even & unlabored, BS CTAB CARDIAC: RRR, no murmur,no extra heart sounds, BLE trace edema GI: Abdomen soft, normal BS, no masses, no tenderness  EXTREMITIES:   Able to move X 4 extremities NEUROLOGICAL: There is no tremor. Speech is clear. Alert to self, disoriented to time and place. PSYCHIATRIC:  Affect and behavior are appropriate   Labs reviewed: Recent Labs    01/04/18 02/18/18 03/04/18  NA 142 146 146  K 4.3 4.1 4.2  BUN 20 13 19   CREATININE 0.8 0.8 0.8   Recent Labs    01/04/18 02/18/18 03/04/18  AST 18 21 31   ALT 9 11 15   ALKPHOS 69 80 80   Recent Labs    02/15/18 02/18/18 03/04/18  WBC 4.1 3.7 4.0  NEUTROABS 1 1 1   HGB 12.0 13.5 12.3  HCT 36 40 37  PLT 111* 121* 135*   Lab Results  Component Value Date   TSH 0.30 (A) 01/04/2018   Lab Results  Component Value Date   HGBA1C 5.9 01/04/2018   Lab  Results  Component Value Date   CHOL 143 10/04/2016   HDL 48 10/04/2016   LDLCALC 80 10/04/2016   TRIG 77 10/04/2016     Assessment/Plan  1. Mood disorder with psychosis (HCC) -has periods of agitation, will increase Depakote DR 250 mg daily to twice daily, continue aripiprazole 5 mg daily, followed up by team health psych NP   2. Lower extremity edema -stable, continue Lasix 20 mg 1 tab daily   3. Hypothyroidism, unspecified type -continue levothyroxine 150 mcg 1 tab daily, for repeat tsh Lab Results  Component Value Date   TSH 0.30 (A) 01/04/2018     4. Mixed hyperlipidemia -continue Lipitor 40 mg 1 tab nightly Lab Results  Component Value Date   CHOL 143 10/04/2016   HDL 48 10/04/2016   LDLCALC 80 10/04/2016   TRIG 77 10/04/2016    5. Chronic depression -continue paroxetine 10 mg 1 1/2 tab = 15 mg daily    Family/ staff Communication: Discussed plan of care with charge nurse and resident.   Labs/tests ordered:  None  Goals of care:   Long-term care.   Kenard Gower, NP [facility] [phone] (Monday-Friday 8:00 a.m. - 5:00 p.m.) [phone] (after hours)

## 2018-04-05 DIAGNOSIS — I129 Hypertensive chronic kidney disease with stage 1 through stage 4 chronic kidney disease, or unspecified chronic kidney disease: Secondary | ICD-10-CM | POA: Diagnosis not present

## 2018-04-05 DIAGNOSIS — R2689 Other abnormalities of gait and mobility: Secondary | ICD-10-CM | POA: Diagnosis not present

## 2018-04-05 DIAGNOSIS — R262 Difficulty in walking, not elsewhere classified: Secondary | ICD-10-CM | POA: Diagnosis not present

## 2018-04-07 DIAGNOSIS — R2689 Other abnormalities of gait and mobility: Secondary | ICD-10-CM | POA: Diagnosis not present

## 2018-04-07 DIAGNOSIS — R262 Difficulty in walking, not elsewhere classified: Secondary | ICD-10-CM | POA: Diagnosis not present

## 2018-04-07 DIAGNOSIS — I129 Hypertensive chronic kidney disease with stage 1 through stage 4 chronic kidney disease, or unspecified chronic kidney disease: Secondary | ICD-10-CM | POA: Diagnosis not present

## 2018-04-11 DIAGNOSIS — G309 Alzheimer's disease, unspecified: Secondary | ICD-10-CM | POA: Diagnosis not present

## 2018-04-11 DIAGNOSIS — F0280 Dementia in other diseases classified elsewhere without behavioral disturbance: Secondary | ICD-10-CM | POA: Diagnosis not present

## 2018-04-11 DIAGNOSIS — F339 Major depressive disorder, recurrent, unspecified: Secondary | ICD-10-CM | POA: Diagnosis not present

## 2018-04-11 DIAGNOSIS — F39 Unspecified mood [affective] disorder: Secondary | ICD-10-CM | POA: Diagnosis not present

## 2018-04-17 DIAGNOSIS — E039 Hypothyroidism, unspecified: Secondary | ICD-10-CM | POA: Diagnosis not present

## 2018-04-17 DIAGNOSIS — R946 Abnormal results of thyroid function studies: Secondary | ICD-10-CM | POA: Diagnosis not present

## 2018-04-17 LAB — TSH: TSH: 0.12 — AB (ref 0.41–5.90)

## 2018-04-21 DIAGNOSIS — E119 Type 2 diabetes mellitus without complications: Secondary | ICD-10-CM | POA: Diagnosis not present

## 2018-04-21 DIAGNOSIS — H43813 Vitreous degeneration, bilateral: Secondary | ICD-10-CM | POA: Diagnosis not present

## 2018-04-21 DIAGNOSIS — H25813 Combined forms of age-related cataract, bilateral: Secondary | ICD-10-CM | POA: Diagnosis not present

## 2018-04-21 DIAGNOSIS — H04123 Dry eye syndrome of bilateral lacrimal glands: Secondary | ICD-10-CM | POA: Diagnosis not present

## 2018-04-29 ENCOUNTER — Non-Acute Institutional Stay (SKILLED_NURSING_FACILITY): Payer: Medicare Other | Admitting: Adult Health

## 2018-04-29 ENCOUNTER — Encounter: Payer: Self-pay | Admitting: Adult Health

## 2018-04-29 DIAGNOSIS — E782 Mixed hyperlipidemia: Secondary | ICD-10-CM | POA: Diagnosis not present

## 2018-04-29 DIAGNOSIS — F339 Major depressive disorder, recurrent, unspecified: Secondary | ICD-10-CM | POA: Diagnosis not present

## 2018-04-29 DIAGNOSIS — E559 Vitamin D deficiency, unspecified: Secondary | ICD-10-CM | POA: Diagnosis not present

## 2018-04-29 DIAGNOSIS — F0391 Unspecified dementia with behavioral disturbance: Secondary | ICD-10-CM

## 2018-04-29 DIAGNOSIS — R600 Localized edema: Secondary | ICD-10-CM | POA: Diagnosis not present

## 2018-04-29 DIAGNOSIS — E039 Hypothyroidism, unspecified: Secondary | ICD-10-CM | POA: Diagnosis not present

## 2018-04-29 DIAGNOSIS — F39 Unspecified mood [affective] disorder: Secondary | ICD-10-CM

## 2018-04-29 NOTE — Progress Notes (Signed)
Location:  [facility] Room Number: 214-B Place of Service:  SNF (31) Provider:  Kenard Gower, NP  Patient Care Team: Pecola Lawless, MD as PCP - General (Internal Medicine)  Extended Emergency Contact Information Primary Emergency Contact: Robinson,Roberto Address: [address]          [place], [place] [address] [place] Home Phone: [phone] Relation: Son Secondary Emergency Contact: Reid,Cynthia  [place] Mobile Phone: [phone] Relation: Daughter  Code Status:  Full Code  Goals of care: Advanced Directive information Advanced Directives 01/14/2018  Does Patient Have a Medical Advance Directive? Yes  Type of Advance Directive Out of facility DNR (pink MOST or yellow form)  Does patient want to make changes to medical advance directive? No - Patient declined  Copy of Healthcare Power of Attorney in Chart? -  Would patient like information on creating a medical advance directive? -  Pre-existing out of facility DNR order (yellow form or pink MOST form) Pink MOST form placed in chart (order not valid for inpatient use)     Chief Complaint  Patient presents with  . Medical Management of Chronic Issues    Routine [facility] visit    HPI:  Pt is an 83 y.o. female seen today for medical management of chronic diseases.  She is a long-term care resident of [facility].  She has a PMH of dementia, hypothyroidism, and hypertension. Dose reduction for Depakote failed. She had started getting agitated and refused care, spits on staff, etc. She was seen in her room today. Tsh taken on 10/31 was 0.12, low. Levothyroxine was recently decreased from 150 mcg to 125 mcg daily.   Past Medical History:  Diagnosis Date  . Chronic kidney disease    Stage III  . Dementia with behavioral disturbance (HCC) 03/19/2015   Notated on FL2 Form from Dr. Dreama Saa [phone]  . Depression    previously saw Dr Donell Beers  . Diabetes mellitus without complication (HCC)    11/18/15 A1c 6%  . Dyslipidemia    11/18/15 Tg 74, HDL 60, LDL 90 on statin  . HLD (hyperlipidemia)   . Hypertension   . Hypothyroidism    11/18/15 TSH 53.6, 02/07/16 TSH 9.42 @ [facility]  . MDD (major depressive disorder)    With behavioral disturbance  . Scabies    02/21/16 [facility],[place]; Rx: Permethrin cream  . Vitamin D deficiency    11/18/15 vitamin D 25-hydroxy 39   Past Surgical History:  Procedure Laterality Date  . no record     04/10/15 [facility] note: see old chart    No Known Allergies  Outpatient Encounter Medications as of 04/29/2018  Medication Sig  . acetaminophen (TYLENOL) 650 MG CR tablet Take 650 mg by mouth every 6 (six) hours as needed for pain.  . ARIPiprazole (ABILIFY) 5 MG tablet Take 5 mg by mouth daily.  Marland Kitchen aspirin EC 81 MG tablet Take 81 mg by mouth daily.   Marland Kitchen atorvastatin (LIPITOR) 40 MG tablet Take 40 mg by mouth daily.   . Calcium Carb-Cholecalciferol (CALCIUM-VITAMIN D3) 600-400 MG-UNIT TABS Take 1 tablet by mouth 2 (two) times daily.  . Cholecalciferol (VITAMIN D) 2000 units tablet Take 2,000 Units by mouth daily.   . divalproex (DEPAKOTE) 250 MG DR tablet Take 250 mg by mouth 2 (two) times daily.  Marland Kitchen donepezil (ARICEPT) 10 MG tablet Take 10 mg by mouth at bedtime.  . furosemide (LASIX) 20 MG tablet Take  20 mg by mouth daily.   Marland Kitchen levothyroxine (SYNTHROID, LEVOTHROID) 150 MCG tablet Take 150 mcg by mouth daily before breakfast.  . memantine (NAMENDA) 10 MG tablet Take 10 mg by mouth 2 (two) times daily.   . Multiple Vitamin (MULTI-VITAMIN DAILY PO) Take one tablet by mouth once daily   . PARoxetine (PAXIL) 10 MG tablet Take 15 mg by mouth at bedtime. Take 1-1/2 tablets to = 15 mg qd  . sennosides-docusate sodium (SENOKOT-S) 8.6-50 MG tablet Take 2 tablets by mouth daily.   . Skin Protectants, Misc. (MINERIN) CREA Apply topically to ankles twice a day for dry skin.    . [DISCONTINUED] divalproex (DEPAKOTE SPRINKLE) 125 MG capsule 250 mg daily.    No facility-administered encounter medications on file as of 04/29/2018.     Review of Systems  GENERAL: No change in appetite, no fatigue, no weight changes, no fever, chills or weakness MOUTH and THROAT: Denies oral discomfort, gingival pain or bleeding RESPIRATORY: no cough, SOB, DOE, wheezing, hemoptysis CARDIAC: No chest pain, edema or palpitations GI: No abdominal pain, diarrhea, constipation, heart burn, nausea or vomiting PSYCHIATRIC: Denies feelings of depression or anxiety. No report of hallucinations, insomnia, paranoia, or agitation   Immunization History  Administered Date(s) Administered  . Influenza-Unspecified 02/17/2016, 04/15/2017  . PPD Test 04/08/2016  . Pneumococcal Conjugate-13 01/11/2017  . Pneumococcal-Unspecified 02/16/2014   Pertinent  Health Maintenance Due  Topic Date Due  . OPHTHALMOLOGY EXAM  10/12/1944  . INFLUENZA VACCINE  01/16/2018  . HEMOGLOBIN A1C  07/07/2018  . FOOT EXAM  02/05/2019  . PNA vac Low Risk Adult  Completed  . URINE MICROALBUMIN  Discontinued  . DEXA SCAN  Discontinued   Fall Risk  01/14/2018 01/10/2017 08/07/2016 04/20/2016  Falls in the past year? No No Yes Yes  Number falls in past yr: - - 2 or more 2 or more  Injury with Fall? - - - Yes  Risk Factor Category  - - - High Fall Risk      Vitals:   04/29/18 0912  BP: 118/64  Pulse: 67  Resp: 17  Temp: 98 F (36.7 C)  TempSrc: Oral  SpO2: 97%  Weight: 217 lb 6.4 oz (98.6 kg)  Height: 5\' 9"  (1.753 m)   Body mass index is 32.1 kg/m.  Physical Exam  GENERAL APPEARANCE: Well nourished. In no acute distress. Obese SKIN:  Skin is warm and dry.  MOUTH and THROAT: Lips are without lesions. Oral mucosa is moist and without lesions. Tongue is normal in shape, size, and color and without lesions RESPIRATORY: Breathing is even & unlabored, BS CTAB CARDIAC: RRR, no murmur,no extra heart sounds,  no edema GI: Abdomen soft, normal BS, no masses, no tenderness EXTREMITIES: Able to move X 4 extremities, has BLE generalized weakness NEUROLOGICAL: There is no tremor. Speech is clear. Alert to self, disoriented to time and place. PSYCHIATRIC: Affect and behavior are appropriate   Labs reviewed: Recent Labs    01/04/18 02/18/18 03/04/18  NA 142 146 146  K 4.3 4.1 4.2  BUN 20 13 19   CREATININE 0.8 0.8 0.8   Recent Labs    01/04/18 02/18/18 03/04/18  AST 18 21 31   ALT 9 11 15   ALKPHOS 69 80 80   Recent Labs    02/15/18 02/18/18 03/04/18  WBC 4.1 3.7 4.0  NEUTROABS 1 1 1   HGB 12.0 13.5 12.3  HCT 36 40 37  PLT 111* 121* 135*   Lab Results  Component Value  Date   TSH 0.12 (A) 04/17/2018   Lab Results  Component Value Date   HGBA1C 5.9 01/04/2018   Lab Results  Component Value Date   CHOL 143 10/04/2016   HDL 48 10/04/2016   LDLCALC 80 10/04/2016   TRIG 77 10/04/2016    Assessment/Plan  1. Hypothyroidism, unspecified type - had a recent dsage reduction on Levothyroxine from 150 mcg to 125 mcg daily, for repeat tsh on 06/26/18 Lab Results  Component Value Date   TSH 0.12 (A) 04/17/2018     2. Mood disorder with psychosis (HCC) - had a recent failed dose reduction, continue Depakote 250 mg BID and to continue aripiprazole 5 mg 1 tab daily, followed up by team health psych NP   3. Mixed hyperlipidemia  -continue Lipitor 40 mg 1 tab nightly   4. Bilateral lower extremity edema -stable, continue Lasix 20 mg 1 tab daily   5. Vitamin D deficiency -continue vitamin D3 1000 units 2 tabs = 2000 units daily   6. Depression, recurrent (HCC) -stable, continue paroxetine 10 mg give 1-1/2 tab = 15 mg daily   7. Dementia with behavioral disturbance -continue memantine 10 mg 1 tab twice a day and donepezil 10 mg 1 tab nightly    Family/ staff Communication:  Discussed plan of care with resident and charge nurse.  Labs/tests ordered:  None  Goals of care:    Long-term care.   Kenard Gower, NP [facility] [phone] (Monday-Friday 8:00 a.m. - 5:00 p.m.) [phone] (after hours)

## 2018-05-07 ENCOUNTER — Encounter: Payer: Self-pay | Admitting: Adult Health

## 2018-05-07 DIAGNOSIS — F329 Major depressive disorder, single episode, unspecified: Secondary | ICD-10-CM

## 2018-05-07 DIAGNOSIS — E782 Mixed hyperlipidemia: Secondary | ICD-10-CM

## 2018-05-07 DIAGNOSIS — E559 Vitamin D deficiency, unspecified: Secondary | ICD-10-CM

## 2018-05-07 DIAGNOSIS — F39 Unspecified mood [affective] disorder: Secondary | ICD-10-CM

## 2018-05-07 DIAGNOSIS — R600 Localized edema: Secondary | ICD-10-CM

## 2018-05-07 DIAGNOSIS — F0391 Unspecified dementia with behavioral disturbance: Secondary | ICD-10-CM

## 2018-05-07 DIAGNOSIS — F32A Depression, unspecified: Secondary | ICD-10-CM

## 2018-05-07 DIAGNOSIS — E039 Hypothyroidism, unspecified: Secondary | ICD-10-CM

## 2018-05-07 NOTE — Progress Notes (Deleted)
Location:  [facility] Room Number: 214-B Place of Service:  SNF (31) Provider:  Kenard Gower, NP  Patient Care Team: Pecola Lawless, MD as PCP - General (Internal Medicine)  Extended Emergency Contact Information Primary Emergency Contact: Robinson,Roberto Address: [address]          [place], [place] [address] [place] Home Phone: [phone] Relation: Son Secondary Emergency Contact: Reid,Cynthia  [place] Mobile Phone: [phone] Relation: Daughter  Code Status:  Full Code  Goals of care: Advanced Directive information Advanced Directives 01/14/2018  Does Patient Have a Medical Advance Directive? Yes  Type of Advance Directive Out of facility DNR (pink MOST or yellow form)  Does patient want to make changes to medical advance directive? No - Patient declined  Copy of Healthcare Power of Attorney in Chart? -  Would patient like information on creating a medical advance directive? -  Pre-existing out of facility DNR order (yellow form or pink MOST form) Pink MOST form placed in chart (order not valid for inpatient use)     Chief Complaint  Patient presents with  . Advanced Directive    Care plan meeting    HPI:  Pt is an 83 y.o. female seen today for a care plan meeting.  She is a long-term care resident of [facility].  She has a PMH of dementia, hypothyroidism, and hypertension. Sh      Past Medical History:  Diagnosis Date  . Chronic kidney disease    Stage III  . Dementia with behavioral disturbance (HCC) 03/19/2015   Notated on FL2 Form from Dr. Dreama Saa [phone]  . Depression    previously saw Dr Donell Beers  . Diabetes mellitus without complication (HCC)    11/18/15 A1c 6%  . Dyslipidemia    11/18/15 Tg 74, HDL 60, LDL 90 on statin  . HLD (hyperlipidemia)   . Hypertension   . Hypothyroidism    11/18/15 TSH 53.6, 02/07/16 TSH 9.42 @ [facility]  . MDD (major  depressive disorder)    With behavioral disturbance  . Scabies    02/21/16 [facility],[place]; Rx: Permethrin cream  . Vitamin D deficiency    11/18/15 vitamin D 25-hydroxy 39   Past Surgical History:  Procedure Laterality Date  . no record     04/10/15 [facility] note: see old chart    No Known Allergies  Outpatient Encounter Medications as of 05/07/2018  Medication Sig  . acetaminophen (TYLENOL) 650 MG CR tablet Take 650 mg by mouth every 6 (six) hours as needed for pain.  . ARIPiprazole (ABILIFY) 5 MG tablet Take 5 mg by mouth daily.  Marland Kitchen aspirin EC 81 MG tablet Take 81 mg by mouth daily.   Marland Kitchen atorvastatin (LIPITOR) 40 MG tablet Take 40 mg by mouth daily.   . Calcium Carb-Cholecalciferol (CALCIUM-VITAMIN D3) 600-400 MG-UNIT TABS Take 1 tablet by mouth 2 (two) times daily.  . Cholecalciferol (VITAMIN D) 2000 units tablet Take 2,000 Units by mouth daily.   . divalproex (DEPAKOTE) 250 MG DR tablet Take 250 mg by mouth 2 (two) times daily.  Marland Kitchen donepezil (ARICEPT) 10 MG tablet Take 10 mg by mouth at bedtime.  . furosemide (LASIX) 20 MG tablet Take 20 mg by mouth daily.   Marland Kitchen levothyroxine (SYNTHROID, LEVOTHROID) 125 MCG tablet Take 125 mcg by mouth daily before breakfast.  . memantine (NAMENDA) 10 MG tablet Take 10 mg by mouth 2 (two) times daily.   . Multiple  Vitamin (MULTI-VITAMIN DAILY PO) Take one tablet by mouth once daily   . PARoxetine (PAXIL) 10 MG tablet Take 15 mg by mouth at bedtime. Take 1-1/2 tablets to = 15 mg qd  . sennosides-docusate sodium (SENOKOT-S) 8.6-50 MG tablet Take 2 tablets by mouth daily.   . Skin Protectants, Misc. (MINERIN) CREA Apply topically to ankles twice a day for dry skin.  . [DISCONTINUED] levothyroxine (SYNTHROID, LEVOTHROID) 150 MCG tablet Take 150 mcg by mouth daily before breakfast.   No facility-administered encounter medications on file as of 05/07/2018.     Review of Systems  GENERAL: No change in appetite, no fatigue, no weight changes,  no fever, chills or weakness MOUTH and THROAT: Denies oral discomfort, gingival pain or bleeding, pain from teeth or hoarseness   RESPIRATORY: no cough, SOB, DOE, wheezing, hemoptysis CARDIAC: No chest pain, edema or palpitations GI: No abdominal pain, diarrhea, constipation, heart burn, nausea or vomiting GU: Denies dysuria, frequency, hematuria, incontinence, or discharge PSYCHIATRIC: Denies feelings of depression or anxiety. No report of hallucinations, insomnia, paranoia, or agitation   Immunization History  Administered Date(s) Administered  . Influenza-Unspecified 02/17/2016, 04/15/2017  . PPD Test 04/08/2016  . Pneumococcal Conjugate-13 01/11/2017  . Pneumococcal-Unspecified 02/16/2014   Pertinent  Health Maintenance Due  Topic Date Due  . INFLUENZA VACCINE  01/16/2018  . OPHTHALMOLOGY EXAM  05/08/2019 (Originally 10/12/1944)  . HEMOGLOBIN A1C  07/07/2018  . FOOT EXAM  02/05/2019  . PNA vac Low Risk Adult  Completed  . URINE MICROALBUMIN  Discontinued  . DEXA SCAN  Discontinued   Fall Risk  01/14/2018 01/10/2017 08/07/2016 04/20/2016  Falls in the past year? No No Yes Yes  Number falls in past yr: - - 2 or more 2 or more  Injury with Fall? - - - Yes  Risk Factor Category  - - - High Fall Risk     Vitals:   05/07/18 1101  BP: 118/64  Pulse: 67  Resp: 18  Temp: 98 F (36.7 C)  TempSrc: Oral  Weight: 217 lb 6.4 oz (98.6 kg)  Height: 5\' 9"  (1.753 m)   Body mass index is 32.1 kg/m.  Physical Exam  GENERAL APPEARANCE: Well nourished. In no acute distress. Obese SKIN:  Skin is warm and dry.  MOUTH and THROAT: Lips are without lesions. Oral mucosa is moist and without lesions. Tongue is normal in shape, size, and color and without lesions RESPIRATORY: Breathing is even & unlabored, BS CTAB CARDIAC: RRR, no murmur,no extra heart sounds, no edema GI: Abdomen soft, normal BS, no masses, no tenderness EXTREMITIES:  Able to move X 4 extremities, BLE generalized  weakness NEUROLOGICAL: There is no tremor. Speech is clear. Alert to self, disoriented to time and place. PSYCHIATRIC:  Affect and behavior are appropriate  Labs reviewed: Recent Labs    01/04/18 02/18/18 03/04/18  NA 142 146 146  K 4.3 4.1 4.2  BUN 20 13 19   CREATININE 0.8 0.8 0.8   Recent Labs    01/04/18 02/18/18 03/04/18  AST 18 21 31   ALT 9 11 15   ALKPHOS 69 80 80   Recent Labs    02/15/18 02/18/18 03/04/18  WBC 4.1 3.7 4.0  NEUTROABS 1 1 1   HGB 12.0 13.5 12.3  HCT 36 40 37  PLT 111* 121* 135*   Lab Results  Component Value Date   TSH 0.12 (A) 04/17/2018   Lab Results  Component Value Date   HGBA1C 5.9 01/04/2018   Lab Results  Component  Value Date   CHOL 143 10/04/2016   HDL 48 10/04/2016   LDLCALC 80 10/04/2016   TRIG 77 10/04/2016    Assessment/Plan  1. Hypothyroidism, unspecified type -  Continue Levothyroxine 125 mcg 1 tab daily Lab Results  Component Value Date   TSH 0.12 (A) 04/17/2018    2. Mixed hyperlipidemia - continue Lipitor 40 mg 1 tab Q HS Lab Results  Component Value Date   CHOL 143 10/04/2016   HDL 48 10/04/2016   LDLCALC 80 10/04/2016   TRIG 77 10/04/2016     3. Vitamin D deficiency - continue Vitamin D3 1,000 units 2 tabs = 2,000 units daily   4. Bilateral lower extremity edema -  Continue Lasix 20 mg 1 tab daily   5. Mood disorder with psychosis (HCC) - mood is stable, continue Divalproex 250 mg 1 tab BID and Aripiprazole 5 mg 1 tab daily   6. Chronic depression - stable, continue Paroxetine 10 mg 1 1/2 tab = 15 mg daily    7. Dementia with behavioral disturbance, unspecified dementia type (HCC) - stable, continue Memantine 10 mg 1 tab BID, Donepezil 10 mg 1 tab Q HS,     Family/ staff Communication: Discussed plan of care.  Labs/tests ordered:  None  Goals of care:   Long-term care.   Kelly GowerMonina Medina-Vargas, NP [facility] [phone] (Monday-Friday 8:00 a.m. - 5:00  p.m.) [phone] (after hours)

## 2018-05-07 NOTE — Progress Notes (Signed)
This encounter was created in error - please disregard.

## 2018-05-09 DIAGNOSIS — G308 Other Alzheimer's disease: Secondary | ICD-10-CM | POA: Diagnosis not present

## 2018-05-09 DIAGNOSIS — F0281 Dementia in other diseases classified elsewhere with behavioral disturbance: Secondary | ICD-10-CM | POA: Diagnosis not present

## 2018-05-09 DIAGNOSIS — G4700 Insomnia, unspecified: Secondary | ICD-10-CM | POA: Diagnosis not present

## 2018-05-09 DIAGNOSIS — F39 Unspecified mood [affective] disorder: Secondary | ICD-10-CM | POA: Diagnosis not present

## 2018-05-30 ENCOUNTER — Encounter: Payer: Self-pay | Admitting: Adult Health

## 2018-05-30 ENCOUNTER — Non-Acute Institutional Stay (SKILLED_NURSING_FACILITY): Payer: Medicare Other | Admitting: Adult Health

## 2018-05-30 DIAGNOSIS — E038 Other specified hypothyroidism: Secondary | ICD-10-CM | POA: Diagnosis not present

## 2018-05-30 DIAGNOSIS — F0281 Dementia in other diseases classified elsewhere with behavioral disturbance: Secondary | ICD-10-CM | POA: Diagnosis not present

## 2018-05-30 DIAGNOSIS — F39 Unspecified mood [affective] disorder: Secondary | ICD-10-CM | POA: Diagnosis not present

## 2018-05-30 DIAGNOSIS — E782 Mixed hyperlipidemia: Secondary | ICD-10-CM | POA: Diagnosis not present

## 2018-05-30 DIAGNOSIS — F02818 Dementia in other diseases classified elsewhere, unspecified severity, with other behavioral disturbance: Secondary | ICD-10-CM

## 2018-05-30 DIAGNOSIS — G308 Other Alzheimer's disease: Secondary | ICD-10-CM

## 2018-05-30 DIAGNOSIS — R600 Localized edema: Secondary | ICD-10-CM | POA: Diagnosis not present

## 2018-05-30 NOTE — Progress Notes (Signed)
Location:  [facility] Room Number: 214-B Place of Service:  SNF (31) Provider:  Kenard GowerMedina-Vargas, , NP  Patient Care Team: Pecola LawlessHopper, William F, MD as PCP - General (Internal Medicine)  Extended Emergency Contact Information Primary Emergency Contact: [name],Kelly Address: [address]          [place], [place] [address] [place] Home Phone: [phone] Relation: Son Secondary Emergency Contact: [name],Kelly  [place] Mobile Phone: [phone] Relation: Daughter  Code Status:  Full Code  Goals of care: Advanced Directive information Advanced Directives 01/14/2018  Does Patient Have a Medical Advance Directive? Yes  Type of Advance Directive Out of facility DNR (pink MOST or yellow form)  Does patient want to make changes to medical advance directive? No - Patient declined  Copy of Healthcare Power of Attorney in Chart? -  Would patient like information on creating a medical advance directive? -  Pre-existing out of facility DNR order (yellow form or pink MOST form) Pink MOST form placed in chart (order not valid for inpatient use)     Chief Complaint  Patient presents with  . Medical Management of Chronic Issues    Routine [facility] visit    HPI:  Pt is an 83 y.o. female seen today for medical management of chronic diseases.  She is a long-term care resident of [name].  She has a PMH of dementia, hypothyroidism, and hypertension. She was seen in her room today. She denies any concerns.    Past Medical History:  Diagnosis Date  . Chronic kidney disease    Stage III  . Dementia with behavioral disturbance (HCC) 03/19/2015   Notated on FL2 Form from Dr. Dreama SaaJoel Brass [phone]  . Depression    previously saw Dr Donell BeersPlovsky  . Diabetes mellitus without complication (HCC)    11/18/15 A1c 6%  . Dyslipidemia    11/18/15 Tg 74, HDL 60, LDL 90 on statin  . HLD (hyperlipidemia)   .  Hypertension   . Hypothyroidism    11/18/15 TSH 53.6, 02/07/16 TSH 9.42 @ [facility]  . MDD (major depressive disorder)    With behavioral disturbance  . Scabies    02/21/16 [facility],[place]; Rx: Permethrin cream  . Vitamin D deficiency    11/18/15 vitamin D 25-hydroxy 39   Past Surgical History:  Procedure Laterality Date  . no record     04/10/15 [facility] note: see old chart    No Known Allergies  Outpatient Encounter Medications as of 05/30/2018  Medication Sig  . acetaminophen (TYLENOL) 650 MG CR tablet Take 650 mg by mouth every 6 (six) hours as needed for pain.  . ARIPiprazole (ABILIFY) 5 MG tablet Take 5 mg by mouth daily.  Marland Kitchen. aspirin EC 81 MG tablet Take 81 mg by mouth daily.   Marland Kitchen. atorvastatin (LIPITOR) 40 MG tablet Take 40 mg by mouth daily.   . Calcium Carb-Cholecalciferol (CALCIUM-VITAMIN D3) 600-400 MG-UNIT TABS Take 1 tablet by mouth 2 (two) times daily.  . Cholecalciferol (VITAMIN D) 2000 units tablet Take 2,000 Units by mouth daily.   . divalproex (DEPAKOTE) 250 MG DR tablet Take 250 mg by mouth 2 (two) times daily.  Marland Kitchen. donepezil (ARICEPT) 10 MG tablet Take 10 mg by mouth at bedtime.  . furosemide (LASIX) 20 MG tablet Take 20 mg by mouth daily.   Marland Kitchen. levothyroxine (SYNTHROID, LEVOTHROID) 125 MCG tablet Take 125 mcg by mouth daily before breakfast.  . memantine (NAMENDA) 10 MG tablet  Take 10 mg by mouth 2 (two) times daily.   . Multiple Vitamin (MULTI-VITAMIN DAILY PO) Take one tablet by mouth once daily   . PARoxetine (PAXIL) 10 MG tablet Take 15 mg by mouth at bedtime. Take 1-1/2 tablets to = 15 mg qd  . sennosides-docusate sodium (SENOKOT-S) 8.6-50 MG tablet Take 2 tablets by mouth daily.   . Skin Protectants, Misc. (MINERIN) CREA Apply topically to ankles twice a day for dry skin.   No facility-administered encounter medications on file as of 05/30/2018.     Review of Systems  GENERAL: No change in appetite, no fatigue, no weight changes, no fever, chills  or weakness MOUTH and THROAT: Denies oral discomfort RESPIRATORY: no cough, SOB, DOE, wheezing, hemoptysis CARDIAC: No chest pain, or palpitations GI: No abdominal pain, diarrhea, constipation, heart burn, nausea or vomiting GU: Denies dysuria, frequency, hematuria, or discharge PSYCHIATRIC: Denies feelings of depression or anxiety. No report of hallucinations, insomnia, paranoia, or agitation    Immunization History  Administered Date(s) Administered  . Influenza-Unspecified 02/17/2016, 04/15/2017  . PPD Test 04/08/2016  . Pneumococcal Conjugate-13 01/11/2017  . Pneumococcal-Unspecified 02/16/2014   Pertinent  Health Maintenance Due  Topic Date Due  . INFLUENZA VACCINE  01/16/2018  . OPHTHALMOLOGY EXAM  05/08/2019 (Originally 10/12/1944)  . HEMOGLOBIN A1C  07/07/2018  . FOOT EXAM  02/05/2019  . PNA vac Low Risk Adult  Completed  . URINE MICROALBUMIN  Discontinued  . DEXA SCAN  Discontinued   Fall Risk  01/14/2018 01/10/2017 08/07/2016 04/20/2016  Falls in the past year? No No Yes Yes  Number falls in past yr: - - 2 or more 2 or more  Injury with Fall? - - - Yes  Risk Factor Category  - - - High Fall Risk    Vitals:   05/30/18 1114  Weight: 209 lb 9.6 oz (95.1 kg)  Height: 5\' 9"  (1.753 m)   Body mass index is 30.95 kg/m.  Physical Exam  GENERAL APPEARANCE: Well nourished. In no acute distress.Obese SKIN:  Skin is warm and dry.  MOUTH and THROAT: Lips are without lesions. Oral mucosa is moist and without lesions.  RESPIRATORY: Breathing is even & unlabored, BS CTAB CARDIAC: RRR, no murmur,no extra heart sounds, BLE 1+ edema GI: Abdomen soft, normal BS, no masses EXTREMITIES:  Able to move X 4 extremities NEUROLOGICAL: There is no tremor. Speech is clear. Alert to self, disoriented to time and place PSYCHIATRIC:  Affect and behavior are appropriate  Labs reviewed: Recent Labs    01/04/18 02/18/18 03/04/18  NA 142 146 146  K 4.3 4.1 4.2  BUN 20 13 19   CREATININE  0.8 0.8 0.8   Recent Labs    01/04/18 02/18/18 03/04/18  AST 18 21 31   ALT 9 11 15   ALKPHOS 69 80 80   Recent Labs    02/15/18 02/18/18 03/04/18  WBC 4.1 3.7 4.0  NEUTROABS 1 1 1   HGB 12.0 13.5 12.3  HCT 36 40 37  PLT 111* 121* 135*   Lab Results  Component Value Date   TSH 0.12 (A) 04/17/2018   Lab Results  Component Value Date   HGBA1C 5.9 01/04/2018   Lab Results  Component Value Date   CHOL 143 10/04/2016   HDL 48 10/04/2016   LDLCALC 80 10/04/2016   TRIG 77 10/04/2016   Assessment/Plan  1. Mixed hyperlipidemia -continue Lipitor 40 mg 1 tab nightly   2. Other specified hypothyroidism -continue levothyroxine 125 mcg 1 tab daily Lab  Results  Component Value Date   TSH 0.12 (A) 04/17/2018    3. Mood disorder with psychosis (HCC) -mood is a stable, continue aripiprazole 5 mg 1 tab daily and Depakote 250 mg 1 tab twice a day   4. Lower extremity edema - has 1+ BLE edema, will continue Lasix 20 mg 1 tab daily   5. Alzheimer's disease of other onset with behavioral disturbance (HCC) -continue memantine 10 mg 1 tab twice a day, donepezil 10 mg 1 tab nightly, supportive care and fall precautions     Family/ staff Communication: Discussed plan of care with resident and charge nurse.  Labs/tests ordered:  None  Goals of care:   Long-term care.   Kenard Gower, NP [facility] and Adult Medicine [phone] (Monday-Friday 8:00 a.m. - 5:00 p.m.) [phone] (after hours)

## 2018-06-09 DIAGNOSIS — B351 Tinea unguium: Secondary | ICD-10-CM | POA: Diagnosis not present

## 2018-06-09 DIAGNOSIS — E1151 Type 2 diabetes mellitus with diabetic peripheral angiopathy without gangrene: Secondary | ICD-10-CM | POA: Diagnosis not present

## 2018-06-09 LAB — HM DIABETES FOOT EXAM

## 2018-06-20 DIAGNOSIS — D649 Anemia, unspecified: Secondary | ICD-10-CM | POA: Diagnosis not present

## 2018-06-20 DIAGNOSIS — Z79899 Other long term (current) drug therapy: Secondary | ICD-10-CM | POA: Diagnosis not present

## 2018-06-20 DIAGNOSIS — N390 Urinary tract infection, site not specified: Secondary | ICD-10-CM | POA: Diagnosis not present

## 2018-06-20 DIAGNOSIS — R319 Hematuria, unspecified: Secondary | ICD-10-CM | POA: Diagnosis not present

## 2018-06-20 LAB — CBC AND DIFFERENTIAL
HCT: 37 (ref 36–46)
Hemoglobin: 12.5 (ref 12.0–16.0)
Neutrophils Absolute: 1
Platelets: 126 — AB (ref 150–399)
WBC: 3.9

## 2018-06-26 LAB — TSH: TSH: 11.43 — AB (ref 0.41–5.90)

## 2018-06-27 ENCOUNTER — Encounter: Payer: Self-pay | Admitting: Adult Health

## 2018-06-27 ENCOUNTER — Non-Acute Institutional Stay (SKILLED_NURSING_FACILITY): Payer: Medicare Other | Admitting: Adult Health

## 2018-06-27 DIAGNOSIS — M79674 Pain in right toe(s): Secondary | ICD-10-CM | POA: Diagnosis not present

## 2018-06-27 DIAGNOSIS — R600 Localized edema: Secondary | ICD-10-CM | POA: Diagnosis not present

## 2018-06-27 DIAGNOSIS — E038 Other specified hypothyroidism: Secondary | ICD-10-CM

## 2018-06-27 NOTE — Progress Notes (Signed)
Location:  [facility] Room Number: 214-B Place of Service:  SNF (31) Provider:  Kenard GowerMedina-Vargas, Monina, NP  Patient Care Team: Pecola LawlessHopper, William F, MD as PCP - General (Internal Medicine)  Extended Emergency Contact Information Primary Emergency Contact: Robinson,Roberto Address: [address]          [place], [place] [address] [place] Home Phone: [phone] Relation: Son Secondary Emergency Contact: Reid,Cynthia  [place] Mobile Phone: [phone] Relation: Daughter  Code Status:  Full Code  Goals of care: Advanced Directive information Advanced Directives 01/14/2018  Does Patient Have a Medical Advance Directive? Yes  Type of Advance Directive Out of facility DNR (pink MOST or yellow form)  Does patient want to make changes to medical advance directive? No - Patient declined  Copy of Healthcare Power of Attorney in Chart? -  Would patient like information on creating a medical advance directive? -  Pre-existing out of facility DNR order (yellow form or pink MOST form) Pink MOST form placed in chart (order not valid for inpatient use)     Chief Complaint  Patient presents with  . Acute Visit    Patient has hyperthyroidism (TSH 11.43).    HPI:  Pt is a 84 y.o. female seen today for an acute visit secondary to a TSH of 11.43. She was seen in her room today. She is alert to herself and disoriented to time and place. She denies having constipation. She complained of pain on her right big toe which was noted to have trace edema. No erythema. No open wound noted. She is a long-term care resident of [facility].  She has a PMH of dementia, hypothyroidism, and hypertension.    Past Medical History:  Diagnosis Date  . Chronic kidney disease    Stage III  . Dementia with behavioral disturbance (HCC) 03/19/2015   Notated on FL2 Form from Dr. Dreama SaaJoel Brass [phone]  . Depression    previously saw Dr  Donell BeersPlovsky  . Diabetes mellitus without complication (HCC)    11/18/15 A1c 6%  . Dyslipidemia    11/18/15 Tg 74, HDL 60, LDL 90 on statin  . HLD (hyperlipidemia)   . Hypertension   . Hypothyroidism    11/18/15 TSH 53.6, 02/07/16 TSH 9.42 @ [facility]  . MDD (major depressive disorder)    With behavioral disturbance  . Scabies    02/21/16 [facility],[place]; Rx: Permethrin cream  . Vitamin D deficiency    11/18/15 vitamin D 25-hydroxy 39   Past Surgical History:  Procedure Laterality Date  . no record     04/10/15 [facility] note: see old chart    No Known Allergies  Outpatient Encounter Medications as of 06/27/2018  Medication Sig  . acetaminophen (TYLENOL) 325 MG tablet Take 650 mg by mouth every 6 (six) hours as needed for mild pain or moderate pain.  . ARIPiprazole (ABILIFY) 5 MG tablet Take 5 mg by mouth daily.  Marland Kitchen. aspirin EC 81 MG tablet Take 81 mg by mouth daily.   Marland Kitchen. atorvastatin (LIPITOR) 40 MG tablet Take 40 mg by mouth daily.   . Calcium Carb-Cholecalciferol (CALCIUM-VITAMIN D3) 600-400 MG-UNIT TABS Take 1 tablet by mouth 2 (two) times daily.  . Cholecalciferol (VITAMIN D) 2000 units tablet Take 2,000 Units by mouth daily.   . divalproex (DEPAKOTE) 250 MG DR tablet Take 250 mg by mouth 2 (two) times daily.  Marland Kitchen. donepezil (ARICEPT) 10 MG tablet Take 10 mg by mouth at bedtime.  .Marland Kitchen  furosemide (LASIX) 20 MG tablet Take 20 mg by mouth daily.   Marland Kitchen levothyroxine (SYNTHROID, LEVOTHROID) 125 MCG tablet Take 125 mcg by mouth daily before breakfast.  . memantine (NAMENDA) 10 MG tablet Take 10 mg by mouth 2 (two) times daily.   . Multiple Vitamin (MULTI-VITAMIN DAILY PO) Take one tablet by mouth once daily   . PARoxetine (PAXIL) 10 MG tablet Take 15 mg by mouth at bedtime. Take 1-1/2 tablets to = 15 mg qd  . sennosides-docusate sodium (SENOKOT-S) 8.6-50 MG tablet Take 2 tablets by mouth daily.   . Skin Protectants, Misc. (MINERIN) CREA Apply topically to ankles twice a day for dry skin.    . [DISCONTINUED] acetaminophen (TYLENOL) 650 MG CR tablet Take 650 mg by mouth every 6 (six) hours as needed for pain.   No facility-administered encounter medications on file as of 06/27/2018.     Review of Systems  GENERAL: No change in appetite, no fatigue, no weight changes, no fever, chills or weakness MOUTH and THROAT: Denies oral discomfort, gingival pain or bleeding RESPIRATORY: no cough, SOB, DOE, wheezing, hemoptysis CARDIAC: No chest pain, edema or palpitations GI: No abdominal pain, diarrhea, constipation, heart burn, nausea or vomiting GU: Denies dysuria, frequency, hematuria, or discharge MUSCULOSKELETAL: +pain on her right big toe PSYCHIATRIC: Denies feelings of depression or anxiety. No report of hallucinations, insomnia, paranoia, or agitation   Immunization History  Administered Date(s) Administered  . Influenza-Unspecified 02/17/2016, 04/15/2017  . PPD Test 04/08/2016  . Pneumococcal Conjugate-13 01/11/2017  . Pneumococcal-Unspecified 02/16/2014   Pertinent  Health Maintenance Due  Topic Date Due  . INFLUENZA VACCINE  01/16/2018  . OPHTHALMOLOGY EXAM  05/08/2019 (Originally 10/12/1944)  . HEMOGLOBIN A1C  07/07/2018  . FOOT EXAM  06/10/2019  . PNA vac Low Risk Adult  Completed  . URINE MICROALBUMIN  Discontinued  . DEXA SCAN  Discontinued   Fall Risk  01/14/2018 01/10/2017 08/07/2016 04/20/2016  Falls in the past year? No No Yes Yes  Number falls in past yr: - - 2 or more 2 or more  Injury with Fall? - - - Yes  Risk Factor Category  - - - High Fall Risk      Vitals:   06/27/18 1046  Weight: 209 lb 3.2 oz (94.9 kg)  Height: 5\' 9"  (1.753 m)   Body mass index is 30.89 kg/m.  Physical Exam  GENERAL APPEARANCE: Well nourished. Obese. SKIN:  Skin is warm and dry.  MOUTH and THROAT: Lips are without lesions. Oral mucosa is moist and without lesions.  RESPIRATORY: Breathing is even & unlabored, BS CTAB CARDIAC: RRR, no murmur,no extra heart sounds, Right  big toe trace edema GI: Abdomen soft, normal BS, no masses, no tenderness EXTREMITIES:  Able to move X 4 extremities NEUROLOGICAL: There is no tremor. Speech is clear. Alert to self, disoriented to time and place. PSYCHIATRIC:  Affect and behavior are appropriate  Labs reviewed: Recent Labs    01/04/18 02/18/18 03/04/18  NA 142 146 146  K 4.3 4.1 4.2  BUN 20 13 19   CREATININE 0.8 0.8 0.8   Recent Labs    01/04/18 02/18/18 03/04/18  AST 18 21 31   ALT 9 11 15   ALKPHOS 69 80 80   Recent Labs    02/18/18 03/04/18 06/20/18  WBC 3.7 4.0 3.9  NEUTROABS 1 1 1   HGB 13.5 12.3 12.5  HCT 40 37 37  PLT 121* 135* 126*   Lab Results  Component Value Date   TSH  0.12 (A) 04/17/2018   Lab Results  Component Value Date   HGBA1C 5.9 01/04/2018   Lab Results  Component Value Date   CHOL 143 10/04/2016   HDL 48 10/04/2016   LDLCALC 80 10/04/2016   TRIG 77 10/04/2016    Assessment/Plan  1. Other specified hypothyroidism -  tsh 11.43, elevated, will increase Levothyroxine from 125 mcg to 137 mcg daily, repeat tsh in 6 weeks   2. Great toe pain, right - right big toe is tender with trace edema, will check for uric acid level to rule out gout   3. Bilateral lower extremity edema - stable, will continue Lasix 20 mg 1 tab daily, will check BMP    Family/ staff Communication: Discussed plan of care with resident.  Labs/tests ordered:  Uric acid and BMP on 06/30/17 and tsh in 6 weeks  Goals of care:   Long-term care.   Kenard Gower, NP [facility] [phone] (Monday-Friday 8:00 a.m. - 5:00 p.m.) [phone] (after hours)

## 2018-06-30 DIAGNOSIS — R600 Localized edema: Secondary | ICD-10-CM | POA: Diagnosis not present

## 2018-06-30 DIAGNOSIS — D649 Anemia, unspecified: Secondary | ICD-10-CM | POA: Diagnosis not present

## 2018-06-30 LAB — BASIC METABOLIC PANEL
BUN: 16 (ref 4–21)
Creatinine: 0.9 (ref 0.5–1.1)
GLUCOSE: 109
Potassium: 4.2 (ref 3.4–5.3)
Sodium: 144 (ref 137–147)

## 2018-07-02 DIAGNOSIS — F339 Major depressive disorder, recurrent, unspecified: Secondary | ICD-10-CM | POA: Diagnosis not present

## 2018-07-02 DIAGNOSIS — G308 Other Alzheimer's disease: Secondary | ICD-10-CM | POA: Diagnosis not present

## 2018-07-02 DIAGNOSIS — F0281 Dementia in other diseases classified elsewhere with behavioral disturbance: Secondary | ICD-10-CM | POA: Diagnosis not present

## 2018-07-02 DIAGNOSIS — F39 Unspecified mood [affective] disorder: Secondary | ICD-10-CM | POA: Diagnosis not present

## 2018-07-09 ENCOUNTER — Encounter: Payer: Self-pay | Admitting: Adult Health

## 2018-07-09 ENCOUNTER — Non-Acute Institutional Stay (SKILLED_NURSING_FACILITY): Payer: Medicare Other | Admitting: Adult Health

## 2018-07-09 DIAGNOSIS — Z7189 Other specified counseling: Secondary | ICD-10-CM | POA: Diagnosis not present

## 2018-07-09 DIAGNOSIS — E782 Mixed hyperlipidemia: Secondary | ICD-10-CM

## 2018-07-09 DIAGNOSIS — G308 Other Alzheimer's disease: Secondary | ICD-10-CM

## 2018-07-09 DIAGNOSIS — R600 Localized edema: Secondary | ICD-10-CM

## 2018-07-09 DIAGNOSIS — F39 Unspecified mood [affective] disorder: Secondary | ICD-10-CM

## 2018-07-09 DIAGNOSIS — F0281 Dementia in other diseases classified elsewhere with behavioral disturbance: Secondary | ICD-10-CM

## 2018-07-09 DIAGNOSIS — E038 Other specified hypothyroidism: Secondary | ICD-10-CM | POA: Diagnosis not present

## 2018-07-09 DIAGNOSIS — F339 Major depressive disorder, recurrent, unspecified: Secondary | ICD-10-CM | POA: Diagnosis not present

## 2018-07-09 NOTE — Progress Notes (Signed)
Location:  [facility] Room Number: 214-B Place of Service:  SNF (31) Provider:  Kenard GowerMedina-Vargas, Tameah Mihalko, NP  Patient Care Team: Pecola LawlessHopper, William F, MD as PCP - General (Internal Medicine)  Extended Emergency Contact Information Primary Emergency Contact: Robinson,Roberto Address: [address]          [place], [place] [address] [place] Home Phone: [phone] Relation: Son Secondary Emergency Contact: Reid,Cynthia  [place] Mobile Phone: [phone] Relation: Daughter  Code Status:  Full Code  Goals of care: Advanced Directive information Advanced Directives 01/14/2018  Does Patient Have a Medical Advance Directive? Yes  Type of Advance Directive Out of facility DNR (pink MOST or yellow form)  Does patient want to make changes to medical advance directive? No - Patient declined  Copy of Healthcare Power of Attorney in Chart? -  Would patient like information on creating a medical advance directive? -  Pre-existing out of facility DNR order (yellow form or pink MOST form) Pink MOST form placed in chart (order not valid for inpatient use)     Chief Complaint  Patient presents with  . Medical Management of Chronic Issues    Routine [facility] visit  . Advanced Directive    Care Plan Meeting    HPI:  Pt is a 84 y.o. female seen today for medical management of chronic diseases, as well as a care plan meeting.  She is a long-term care resident of [facility].  She has a PMH of dementia, hypothyroidism, and hypertension. Care plan meeting was attended by her 2 daughters via conference call. It was also attended by NP, Child psychotherapistsocial worker, dietician and activity personnel. Daughters requested for her to have group activities instead of independent activities. Whenever she is asked to to attend an activity, she refuses. But, when she is brought to the activity, she participates. Her weight ranges from 209 to 217  lbs. She attends the monthly wellness club and loves to eat chef salad. It was noted that resident is more cooperative with ADL care. The meeting lasted for 20 minutes.   Past Medical History:  Diagnosis Date  . Chronic kidney disease    Stage III  . Dementia with behavioral disturbance (HCC) 03/19/2015   Notated on FL2 Form from Dr. Dreama SaaJoel Brass [phone]  . Depression    previously saw Dr Donell BeersPlovsky  . Diabetes mellitus without complication (HCC)    11/18/15 A1c 6%  . Dyslipidemia    11/18/15 Tg 74, HDL 60, LDL 90 on statin  . HLD (hyperlipidemia)   . Hypertension   . Hypothyroidism    11/18/15 TSH 53.6, 02/07/16 TSH 9.42 @ [facility]  . MDD (major depressive disorder)    With behavioral disturbance  . Scabies    02/21/16 [facility],[place]; Rx: Permethrin cream  . Vitamin D deficiency    11/18/15 vitamin D 25-hydroxy 39   Past Surgical History:  Procedure Laterality Date  . no record     04/10/15 [facility] note: see old chart    No Known Allergies  Outpatient Encounter Medications as of 07/09/2018  Medication Sig  . acetaminophen (TYLENOL) 325 MG tablet Take 650 mg by mouth every 6 (six) hours as needed for mild pain or moderate pain.  . ARIPiprazole (ABILIFY) 5 MG tablet Take 5 mg by mouth daily.  Marland Kitchen. aspirin EC 81 MG tablet Take 81 mg by mouth daily.   Marland Kitchen. atorvastatin (LIPITOR) 40 MG tablet Take 40 mg by mouth at  bedtime.   . Calcium Carb-Cholecalciferol (CALCIUM-VITAMIN D3) 600-400 MG-UNIT TABS Take 1 tablet by mouth 2 (two) times daily.  . Cholecalciferol (VITAMIN D) 2000 units tablet Take 2,000 Units by mouth daily.   . divalproex (DEPAKOTE) 250 MG DR tablet Take 250 mg by mouth 2 (two) times daily.  Marland Kitchen donepezil (ARICEPT) 10 MG tablet Take 10 mg by mouth at bedtime.  . furosemide (LASIX) 20 MG tablet Take 20 mg by mouth daily.   Marland Kitchen levothyroxine (SYNTHROID, LEVOTHROID) 137 MCG tablet Take 137 mcg by mouth daily before breakfast.  . memantine (NAMENDA) 10 MG tablet  Take 10 mg by mouth 2 (two) times daily.   . Multiple Vitamin (MULTI-VITAMIN DAILY PO) Take one tablet by mouth once daily   . PARoxetine (PAXIL) 10 MG tablet Take 15 mg by mouth at bedtime. Take 1-1/2 tablets to = 15 mg qd  . sennosides-docusate sodium (SENOKOT-S) 8.6-50 MG tablet Take 2 tablets by mouth daily.   . Skin Protectants, Misc. (MINERIN) CREA Apply topically to ankles twice a day for dry skin.  . [DISCONTINUED] levothyroxine (SYNTHROID, LEVOTHROID) 125 MCG tablet Take 125 mcg by mouth daily before breakfast.   No facility-administered encounter medications on file as of 07/09/2018.     Review of Systems  GENERAL: No change in appetite, no fatigue, no weight changes, no fever, chills or weakness MOUTH and THROAT: Denies oral discomfort, gingival pain or bleeding, pain from teeth or hoarseness   RESPIRATORY: no cough, SOB, DOE, wheezing, hemoptysis CARDIAC: No chest pain, edema or palpitations GI: No abdominal pain, diarrhea, constipation, heart burn, nausea or vomiting GU: Denies dysuria, frequency, hematuria, incontinence, or discharge NEUROLOGICAL: Denies dizziness, syncope, numbness, or headache PSYCHIATRIC: Denies feelings of depression or anxiety. No report of hallucinations, insomnia, paranoia, or agitation   Immunization History  Administered Date(s) Administered  . Influenza-Unspecified 02/17/2016, 04/15/2017  . PPD Test 04/08/2016  . Pneumococcal Conjugate-13 01/11/2017  . Pneumococcal-Unspecified 02/16/2014   Pertinent  Health Maintenance Due  Topic Date Due  . INFLUENZA VACCINE  01/16/2018  . HEMOGLOBIN A1C  07/07/2018  . OPHTHALMOLOGY EXAM  05/08/2019 (Originally 10/12/1944)  . FOOT EXAM  06/10/2019  . PNA vac Low Risk Adult  Completed  . URINE MICROALBUMIN  Discontinued  . DEXA SCAN  Discontinued   Fall Risk  01/14/2018 01/10/2017 08/07/2016 04/20/2016  Falls in the past year? No No Yes Yes  Number falls in past yr: - - 2 or more 2 or more  Injury with  Fall? - - - Yes  Risk Factor Category  - - - High Fall Risk     Vitals:   07/09/18 1038  BP: 124/77  Pulse: (!) 58  Resp: 20  Temp: 97.9 F (36.6 C)  TempSrc: Oral  SpO2: 92%  Weight: 209 lb 3.2 oz (94.9 kg)  Height: 5\' 9"  (1.753 m)   Body mass index is 30.89 kg/m.  Physical Exam  GENERAL APPEARANCE: Well nourished. In no acute distress. Obese SKIN:  Skin is warm and dry.  MOUTH and THROAT: Lips are without lesions. Oral mucosa is moist and without lesions. Tongue is normal in shape, size, and color and without lesions RESPIRATORY: Breathing is even & unlabored, BS CTAB CARDIAC: RRR, no murmur,no extra heart sounds, no edema GI: Abdomen soft, normal BS, no masses, no tenderness EXTREMITIES:  Able to move X 4 extremities NEUROLOGICAL: There is no tremor. Speech is clear. Alert to self, disoriented to time and place. PSYCHIATRIC:  Affect and behavior are appropriate  Labs reviewed: Recent Labs    02/18/18 03/04/18 06/30/18  NA 146 146 144  K 4.1 4.2 4.2  BUN 13 19 16   CREATININE 0.8 0.8 0.9   Recent Labs    01/04/18 02/18/18 03/04/18  AST 18 21 31   ALT 9 11 15   ALKPHOS 69 80 80   Recent Labs    02/18/18 03/04/18 06/20/18  WBC 3.7 4.0 3.9  NEUTROABS 1 1 1   HGB 13.5 12.3 12.5  HCT 40 37 37  PLT 121* 135* 126*   Lab Results  Component Value Date   TSH 11.43 (A) 06/26/2018   Lab Results  Component Value Date   HGBA1C 5.9 01/04/2018   Lab Results  Component Value Date   CHOL 143 10/04/2016   HDL 48 10/04/2016   LDLCALC 80 10/04/2016   TRIG 77 10/04/2016    Assessment/Plan  1. Advanced care planning/counseling discussion -Discussed current medications and plan of care with.  IDT  2. Other specified hypothyroidism Lab Results  Component Value Date   TSH 11.43 (A) 06/26/2018  -For repeat TSH on 08/08/2018, continue levothyroxine 137 mcg 1 tab daily   3. Mood disorder with psychosis (HCC) -Stable, continue divalproex sodium 250 mg 1 tab twice a  day and aripiprazole 5 mg 1 tab daily  4. Lower extremity edema - no edema noted, will decrease Lasix 20 mg daily to  MWF  5. Mixed hyperlipidemia -Continue Lipitor 40 mg 1 tab at bedtime  6. Alzheimer's disease of other onset with behavioral disturbance (HCC) -Stable, continue memantine 10 mg 1 tab twice a day and donepezil 10 mg 1 tab nightly  7.  Recurrent depression -Continue paroxetine 10 mg 1 1/2 tab = 15 mg daily, followed up by team health psych NP    Family/ staff Communication: Discussed plan of care with IDT.  Labs/tests ordered: Vitamin B12  Goals of care:   Long-term care.   Kenard Gower, NP [facility] [phone] (Monday-Friday 8:00 a.m. - 5:00 p.m.) [phone] (after hours)

## 2018-07-14 DIAGNOSIS — D649 Anemia, unspecified: Secondary | ICD-10-CM | POA: Diagnosis not present

## 2018-07-14 DIAGNOSIS — I10 Essential (primary) hypertension: Secondary | ICD-10-CM | POA: Diagnosis not present

## 2018-07-14 DIAGNOSIS — R300 Dysuria: Secondary | ICD-10-CM | POA: Diagnosis not present

## 2018-07-14 DIAGNOSIS — Z79899 Other long term (current) drug therapy: Secondary | ICD-10-CM | POA: Diagnosis not present

## 2018-07-14 LAB — VITAMIN B12: VITAMIN B 12: 317

## 2018-07-15 NOTE — Progress Notes (Signed)
Folic acid 17.4

## 2018-07-30 DIAGNOSIS — R2689 Other abnormalities of gait and mobility: Secondary | ICD-10-CM | POA: Diagnosis not present

## 2018-07-30 DIAGNOSIS — R262 Difficulty in walking, not elsewhere classified: Secondary | ICD-10-CM | POA: Diagnosis not present

## 2018-07-30 DIAGNOSIS — Z9181 History of falling: Secondary | ICD-10-CM | POA: Diagnosis not present

## 2018-07-31 ENCOUNTER — Encounter: Payer: Self-pay | Admitting: Internal Medicine

## 2018-07-31 ENCOUNTER — Non-Acute Institutional Stay (SKILLED_NURSING_FACILITY): Payer: Medicare Other | Admitting: Internal Medicine

## 2018-07-31 DIAGNOSIS — I10 Essential (primary) hypertension: Secondary | ICD-10-CM | POA: Diagnosis not present

## 2018-07-31 DIAGNOSIS — E1129 Type 2 diabetes mellitus with other diabetic kidney complication: Secondary | ICD-10-CM

## 2018-07-31 DIAGNOSIS — N189 Chronic kidney disease, unspecified: Secondary | ICD-10-CM

## 2018-07-31 DIAGNOSIS — D696 Thrombocytopenia, unspecified: Secondary | ICD-10-CM | POA: Diagnosis not present

## 2018-07-31 DIAGNOSIS — Z9181 History of falling: Secondary | ICD-10-CM | POA: Diagnosis not present

## 2018-07-31 DIAGNOSIS — F0281 Dementia in other diseases classified elsewhere with behavioral disturbance: Secondary | ICD-10-CM | POA: Diagnosis not present

## 2018-07-31 DIAGNOSIS — G308 Other Alzheimer's disease: Secondary | ICD-10-CM

## 2018-07-31 DIAGNOSIS — R2689 Other abnormalities of gait and mobility: Secondary | ICD-10-CM | POA: Diagnosis not present

## 2018-07-31 DIAGNOSIS — E038 Other specified hypothyroidism: Secondary | ICD-10-CM

## 2018-07-31 DIAGNOSIS — R262 Difficulty in walking, not elsewhere classified: Secondary | ICD-10-CM | POA: Diagnosis not present

## 2018-07-31 NOTE — Progress Notes (Signed)
   NURSING HOME LOCATION:  [facility] ROOM NUMBER:  [id number]  CODE STATUS:  Full Code  PCP:  Pecola Lawless, MD  [address] [place] [address]   This is a nursing facility follow up of chronic medical diagnoses.   Interim medical record and care since last [facility] visit was updated with review of diagnostic studies and change in clinical status since last visit were documented.  HPI: She is a permanent resident of the SNF with medical diagnoses of diabetes complicated by nephropathy, dementia with behavioral disturbances, dyslipidemia, essential hypertension, and hypothyroidism.  Staff states that she has "good and bad days".  The bad days are manifested by resistance to care needs and aggressive behavior toward caregiver. Labs were performed last month.  BMET and CBC were normal except for platelet count of 126,000.  No bleeding dyscrasias have been reported.  On 06/26/2018 TSH was 11.43.  L-thyroxine was increased to 137 mcg daily 1/11.  Review of systems: Dementia invalidated responses.  She gave her date of birth correctly but cannot tell me how old she is.  She did state that she was "old enough".  She was unable to give me the year.  Her only complaint is "just cannot walk".  PT is evaluating her.  Constitutional: No fever, significant weight change, fatigue  Cardiovascular: No chest pain, palpitations, paroxysmal nocturnal dyspnea, claudication, edema  Respiratory: No cough, sputum production, hemoptysis   Gastrointestinal: No heartburn, dysphagia, abdominal pain, nausea /vomiting, rectal bleeding, melena, change in bowels Genitourinary: No dysuria, hematuria, pyuria, incontinence, nocturia Musculoskeletal: No joint stiffness, joint swelling, pain Dermatologic: No rash, pruritus, change in appearance of skin Neurologic: No dizziness, headache, syncope, seizures Psychiatric: No significant anxiety, depression, insomnia, anorexia Endocrine: No change in  hair/skin/nails, excessive thirst, excessive hunger, excessive urination  Hematologic/lymphatic: No significant bruising, lymphadenopathy, abnormal bleeding  Physical exam:  Pertinent or positive findings: Affect is flat.  Heart rate is slow but regular.  She is weak to opposition in extremities especially in the right lower extremity.  General appearance: Adequately nourished; no acute distress, increased work of breathing is present.   Lymphatic: No lymphadenopathy about the head, neck, axilla. Eyes: No conjunctival inflammation or lid edema is present. There is no scleral icterus. Ears:  External ear exam shows no significant lesions or deformities.   Nose:  External nasal examination shows no deformity or inflammation. Nasal mucosa are pink and moist without lesions, exudates Oral exam:  Lips and gums are healthy appearing. There is no oropharyngeal erythema or exudate. Neck:  No thyromegaly, masses, tenderness noted.    Heart:  No gallop, murmur, click, rub .  Lungs: without wheezes, rhonchi, rales, rubs. Abdomen: Bowel sounds are normal. Abdomen is soft and nontender with no organomegaly, hernias, masses. GU: Deferred  Extremities:  No cyanosis, clubbing, edema  Neurologic exam : Balance, Rhomberg, finger to nose testing could not be completed due to clinical state Skin: Warm & dry w/o tenting. No significant lesions or rash.  See summary under each active problem in the Problem List with associated updated therapeutic plan

## 2018-07-31 NOTE — Assessment & Plan Note (Signed)
Platelet count 126,000 in January 2020; no bleeding dyscrasias reported

## 2018-07-31 NOTE — Assessment & Plan Note (Signed)
Renal function normal in January 2020

## 2018-07-31 NOTE — Assessment & Plan Note (Signed)
A1c was 5.9% on 01/04/2018; it will be updated\ She remains on Abilify which can raise glucose up to 18%

## 2018-07-31 NOTE — Assessment & Plan Note (Addendum)
Permissive hypertension safer than hypotension with cns issues. No change in antihypertensive medications

## 2018-07-31 NOTE — Assessment & Plan Note (Addendum)
06/26/2018 TSH 11.43; L-thyroxine dose changed to 137 mcg daily on 06/28/2018. TSH should be repeated 8-10 weeks following the increase in dose.

## 2018-08-01 DIAGNOSIS — E119 Type 2 diabetes mellitus without complications: Secondary | ICD-10-CM | POA: Diagnosis not present

## 2018-08-01 DIAGNOSIS — R262 Difficulty in walking, not elsewhere classified: Secondary | ICD-10-CM | POA: Diagnosis not present

## 2018-08-01 DIAGNOSIS — Z9181 History of falling: Secondary | ICD-10-CM | POA: Diagnosis not present

## 2018-08-01 DIAGNOSIS — R2689 Other abnormalities of gait and mobility: Secondary | ICD-10-CM | POA: Diagnosis not present

## 2018-08-01 DIAGNOSIS — R7309 Other abnormal glucose: Secondary | ICD-10-CM | POA: Diagnosis not present

## 2018-08-01 LAB — HEMOGLOBIN A1C: Hemoglobin A1C: 6.1

## 2018-08-02 ENCOUNTER — Encounter: Payer: Self-pay | Admitting: Internal Medicine

## 2018-08-02 NOTE — Patient Instructions (Signed)
See assessment and plan under each diagnosis in the problem list and acutely for this visit 

## 2018-08-04 DIAGNOSIS — R2689 Other abnormalities of gait and mobility: Secondary | ICD-10-CM | POA: Diagnosis not present

## 2018-08-04 DIAGNOSIS — R262 Difficulty in walking, not elsewhere classified: Secondary | ICD-10-CM | POA: Diagnosis not present

## 2018-08-04 DIAGNOSIS — Z9181 History of falling: Secondary | ICD-10-CM | POA: Diagnosis not present

## 2018-08-05 DIAGNOSIS — R262 Difficulty in walking, not elsewhere classified: Secondary | ICD-10-CM | POA: Diagnosis not present

## 2018-08-05 DIAGNOSIS — Z9181 History of falling: Secondary | ICD-10-CM | POA: Diagnosis not present

## 2018-08-05 DIAGNOSIS — R2689 Other abnormalities of gait and mobility: Secondary | ICD-10-CM | POA: Diagnosis not present

## 2018-08-06 DIAGNOSIS — R262 Difficulty in walking, not elsewhere classified: Secondary | ICD-10-CM | POA: Diagnosis not present

## 2018-08-06 DIAGNOSIS — Z9181 History of falling: Secondary | ICD-10-CM | POA: Diagnosis not present

## 2018-08-06 DIAGNOSIS — R2689 Other abnormalities of gait and mobility: Secondary | ICD-10-CM | POA: Diagnosis not present

## 2018-08-07 DIAGNOSIS — R2689 Other abnormalities of gait and mobility: Secondary | ICD-10-CM | POA: Diagnosis not present

## 2018-08-07 DIAGNOSIS — Z9181 History of falling: Secondary | ICD-10-CM | POA: Diagnosis not present

## 2018-08-07 DIAGNOSIS — R262 Difficulty in walking, not elsewhere classified: Secondary | ICD-10-CM | POA: Diagnosis not present

## 2018-08-08 DIAGNOSIS — R2689 Other abnormalities of gait and mobility: Secondary | ICD-10-CM | POA: Diagnosis not present

## 2018-08-08 DIAGNOSIS — Z9181 History of falling: Secondary | ICD-10-CM | POA: Diagnosis not present

## 2018-08-08 DIAGNOSIS — R262 Difficulty in walking, not elsewhere classified: Secondary | ICD-10-CM | POA: Diagnosis not present

## 2018-08-09 DIAGNOSIS — E039 Hypothyroidism, unspecified: Secondary | ICD-10-CM | POA: Diagnosis not present

## 2018-08-09 DIAGNOSIS — R69 Illness, unspecified: Secondary | ICD-10-CM | POA: Diagnosis not present

## 2018-08-11 DIAGNOSIS — R2689 Other abnormalities of gait and mobility: Secondary | ICD-10-CM | POA: Diagnosis not present

## 2018-08-11 DIAGNOSIS — R262 Difficulty in walking, not elsewhere classified: Secondary | ICD-10-CM | POA: Diagnosis not present

## 2018-08-11 DIAGNOSIS — Z9181 History of falling: Secondary | ICD-10-CM | POA: Diagnosis not present

## 2018-08-12 ENCOUNTER — Encounter: Payer: Self-pay | Admitting: Internal Medicine

## 2018-08-12 DIAGNOSIS — Z9181 History of falling: Secondary | ICD-10-CM | POA: Diagnosis not present

## 2018-08-12 DIAGNOSIS — R262 Difficulty in walking, not elsewhere classified: Secondary | ICD-10-CM | POA: Diagnosis not present

## 2018-08-12 DIAGNOSIS — R2689 Other abnormalities of gait and mobility: Secondary | ICD-10-CM | POA: Diagnosis not present

## 2018-08-13 DIAGNOSIS — F339 Major depressive disorder, recurrent, unspecified: Secondary | ICD-10-CM | POA: Diagnosis not present

## 2018-08-13 DIAGNOSIS — F39 Unspecified mood [affective] disorder: Secondary | ICD-10-CM | POA: Diagnosis not present

## 2018-08-13 DIAGNOSIS — R2689 Other abnormalities of gait and mobility: Secondary | ICD-10-CM | POA: Diagnosis not present

## 2018-08-13 DIAGNOSIS — Z9181 History of falling: Secondary | ICD-10-CM | POA: Diagnosis not present

## 2018-08-13 DIAGNOSIS — R262 Difficulty in walking, not elsewhere classified: Secondary | ICD-10-CM | POA: Diagnosis not present

## 2018-08-13 DIAGNOSIS — G309 Alzheimer's disease, unspecified: Secondary | ICD-10-CM | POA: Diagnosis not present

## 2018-08-13 DIAGNOSIS — F0281 Dementia in other diseases classified elsewhere with behavioral disturbance: Secondary | ICD-10-CM | POA: Diagnosis not present

## 2018-08-15 DIAGNOSIS — R2689 Other abnormalities of gait and mobility: Secondary | ICD-10-CM | POA: Diagnosis not present

## 2018-08-15 DIAGNOSIS — Z9181 History of falling: Secondary | ICD-10-CM | POA: Diagnosis not present

## 2018-08-15 DIAGNOSIS — R262 Difficulty in walking, not elsewhere classified: Secondary | ICD-10-CM | POA: Diagnosis not present

## 2018-08-16 DIAGNOSIS — R2689 Other abnormalities of gait and mobility: Secondary | ICD-10-CM | POA: Diagnosis not present

## 2018-08-16 DIAGNOSIS — Z9181 History of falling: Secondary | ICD-10-CM | POA: Diagnosis not present

## 2018-08-16 DIAGNOSIS — R262 Difficulty in walking, not elsewhere classified: Secondary | ICD-10-CM | POA: Diagnosis not present

## 2018-08-18 DIAGNOSIS — R262 Difficulty in walking, not elsewhere classified: Secondary | ICD-10-CM | POA: Diagnosis not present

## 2018-08-18 DIAGNOSIS — Z9181 History of falling: Secondary | ICD-10-CM | POA: Diagnosis not present

## 2018-08-18 DIAGNOSIS — R2689 Other abnormalities of gait and mobility: Secondary | ICD-10-CM | POA: Diagnosis not present

## 2018-08-19 DIAGNOSIS — R262 Difficulty in walking, not elsewhere classified: Secondary | ICD-10-CM | POA: Diagnosis not present

## 2018-08-19 DIAGNOSIS — R2689 Other abnormalities of gait and mobility: Secondary | ICD-10-CM | POA: Diagnosis not present

## 2018-08-19 DIAGNOSIS — Z9181 History of falling: Secondary | ICD-10-CM | POA: Diagnosis not present

## 2018-08-20 DIAGNOSIS — R2689 Other abnormalities of gait and mobility: Secondary | ICD-10-CM | POA: Diagnosis not present

## 2018-08-20 DIAGNOSIS — R262 Difficulty in walking, not elsewhere classified: Secondary | ICD-10-CM | POA: Diagnosis not present

## 2018-08-20 DIAGNOSIS — Z9181 History of falling: Secondary | ICD-10-CM | POA: Diagnosis not present

## 2018-08-21 ENCOUNTER — Encounter: Payer: Self-pay | Admitting: Internal Medicine

## 2018-08-21 ENCOUNTER — Non-Acute Institutional Stay (SKILLED_NURSING_FACILITY): Payer: Medicare Other | Admitting: Internal Medicine

## 2018-08-21 DIAGNOSIS — E038 Other specified hypothyroidism: Secondary | ICD-10-CM | POA: Diagnosis not present

## 2018-08-21 DIAGNOSIS — G308 Other Alzheimer's disease: Secondary | ICD-10-CM

## 2018-08-21 DIAGNOSIS — F02818 Dementia in other diseases classified elsewhere, unspecified severity, with other behavioral disturbance: Secondary | ICD-10-CM

## 2018-08-21 DIAGNOSIS — F0281 Dementia in other diseases classified elsewhere with behavioral disturbance: Secondary | ICD-10-CM | POA: Diagnosis not present

## 2018-08-21 DIAGNOSIS — E782 Mixed hyperlipidemia: Secondary | ICD-10-CM | POA: Diagnosis not present

## 2018-08-21 DIAGNOSIS — E1129 Type 2 diabetes mellitus with other diabetic kidney complication: Secondary | ICD-10-CM | POA: Diagnosis not present

## 2018-08-21 DIAGNOSIS — D696 Thrombocytopenia, unspecified: Secondary | ICD-10-CM | POA: Diagnosis not present

## 2018-08-21 DIAGNOSIS — I10 Essential (primary) hypertension: Secondary | ICD-10-CM

## 2018-08-21 DIAGNOSIS — Z9181 History of falling: Secondary | ICD-10-CM | POA: Diagnosis not present

## 2018-08-21 DIAGNOSIS — R262 Difficulty in walking, not elsewhere classified: Secondary | ICD-10-CM | POA: Diagnosis not present

## 2018-08-21 DIAGNOSIS — R2689 Other abnormalities of gait and mobility: Secondary | ICD-10-CM | POA: Diagnosis not present

## 2018-08-21 NOTE — Progress Notes (Signed)
Location:    [facility] Room Number: 214/A Place of Service:  SNF [phone]) Provider:  Edmon Crape PA-C  Pecola Lawless, MD  Patient Care Team: Pecola Lawless, MD as PCP - General (Internal Medicine)  Extended Emergency Contact Information Primary Emergency Contact: Robinson,Roberto Address: [address]          [place], [place] [address] [place] Home Phone: [phone] Relation: Son Secondary Emergency Contact: Reid,Cynthia  [place] Mobile Phone: [phone] Relation: Daughter  Code Status:  Full Code Goals of care: Advanced Directive information Advanced Directives 08/21/2018  Does Patient Have a Medical Advance Directive? Yes  Type of Advance Directive (No Data)  Does patient want to make changes to medical advance directive? No - Patient declined  Copy of Healthcare Power of Attorney in Chart? -  Would patient like information on creating a medical advance directive? -  Pre-existing out of facility DNR order (yellow form or pink MOST form) -     Chief Complaint  Patient presents with  . Medical Management of Chronic Issues    Routine visit of medicalmanagement  . Best Practice Recommendations    Due A1c Hemoglobin,Flu shot  Medical management of chronic medical conditions including dementia with behaviors- hypertension-history of type 2 diabetes with nephropathy- thyroidism- edema-hyperlipidemia  HPI:  Pt is a 84 y.o. female seen today for medical management of chronic diseases.  Patient does have significant dementia but this appears at this point to be relatively well controlled she does have a history of mood disorder with behaviors continues on Namenda 10 mg twice daily Aricept 10 mg a day she is also on Depakote 250 mg twice daily as well as Abilify 5 mg a day-  Is followed by psychiatric services as well.  In regards to diabetes this appears to be diet-controlled hemoglobin A1c was  6.1 last month-.  In regards to hypertension there is recommendation to allow somewhat permissive hypertension with concerns for hypotension but pressure today is 118/76 previous blood pressure 124/76 occasional spikes but this does not appear to be consistent.  She is on Lasix 20 mg Monday Wednesday and Friday she is also on edema.  Regards hypothyroidism she is on Synthroid TSH was increased in January because TSH was elevated at 11.43 this will need updating.  Regards to edema this appears relatively well controlled her edema is quite mild 210 pounds.  She does have a history of mild thrombocytopenia with platelet count 126,000 on January lab will update this.  Currently she is lying in bed comfortably does not really have any acute complaints   Past Medical History:  Diagnosis Date  . Chronic kidney disease    Stage III  . Dementia with behavioral disturbance (HCC) 03/19/2015   Notated on FL2 Form from Dr. Dreama Saa [phone]  . Depression    previously saw Dr Donell Beers  . Diabetes mellitus without complication (HCC)    11/18/15 A1c 6%  . Dyslipidemia    11/18/15 Tg 74, HDL 60, LDL 90 on statin  . HLD (hyperlipidemia)   . Hypertension   . Hypothyroidism    11/18/15 TSH 53.6, 02/07/16 TSH 9.42 @ [facility]  . MDD (major depressive disorder)    With behavioral disturbance  . Scabies    02/21/16 [facility],[place]; Rx: Permethrin cream  . Vitamin D deficiency    11/18/15 vitamin D 25-hydroxy 39   Past Surgical History:  Procedure Laterality Date  .  no record     04/10/15 [facility] note: see old chart    No Known Allergies  Allergies as of 08/21/2018   No Known Allergies     Medication List       Accurate as of August 21, 2018 12:18 PM. Always use your most recent med list.        acetaminophen 325 MG tablet Commonly known as:  TYLENOL Take 650 mg by mouth every 6 (six) hours as needed for mild pain or moderate pain.   ARIPiprazole 5 MG tablet Commonly  known as:  ABILIFY Take 5 mg by mouth daily.   aspirin EC 81 MG tablet Take 81 mg by mouth daily.   atorvastatin 40 MG tablet Commonly known as:  LIPITOR Take 40 mg by mouth at bedtime.   Calcium-Vitamin D3 600-400 MG-UNIT Tabs Take 1 tablet by mouth 2 (two) times daily.   divalproex 250 MG DR tablet Commonly known as:  DEPAKOTE Take 250 mg by mouth 2 (two) times daily.   donepezil 10 MG tablet Commonly known as:  ARICEPT Take 10 mg by mouth at bedtime.   furosemide 20 MG tablet Commonly known as:  LASIX Take 20 mg by mouth 3 (three) times a week. M-W-F   levothyroxine 137 MCG tablet Commonly known as:  SYNTHROID, LEVOTHROID Take 137 mcg by mouth daily before breakfast.   memantine 10 MG tablet Commonly known as:  NAMENDA Take 10 mg by mouth 2 (two) times daily.   MINERIN Crea Apply topically to ankles twice a day for dry skin.   MULTI-VITAMIN DAILY PO Take one tablet by mouth once daily   PAXIL 10 MG tablet Generic drug:  PARoxetine Take 15 mg by mouth at bedtime. Take 1-1/2 tablets to = 15 mg qd   sennosides-docusate sodium 8.6-50 MG tablet Commonly known as:  SENOKOT-S Take 2 tablets by mouth daily.   Vitamin D 50 MCG (2000 UT) tablet Take 2,000 Units by mouth daily.       Review of Systems   Is is very limited secondary to dementia-nursing has not reported any recent acute issues when asked her she is not complaining of any chest pain or shortness of breath or discomfort.    Immunization History  Administered Date(s) Administered  . Influenza-Unspecified 02/17/2016, 04/15/2017  . PPD Test 04/08/2016  . Pneumococcal Conjugate-13 01/11/2017  . Pneumococcal-Unspecified 02/16/2014   Pertinent  Health Maintenance Due  Topic Date Due  . INFLUENZA VACCINE  09/21/2018 (Originally 01/16/2018)  . HEMOGLOBIN A1C  09/21/2018 (Originally 07/07/2018)  . OPHTHALMOLOGY EXAM  05/08/2019 (Originally 10/12/1944)  . FOOT EXAM  06/10/2019  . PNA vac Low Risk Adult   Completed  . URINE MICROALBUMIN  Discontinued  . DEXA SCAN  Discontinued   Fall Risk  01/14/2018 01/10/2017 08/07/2016 04/20/2016  Falls in the past year? No No Yes Yes  Number falls in past yr: - - 2 or more 2 or more  Injury with Fall? - - - Yes  Risk Factor Category  - - - High Fall Risk   Functional Status Survey:    Vitals:   08/21/18 1153  BP: 118/76  Pulse: 80  Resp: 18  Temp: 98.7 F (37.1 C)  TempSrc: Oral  SpO2: 95%  Weight: 210 lb 12.8 oz (95.6 kg)  Height:  (1.753 m)   Body mass index is 31.13 kg/m. Physical Exam   General female no distress she is resting comfortably in bed.  Her skin is warm and  dry.  Eyes visual acuity appears to be intact sclera and conjunctive are clear oropharynx is clear mucous membranes moist.  Chest is clear to auscultation with somewhat shallow air entry there is no labored breathing.  Slightly bradycardic 30s on exam- she has quite mild lower extremity edema abdomen is soft nontender with positive bowel sounds.  Musculoskeletal has lower extremity weakness is able to move all extremities x4 grip strength appears to be fairly strong bilaterally.  As noted above she is alert her speech is clear.  Psych  Does follow simple verbal commands is pleasant and cooperative with exam  Labs reviewed:  August 01, 2018.  Hemoglobin A1c 6.1.   Recent Labs    02/18/18 03/04/18 06/30/18  NA 146 146 144  K 4.1 4.2 4.2  BUN 13 19 16   CREATININE 0.8 0.8 0.9   Recent Labs    01/04/18 02/18/18 03/04/18  AST 18 21 31   ALT 9 11 15   ALKPHOS 69 80 80   Recent Labs    02/18/18 03/04/18 06/20/18  WBC 3.7 4.0 3.9  NEUTROABS 1 1 1   HGB 13.5 12.3 12.5  HCT 40 37 37  PLT 121* 135* 126*   Lab Results  Component Value Date   TSH 11.43 (A) 06/26/2018   Lab Results  Component Value Date   HGBA1C 5.9 01/04/2018   Lab Results  Component Value Date   CHOL 143 10/04/2016   HDL 48 10/04/2016   LDLCALC 80 10/04/2016   TRIG 77  10/04/2016    Significant Diagnostic Results in last 30 days:  No results found.  Assessment/Plan  1 history of dementia with behaviors at this point appears relatively well controlled her weight appears to be stable.  She continues on Namenda 10 mg twice daily as well as Aricept 10 mg a day  For mood disorder she is followed by psychiatric services and continues on Abilify 5 mg a day and Depakote 250 mg twice daily she also is on Paxil 50 mg a day for associated depression.   #2- history of hypertension this appears relatively well controlled she is only on Lasix 3 days a week-again recommendation is to allow some permissive hypertension secondary to hypotension concerns  #3 hypothyroidism-TSH was elevated in January at over 11 Synthroid dose was increased currently on 137 mcg a day will have this updated  #4 history of diabetes- nephropathy- this appears to be well controlled diet wise hemoglobin A1c was 6.1 last month- this is shown stability with previous hemoglobin A1c- for neuropathy creatinine was 0.9 on lab done in January will update this this appears to be relatively stable as well     5 history of thrombocytopenia this appears to be fairly mild with a platelet count of 126,000 on January lab bruising or bleeding on exam-we will have this updated  #6- history of edema as noted above this appears to be stable her weight is stable she is on Lasix 20 mg Monday Wednesday and Friday   #7 history of hyperlipidemia she is on Lipitor 40 mg a day will update a lipid panel-LDL was 81 on lipid panel done back in April 2018  8-- vitamin D deficiency?  She is on supplementation   .  [id number]

## 2018-08-22 DIAGNOSIS — R262 Difficulty in walking, not elsewhere classified: Secondary | ICD-10-CM | POA: Diagnosis not present

## 2018-08-22 DIAGNOSIS — R6889 Other general symptoms and signs: Secondary | ICD-10-CM | POA: Diagnosis not present

## 2018-08-22 DIAGNOSIS — Z9181 History of falling: Secondary | ICD-10-CM | POA: Diagnosis not present

## 2018-08-22 DIAGNOSIS — D649 Anemia, unspecified: Secondary | ICD-10-CM | POA: Diagnosis not present

## 2018-08-22 DIAGNOSIS — E039 Hypothyroidism, unspecified: Secondary | ICD-10-CM | POA: Diagnosis not present

## 2018-08-22 DIAGNOSIS — E785 Hyperlipidemia, unspecified: Secondary | ICD-10-CM | POA: Diagnosis not present

## 2018-08-22 DIAGNOSIS — R2689 Other abnormalities of gait and mobility: Secondary | ICD-10-CM | POA: Diagnosis not present

## 2018-08-22 LAB — LIPID PANEL
Cholesterol: 167 (ref 0–200)
HDL: 49 (ref 35–70)
LDL Cholesterol: 106
LDl/HDL Ratio: 3.4
Triglycerides: 58 (ref 40–160)

## 2018-08-22 LAB — BASIC METABOLIC PANEL
BUN: 15 (ref 4–21)
Creatinine: 0.8 (ref 0.5–1.1)
Glucose: 93
Potassium: 3.9 (ref 3.4–5.3)
Sodium: 147 (ref 137–147)

## 2018-08-22 LAB — TSH: TSH: 14.01 — AB (ref 0.41–5.90)

## 2018-08-22 LAB — CBC AND DIFFERENTIAL
HCT: 36 (ref 36–46)
Hemoglobin: 12.2 (ref 12.0–16.0)
Neutrophils Absolute: 1
Platelets: 129 — AB (ref 150–399)
WBC: 3.6

## 2018-08-25 DIAGNOSIS — R2689 Other abnormalities of gait and mobility: Secondary | ICD-10-CM | POA: Diagnosis not present

## 2018-08-25 DIAGNOSIS — R262 Difficulty in walking, not elsewhere classified: Secondary | ICD-10-CM | POA: Diagnosis not present

## 2018-08-25 DIAGNOSIS — Z9181 History of falling: Secondary | ICD-10-CM | POA: Diagnosis not present

## 2018-08-26 DIAGNOSIS — Z9181 History of falling: Secondary | ICD-10-CM | POA: Diagnosis not present

## 2018-08-26 DIAGNOSIS — R2689 Other abnormalities of gait and mobility: Secondary | ICD-10-CM | POA: Diagnosis not present

## 2018-08-26 DIAGNOSIS — R262 Difficulty in walking, not elsewhere classified: Secondary | ICD-10-CM | POA: Diagnosis not present

## 2018-09-01 DIAGNOSIS — I129 Hypertensive chronic kidney disease with stage 1 through stage 4 chronic kidney disease, or unspecified chronic kidney disease: Secondary | ICD-10-CM | POA: Diagnosis not present

## 2018-09-01 DIAGNOSIS — D649 Anemia, unspecified: Secondary | ICD-10-CM | POA: Diagnosis not present

## 2018-09-01 DIAGNOSIS — M6281 Muscle weakness (generalized): Secondary | ICD-10-CM | POA: Diagnosis not present

## 2018-09-01 DIAGNOSIS — N183 Chronic kidney disease, stage 3 (moderate): Secondary | ICD-10-CM | POA: Diagnosis not present

## 2018-09-01 LAB — BASIC METABOLIC PANEL
BUN: 15 (ref 4–21)
Creatinine: 0.8 (ref 0.5–1.1)
Glucose: 96
Potassium: 4.1 (ref 3.4–5.3)
Sodium: 145 (ref 137–147)

## 2018-09-22 ENCOUNTER — Encounter: Payer: Self-pay | Admitting: Internal Medicine

## 2018-09-22 ENCOUNTER — Non-Acute Institutional Stay (SKILLED_NURSING_FACILITY): Payer: Medicare Other | Admitting: Internal Medicine

## 2018-09-22 DIAGNOSIS — F39 Unspecified mood [affective] disorder: Secondary | ICD-10-CM | POA: Diagnosis not present

## 2018-09-22 DIAGNOSIS — D696 Thrombocytopenia, unspecified: Secondary | ICD-10-CM

## 2018-09-22 DIAGNOSIS — F02818 Dementia in other diseases classified elsewhere, unspecified severity, with other behavioral disturbance: Secondary | ICD-10-CM

## 2018-09-22 DIAGNOSIS — M6281 Muscle weakness (generalized): Secondary | ICD-10-CM | POA: Diagnosis not present

## 2018-09-22 DIAGNOSIS — E038 Other specified hypothyroidism: Secondary | ICD-10-CM | POA: Diagnosis not present

## 2018-09-22 DIAGNOSIS — E1129 Type 2 diabetes mellitus with other diabetic kidney complication: Secondary | ICD-10-CM

## 2018-09-22 DIAGNOSIS — F0281 Dementia in other diseases classified elsewhere with behavioral disturbance: Secondary | ICD-10-CM

## 2018-09-22 DIAGNOSIS — I10 Essential (primary) hypertension: Secondary | ICD-10-CM | POA: Diagnosis not present

## 2018-09-22 DIAGNOSIS — G308 Other Alzheimer's disease: Secondary | ICD-10-CM | POA: Diagnosis not present

## 2018-09-22 DIAGNOSIS — E039 Hypothyroidism, unspecified: Secondary | ICD-10-CM | POA: Diagnosis not present

## 2018-09-22 LAB — TSH: TSH: 24.56 — AB (ref 0.41–5.90)

## 2018-09-22 NOTE — Progress Notes (Signed)
Location:    [facility] Room Number: 214/B Place of Service:  SNF [phone]) Provider:  Edmon Crape PA-C  Pecola Lawless, MD  Patient Care Team: Pecola Lawless, MD as PCP - General (Internal Medicine)  Extended Emergency Contact Information Primary Emergency Contact: Robinson,Roberto Address: [address]          [place], [place] [address] [place] Home Phone: [phone] Relation: Son Secondary Emergency Contact: Reid,Cynthia  [place] Mobile Phone: [phone] Relation: Daughter  Code Status:  Full Code Goals of care: Advanced Directive information Advanced Directives 09/22/2018  Does Patient Have a Medical Advance Directive? Yes  Type of Advance Directive (No Data)  Does patient want to make changes to medical advance directive? No - Patient declined  Copy of Healthcare Power of Attorney in Chart? -  Would patient like information on creating a medical advance directive? -  Pre-existing out of facility DNR order (yellow form or pink MOST form) -     Chief Complaint  Patient presents with  . Medical Management of Chronic Issues    Routine visit of medical management  . Best Practice Recommendations    T-Dap  Medical management of chronic medical conditions including dementia with behaviors-hypertension- type 2 diabetes- hypothyroidism- edema-hyperlipidemia  HPI:  Pt is a 84 y.o. female seen today for medical management of chronic diseases.  As noted above.  Nursing is not reported any recent issues.  She does have a history of dementia with behaviors and continues on Namenda 10 mg twice daily as well as Aricept 10 mg a day as well as Depakote 250 mg twice daily in addition to Abilify 5 mg a day.  She remains confused but apparently behaviors are well controlled she was pleasant and cooperative during exam.   Followed by psychiatric services.  She is a type II diabetic this is  diet-controlled hemoglobin A1c was 6.1 in February.  She also has a history of hypothyroidism and the lab today actually showed a significantly elevated TSH of 24- she is on Synthroid 150 mcg a day it appears this was increased about a month ago secondary to had a TSH 14.  She does have some history of low TSH in the past as well.  In regards to hypertension there has been recommendation to allow somewhat permissive hypertension with concerns for hypotension.  Blood pressures recently ranged from 143/69-122/65-125/65- there does not appear to be consistent elevations at this point will monitor she is on Lasix 3 times a week.  She does have a history of edema which is Lasix is used to control she has some mild edema but appears to be somewhat increased on the right this afternoon.  He does not really complain of any pain today again does continue to be somewhat confused   Past Medical History:  Diagnosis Date  . Chronic kidney disease    Stage III  . Dementia with behavioral disturbance (HCC) 03/19/2015   Notated on FL2 Form from Dr. Dreama Saa [phone]  . Depression    previously saw Dr Donell Beers  . Diabetes mellitus without complication (HCC)    11/18/15 A1c 6%  . Dyslipidemia    11/18/15 Tg 74, HDL 60, LDL 90 on statin  . HLD (hyperlipidemia)   . Hypertension   . Hypothyroidism    11/18/15 TSH 53.6, 02/07/16 TSH 9.42 @ [facility]  . MDD (major depressive disorder)    With behavioral disturbance  .  Scabies    02/21/16 [facility],[place]; Rx: Permethrin cream  . Vitamin D deficiency    11/18/15 vitamin D 25-hydroxy 39   Past Surgical History:  Procedure Laterality Date  . no record     04/10/15 [facility] note: see old chart    No Known Allergies  Allergies as of 09/22/2018   No Known Allergies     Medication List       Accurate as of September 22, 2018  9:47 AM. Always use your most recent med list.        acetaminophen 325 MG tablet Commonly known as:  TYLENOL  Take 650 mg by mouth every 6 (six) hours as needed for mild pain or moderate pain.   ARIPiprazole 5 MG tablet Commonly known as:  ABILIFY Take 5 mg by mouth daily.   aspirin EC 81 MG tablet Take 81 mg by mouth daily.   atorvastatin 40 MG tablet Commonly known as:  LIPITOR Take 40 mg by mouth at bedtime.   Calcium-Vitamin D3 600-400 MG-UNIT Tabs Take 1 tablet by mouth 2 (two) times daily.   divalproex 250 MG DR tablet Commonly known as:  DEPAKOTE Take 250 mg by mouth 2 (two) times daily.   donepezil 10 MG tablet Commonly known as:  ARICEPT Take 10 mg by mouth at bedtime.   furosemide 20 MG tablet Commonly known as:  LASIX Take 20 mg by mouth 3 (three) times a week. M-W-F   levothyroxine 137 MCG tablet Commonly known as:  SYNTHROID, LEVOTHROID Take 137 mcg by mouth daily before breakfast.   memantine 10 MG tablet Commonly known as:  NAMENDA Take 10 mg by mouth 2 (two) times daily.   Minerin Crea Apply topically to ankles twice a day for dry skin.   MULTI-VITAMIN DAILY PO Take one tablet by mouth once daily   Paxil 10 MG tablet Generic drug:  PARoxetine Take 15 mg by mouth at bedtime. Take 1-1/2 tablets to = 15 mg qd   sennosides-docusate sodium 8.6-50 MG tablet Commonly known as:  SENOKOT-S Take 2 tablets by mouth daily.   Vitamin D 50 MCG (2000 UT) tablet Take 2,000 Units by mouth daily.       Review of Systems   Is unobtainable secondary to dementia please see HPI  Immunization History  Administered Date(s) Administered  . Influenza-Unspecified 02/17/2016, 04/15/2017  . PPD Test 04/08/2016  . Pneumococcal Conjugate-13 01/11/2017  . Pneumococcal-Unspecified 02/16/2014   Pertinent  Health Maintenance Due  Topic Date Due  . OPHTHALMOLOGY EXAM  05/08/2019 (Originally 10/12/1944)  . INFLUENZA VACCINE  01/17/2019  . HEMOGLOBIN A1C  01/30/2019  . FOOT EXAM  06/10/2019  . PNA vac Low Risk Adult  Completed  . URINE MICROALBUMIN  Discontinued  . DEXA  SCAN  Discontinued   Fall Risk  01/14/2018 01/10/2017 08/07/2016 04/20/2016  Falls in the past year? No No Yes Yes  Number falls in past yr: - - 2 or more 2 or more  Injury with Fall? - - - Yes  Risk Factor Category  - - - High Fall Risk   Functional Status Survey:    Vitals:   09/22/18 0934  BP: (!) 143/69  Pulse: 63  Resp: 16  Temp: (!) 97.5 F (36.4 C)  TempSrc: Oral  Weight: 209 lb 6.4 oz (95 kg)  Height: 5\' 9"  (1.753 m)   Body mass index is 30.92 kg/m. Physical Exam   In general this is a well-nourished elderly female in no distress  resting comfortably in bed.  Her skin is warm and dry.  Eyes visual acuity appears to be intact clear.  Oropharynx is clear mucous membranes moist.  Chest is clear to auscultation with somewhat poor respiratory effort could not appreciate any overt congestion or labored breathing.  Heart regular rhythm slightly bradycardic in the high 50s she has mild edema on the left somewhat more increased on the right pedal pulses are intact bilateral but reduced edema is cool nonerythematous nontender-  Abdomen is soft nontender with positive bowel sounds.  Musculoskeletal moves all extremities x4 with lower extremity weakness- limited exam secondary patient being in bed  Neurologic as noted above speech is clear  Psych she is oriented to self does follow simple verbal commands is pleasant cooperative but gives confused appearance Labs reviewed:  September 22, 2018.  VPX-10.62  September 01, 2018.  Sodium 145 potassium 4.1 BUN 14.8 creatinine 0.77.  August 22, 2018.  WBC 3.6 hemoglobin 12.2 platelets 129.  Sodium at that time was 147 potassium 3.9 BUN 15.4 creatinine 0.75.  TSH at that point was 14.01.  LDL was 106.   Recent Labs    02/18/18 03/04/18 06/30/18  NA 146 146 144  K 4.1 4.2 4.2  BUN 13 19 16   CREATININE 0.8 0.8 0.9   Recent Labs    01/04/18 02/18/18 03/04/18  AST 18 21 31   ALT 9 11 15   ALKPHOS 69 80 80   Recent Labs     02/18/18 03/04/18 06/20/18  WBC 3.7 4.0 3.9  NEUTROABS 1 1 1   HGB 13.5 12.3 12.5  HCT 40 37 37  PLT 121* 135* 126*   Lab Results  Component Value Date   TSH 11.43 (A) 06/26/2018   Lab Results  Component Value Date   HGBA1C 6.1 08/01/2018   Lab Results  Component Value Date   CHOL 143 10/04/2016   HDL 48 10/04/2016   LDLCALC 80 10/04/2016   TRIG 77 10/04/2016    Significant Diagnostic Results in last 30 days:  No results found.  Assessment/Plan  #1 history of dementia with behaviors-at this point appears to be stable she is on Namenda 10 mg twice daily- Aricept 10 mg a day-as well as Depakote 250 mg twice daily and Abilify 5 mg a day.  She is followed by psychiatric services at this point will monitor.  Her weight appears to be stable.  2.  History of hypertension she is only on Lasix 20 mg 3 times a week- as noted above this appears fairly stable some variable systolics but I do not see consistent elevations over 140.  3.  History of type 2 diabetes this appears to be diet-controlled.  Stable with a hemoglobin A1c of 6.1. This was in February.  4.-  History of hypothyroidism her TSH continues to rise spite being on an increased dose of Synthroid will increase Synthroid up to 175 mcg a day and recheck this in a month clinically she appears to be stable.  5.  History of edema-again weight appears to be stable she appears to have some increased edema on the right leg however and will order a venous Doppler rule out any acute process like a DVT.  6.  History of thrombocytopenia which appears to be mild this appears relatively stable with a platelet count of 129,000 last month which was similar to previous level back in January when it was in the 120s as well   7 history of hyperlipidemia she continues on Lipitor 40  mg a day-LDL was 106 on recent lab data is considering comorbidities and advanced age aggressive here- cholesterol panel showed risk is actually one half of  average with VLDL of 12 triglycerides of 58 HDL 49 and total cholesterol 167  [phone]

## 2018-09-23 DIAGNOSIS — M79604 Pain in right leg: Secondary | ICD-10-CM | POA: Diagnosis not present

## 2018-09-23 DIAGNOSIS — R609 Edema, unspecified: Secondary | ICD-10-CM | POA: Diagnosis not present

## 2018-10-13 ENCOUNTER — Encounter: Payer: Self-pay | Admitting: Internal Medicine

## 2018-10-13 ENCOUNTER — Non-Acute Institutional Stay (SKILLED_NURSING_FACILITY): Payer: Medicare Other | Admitting: Internal Medicine

## 2018-10-13 DIAGNOSIS — I10 Essential (primary) hypertension: Secondary | ICD-10-CM

## 2018-10-13 DIAGNOSIS — F0281 Dementia in other diseases classified elsewhere with behavioral disturbance: Secondary | ICD-10-CM | POA: Diagnosis not present

## 2018-10-13 DIAGNOSIS — E1129 Type 2 diabetes mellitus with other diabetic kidney complication: Secondary | ICD-10-CM

## 2018-10-13 DIAGNOSIS — E782 Mixed hyperlipidemia: Secondary | ICD-10-CM | POA: Diagnosis not present

## 2018-10-13 DIAGNOSIS — E038 Other specified hypothyroidism: Secondary | ICD-10-CM | POA: Diagnosis not present

## 2018-10-13 DIAGNOSIS — G308 Other Alzheimer's disease: Secondary | ICD-10-CM

## 2018-10-13 DIAGNOSIS — F02818 Dementia in other diseases classified elsewhere, unspecified severity, with other behavioral disturbance: Secondary | ICD-10-CM

## 2018-10-13 NOTE — Progress Notes (Signed)
Location:    [facility]  Nursing Home Room Number: 214/B Place of Service:  SNF [phone]) Provider:  Edmon Crape PA-C  Pecola Lawless, MD  Patient Care Team: Pecola Lawless, MD as PCP - General (Internal Medicine)  Extended Emergency Contact Information Primary Emergency Contact: Robinson,Roberto Address: [address]          [place], [place] [address] [place] Home Phone: [phone] Relation: Son Secondary Emergency Contact: Reid,Cynthia  [place] Mobile Phone: [phone] Relation: Daughter  Code Status:  Full Code Goals of care: Advanced Directive information Advanced Directives 10/13/2018  Does Patient Have a Medical Advance Directive? Yes  Type of Advance Directive (No Data)  Does patient want to make changes to medical advance directive? No - Patient declined  Copy of Healthcare Power of Attorney in Chart? -  Would patient like information on creating a medical advance directive? -  Pre-existing out of facility DNR order (yellow form or pink MOST form) -     Chief Complaint  Patient presents with  . Medical Management of Chronic Issues    Routine visit of medicl management  . Best Practice Recommendations    T-Dap, Eye Exam  Medical management of chronic medical conditions including type 2 diabetes-hypothyroidism-edema-hyperlipidemia-dementia with behaviors as well as hypertension  HPI:  Pt is a 84 y.o. female seen today for medical management of chronic diseases.   Nursing does not report any recent acute issues.  She does have a history of dementia with occasional behaviors she is on Namenda 10 mg twice a day as well as Depakote 250 mg twice a day in addition to Aricept 10 mg a day and Abilify 5 mg a day she is followed by psychiatric services.  Nursing does not report any acute issues in regards to her behaviors.  She is not talking a whole lot today but according to staff he has times when she  does not speak much and other times when she will be more talkative.  Towards the end of the exam she did speak more asking if she could have a drink of water which staff provided.  She does have a history of type 2 diabetes which is diet controlle her hemoglobin A1c was 6.1 in February.  She also has a history of hypothyroidism and lab earlier this month showed a rising TSH 12/24 she is on Synthroid 175 mcg which was increased earlier this month had been increased previously because she had a TSH of 14.  Will need an updated TSH in approximately 4 weeks.  She does have a history of a low TSH in the past as well  Regards to hypertension she is only on Lasix 3 times a week blood pressures appear to be relatively stable 116/74-118/70-124/63 recently.  In regards to edema again she is on the Lasix 3 times a week continues to have some mild edema slightly more on the right versus the left which is baseline.  Her weight is 209 which appears to be relatively stable.    Past Medical History:  Diagnosis Date  . Chronic kidney disease    Stage III  . Dementia with behavioral disturbance (HCC) 03/19/2015   Notated on FL2 Form from Dr. Dreama Saa [phone]  . Depression    previously saw Dr Donell Beers  . Diabetes mellitus without complication (HCC)    11/18/15 A1c 6%  . Dyslipidemia    11/18/15 Tg 74, HDL 60, LDL 90  on statin  . HLD (hyperlipidemia)   . Hypertension   . Hypothyroidism    11/18/15 TSH 53.6, 02/07/16 TSH 9.42 @ [facility]  . MDD (major depressive disorder)    With behavioral disturbance  . Scabies    02/21/16 [facility],[place]; Rx: Permethrin cream  . Vitamin D deficiency    11/18/15 vitamin D 25-hydroxy 39   Past Surgical History:  Procedure Laterality Date  . no record     04/10/15 [facility] note: see old chart    No Known Allergies  Allergies as of 10/13/2018   No Known Allergies     Medication List       Accurate as of October 13, 2018  3:26 PM.  Always use your most recent med list.        acetaminophen 325 MG tablet Commonly known as:  TYLENOL Take 650 mg by mouth every 6 (six) hours as needed for mild pain or moderate pain.   ARIPiprazole 5 MG tablet Commonly known as:  ABILIFY Take 5 mg by mouth daily.   aspirin EC 81 MG tablet Take 81 mg by mouth daily.   atorvastatin 40 MG tablet Commonly known as:  LIPITOR Take 40 mg by mouth at bedtime.   Calcium-Vitamin D3 600-400 MG-UNIT Tabs Take 1 tablet by mouth 2 (two) times daily.   divalproex 250 MG DR tablet Commonly known as:  DEPAKOTE Take 250 mg by mouth 2 (two) times daily.   donepezil 10 MG tablet Commonly known as:  ARICEPT Take 10 mg by mouth at bedtime.   furosemide 20 MG tablet Commonly known as:  LASIX Take 20 mg by mouth 3 (three) times a week. M-W-F   levothyroxine 175 MCG tablet Commonly known as:  SYNTHROID Take 175 mcg by mouth daily before breakfast.   memantine 10 MG tablet Commonly known as:  NAMENDA Take 10 mg by mouth 2 (two) times daily.   Minerin Crea Apply topically to ankles twice a day for dry skin.   MULTI-VITAMIN DAILY PO Take one tablet by mouth once daily   Paxil 10 MG tablet Generic drug:  PARoxetine Take 15 mg by mouth at bedtime. Take 1-1/2 tablets to = 15 mg qd   sennosides-docusate sodium 8.6-50 MG tablet Commonly known as:  SENOKOT-S Take 2 tablets by mouth daily.   Vitamin D 50 MCG (2000 UT) tablet Take 2,000 Units by mouth daily.       Review of Systems   Is largely unobtainable secondary to dementia please see HPI  Immunization History  Administered Date(s) Administered  . Influenza-Unspecified 02/17/2016, 04/15/2017  . PPD Test 04/08/2016  . Pneumococcal Conjugate-13 01/11/2017  . Pneumococcal-Unspecified 02/16/2014   Pertinent  Health Maintenance Due  Topic Date Due  . OPHTHALMOLOGY EXAM  05/08/2019 (Originally 10/12/1944)  . INFLUENZA VACCINE  01/17/2019  . HEMOGLOBIN A1C  01/30/2019  . FOOT  EXAM  06/10/2019  . PNA vac Low Risk Adult  Completed  . URINE MICROALBUMIN  Discontinued  . DEXA SCAN  Discontinued   Fall Risk  01/14/2018 01/10/2017 08/07/2016 04/20/2016  Falls in the past year? No No Yes Yes  Number falls in past yr: - - 2 or more 2 or more  Injury with Fall? - - - Yes  Risk Factor Category  - - - High Fall Risk   Functional Status Survey:    Vitals:   10/13/18 1452  BP: 116/74  Pulse: 80  Resp: 18  Temp: 98.7 F (37.1 C)  TempSrc:  Oral  Weight: 209 lb 6.4 oz (95 kg)  Height:  (1.753 m)   Body mass index is 30.92 kg/m. Physical Exam  General this is a well-nourished elderly female in no distress sitting comfortably in her wheelchair.  Her skin is warm and dry.  Eyes visual acuity appears to be intact sclera and conjunctive are clear.  Oropharynx is clear mucous membranes moist.  Chest is clear to auscultation there is no labored breathing -- respiratory effort is somewhat poor since she is not following verbal commands really well today  Heart she has distant heart sounds from what I could hear regular rate and rhythm radial pulse was regular.  She continues to have some mild lower extremity edema this is somewhat more on the right versus the left and comparable to what I saw last visit possibly slightly improved.  Abdomen is soft nontender with positive bowel sounds.  Musculoskeletal moves all extremities x4 with some lower extremity weakness-.  Neurologic cannot really appreciate lateralizing findings her speech is clear she does not speak a whole lot today she is cooperative with exam but does not really follow verbal commands very well.  Psych as noted above.    Labs reviewed:  August 01, 2018.  Hemoglobin A1c 6.1.  September 22, 2018.  TSH-24.56.  September 01, 2018.  Sodium 145 potassium 4.1 BUN 14.8 creatinine 0.77.  August 22, 2018.  WBC 3.6 hemoglobin 12.2 platelets 129.    Recent Labs    02/18/18 03/04/18 06/30/18  NA  146 146 144  K 4.1 4.2 4.2  BUN CREATININE 0.8 0.8 0.9   Recent Labs    01/04/18 02/18/18 03/04/18  AST ALT ALKPHOS 69 80 80   Recent Labs    02/18/18 03/04/18 06/20/18  WBC 3.7 4.0 3.9  NEUTROABS HGB 13.5 12.3 12.5  HCT 40 37 37  PLT 121* 135* 126*   Lab Results  Component Value Date   TSH 11.43 (A) 06/26/2018   Lab Results  Component Value Date   HGBA1C 6.1 08/01/2018   Lab Results  Component Value Date   CHOL 143 10/04/2016   HDL 48 10/04/2016   LDLCALC 80 10/04/2016   TRIG 77 10/04/2016    Significant Diagnostic Results in last 30 days:  No results found.  Assessment/Plan  : #1 history of dementia with behaviors at this point appears relatively well controlled she is on Namenda 10 mg twice a day in addition to Aricept 10 mg a day and Depakote 250 mg twice daily in addition to Abilify 5 mg a day she is seen by the psychiatric nurse practitioner.  2.  History of hypertension continues only on Lasix 3 times a week- this appears to be stable as noted above.  3.  History of type 2 diabetes which is diet controlled hemoglobin A1c was 6.1 in February will try to update this once the coronavirus  threat lessens we are trying to minimize labs so we can minimize visitors to facility    #4 history of hypothyroidism TSH is elevated at over 24 we have increased her Synthroid will need an update TSH in approximately a month.  5.  History of edema this appears to be stable- she is on Lasix 3 times a week  #6 history of hyperlipidemia she continues on Lipitor 40 mg a day LDL was 106 on lab done in early March-we have been somewhat conservative  secondary to her advanced age comorbidities- cholesterol panel actually showed risk is one half of average with VLDL of 12 triglycerides of 58 and HDL 49 total cholesterol 167.  --#7-history of thrombocytopenia and last lab in March showed relative stability at 129,000 will monitor periodically.    Of note will write an order to check on Tdap inoculation status as well as order a diabetic eye exam [phone]

## 2018-10-15 DIAGNOSIS — R1311 Dysphagia, oral phase: Secondary | ICD-10-CM | POA: Diagnosis not present

## 2018-10-15 DIAGNOSIS — F0280 Dementia in other diseases classified elsewhere without behavioral disturbance: Secondary | ICD-10-CM | POA: Diagnosis not present

## 2018-10-16 DIAGNOSIS — F0280 Dementia in other diseases classified elsewhere without behavioral disturbance: Secondary | ICD-10-CM | POA: Diagnosis not present

## 2018-10-16 DIAGNOSIS — R1311 Dysphagia, oral phase: Secondary | ICD-10-CM | POA: Diagnosis not present

## 2018-10-17 DIAGNOSIS — M6281 Muscle weakness (generalized): Secondary | ICD-10-CM | POA: Diagnosis not present

## 2018-10-17 DIAGNOSIS — R1311 Dysphagia, oral phase: Secondary | ICD-10-CM | POA: Diagnosis not present

## 2018-10-17 DIAGNOSIS — F0280 Dementia in other diseases classified elsewhere without behavioral disturbance: Secondary | ICD-10-CM | POA: Diagnosis not present

## 2018-10-20 DIAGNOSIS — F0280 Dementia in other diseases classified elsewhere without behavioral disturbance: Secondary | ICD-10-CM | POA: Diagnosis not present

## 2018-10-20 DIAGNOSIS — M6281 Muscle weakness (generalized): Secondary | ICD-10-CM | POA: Diagnosis not present

## 2018-10-20 DIAGNOSIS — R1311 Dysphagia, oral phase: Secondary | ICD-10-CM | POA: Diagnosis not present

## 2018-10-21 DIAGNOSIS — E039 Hypothyroidism, unspecified: Secondary | ICD-10-CM | POA: Diagnosis not present

## 2018-10-21 DIAGNOSIS — F0280 Dementia in other diseases classified elsewhere without behavioral disturbance: Secondary | ICD-10-CM | POA: Diagnosis not present

## 2018-10-21 DIAGNOSIS — R1311 Dysphagia, oral phase: Secondary | ICD-10-CM | POA: Diagnosis not present

## 2018-10-21 DIAGNOSIS — M6281 Muscle weakness (generalized): Secondary | ICD-10-CM | POA: Diagnosis not present

## 2018-10-21 LAB — TSH: TSH: 7.07 — AB (ref 0.41–5.90)

## 2018-10-22 DIAGNOSIS — R1311 Dysphagia, oral phase: Secondary | ICD-10-CM | POA: Diagnosis not present

## 2018-10-22 DIAGNOSIS — F0280 Dementia in other diseases classified elsewhere without behavioral disturbance: Secondary | ICD-10-CM | POA: Diagnosis not present

## 2018-10-22 DIAGNOSIS — M6281 Muscle weakness (generalized): Secondary | ICD-10-CM | POA: Diagnosis not present

## 2018-10-23 DIAGNOSIS — R1311 Dysphagia, oral phase: Secondary | ICD-10-CM | POA: Diagnosis not present

## 2018-10-23 DIAGNOSIS — M6281 Muscle weakness (generalized): Secondary | ICD-10-CM | POA: Diagnosis not present

## 2018-10-23 DIAGNOSIS — F0280 Dementia in other diseases classified elsewhere without behavioral disturbance: Secondary | ICD-10-CM | POA: Diagnosis not present

## 2018-10-24 DIAGNOSIS — F0280 Dementia in other diseases classified elsewhere without behavioral disturbance: Secondary | ICD-10-CM | POA: Diagnosis not present

## 2018-10-24 DIAGNOSIS — F39 Unspecified mood [affective] disorder: Secondary | ICD-10-CM | POA: Diagnosis not present

## 2018-10-24 DIAGNOSIS — M6281 Muscle weakness (generalized): Secondary | ICD-10-CM | POA: Diagnosis not present

## 2018-10-24 DIAGNOSIS — R1311 Dysphagia, oral phase: Secondary | ICD-10-CM | POA: Diagnosis not present

## 2018-10-24 DIAGNOSIS — F0281 Dementia in other diseases classified elsewhere with behavioral disturbance: Secondary | ICD-10-CM | POA: Diagnosis not present

## 2018-10-24 DIAGNOSIS — G308 Other Alzheimer's disease: Secondary | ICD-10-CM | POA: Diagnosis not present

## 2018-10-24 DIAGNOSIS — F339 Major depressive disorder, recurrent, unspecified: Secondary | ICD-10-CM | POA: Diagnosis not present

## 2018-10-27 DIAGNOSIS — F0280 Dementia in other diseases classified elsewhere without behavioral disturbance: Secondary | ICD-10-CM | POA: Diagnosis not present

## 2018-10-27 DIAGNOSIS — R1311 Dysphagia, oral phase: Secondary | ICD-10-CM | POA: Diagnosis not present

## 2018-10-27 DIAGNOSIS — M6281 Muscle weakness (generalized): Secondary | ICD-10-CM | POA: Diagnosis not present

## 2018-10-28 DIAGNOSIS — M6281 Muscle weakness (generalized): Secondary | ICD-10-CM | POA: Diagnosis not present

## 2018-10-28 DIAGNOSIS — F0280 Dementia in other diseases classified elsewhere without behavioral disturbance: Secondary | ICD-10-CM | POA: Diagnosis not present

## 2018-10-28 DIAGNOSIS — R1311 Dysphagia, oral phase: Secondary | ICD-10-CM | POA: Diagnosis not present

## 2018-10-29 DIAGNOSIS — F0280 Dementia in other diseases classified elsewhere without behavioral disturbance: Secondary | ICD-10-CM | POA: Diagnosis not present

## 2018-10-29 DIAGNOSIS — M6281 Muscle weakness (generalized): Secondary | ICD-10-CM | POA: Diagnosis not present

## 2018-10-29 DIAGNOSIS — R1311 Dysphagia, oral phase: Secondary | ICD-10-CM | POA: Diagnosis not present

## 2018-10-30 DIAGNOSIS — M6281 Muscle weakness (generalized): Secondary | ICD-10-CM | POA: Diagnosis not present

## 2018-10-30 DIAGNOSIS — F0280 Dementia in other diseases classified elsewhere without behavioral disturbance: Secondary | ICD-10-CM | POA: Diagnosis not present

## 2018-10-30 DIAGNOSIS — R1311 Dysphagia, oral phase: Secondary | ICD-10-CM | POA: Diagnosis not present

## 2018-10-31 DIAGNOSIS — F0280 Dementia in other diseases classified elsewhere without behavioral disturbance: Secondary | ICD-10-CM | POA: Diagnosis not present

## 2018-10-31 DIAGNOSIS — M6281 Muscle weakness (generalized): Secondary | ICD-10-CM | POA: Diagnosis not present

## 2018-10-31 DIAGNOSIS — R1311 Dysphagia, oral phase: Secondary | ICD-10-CM | POA: Diagnosis not present

## 2018-11-01 DIAGNOSIS — D649 Anemia, unspecified: Secondary | ICD-10-CM | POA: Diagnosis not present

## 2018-11-01 DIAGNOSIS — I129 Hypertensive chronic kidney disease with stage 1 through stage 4 chronic kidney disease, or unspecified chronic kidney disease: Secondary | ICD-10-CM | POA: Diagnosis not present

## 2018-11-01 LAB — BASIC METABOLIC PANEL
BUN: 16 (ref 4–21)
Creatinine: 0.9 (ref 0.5–1.1)
Glucose: 71
Potassium: 4.1 (ref 3.4–5.3)
Sodium: 143 (ref 137–147)

## 2018-11-01 LAB — CBC AND DIFFERENTIAL
HCT: 40 (ref 36–46)
Hemoglobin: 12.9 (ref 12.0–16.0)
Neutrophils Absolute: 1
Platelets: 162 (ref 150–399)
WBC: 4.8

## 2018-11-03 DIAGNOSIS — M6281 Muscle weakness (generalized): Secondary | ICD-10-CM | POA: Diagnosis not present

## 2018-11-03 DIAGNOSIS — F0280 Dementia in other diseases classified elsewhere without behavioral disturbance: Secondary | ICD-10-CM | POA: Diagnosis not present

## 2018-11-03 DIAGNOSIS — R1311 Dysphagia, oral phase: Secondary | ICD-10-CM | POA: Diagnosis not present

## 2018-11-04 ENCOUNTER — Encounter: Payer: Self-pay | Admitting: Internal Medicine

## 2018-11-04 DIAGNOSIS — F0280 Dementia in other diseases classified elsewhere without behavioral disturbance: Secondary | ICD-10-CM | POA: Diagnosis not present

## 2018-11-04 DIAGNOSIS — N390 Urinary tract infection, site not specified: Secondary | ICD-10-CM | POA: Diagnosis not present

## 2018-11-04 DIAGNOSIS — M6281 Muscle weakness (generalized): Secondary | ICD-10-CM | POA: Diagnosis not present

## 2018-11-04 DIAGNOSIS — R1311 Dysphagia, oral phase: Secondary | ICD-10-CM | POA: Diagnosis not present

## 2018-11-04 DIAGNOSIS — Z79899 Other long term (current) drug therapy: Secondary | ICD-10-CM | POA: Diagnosis not present

## 2018-11-04 DIAGNOSIS — R319 Hematuria, unspecified: Secondary | ICD-10-CM | POA: Diagnosis not present

## 2018-11-04 DIAGNOSIS — N183 Chronic kidney disease, stage 3 (moderate): Secondary | ICD-10-CM | POA: Diagnosis not present

## 2018-11-05 ENCOUNTER — Encounter: Payer: Self-pay | Admitting: Internal Medicine

## 2018-11-05 ENCOUNTER — Non-Acute Institutional Stay (SKILLED_NURSING_FACILITY): Payer: Medicare Other | Admitting: Internal Medicine

## 2018-11-05 DIAGNOSIS — F0280 Dementia in other diseases classified elsewhere without behavioral disturbance: Secondary | ICD-10-CM | POA: Diagnosis not present

## 2018-11-05 DIAGNOSIS — R1311 Dysphagia, oral phase: Secondary | ICD-10-CM | POA: Diagnosis not present

## 2018-11-05 DIAGNOSIS — N390 Urinary tract infection, site not specified: Secondary | ICD-10-CM | POA: Diagnosis not present

## 2018-11-05 DIAGNOSIS — M6281 Muscle weakness (generalized): Secondary | ICD-10-CM | POA: Diagnosis not present

## 2018-11-05 DIAGNOSIS — E038 Other specified hypothyroidism: Secondary | ICD-10-CM

## 2018-11-05 NOTE — Progress Notes (Signed)
  NURSING HOME LOCATION:  [facility] ROOM NUMBER:  214-B  CODE STATUS:  Full Code  PCP:  Pecola Lawless, MD  [address] [place] [address]  This was to bes a nursing facility follow up for chronic medical diagnoses; but the patient has an apparent UTI.  Interim medical record and care since last [facility] visit was updated with review of diagnostic studies and change in clinical status since last visit were documented.  HPI: She is a permanent resident of this facility with medical diagnoses of major depressive disorder, hypothyroidism, essential hypertension, dyslipidemia, uncomplicated diabetes, CKD, and dementia. Apparently over the weekend 5/15- 5/17, staff reported that she was more confused than usual.  Urinalysis was collected 5/19 which revealed 3+ leukocyte esterase, positive urine nitrates, 50-100 white cells and 3+ bacteria.  Final identification of the organism is pending but she has greater than 100,000 colonies of lactose fermenting gram-negative rods. TSH was 7.07 on 10/21/2018; significantly improved from a value of 24.56 in April. Review of systems: Dementia invalidated responses.  Patient is unable to give me the date.  In fact she could not give me her room number, her reply was "I don't have a room". She made the comment that "all symptoms comes up on me".  She states that she only "want to go to sleep"yet she then states that "I hate to go to sleep".  She also denied having a bed, but she was concerned that there were " too many beds in the room". She denies any depression.  She denied any constitutional or GU symptoms.  Physical exam:  Pertinent or positive findings: She exhibits a facies suggesting marked anxiety & even alarm with intermittent knitted brows. Intermittently she will open her eyes widely as it frightened.  Second heart sound is accentuated.  She is diffusely weak to opposition, especially the right lower extremity.  Pedal pulses are  decreased.Fine tremor, almost "pill rolling" intermittently of R hand.  General appearance: Adequately nourished; no acute distress, increased work of breathing is present.   Lymphatic: No lymphadenopathy about the head, neck, axilla. Eyes: No conjunctival inflammation or lid edema is present. There is no scleral icterus. Ears:  External ear exam shows no significant lesions or deformities.   Nose:  External nasal examination shows no deformity or inflammation. Nasal mucosa are pink and moist without lesions, exudates Oral exam:  Lips and gums are healthy appearing. There is no oropharyngeal erythema or exudate. Neck:  No thyromegaly, masses, tenderness noted.    Heart:  Normal rate and regular rhythm. S1 normal without gallop, murmur, click, rub .  Lungs: Chest clear to auscultation without wheezes, rhonchi, rales, rubs. Abdomen: Bowel sounds are normal. Abdomen is soft and nontender with no organomegaly, hernias, masses. GU: Deferred  Extremities:  No cyanosis, clubbing, edema  Neurologic exam :Balance, Rhomberg, finger to nose testing could not be completed due to clinical state Skin: Warm & dry w/o tenting. No significant lesions or rash.  See summary under each active problem in the Problem List with associated updated therapeutic plan

## 2018-11-06 ENCOUNTER — Non-Acute Institutional Stay (SKILLED_NURSING_FACILITY): Payer: Medicare Other | Admitting: Internal Medicine

## 2018-11-06 ENCOUNTER — Encounter: Payer: Self-pay | Admitting: Internal Medicine

## 2018-11-06 DIAGNOSIS — R1311 Dysphagia, oral phase: Secondary | ICD-10-CM | POA: Diagnosis not present

## 2018-11-06 DIAGNOSIS — M6281 Muscle weakness (generalized): Secondary | ICD-10-CM | POA: Diagnosis not present

## 2018-11-06 DIAGNOSIS — G309 Alzheimer's disease, unspecified: Secondary | ICD-10-CM

## 2018-11-06 DIAGNOSIS — F0281 Dementia in other diseases classified elsewhere with behavioral disturbance: Secondary | ICD-10-CM | POA: Diagnosis not present

## 2018-11-06 DIAGNOSIS — F0280 Dementia in other diseases classified elsewhere without behavioral disturbance: Secondary | ICD-10-CM | POA: Diagnosis not present

## 2018-11-06 DIAGNOSIS — N3000 Acute cystitis without hematuria: Secondary | ICD-10-CM | POA: Diagnosis not present

## 2018-11-06 NOTE — Patient Instructions (Signed)
See assessment and plan under each diagnosis in the problem list and acutely for this visit 

## 2018-11-06 NOTE — Assessment & Plan Note (Signed)
10/21/2018 TSH 7.07. No change in L thyroxine dose. Recheck in 8 weeks to verify stability

## 2018-11-06 NOTE — Progress Notes (Deleted)
Location:    [facility]  Nursing Home Room Number: 214/B Place of Service:  SNF [phone]) Provider:  Adrian Prows, MD  Patient Care Team: Pecola Lawless, MD as PCP - General (Internal Medicine)  Extended Emergency Contact Information Primary Emergency Contact: Robinson,Roberto Address: [address]          [place], [place] [address] [place] Home Phone: [phone] Relation: Son Secondary Emergency Contact: Reid,Cynthia  [place] Mobile Phone: [phone] Relation: Daughter  Code Status:  Full Code Goals of care: Advanced Directive information Advanced Directives 11/06/2018  Does Patient Have a Medical Advance Directive? Yes  Type of Advance Directive (No Data)  Does patient want to make changes to medical advance directive? No - Patient declined  Copy of Healthcare Power of Attorney in Chart? -  Would patient like information on creating a medical advance directive? -  Pre-existing out of facility DNR order (yellow form or pink MOST form) -     Chief Complaint  Patient presents with  . Acute Visit    F/U UTI    HPI:  Pt is a 84 y.o. female seen today for an acute visit for follow-up of a suspected UTI.  Patient is a long-term resident of facility with a history of dementia with depression-hypothyroidism hypertension dyslipidemia uncomplicated diabetes chronic kidney disease.  Apparently over the weekend she was more confused than usual and a urinalysis was collected which showed elevated leukocyte esterase and positive nitrites with 3+ bacteria.  The initial culture grew out greater than 100,000 colonies of gram-negative rods lactose fermenting- this has come back positive for E. coli today with sensitivities.  She was started on Macrobid empirically yesterday and it appears this E. coli strain is sensitive to [employer].  Currently she is lying in bed comfortably continues to be confused  but there is baseline confusion.  She appears to be fretting a bit says she is worried about her children according to her nurse this is not totally new behavior.  Vital signs appear to be stable at baseline.  Of note other labs were done over the weekend to which appeared to be fairly unremarkable   Past Medical History:  Diagnosis Date  . Chronic kidney disease    Stage III  . Dementia with behavioral disturbance (HCC) 03/19/2015   Notated on FL2 Form from Dr. Dreama Saa [phone]  . Depression    previously saw Dr Donell Beers  . Diabetes mellitus without complication (HCC)    11/18/15 A1c 6%  . Dyslipidemia    11/18/15 Tg 74, HDL 60, LDL 90 on statin  . HLD (hyperlipidemia)   . Hypertension   . Hypothyroidism    11/18/15 TSH 53.6, 02/07/16 TSH 9.42 @ [facility]  . MDD (major depressive disorder)    With behavioral disturbance  . Scabies    02/21/16 [facility],[place]; Rx: Permethrin cream  . Vitamin D deficiency    11/18/15 vitamin D 25-hydroxy 39   Past Surgical History:  Procedure Laterality Date  . no record     04/10/15 [facility] note: see old chart    No Known Allergies  Outpatient Encounter Medications as of 11/06/2018  Medication Sig  . acetaminophen (TYLENOL) 325 MG tablet Take 650 mg by mouth every 6 (six) hours as needed for mild pain or moderate pain.  . ARIPiprazole (ABILIFY) 5 MG tablet Take 5 mg by mouth daily.  Marland Kitchen aspirin EC 81 MG  tablet Take 81 mg by mouth daily.   Marland Kitchen atorvastatin (LIPITOR) 40 MG tablet Take 40 mg by mouth at bedtime.   . Calcium Carb-Cholecalciferol (CALCIUM-VITAMIN D3) 600-400 MG-UNIT TABS Take 1 tablet by mouth 2 (two) times daily.  . Cholecalciferol (VITAMIN D) 2000 units tablet Take 2,000 Units by mouth daily.   . divalproex (DEPAKOTE) 250 MG DR tablet Take 250 mg by mouth 2 (two) times daily.  Marland Kitchen donepezil (ARICEPT) 10 MG tablet Take 10 mg by mouth at bedtime.   . furosemide (LASIX) 20 MG tablet Take 20 mg by mouth 3 (three)  times a week. M-W-F  . levothyroxine (SYNTHROID) 175 MCG tablet Take 175 mcg by mouth daily before breakfast.  . memantine (NAMENDA) 10 MG tablet Take 10 mg by mouth 2 (two) times daily.   . Multiple Vitamin (MULTI-VITAMIN DAILY PO) Take one tablet by mouth once daily   . PARoxetine (PAXIL) 10 MG tablet Take 15 mg by mouth at bedtime. Take 1-1/2 tablets to = 15 mg qd  . sennosides-docusate sodium (SENOKOT-S) 8.6-50 MG tablet Take 2 tablets by mouth daily.   . Skin Protectants, Misc. (MINERIN) CREA Apply topically to ankles twice a day for dry skin.   No facility-administered encounter medications on file as of 11/06/2018.     Review of Systems   Is largely limited secondary to dementia but she is not really complaining of any stomach pain or dysuria currently does not complain of fever chills or pain  Immunization History  Administered Date(s) Administered  . Influenza-Unspecified 02/17/2016, 04/15/2017  . PPD Test 04/08/2016  . Pneumococcal Conjugate-13 01/11/2017  . Pneumococcal-Unspecified 02/16/2014   Pertinent  Health Maintenance Due  Topic Date Due  . OPHTHALMOLOGY EXAM  05/08/2019 (Originally 10/12/1944)  . INFLUENZA VACCINE  01/17/2019  . HEMOGLOBIN A1C  01/30/2019  . FOOT EXAM  06/10/2019  . PNA vac Low Risk Adult  Completed  . URINE MICROALBUMIN  Discontinued  . DEXA SCAN  Discontinued   Fall Risk  01/14/2018 01/10/2017 08/07/2016 04/20/2016  Falls in the past year? No No Yes Yes  Number falls in past yr: - - 2 or more 2 or more  Injury with Fall? - - - Yes  Risk Factor Category  - - - High Fall Risk   Functional Status Survey:    Vitals:   11/06/18 0925  BP: (!) 105/55  Pulse: (!) 53  Resp: 18  Temp: 98.3 F (36.8 C)  TempSrc: Oral  SpO2: 95%  Weight: 212 lb 6.4 oz (96.3 kg)  Height: 5\' 9"  (1.753 m)  Pulse on exam was 60 blood pressures recently ranging from low 100s up to 150s there systolically there is a lot of variability  Body mass index is 31.37 kg/m.  Physical Exam  In general this is a well-developed elderly female in no distress she does have somewhat of an anxious appearance but she does this at times per nursing  Her skin is warm and dry.  Eyes visual acuity appears to be intact sclera and conjunctive are clear.  Oropharynx appears clear mucous membranes moist.  Chest is clear to auscultation there is no labored breathing.  Heart is regular rate and rhythm at around 60- she has baseline mild lower extremity edema.  Abdomen is soft somewhat obese nontender with positive bowel sounds.  GU could not really appreciate significant suprapubic tenderness  Psych she is oriented to self will follow simple verbal commands does have somewhat of a anxious appearance with kneded eyebrows  as noted previously by Dr. Alwyn RenHopper  She is pleasant continues to intermittently open her eyes widely as if she is somewhat frightened.     Labs reviewed: Recent Labs    08/22/18 09/01/18 11/01/18  NA 147 145 143  K 3.9 4.1 4.1  BUN 15 15 16   CREATININE 0.8 0.8 0.9   Recent Labs    01/04/18 02/18/18 03/04/18  AST 18 21 31   ALT 9 11 15   ALKPHOS 69 80 80   Recent Labs    06/20/18 08/22/18 11/01/18  WBC 3.9 3.6 4.8  NEUTROABS 1 1 1   HGB 12.5 12.2 12.9  HCT 37 36 40  PLT 126* 129* 162   Lab Results  Component Value Date   TSH 7.07 (A) 10/21/2018   Lab Results  Component Value Date   HGBA1C 6.1 08/01/2018   Lab Results  Component Value Date   CHOL 167 08/22/2018   HDL 49 08/22/2018   LDLCALC 106 08/22/2018   TRIG 58 08/22/2018    Significant Diagnostic Results in last 30 days:  No results found.  Assessment/Plan #1 UTI-she has been started on Macrobid this appears to be effective against this strain of E. coli-at this point will monitor she continues on Macrobid for a total of 5 days.  She is afebrile per nursing appears to be relatively at her baseline- she does continue to have confusion at baseline.  Her other labs done  appear to be pretty reassuring  [phone]

## 2018-11-07 DIAGNOSIS — R1311 Dysphagia, oral phase: Secondary | ICD-10-CM | POA: Diagnosis not present

## 2018-11-07 DIAGNOSIS — M6281 Muscle weakness (generalized): Secondary | ICD-10-CM | POA: Diagnosis not present

## 2018-11-07 DIAGNOSIS — F0280 Dementia in other diseases classified elsewhere without behavioral disturbance: Secondary | ICD-10-CM | POA: Diagnosis not present

## 2018-11-10 DIAGNOSIS — F0280 Dementia in other diseases classified elsewhere without behavioral disturbance: Secondary | ICD-10-CM | POA: Diagnosis not present

## 2018-11-10 DIAGNOSIS — M6281 Muscle weakness (generalized): Secondary | ICD-10-CM | POA: Diagnosis not present

## 2018-11-10 DIAGNOSIS — R1311 Dysphagia, oral phase: Secondary | ICD-10-CM | POA: Diagnosis not present

## 2018-11-11 DIAGNOSIS — F0280 Dementia in other diseases classified elsewhere without behavioral disturbance: Secondary | ICD-10-CM | POA: Diagnosis not present

## 2018-11-11 DIAGNOSIS — R1311 Dysphagia, oral phase: Secondary | ICD-10-CM | POA: Diagnosis not present

## 2018-11-11 DIAGNOSIS — M6281 Muscle weakness (generalized): Secondary | ICD-10-CM | POA: Diagnosis not present

## 2018-11-12 DIAGNOSIS — M6281 Muscle weakness (generalized): Secondary | ICD-10-CM | POA: Diagnosis not present

## 2018-11-12 DIAGNOSIS — R1311 Dysphagia, oral phase: Secondary | ICD-10-CM | POA: Diagnosis not present

## 2018-11-12 DIAGNOSIS — F0280 Dementia in other diseases classified elsewhere without behavioral disturbance: Secondary | ICD-10-CM | POA: Diagnosis not present

## 2018-11-13 DIAGNOSIS — E119 Type 2 diabetes mellitus without complications: Secondary | ICD-10-CM | POA: Diagnosis not present

## 2018-11-13 DIAGNOSIS — E039 Hypothyroidism, unspecified: Secondary | ICD-10-CM | POA: Diagnosis not present

## 2018-11-13 DIAGNOSIS — F0280 Dementia in other diseases classified elsewhere without behavioral disturbance: Secondary | ICD-10-CM | POA: Diagnosis not present

## 2018-11-13 DIAGNOSIS — I129 Hypertensive chronic kidney disease with stage 1 through stage 4 chronic kidney disease, or unspecified chronic kidney disease: Secondary | ICD-10-CM | POA: Diagnosis not present

## 2018-11-13 DIAGNOSIS — M6281 Muscle weakness (generalized): Secondary | ICD-10-CM | POA: Diagnosis not present

## 2018-11-13 DIAGNOSIS — R1311 Dysphagia, oral phase: Secondary | ICD-10-CM | POA: Diagnosis not present

## 2018-11-13 LAB — TSH: TSH: 7.78 — AB (ref 0.41–5.90)

## 2018-11-13 LAB — HEMOGLOBIN A1C: Hemoglobin A1C: 6.2

## 2018-11-14 DIAGNOSIS — M6281 Muscle weakness (generalized): Secondary | ICD-10-CM | POA: Diagnosis not present

## 2018-11-14 DIAGNOSIS — R1311 Dysphagia, oral phase: Secondary | ICD-10-CM | POA: Diagnosis not present

## 2018-11-14 DIAGNOSIS — F0280 Dementia in other diseases classified elsewhere without behavioral disturbance: Secondary | ICD-10-CM | POA: Diagnosis not present

## 2018-11-14 NOTE — Progress Notes (Signed)
Location:    [facility] Room Number: 214/B Place of Service:  SNF [phone]) Provider:  Adrian Prows, MD  Patient Care Team: Pecola Lawless, MD as PCP - General (Internal Medicine)  Extended Emergency Contact Information Primary Emergency Contact: Robinson,Roberto Address: [address]          [place], [place] [address] [place] Home Phone: [phone] Relation: Son Secondary Emergency Contact: Reid,Cynthia  [place] Mobile Phone: [phone] Relation: Daughter  Code Status:  Full Code Goals of care: Advanced Directive information Advanced Directives 11/06/2018  Does Patient Have a Medical Advance Directive? Yes  Type of Advance Directive (No Data)  Does patient want to make changes to medical advance directive? No - Patient declined  Copy of Healthcare Power of Attorney in Chart? -  Would patient like information on creating a medical advance directive? -  Pre-existing out of facility DNR order (yellow form or pink MOST form) -     Chief Complaint  Patient presents with  . Acute Visit    F/U UTI    HPI:  Pt is a 84 y.o. female seen today for an acute visit for follow-up of a suspected UTI.  Patient is a long-term resident of facility with a history of dementia with depression-hypothyroidism hypertension dyslipidemia uncomplicated diabetes chronic kidney disease.  Apparently over the weekend she was more confused than usual and a urinalysis was collected which showed elevated leukocyte esterase and positive nitrites with 3+ bacteria.  The initial culture grew out greater than 100,000 colonies of gram-negative rods lactose fermenting- this has come back positive for E. coli today with sensitivities.  She was started on Macrobid empirically yesterday and it appears this E. coli strain is sensitive to [employer].  Currently she is lying in bed comfortably continues to be confused  but there is baseline confusion.  She appears to be fretting a bit says she is worried about her children according to her nurse this is not totally new behavior.  Vital signs appear to be stable at baseline.  Of note other labs were done over the weekend to which appeared to be fairly unremarkable.  A TSH is mildly elevated but actually is t significantly improved from April lab   Past Medical History:  Diagnosis Date  . Chronic kidney disease    Stage III  . Dementia with behavioral disturbance (HCC) 03/19/2015   Notated on FL2 Form from Dr. Dreama Saa [phone]  . Depression    previously saw Dr Donell Beers  . Diabetes mellitus without complication (HCC)    11/18/15 A1c 6%  . Dyslipidemia    11/18/15 Tg 74, HDL 60, LDL 90 on statin  . HLD (hyperlipidemia)   . Hypertension   . Hypothyroidism    11/18/15 TSH 53.6, 02/07/16 TSH 9.42 @ [facility]  . MDD (major depressive disorder)    With behavioral disturbance  . Scabies    02/21/16 [facility],[place]; Rx: Permethrin cream  . Vitamin D deficiency    11/18/15 vitamin D 25-hydroxy 39   Past Surgical History:  Procedure Laterality Date  . no record     04/10/15 [facility] note: see old chart    No Known Allergies  Outpatient Encounter Medications as of 11/06/2018  Medication Sig  . acetaminophen (TYLENOL) 325 MG tablet Take 650 mg by mouth every 6 (six) hours as needed for mild pain or moderate pain.  . ARIPiprazole (ABILIFY)  5 MG tablet Take 5 mg by mouth daily.  Marland Kitchen. aspirin EC 81 MG tablet Take 81 mg by mouth daily.   Marland Kitchen. atorvastatin (LIPITOR) 40 MG tablet Take 40 mg by mouth at bedtime.   . Calcium Carb-Cholecalciferol (CALCIUM-VITAMIN D3) 600-400 MG-UNIT TABS Take 1 tablet by mouth 2 (two) times daily.  . Cholecalciferol (VITAMIN D) 2000 units tablet Take 2,000 Units by mouth daily.   . divalproex (DEPAKOTE) 250 MG DR tablet Take 250 mg by mouth 2 (two) times daily.  Marland Kitchen. donepezil (ARICEPT) 10 MG tablet Take 10 mg by mouth  at bedtime.   . furosemide (LASIX) 20 MG tablet Take 20 mg by mouth 3 (three) times a week. M-W-F  . levothyroxine (SYNTHROID) 175 MCG tablet Take 175 mcg by mouth daily before breakfast.  . memantine (NAMENDA) 10 MG tablet Take 10 mg by mouth 2 (two) times daily.   . Multiple Vitamin (MULTI-VITAMIN DAILY PO) Take one tablet by mouth once daily   . PARoxetine (PAXIL) 10 MG tablet Take 15 mg by mouth at bedtime. Take 1-1/2 tablets to = 15 mg qd  . sennosides-docusate sodium (SENOKOT-S) 8.6-50 MG tablet Take 2 tablets by mouth daily.   . Skin Protectants, Misc. (MINERIN) CREA Apply topically to ankles twice a day for dry skin.   No facility-administered encounter medications on file as of 11/06/2018.     Review of Systems   Is largely limited secondary to dementia but she is not really complaining of any stomach pain or dysuria currently does not complain of fever chills or pain  Immunization History  Administered Date(s) Administered  . Influenza-Unspecified 02/17/2016, 04/15/2017  . PPD Test 04/08/2016  . Pneumococcal Conjugate-13 01/11/2017  . Pneumococcal-Unspecified 02/16/2014   Pertinent  Health Maintenance Due  Topic Date Due  . OPHTHALMOLOGY EXAM  05/08/2019 (Originally 10/12/1944)  . INFLUENZA VACCINE  01/17/2019  . HEMOGLOBIN A1C  01/30/2019  . FOOT EXAM  06/10/2019  . PNA vac Low Risk Adult  Completed  . URINE MICROALBUMIN  Discontinued  . DEXA SCAN  Discontinued   Fall Risk  01/14/2018 01/10/2017 08/07/2016 04/20/2016  Falls in the past year? No No Yes Yes  Number falls in past yr: - - 2 or more 2 or more  Injury with Fall? - - - Yes  Risk Factor Category  - - - High Fall Risk   Functional Status Survey:    Vitals:   11/06/18 0925  BP: (!) 105/55  Pulse: (!) 53  Resp: 18  Temp: 98.3 F (36.8 C)  TempSrc: Oral  SpO2: 95%  Weight: 212 lb 6.4 oz (96.3 kg)  Height: 5\' 9"  (1.753 m)  Pulse on exam was 60 blood pressures recently ranging from low 100s up to 150s  there systolically there is a lot of variability  Body mass index is 31.37 kg/m. Physical Exam  In general this is a well-developed elderly female in no distress she does have somewhat of an anxious appearance but she does this at times per nursing  Her skin is warm and dry.  Eyes visual acuity appears to be intact sclera and conjunctive are clear.  Oropharynx appears clear mucous membranes moist.  Chest is clear to auscultation there is no labored breathing.  Heart is regular rate and rhythm at around 60- she has baseline mild lower extremity edema.  Abdomen is soft somewhat obese nontender with positive bowel sounds.  GU could not really appreciate significant suprapubic tenderness  Psych she is oriented to self  will follow simple verbal commands does have somewhat of a anxious appearance with kneded eyebrows as noted previously by Dr. Alwyn Ren  She is pleasant continues to intermittently open her eyes widely as if she is somewhat frightened.     Labs reviewed: Recent Labs    08/22/18 09/01/18 11/01/18  NA 147 145 143  K 3.9 4.1 4.1  BUN 15 15 16   CREATININE 0.8 0.8 0.9   Recent Labs    01/04/18 02/18/18 03/04/18  AST 18 21 31   ALT 9 11 15   ALKPHOS 69 80 80   Recent Labs    06/20/18 08/22/18 11/01/18  WBC 3.9 3.6 4.8  NEUTROABS 1 1 1   HGB 12.5 12.2 12.9  HCT 37 36 40  PLT 126* 129* 162   Lab Results  Component Value Date   TSH 7.07 (A) 10/21/2018   Lab Results  Component Value Date   HGBA1C 6.1 08/01/2018   Lab Results  Component Value Date   CHOL 167 08/22/2018   HDL 49 08/22/2018   LDLCALC 106 08/22/2018   TRIG 58 08/22/2018    Significant Diagnostic Results in last 30 days:  No results found.  Assessment/Plan #1 UTI-she has been started on Macrobid this appears to be effective against this strain of E. coli-at this point will monitor she continues on Macrobid for a total of 5 days.  She is afebrile per nursing appears to be relatively at her  baseline- she does continue to have confusion at baseline.  Her other labs done appear to be pretty reassuring  #2 dementia she does continue on Aricept and Namenda as well as Depakote for behaviors in addition to Abilify she has been followed by psychiatric services  [phone]

## 2018-11-17 ENCOUNTER — Non-Acute Institutional Stay (SKILLED_NURSING_FACILITY): Payer: Medicare Other | Admitting: Internal Medicine

## 2018-11-17 ENCOUNTER — Encounter: Payer: Self-pay | Admitting: Internal Medicine

## 2018-11-17 DIAGNOSIS — L02611 Cutaneous abscess of right foot: Secondary | ICD-10-CM

## 2018-11-17 DIAGNOSIS — E118 Type 2 diabetes mellitus with unspecified complications: Secondary | ICD-10-CM

## 2018-11-17 DIAGNOSIS — F0280 Dementia in other diseases classified elsewhere without behavioral disturbance: Secondary | ICD-10-CM | POA: Diagnosis not present

## 2018-11-17 DIAGNOSIS — Z87440 Personal history of urinary (tract) infections: Secondary | ICD-10-CM

## 2018-11-17 DIAGNOSIS — L03031 Cellulitis of right toe: Secondary | ICD-10-CM

## 2018-11-17 DIAGNOSIS — R1311 Dysphagia, oral phase: Secondary | ICD-10-CM | POA: Diagnosis not present

## 2018-11-17 DIAGNOSIS — M6281 Muscle weakness (generalized): Secondary | ICD-10-CM | POA: Diagnosis not present

## 2018-11-17 NOTE — Progress Notes (Signed)
Location:    [facility] Room Number: 214/B Place of Service:  SNF [phone]) Provider:  Adrian Prows, MD  Patient Care Team: Pecola Lawless, MD as PCP - General (Internal Medicine)  Extended Emergency Contact Information Primary Emergency Contact: Robinson,Roberto Address: [address]          [place], [place] [address] [place] Home Phone: [phone] Relation: Son Secondary Emergency Contact: Reid,Cynthia  [place] Mobile Phone: [phone] Relation: Daughter  Code Status:  Full Code Goals of care: Advanced Directive information Advanced Directives 11/17/2018  Does Patient Have a Medical Advance Directive? Yes  Type of Advance Directive (No Data)  Does patient want to make changes to medical advance directive? No - Patient declined  Copy of Healthcare Power of Attorney in Chart? -  Would patient like information on creating a medical advance directive? -  Pre-existing out of facility DNR order (yellow form or pink MOST form) -     Chief Complaint  Patient presents with  . Acute Visit    Right great toe issues    HPI:  Pt is a 84 y.o. female seen today for an acute visit for a darkened and tender area under her right great toe.  She is a long-term resident of facility with a history of dementia with depression as well as hypothyroidism hypertension dyslipidemia uncomplicated type 2 diabetes and chronic kidney disease.  She recently completed antibiotic for an E. coli UTI-she does have baseline confusion but apparently this is back more at her baseline since treatment for the UTI.  She has been afebrile she does not really complain of any discomfort today but nursing staff over the weekend noted a somewhat tender darkened area under the nailbed of her right great toe.  She is a poor historian secondary to dementia but per staff appears to be at her baseline and when the toe is  palpated she says it hurts a bit but that is really her only complaint does not complain of pain when is not palpated   Past Medical History:  Diagnosis Date  . Chronic kidney disease    Stage III  . Dementia with behavioral disturbance (HCC) 03/19/2015   Notated on FL2 Form from Dr. Dreama Saa [phone]  . Depression    previously saw Dr Donell Beers  . Diabetes mellitus without complication (HCC)    11/18/15 A1c 6%  . Dyslipidemia    11/18/15 Tg 74, HDL 60, LDL 90 on statin  . HLD (hyperlipidemia)   . Hypertension   . Hypothyroidism    11/18/15 TSH 53.6, 02/07/16 TSH 9.42 @ [facility]  . MDD (major depressive disorder)    With behavioral disturbance  . Scabies    02/21/16 [facility],[place]; Rx: Permethrin cream  . Vitamin D deficiency    11/18/15 vitamin D 25-hydroxy 39   Past Surgical History:  Procedure Laterality Date  . no record     04/10/15 [facility] note: see old chart    No Known Allergies  Outpatient Encounter Medications as of 11/17/2018  Medication Sig  . acetaminophen (TYLENOL) 325 MG tablet Take 650 mg by mouth every 6 (six) hours as needed for mild pain or moderate pain.  . ARIPiprazole (ABILIFY) 5 MG tablet Take 5 mg by mouth daily.  Marland Kitchen aspirin EC 81 MG tablet Take 81 mg by mouth daily.   Marland Kitchen atorvastatin (LIPITOR) 40 MG tablet Take 40 mg  by mouth at bedtime.   . Calcium Carb-Cholecalciferol (CALCIUM-VITAMIN D3) 600-400 MG-UNIT TABS Take 1 tablet by mouth 2 (two) times daily.  . Cholecalciferol (VITAMIN D) 2000 units tablet Take 2,000 Units by mouth daily.   . divalproex (DEPAKOTE) 250 MG DR tablet Take 250 mg by mouth 2 (two) times daily.  Marland Kitchen. donepezil (ARICEPT) 10 MG tablet Take 10 mg by mouth at bedtime.   . furosemide (LASIX) 20 MG tablet Take 20 mg by mouth 3 (three) times a week. M-W-F  . levothyroxine (SYNTHROID) 175 MCG tablet Take 175 mcg by mouth daily before breakfast.  . memantine (NAMENDA) 10 MG tablet Take 10 mg by mouth 2 (two) times daily.    . Multiple Vitamin (MULTI-VITAMIN DAILY PO) Take one tablet by mouth once daily   . PARoxetine (PAXIL) 10 MG tablet Take 15 mg by mouth at bedtime. Take 1-1/2 tablets to = 15 mg qd  . sennosides-docusate sodium (SENOKOT-S) 8.6-50 MG tablet Take 2 tablets by mouth daily.   . Skin Protectants, Misc. (MINERIN) CREA Apply topically to ankles twice a day for dry skin.   No facility-administered encounter medications on file as of 11/17/2018.     Review of Systems   This is limited secondary to dementia please see HPI she does not complain of any shortness of breath or pain or musculoskeletal pain has some tenderness when her right great toe distal area is palpated  Immunization History  Administered Date(s) Administered  . Influenza-Unspecified 02/17/2016, 04/15/2017  . PPD Test 04/08/2016  . Pneumococcal Conjugate-13 01/11/2017  . Pneumococcal-Unspecified 02/16/2014   Pertinent  Health Maintenance Due  Topic Date Due  . OPHTHALMOLOGY EXAM  05/08/2019 (Originally 10/12/1944)  . INFLUENZA VACCINE  01/17/2019  . HEMOGLOBIN A1C  01/30/2019  . FOOT EXAM  06/10/2019  . PNA vac Low Risk Adult  Completed  . URINE MICROALBUMIN  Discontinued  . DEXA SCAN  Discontinued   Fall Risk  01/14/2018 01/10/2017 08/07/2016 04/20/2016  Falls in the past year? No No Yes Yes  Number falls in past yr: - - 2 or more 2 or more  Injury with Fall? - - - Yes  Risk Factor Category  - - - High Fall Risk   Functional Status Survey:    Vitals:   11/17/18 1146  BP: (!) 112/55  Pulse: (!) 57  Resp: 18  Temp: 98.2 F (36.8 C)  TempSrc: Oral  SpO2: 95%   There is no height or weight on file to calculate BMI. Physical Exam In general this is a pleasant elderly female lying no distress she does have somewhat of a nervous apprehensive look but this is not new.  Her skin is warm and dry.  Eyes visual acuity appears to be intact sclera and conjunctive are clear.  Oropharynx is clear mucous membranes moist.   Chest is clear to auscultation there is no labored breathing.  Abdomen is obese soft nontender with positive bowel sounds.  Musculoskeletal Limited exam since he is in bed but appears able to move her extremities at baseline with lower extremity weakness at baseline.  Base inferior  to the right great toenail there is a small darkened area possibly a small amount of dried blood although this is difficult to fully ascertain there is some tenderness to palpation right under the nailbed- I could not really appreciate any erythema that extends down the toe or behind the toe   Neurologic she is alert not really appreciate lateralizing findings.  Psych she is  oriented to self she does follow simple verbal commands continues to have somewhat of an apprehensive look on her face Labs reviewed: Recent Labs    08/22/18 09/01/18 11/01/18  NA 147 145 143  K 3.9 4.1 4.1  BUN 15 15 16   CREATININE 0.8 0.8 0.9   Recent Labs    01/04/18 02/18/18 03/04/18  AST 18 21 31   ALT 9 11 15   ALKPHOS 69 80 80   Recent Labs    06/20/18 08/22/18 11/01/18  WBC 3.9 3.6 4.8  NEUTROABS 1 1 1   HGB 12.5 12.2 12.9  HCT 37 36 40  PLT 126* 129* 162   Lab Results  Component Value Date   TSH 7.07 (A) 10/21/2018   Lab Results  Component Value Date   HGBA1C 6.1 08/01/2018   Lab Results  Component Value Date   CHOL 167 08/22/2018   HDL 49 08/22/2018   LDLCALC 106 08/22/2018   TRIG 58 08/22/2018    Significant Diagnostic Results in last 30 days:  No results found.  Assessment/Plan #1 right great toe issues- she is a diabetic so will be aggressive here and start a an antibiotic for short period doxycycline 100 mg twice daily concerns of any developing cellulitis.  Also will have wound care to assess this and treat accordingly.  Will add a probiotic twice daily for 10 days.  She would benefit from a podiatry consult when coronavirus quarantine ends.  2.  History of diabetes which appears to be  diet-controlled her CBGs appear to run largely in the low 100s.  Hemoglobin A1c was 6.2 on lab done last month.  3.  History of UTI she has been treated with Macrobid she does not appear to be overtly symptomatic she has been afebrile at this point will monitor   [id number]   Edmon Crape PA-C  [phone]

## 2018-11-18 ENCOUNTER — Encounter: Payer: Self-pay | Admitting: Adult Health

## 2018-11-18 ENCOUNTER — Non-Acute Institutional Stay (SKILLED_NURSING_FACILITY): Payer: Medicare Other | Admitting: Adult Health

## 2018-11-18 DIAGNOSIS — R1311 Dysphagia, oral phase: Secondary | ICD-10-CM | POA: Diagnosis not present

## 2018-11-18 DIAGNOSIS — L03031 Cellulitis of right toe: Secondary | ICD-10-CM

## 2018-11-18 DIAGNOSIS — E038 Other specified hypothyroidism: Secondary | ICD-10-CM

## 2018-11-18 DIAGNOSIS — F0280 Dementia in other diseases classified elsewhere without behavioral disturbance: Secondary | ICD-10-CM | POA: Diagnosis not present

## 2018-11-18 DIAGNOSIS — M6281 Muscle weakness (generalized): Secondary | ICD-10-CM | POA: Diagnosis not present

## 2018-11-18 NOTE — Progress Notes (Addendum)
Location:  [facility] Room Number: 214/B Place of Service:  SNF (31) Provider:  Kenard Gower, DNP, FNP-BC  Patient Care Team: Pecola Lawless, MD as PCP - General (Internal Medicine)  Extended Emergency Contact Information Primary Emergency Contact: Robinson,Roberto Address: [address]          [place], [place] [address] [place] Home Phone: [phone] Relation: Son Secondary Emergency Contact: Reid,Cynthia  [place] Mobile Phone: [phone] Relation: Daughter  Code Status:  Full Code  Goals of care: Advanced Directive information Advanced Directives 11/18/2018  Does Patient Have a Medical Advance Directive? Yes  Type of Advance Directive (No Data)  Does patient want to make changes to medical advance directive? No - Patient declined  Copy of Healthcare Power of Attorney in Chart? -  Would patient like information on creating a medical advance directive? -  Pre-existing out of facility DNR order (yellow form or pink MOST form) -     Chief Complaint  Patient presents with  . Acute Visit    Elevated TSH    HPI:  Pt is a 84 y.o. female seen today for an acute visit. Latest tsh 7.78 and hgbA1c 6.2. She is currently taking levothyroxine for hypothyroidism. She was recently started started on Doxycycline for right great toe cellulitis. Noted to have small amount of serosanguinous drainage on her right great toe. Area is tender to touch.  Past Medical History:  Diagnosis Date  . Chronic kidney disease    Stage III  . Dementia with behavioral disturbance (HCC) 03/19/2015   Notated on FL2 Form from Dr. Dreama Saa [phone]  . Depression    previously saw Dr Donell Beers  . Diabetes mellitus without complication (HCC)    11/18/15 A1c 6%  . Dyslipidemia    11/18/15 Tg 74, HDL 60, LDL 90 on statin  . HLD (hyperlipidemia)   . Hypertension   . Hypothyroidism    11/18/15 TSH 53.6, 02/07/16 TSH 9.42 @ [facility]   . MDD (major depressive disorder)    With behavioral disturbance  . Scabies    02/21/16 [facility],[place]; Rx: Permethrin cream  . Vitamin D deficiency    11/18/15 vitamin D 25-hydroxy 39   Past Surgical History:  Procedure Laterality Date  . no record     04/10/15 [facility] note: see old chart    No Known Allergies  Outpatient Encounter Medications as of 11/18/2018  Medication Sig  . acetaminophen (TYLENOL) 325 MG tablet Take 650 mg by mouth every 6 (six) hours as needed for mild pain or moderate pain.  . ARIPiprazole (ABILIFY) 5 MG tablet Take 5 mg by mouth daily.  Marland Kitchen aspirin EC 81 MG tablet Take 81 mg by mouth daily.   Marland Kitchen atorvastatin (LIPITOR) 40 MG tablet Take 40 mg by mouth at bedtime.   . Calcium Carb-Cholecalciferol (CALCIUM-VITAMIN D3) 600-400 MG-UNIT TABS Take 1 tablet by mouth 2 (two) times daily.  . Cholecalciferol (VITAMIN D) 2000 units tablet Take 2,000 Units by mouth daily.   . divalproex (DEPAKOTE) 250 MG DR tablet Take 250 mg by mouth 2 (two) times daily.  Marland Kitchen donepezil (ARICEPT) 10 MG tablet Take 10 mg by mouth at bedtime.   . furosemide (LASIX) 20 MG tablet Take 20 mg by mouth 3 (three) times a week. M-W-F  . levothyroxine (SYNTHROID) 175 MCG tablet Take 175 mcg by mouth daily before breakfast.  . memantine (NAMENDA) 10 MG tablet Take 10 mg by mouth 2 (two)  times daily.   . Multiple Vitamin (MULTI-VITAMIN DAILY PO) Take one tablet by mouth once daily   . PARoxetine (PAXIL) 10 MG tablet Take 15 mg by mouth at bedtime. Take 1-1/2 tablets to = 15 mg qd  . sennosides-docusate sodium (SENOKOT-S) 8.6-50 MG tablet Take 2 tablets by mouth daily.   . Skin Protectants, Misc. (MINERIN) CREA Apply topically to ankles twice a day for dry skin.   No facility-administered encounter medications on file as of 11/18/2018.     Review of Systems  GENERAL: No change in appetite, no fatigue, no weight changes, no fever, chills or weakness MOUTH and THROAT: Denies oral discomfort,  gingival pain or bleeding RESPIRATORY: no cough, SOB, DOE, wheezing, hemoptysis CARDIAC: No chest pain, or palpitations GI: No abdominal pain, diarrhea, constipation, heart burn, nausea or vomiting GU: Denies dysuria, frequency, hematuria, incontinence, or discharge NEUROLOGICAL: Denies dizziness, syncope, numbness, or headache PSYCHIATRIC: Denies feelings of depression or anxiety. No report of hallucinations, insomnia, paranoia, or agitation   Immunization History  Administered Date(s) Administered  . Influenza-Unspecified 02/17/2016, 04/15/2017  . PPD Test 04/08/2016  . Pneumococcal Conjugate-13 01/11/2017  . Pneumococcal-Unspecified 02/16/2014   Pertinent  Health Maintenance Due  Topic Date Due  . OPHTHALMOLOGY EXAM  05/08/2019 (Originally 10/12/1944)  . INFLUENZA VACCINE  01/17/2019  . HEMOGLOBIN A1C  01/30/2019  . FOOT EXAM  06/10/2019  . PNA vac Low Risk Adult  Completed  . URINE MICROALBUMIN  Discontinued  . DEXA SCAN  Discontinued   Fall Risk  01/14/2018 01/10/2017 08/07/2016 04/20/2016  Falls in the past year? No No Yes Yes  Number falls in past yr: - - 2 or more 2 or more  Injury with Fall? - - - Yes  Risk Factor Category  - - - High Fall Risk     Vitals:   11/18/18 1029  BP: (!) 128/51  Pulse: (!) 57  Resp: 18  Temp: 98.7 F (37.1 C)  TempSrc: Oral  SpO2: 95%  Weight: 212 lb 6.4 oz (96.3 kg)  Height: 5\' 9"  (1.753 m)   Body mass index is 31.37 kg/m.  Physical Exam  GENERAL APPEARANCE: Well nourished. In no acute distress. Obese SKIN:  Right great toe edematous, 1+ edema with serosanguinous drainage MOUTH and THROAT: Lips are without lesions. Oral mucosa is moist and without lesions.  RESPIRATORY: Breathing is even & unlabored, BS CTAB CARDIAC: RRR, no murmur,no extra heart sounds, right great toe 1+edema with serosanguinous drainage. GI: Abdomen soft, normal BS, no masses, no tenderness EXTREMITIES:  Able to move X 4 extremities NEUROLOGICAL: There is no  tremor. Speech is clear. Alert to self, disoriented to time and place. PSYCHIATRIC:  Affect and behavior are appropriate   Labs reviewed: Recent Labs    08/22/18 09/01/18 11/01/18  NA 147 145 143  K 3.9 4.1 4.1  BUN 15 15 16   CREATININE 0.8 0.8 0.9   Recent Labs    01/04/18 02/18/18 03/04/18  AST 18 21 31   ALT 9 11 15   ALKPHOS 69 80 80   Recent Labs    06/20/18 08/22/18 11/01/18  WBC 3.9 3.6 4.8  NEUTROABS 1 1 1   HGB 12.5 12.2 12.9  HCT 37 36 40  PLT 126* 129* 162   Lab Results  Component Value Date   TSH 7.07 (A) 10/21/2018   Lab Results  Component Value Date   HGBA1C 6.1 08/01/2018   Lab Results  Component Value Date   CHOL 167 08/22/2018   HDL 49  08/22/2018   LDLCALC 106 08/22/2018   TRIG 58 08/22/2018    Assessment/Plan  1. Other specified hypothyroidism Lab Results  Component Value Date   TSH 7.07 (A) 10/21/2018  - tsh elevated, will increase Levothyroxine from 175 mcg to 200 mcg daily and re-check tsh in 6 weeks   2. Cellulitis of great toe of right foot - will start daily treatment by cleaning with NS, applying silver alginate and cover with dry dressing    Family/ staff Communication:  Discussed plan of care with resident and charge nurse.  Labs/tests ordered:  tsh in 6 weeks  Goals of care:   Long-term care   Kenard Gower, DNP, FNP-BC [facility] [phone] (Monday-Friday 8:00 a.m. - 5:00 p.m.) [phone] (after hours)

## 2018-11-19 DIAGNOSIS — M6281 Muscle weakness (generalized): Secondary | ICD-10-CM | POA: Diagnosis not present

## 2018-11-19 DIAGNOSIS — R1311 Dysphagia, oral phase: Secondary | ICD-10-CM | POA: Diagnosis not present

## 2018-11-19 DIAGNOSIS — F0280 Dementia in other diseases classified elsewhere without behavioral disturbance: Secondary | ICD-10-CM | POA: Diagnosis not present

## 2018-11-20 DIAGNOSIS — F0280 Dementia in other diseases classified elsewhere without behavioral disturbance: Secondary | ICD-10-CM | POA: Diagnosis not present

## 2018-11-20 DIAGNOSIS — M6281 Muscle weakness (generalized): Secondary | ICD-10-CM | POA: Diagnosis not present

## 2018-11-20 DIAGNOSIS — R1311 Dysphagia, oral phase: Secondary | ICD-10-CM | POA: Diagnosis not present

## 2018-11-21 DIAGNOSIS — R1311 Dysphagia, oral phase: Secondary | ICD-10-CM | POA: Diagnosis not present

## 2018-11-21 DIAGNOSIS — M6281 Muscle weakness (generalized): Secondary | ICD-10-CM | POA: Diagnosis not present

## 2018-11-21 DIAGNOSIS — F0280 Dementia in other diseases classified elsewhere without behavioral disturbance: Secondary | ICD-10-CM | POA: Diagnosis not present

## 2018-11-24 DIAGNOSIS — F0280 Dementia in other diseases classified elsewhere without behavioral disturbance: Secondary | ICD-10-CM | POA: Diagnosis not present

## 2018-11-24 DIAGNOSIS — M6281 Muscle weakness (generalized): Secondary | ICD-10-CM | POA: Diagnosis not present

## 2018-11-24 DIAGNOSIS — R1311 Dysphagia, oral phase: Secondary | ICD-10-CM | POA: Diagnosis not present

## 2018-11-25 DIAGNOSIS — M6281 Muscle weakness (generalized): Secondary | ICD-10-CM | POA: Diagnosis not present

## 2018-11-25 DIAGNOSIS — R1311 Dysphagia, oral phase: Secondary | ICD-10-CM | POA: Diagnosis not present

## 2018-11-25 DIAGNOSIS — F0280 Dementia in other diseases classified elsewhere without behavioral disturbance: Secondary | ICD-10-CM | POA: Diagnosis not present

## 2018-11-26 DIAGNOSIS — R1311 Dysphagia, oral phase: Secondary | ICD-10-CM | POA: Diagnosis not present

## 2018-11-26 DIAGNOSIS — M6281 Muscle weakness (generalized): Secondary | ICD-10-CM | POA: Diagnosis not present

## 2018-11-26 DIAGNOSIS — F0280 Dementia in other diseases classified elsewhere without behavioral disturbance: Secondary | ICD-10-CM | POA: Diagnosis not present

## 2018-11-27 ENCOUNTER — Non-Acute Institutional Stay (SKILLED_NURSING_FACILITY): Payer: Medicare Other | Admitting: Internal Medicine

## 2018-11-27 ENCOUNTER — Encounter: Payer: Self-pay | Admitting: Internal Medicine

## 2018-11-27 DIAGNOSIS — F0280 Dementia in other diseases classified elsewhere without behavioral disturbance: Secondary | ICD-10-CM | POA: Diagnosis not present

## 2018-11-27 DIAGNOSIS — F0281 Dementia in other diseases classified elsewhere with behavioral disturbance: Secondary | ICD-10-CM

## 2018-11-27 DIAGNOSIS — R1311 Dysphagia, oral phase: Secondary | ICD-10-CM | POA: Diagnosis not present

## 2018-11-27 DIAGNOSIS — I10 Essential (primary) hypertension: Secondary | ICD-10-CM

## 2018-11-27 DIAGNOSIS — E1129 Type 2 diabetes mellitus with other diabetic kidney complication: Secondary | ICD-10-CM | POA: Diagnosis not present

## 2018-11-27 DIAGNOSIS — E038 Other specified hypothyroidism: Secondary | ICD-10-CM

## 2018-11-27 DIAGNOSIS — E782 Mixed hyperlipidemia: Secondary | ICD-10-CM | POA: Diagnosis not present

## 2018-11-27 DIAGNOSIS — M6281 Muscle weakness (generalized): Secondary | ICD-10-CM | POA: Diagnosis not present

## 2018-11-27 DIAGNOSIS — G308 Other Alzheimer's disease: Secondary | ICD-10-CM

## 2018-11-27 NOTE — Progress Notes (Signed)
Location:    [facility] Room Number: 214-B Place of Service:  SNF [phone](31) Provider: Edmon CrapeArlo Lassen PA-C  Pecola LawlessHopper, William F, MD  Patient Care Team: Pecola LawlessHopper, William F, MD as PCP - General (Internal Medicine)  Extended Emergency Contact Information Primary Emergency Contact: Robinson,Roberto Address: [address]          [place], [place] [address] [place] Home Phone: [phone] Relation: Son Secondary Emergency Contact: Reid,Cynthia  [place] Mobile Phone: [phone] Relation: Daughter  Code Status:  Full Code Goals of care: Advanced Directive information Advanced Directives 11/27/2018  Does Patient Have a Medical Advance Directive? Yes  Type of Advance Directive (No Data)  Does patient want to make changes to medical advance directive? No - Patient declined  Copy of Healthcare Power of Attorney in Chart? -  Would patient like information on creating a medical advance directive? -  Pre-existing out of facility DNR order (yellow form or pink MOST form) -    Chief complaint-routine visit for medical management of chronic medical conditions including dementia- type 2 diabetes diet-controlled- hypothyroidism- depression- hypertension-hyperlipidemia.     HPI:  Pt is a 84 y.o. female seen today for medical management of chronic diseases.  As noted above.  Patient does have a history of significant dementia but appears to be doing relatively well with supportive care.  Her weight appears to be stable she is gained a few pounds but not tremendous amount appears about 3 4 pounds over the past month or 2.  She is a type II diabetic diet-controlled CBGs are largely in the lower 100s her recent hemoglobin A1c was 6.2 that was done in May.  Regards to dementia she continues on Aricept 10 mg a day Namenda 10 mg twice daily and also on Depakote 250 mg twice a day in addition to Abilify 5 mg a day.  Nursing does  not report behaviors recently she continues to have somewhat of a fearful expression on her face when you encounter her but this is not new and she is pleasant and cooperative with the exam.  She does continue on Paxil 15 mg a day she has a history of hypothyroidism with recently elevated TSHs and her Synthroid was recently increased to 200 mcg a day she will need this rechecked in approximately a month.  She also was treated with a short course of doxycycline recently because of some concerns for cellulitis of her right great toe this appears to be improved they are now doing topical treatment do not see overt signs of infection.  Currently she is lying in bed she appears to be comfortable when I asked her if she is having any pain discomfort or shortness of breath she says no.  Her only complaint is she feels a little chilly and would like a blanket placed.     Past Medical History:  Diagnosis Date  . Chronic kidney disease    Stage III  . Dementia with behavioral disturbance (HCC) 03/19/2015   Notated on FL2 Form from Dr. Dreama SaaJoel Brass [phone]  . Depression    previously saw Dr Donell BeersPlovsky  . Diabetes mellitus without complication (HCC)    11/18/15 A1c 6%  . Dyslipidemia    11/18/15 Tg 74, HDL 60, LDL 90 on statin  . HLD (hyperlipidemia)   . Hypertension   . Hypothyroidism    11/18/15 TSH 53.6, 02/07/16 TSH 9.42 @ [facility]  . MDD (major depressive  disorder)    With behavioral disturbance  . Scabies    02/21/16 [facility],[place]; Rx: Permethrin cream  . Vitamin D deficiency    11/18/15 vitamin D 25-hydroxy 39   Past Surgical History:  Procedure Laterality Date  . no record     04/10/15 [facility] note: see old chart    No Known Allergies  Allergies as of 11/27/2018   No Known Allergies     Medication List       Accurate as of November 27, 2018  2:22 PM. If you have any questions, ask your nurse or doctor.        acetaminophen 325 MG tablet Commonly known as:  TYLENOL Take 650 mg by mouth every 6 (six) hours as needed for mild pain or moderate pain.   ARIPiprazole 5 MG tablet Commonly known as: ABILIFY Take 5 mg by mouth daily.   aspirin EC 81 MG tablet Take 81 mg by mouth daily.   atorvastatin 40 MG tablet Commonly known as: LIPITOR Take 40 mg by mouth at bedtime.   bisacodyl 10 MG suppository Commonly known as: DULCOLAX If not relieved by MOM, give 10 mg Bisacodyl suppositiory rectally X 1 dose in 24 hours as needed (Do not use constipation standing orders for residents with renal failure/CFR less than 30. Contact MD for orders) (Physician Order)   Calcium-Vitamin D3 600-400 MG-UNIT Tabs Take 1 tablet by mouth 2 (two) times daily.   divalproex 250 MG DR tablet Commonly known as: DEPAKOTE Take 250 mg by mouth 2 (two) times daily.   donepezil 10 MG tablet Commonly known as: ARICEPT Take 10 mg by mouth at bedtime.   furosemide 20 MG tablet Commonly known as: LASIX Take 20 mg by mouth 3 (three) times a week. M-W-F   levothyroxine 200 MCG tablet Commonly known as: SYNTHROID Take 200 mcg by mouth daily before breakfast. HYPOTHYROIDISM What changed: Another medication with the same name was removed. Continue taking this medication, and follow the directions you see here. Changed by: Edmon CrapeArlo Lassen, PA-C   magnesium hydroxide 400 MG/5ML suspension Commonly known as: MILK OF MAGNESIA If no BM in 3 days, give 30 cc Milk of Magnesium p.o. x 1 dose in 24 hours as needed (Do not use standing constipation orders for residents with renal failure CFR less than 30. Contact MD for orders) (Physician Order)   memantine 10 MG tablet Commonly known as: NAMENDA Take 10 mg by mouth 2 (two) times daily.   Minerin Crea Apply topically to ankles twice a day for dry skin.   MULTI-VITAMIN DAILY PO Take one tablet by mouth once daily   Paxil 10 MG tablet Generic drug: PARoxetine Take 15 mg by mouth at bedtime. Take 1-1/2 tablets to = 15 mg qd    RA SALINE ENEMA RE If not relieved by Biscodyl suppository, give disposable Saline Enema rectally X 1 dose/24 hrs as needed (Do not use constipation standing orders for residents with renal failure/CFR less than 30. Contact MD for orders)(Physician Or   sennosides-docusate sodium 8.6-50 MG tablet Commonly known as: SENOKOT-S Take 2 tablets by mouth daily.   Vitamin D 50 MCG (2000 UT) tablet Take 2,000 Units by mouth daily.       Review of Systems   This is limited secondary to dementia but as noted above she is not complaining of any pain or discomfort or shortness of breath- nursing does not report any recent complaints or issues.    Immunization History  Administered Date(s)  Administered  . Influenza-Unspecified 02/17/2016, 04/15/2017  . PPD Test 04/08/2016  . Pneumococcal Conjugate-13 01/11/2017  . Pneumococcal-Unspecified 02/16/2014   Pertinent  Health Maintenance Due  Topic Date Due  . OPHTHALMOLOGY EXAM  05/08/2019 (Originally 10/12/1944)  . INFLUENZA VACCINE  01/17/2019  . HEMOGLOBIN A1C  01/30/2019  . FOOT EXAM  06/10/2019  . PNA vac Low Risk Adult  Completed  . URINE MICROALBUMIN  Discontinued  . DEXA SCAN  Discontinued   Fall Risk  01/14/2018 01/10/2017 08/07/2016 04/20/2016  Falls in the past year? No No Yes Yes  Number falls in past yr: - - 2 or more 2 or more  Injury with Fall? - - - Yes  Risk Factor Category  - - - High Fall Risk   Functional Status Survey:    Vitals:   11/27/18 1410  BP: (!) 142/73  Pulse: 68  Resp: 17  Temp: 97.8 F (36.6 C)  TempSrc: Oral  SpO2: 95%  Weight: 213 lb 9.6 oz (96.9 kg)  Height: 5\' 9"  (1.753 m)   Body mass index is 31.54 kg/m. Physical Exam In general this is a pleasant elderly female lying comfortably in bed.  Her skin is warm and dry-.  Eyes visual acuity appears to be intact sclera and conjunctive are clear she makes eye contact.  Oropharynx is clear mucous membranes moist.  Chest is clear to auscultation  with somewhat shallow air entry there is no labored breathing.  Heart is regular rhythm is somewhat bradycardic in the 50s she has trace lower extremity edema.  Abdomen is somewhat obese soft nontender with positive bowel sounds.  Musculoskeletal does have somewhat diffuse weakness but is able to move all extremities x4 at baseline with previous exam- right great toe there is some scabbing along the nailbed but I do not really see any concerning erythema or sign of infection it appears less tender than when I saw her last time.  Neurologic she is alert could not really appreciate lateralizing findings she does speak but not a whole lot.  Psych she is oriented to self continues with a somewhat apprehensive look in her face at times but is pleasant and cooperative   Labs reviewed:  Nov 13, 2018.  TSH 7.78.  Hemoglobin A1c 6.2   Recent Labs    08/22/18 09/01/18 11/01/18  NA 147 145 143  K 3.9 4.1 4.1  BUN 15 15 16   CREATININE 0.8 0.8 0.9   Recent Labs    01/04/18 02/18/18 03/04/18  AST 18 21 31   ALT 9 11 15   ALKPHOS 69 80 80   Recent Labs    06/20/18 08/22/18 11/01/18  WBC 3.9 3.6 4.8  NEUTROABS 1 1 1   HGB 12.5 12.2 12.9  HCT 37 36 40  PLT 126* 129* 162   Lab Results  Component Value Date   TSH 7.07 (A) 10/21/2018   Lab Results  Component Value Date   HGBA1C 6.1 08/01/2018   Lab Results  Component Value Date   CHOL 167 08/22/2018   HDL 49 08/22/2018   LDLCALC 106 08/22/2018   TRIG 58 08/22/2018    Significant Diagnostic Results in last 30 days:  No results found.  Assessment/Plan  #1 history of dementia she continues on Aricept 10 mg a day Namenda 10 mg twice daily Abilify 5 mg a day as well as Depakote 250 mg twice daily- this appears to be stable her weight appears to be relatively stable she appears to be doing pretty well with  supportive care nursing does not report any recent behaviors.  At this point will monitor.  2.  Type 2 diabetes this  appears to be doing well it is diet controlled hemoglobin A1c was 6.2 in May CBGs appear to be largely in the lower 100s.  3.-History of hypothyroidism TSH has been elevated recently her Synthroid was recently increased and will need an update TSH in approximately a month.  4.  Cellulitis right great toe this was fairly mild and appears largely resolved she is now receiving topical treatment at this point will monitor.  5 history of depression she continues on Paxil 50 mg a day.  6 history of hypertension she is on Lasix 3 times a week blood pressures appear somewhat variable but I do not see consistent systolics above 140- recent blood pressures 142/73-132/72 125/69- lowest one I see is 104/54 the highest when recently was 154/88 both of these are outliers.  7.  History of hyperlipidemia she is on Lipitor currently 40 mg a day LDL was 106 on March lab when coronavirus concerns ease will try to obtain an update lipid panel.  8.  History of UTI she has recently completed a course of Macrobid she does not appear to be symptomatic.  [id number]

## 2018-11-28 DIAGNOSIS — M6281 Muscle weakness (generalized): Secondary | ICD-10-CM | POA: Diagnosis not present

## 2018-11-28 DIAGNOSIS — F0280 Dementia in other diseases classified elsewhere without behavioral disturbance: Secondary | ICD-10-CM | POA: Diagnosis not present

## 2018-11-28 DIAGNOSIS — R1311 Dysphagia, oral phase: Secondary | ICD-10-CM | POA: Diagnosis not present

## 2018-12-01 DIAGNOSIS — R1311 Dysphagia, oral phase: Secondary | ICD-10-CM | POA: Diagnosis not present

## 2018-12-01 DIAGNOSIS — F0280 Dementia in other diseases classified elsewhere without behavioral disturbance: Secondary | ICD-10-CM | POA: Diagnosis not present

## 2018-12-01 DIAGNOSIS — M6281 Muscle weakness (generalized): Secondary | ICD-10-CM | POA: Diagnosis not present

## 2018-12-02 DIAGNOSIS — M6281 Muscle weakness (generalized): Secondary | ICD-10-CM | POA: Diagnosis not present

## 2018-12-02 DIAGNOSIS — R1311 Dysphagia, oral phase: Secondary | ICD-10-CM | POA: Diagnosis not present

## 2018-12-02 DIAGNOSIS — F0280 Dementia in other diseases classified elsewhere without behavioral disturbance: Secondary | ICD-10-CM | POA: Diagnosis not present

## 2018-12-03 DIAGNOSIS — M6281 Muscle weakness (generalized): Secondary | ICD-10-CM | POA: Diagnosis not present

## 2018-12-03 DIAGNOSIS — R1311 Dysphagia, oral phase: Secondary | ICD-10-CM | POA: Diagnosis not present

## 2018-12-03 DIAGNOSIS — F0280 Dementia in other diseases classified elsewhere without behavioral disturbance: Secondary | ICD-10-CM | POA: Diagnosis not present

## 2018-12-04 DIAGNOSIS — F0280 Dementia in other diseases classified elsewhere without behavioral disturbance: Secondary | ICD-10-CM | POA: Diagnosis not present

## 2018-12-04 DIAGNOSIS — R1311 Dysphagia, oral phase: Secondary | ICD-10-CM | POA: Diagnosis not present

## 2018-12-04 DIAGNOSIS — M6281 Muscle weakness (generalized): Secondary | ICD-10-CM | POA: Diagnosis not present

## 2018-12-05 DIAGNOSIS — R1311 Dysphagia, oral phase: Secondary | ICD-10-CM | POA: Diagnosis not present

## 2018-12-05 DIAGNOSIS — F0280 Dementia in other diseases classified elsewhere without behavioral disturbance: Secondary | ICD-10-CM | POA: Diagnosis not present

## 2018-12-05 DIAGNOSIS — M6281 Muscle weakness (generalized): Secondary | ICD-10-CM | POA: Diagnosis not present

## 2018-12-08 DIAGNOSIS — F0280 Dementia in other diseases classified elsewhere without behavioral disturbance: Secondary | ICD-10-CM | POA: Diagnosis not present

## 2018-12-08 DIAGNOSIS — M6281 Muscle weakness (generalized): Secondary | ICD-10-CM | POA: Diagnosis not present

## 2018-12-08 DIAGNOSIS — R1311 Dysphagia, oral phase: Secondary | ICD-10-CM | POA: Diagnosis not present

## 2018-12-09 DIAGNOSIS — M6281 Muscle weakness (generalized): Secondary | ICD-10-CM | POA: Diagnosis not present

## 2018-12-09 DIAGNOSIS — R1311 Dysphagia, oral phase: Secondary | ICD-10-CM | POA: Diagnosis not present

## 2018-12-09 DIAGNOSIS — F0280 Dementia in other diseases classified elsewhere without behavioral disturbance: Secondary | ICD-10-CM | POA: Diagnosis not present

## 2018-12-10 DIAGNOSIS — M6281 Muscle weakness (generalized): Secondary | ICD-10-CM | POA: Diagnosis not present

## 2018-12-10 DIAGNOSIS — R1311 Dysphagia, oral phase: Secondary | ICD-10-CM | POA: Diagnosis not present

## 2018-12-10 DIAGNOSIS — F0280 Dementia in other diseases classified elsewhere without behavioral disturbance: Secondary | ICD-10-CM | POA: Diagnosis not present

## 2018-12-11 DIAGNOSIS — F0280 Dementia in other diseases classified elsewhere without behavioral disturbance: Secondary | ICD-10-CM | POA: Diagnosis not present

## 2018-12-11 DIAGNOSIS — R1311 Dysphagia, oral phase: Secondary | ICD-10-CM | POA: Diagnosis not present

## 2018-12-11 DIAGNOSIS — M6281 Muscle weakness (generalized): Secondary | ICD-10-CM | POA: Diagnosis not present

## 2018-12-22 ENCOUNTER — Non-Acute Institutional Stay (SKILLED_NURSING_FACILITY): Payer: Medicare Other | Admitting: Internal Medicine

## 2018-12-22 ENCOUNTER — Encounter: Payer: Self-pay | Admitting: Internal Medicine

## 2018-12-22 DIAGNOSIS — E782 Mixed hyperlipidemia: Secondary | ICD-10-CM

## 2018-12-22 DIAGNOSIS — F0281 Dementia in other diseases classified elsewhere with behavioral disturbance: Secondary | ICD-10-CM

## 2018-12-22 DIAGNOSIS — I10 Essential (primary) hypertension: Secondary | ICD-10-CM | POA: Diagnosis not present

## 2018-12-22 DIAGNOSIS — E1129 Type 2 diabetes mellitus with other diabetic kidney complication: Secondary | ICD-10-CM

## 2018-12-22 DIAGNOSIS — G308 Other Alzheimer's disease: Secondary | ICD-10-CM

## 2018-12-22 DIAGNOSIS — E038 Other specified hypothyroidism: Secondary | ICD-10-CM | POA: Diagnosis not present

## 2018-12-22 DIAGNOSIS — F02818 Dementia in other diseases classified elsewhere, unspecified severity, with other behavioral disturbance: Secondary | ICD-10-CM

## 2018-12-22 NOTE — Progress Notes (Signed)
Location:  [facility] Room Number: 214-B Place of Service:  SNF [phone](31) Provider:  Edmon CrapeArlo Neesha Langton, PA-C  Pecola LawlessHopper, William F, MD  Patient Care Team: Pecola LawlessHopper, William F, MD as PCP - General (Internal Medicine)  Extended Emergency Contact Information Primary Emergency Contact: Robinson,Roberto Address: [address]          [place], [place] [address] [place] Home Phone: [phone] Relation: Son Secondary Emergency Contact: Reid,Cynthia  [place] Mobile Phone: [phone] Relation: Daughter  Code Status:  Full Goals of care: Advanced Directive information Advanced Directives 11/27/2018  Does Patient Have a Medical Advance Directive? Yes  Type of Advance Directive (No Data)  Does patient want to make changes to medical advance directive? No - Patient declined  Copy of Healthcare Power of Attorney in Chart? -  Would patient like information on creating a medical advance directive? -  Pre-existing out of facility DNR order (yellow form or pink MOST form) -     Chief Complaint  Patient presents with  . Medical Management of Chronic Issues    Routine [facility] visit   Medical management of chronic medical conditions including dementia- type 2 diabetes-hypothyroidism- hypertension hyperlipidemia.   HPI:  Pt is an 84 y.o. female seen today for medical management of chronic diseases  Nursing has not noted any recent acute issues.  During her last routine visit was noted she had gained about 3 to 4 pounds over the previous level but this appears stabilized weight is 211.4 which is actually down a couple pounds from last month.  She is a type II diabetic which is diet-controlled CBGs continue to be well controlled largely in the 80s to low 100s her hemoglobin A1c was 6.2 on a lab done in late May.  .  In regards to her dementia diagnosis she is on Aricept 10 mg a day Namenda 10 mg twice daily and Depakote 250 mg 3 times daily as  well as Abilify 5 mg a day.  She continues at times apparently to have somewhat of a frightened depression on her face but that was not really the case when I saw her today she was pleasant and cooperative with exam but does have some baseline confusion.  Changes on Paxil with a history of depression.  She also has a history of hypothyroidism with an elevated TSH her Synthroid has been increased TSH appears to be trending down it was 7.78 on the lab done in late May update labs pending for later this month.  Currently she has no complaints vital signs appear to be stable   Past Medical History:  Diagnosis Date  . Chronic kidney disease    Stage III  . Dementia with behavioral disturbance (HCC) 03/19/2015   Notated on FL2 Form from Dr. Dreama SaaJoel Brass [phone]  . Depression    previously saw Dr Donell BeersPlovsky  . Diabetes mellitus without complication (HCC)    11/18/15 A1c 6%  . Dyslipidemia    11/18/15 Tg 74, HDL 60, LDL 90 on statin  . HLD (hyperlipidemia)   . Hypertension   . Hypothyroidism    11/18/15 TSH 53.6, 02/07/16 TSH 9.42 @ [facility]  . MDD (major depressive disorder)    With behavioral disturbance  . Scabies    02/21/16 [facility],[place],; Rx: Permethrin cream  . Vitamin D deficiency    11/18/15 vitamin D 25-hydroxy 39   Past Surgical History:  Procedure Laterality Date  . no record     04/10/15  [facility] note: see old chart    No Known Allergies  Outpatient Encounter Medications as of 12/22/2018  Medication Sig  . acetaminophen (TYLENOL) 325 MG tablet Take 650 mg by mouth every 6 (six) hours as needed for mild pain or moderate pain.  . ARIPiprazole (ABILIFY) 5 MG tablet Take 5 mg by mouth daily.  Marland Kitchen. aspirin EC 81 MG tablet Take 81 mg by mouth daily.   Marland Kitchen. atorvastatin (LIPITOR) 40 MG tablet Take 40 mg by mouth at bedtime.   . bisacodyl (DULCOLAX) 10 MG suppository If not relieved by MOM, give 10 mg Bisacodyl suppositiory rectally X 1 dose in 24 hours as needed (Do not  use constipation standing orders for residents with renal failure/CFR less than 30. Contact MD for orders) (Physician Order)  . Calcium Carb-Cholecalciferol (CALCIUM-VITAMIN D3) 600-400 MG-UNIT TABS Take 1 tablet by mouth 2 (two) times daily.  . Cholecalciferol (VITAMIN D) 2000 units tablet Take 2,000 Units by mouth daily.   . divalproex (DEPAKOTE) 250 MG DR tablet Take 250 mg by mouth 2 (two) times daily.  Marland Kitchen. donepezil (ARICEPT) 10 MG tablet Take 10 mg by mouth at bedtime.   . furosemide (LASIX) 20 MG tablet Take 20 mg by mouth every Monday, Wednesday, and Friday.   . levothyroxine (SYNTHROID) 200 MCG tablet Take 200 mcg by mouth daily before breakfast. HYPOTHYROIDISM  . magnesium hydroxide (MILK OF MAGNESIA) 400 MG/5ML suspension If no BM in 3 days, give 30 cc Milk of Magnesium p.o. x 1 dose in 24 hours as needed (Do not use standing constipation orders for residents with renal failure CFR less than 30. Contact MD for orders) (Physician Order)  . memantine (NAMENDA) 10 MG tablet Take 10 mg by mouth 2 (two) times daily.   . Multiple Vitamin (MULTI-VITAMIN DAILY PO) Take one tablet by mouth once daily   . PARoxetine (PAXIL) 10 MG tablet Take 15 mg by mouth at bedtime. Take 1-1/2 tablets to = 15 mg qd  . sennosides-docusate sodium (SENOKOT-S) 8.6-50 MG tablet Take 2 tablets by mouth daily.   . Skin Protectants, Misc. (MINERIN) CREA Apply topically to ankles twice a day for dry skin.  . Sodium Phosphates (RA SALINE ENEMA RE) If not relieved by Biscodyl suppository, give disposable Saline Enema rectally X 1 dose/24 hrs as needed (Do not use constipation standing orders for residents with renal failure/CFR less than 30. Contact MD for orders)(Physician Or   No facility-administered encounter medications on file as of 12/22/2018.     Review of Systems   This is limited secondary to dementia but nursing has not reported any recent issues.  And she does not have any complaints today  Immunization  History  Administered Date(s) Administered  . Influenza-Unspecified 02/17/2016, 04/15/2017, 08/01/2018  . PPD Test 04/08/2016  . Pneumococcal Conjugate-13 01/11/2017  . Pneumococcal-Unspecified 02/16/2014   Pertinent  Health Maintenance Due  Topic Date Due  . OPHTHALMOLOGY EXAM  05/08/2019 (Originally 10/12/1944)  . INFLUENZA VACCINE  01/17/2019  . HEMOGLOBIN A1C  01/30/2019  . FOOT EXAM  06/10/2019  . PNA vac Low Risk Adult  Completed  . URINE MICROALBUMIN  Discontinued  . DEXA SCAN  Discontinued   Fall Risk  01/14/2018 01/10/2017 08/07/2016 04/20/2016  Falls in the past year? No No Yes Yes  Number falls in past yr: - - 2 or more 2 or more  Injury with Fall? - - - Yes  Risk Factor Category  - - - High Fall Risk   Functional  Status Survey:    Vitals:   12/22/18 1215  BP: 117/70  Pulse: 72  Resp: (!) 21  Temp: 98 F (36.7 C)  TempSrc: Oral  SpO2: 95%  Weight: 211 lb 6.4 oz (95.9 kg)  Height: 5\' 9"  (1.753 m)   Body mass index is 31.22 kg/m. Physical Exam   In general this is a well-nourished elderly female in no distress and comfortably in her wheelchair.  Her skin is warm and dry.  Eyes-visually acuity  appears to be intact sclera and conjunctive are clear  Oropharynx is clear mucous membranes moist.  Chest is clear to auscultation there is no labored breathing air entry is somewhat shallow.  Heart is regular rate and rhythm without murmur gallop or rub she has baseline mild lower extremity edema.  Abdomen is somewhat obese soft nontender with positive bowel sounds.   Musculoskeletal does have somewhat diffuse weakness but this appears unchanged from baseline does move all extremities x4.  Neurologic is grossly intact could not appreciate lateralizing findings her speech is clear does not speak a whole lot  Psych she is oriented to self she is pleasant does not really appear apprehensive today   Labs reviewed: Recent Labs    08/22/18 09/01/18 11/01/18  NA  147 145 143  K 3.9 4.1 4.1  BUN 15 15 16   CREATININE 0.8 0.8 0.9   Recent Labs    01/04/18 02/18/18 03/04/18  AST 18 21 31   ALT 9 11 15   ALKPHOS 69 80 80   Recent Labs    06/20/18 08/22/18 11/01/18  WBC 3.9 3.6 4.8  NEUTROABS 1 1 1   HGB 12.5 12.2 12.9  HCT 37 36 40  PLT 126* 129* 162   Lab Results  Component Value Date   TSH 7.07 (A) 10/21/2018   Lab Results  Component Value Date   HGBA1C 6.1 08/01/2018   Lab Results  Component Value Date   CHOL 167 08/22/2018   HDL 49 08/22/2018   LDLCALC 106 08/22/2018   TRIG 58 08/22/2018    Significant Diagnostic Results in last 30 days:  No results found.  Assessment/Plan  #1 dementia-this appears to be stable continue supportive care weight appears to be relatively stable she continues on Depakote 250 mg 3 times daily as well as Abilify 5 mg a day Aricept 10 mg a day and Namenda 10 mg twice daily.  2 history of type 2 diabetes this appears well controlled on diet alone hemoglobin A1c was 6.2 back in May CBGs largely in the 80s to low 100s.  3.  Hypothyroidism TSH has been elevated but is coming down with increasing doses of Synthroid most recent TSH was 7.78 update is pending later this month --she is on Synthroid 200 mcg a day now  #4- history of depression this appears stable on Paxil 15 mg a day- at times he does have somewhat of an apprehensive look on her face but this has not gotten any worse in fact she does not really appear apprehensive on exam today  #5 hypertension she is on Lasix 20 mg Monday Wednesday and Friday this appears stable recent blood pressure is 117/70-136/80- 134/78.  6 history of hyperlipidemia she is on Lipitor 40 mg a day LDL was 106 on lab done in March at this point will monitor have been somewhat conservative ordering update labs because of coronavirus quarantine- cholesterol panel in March did show her risk is one half of average with VLDL of 12 triglycerides of 58  HDL 49 and total cholesterol of  167   #7 history of edema this appears to be stabilized on Lasix 3 times a week her weight is relatively stabilized.  [id number]

## 2018-12-23 ENCOUNTER — Encounter: Payer: Self-pay | Admitting: Internal Medicine

## 2018-12-31 DIAGNOSIS — N183 Chronic kidney disease, stage 3 (moderate): Secondary | ICD-10-CM | POA: Diagnosis not present

## 2018-12-31 DIAGNOSIS — E039 Hypothyroidism, unspecified: Secondary | ICD-10-CM | POA: Diagnosis not present

## 2018-12-31 DIAGNOSIS — I129 Hypertensive chronic kidney disease with stage 1 through stage 4 chronic kidney disease, or unspecified chronic kidney disease: Secondary | ICD-10-CM | POA: Diagnosis not present

## 2018-12-31 LAB — TSH: TSH: 13.72 — AB (ref 0.41–5.90)

## 2019-01-06 ENCOUNTER — Encounter: Payer: Self-pay | Admitting: Adult Health

## 2019-01-06 ENCOUNTER — Non-Acute Institutional Stay (SKILLED_NURSING_FACILITY): Payer: Medicare Other | Admitting: Adult Health

## 2019-01-06 DIAGNOSIS — E038 Other specified hypothyroidism: Secondary | ICD-10-CM

## 2019-01-06 DIAGNOSIS — E119 Type 2 diabetes mellitus without complications: Secondary | ICD-10-CM

## 2019-01-06 NOTE — Progress Notes (Signed)
Location:  [facility] Room Number: 214/B Place of Service:  SNF (31) Provider:  Kenard GowerMedina-Vargas, [place], DNP, FNP-BC  Patient Care Team: Pecola LawlessHopper, William F, MD as PCP - General (Internal Medicine)  Extended Emergency Contact Information Primary Emergency Contact: Robinson,Roberto Address: [address]          [place], [place] [address] [place] Home Phone: [phone] Relation: Son Secondary Emergency Contact: Reid,Cynthia  [place] Mobile Phone: [phone] Relation: Daughter  Code Status:  Full Code  Goals of care: Advanced Directive information Advanced Directives 01/06/2019  Does Patient Have a Medical Advance Directive? Yes  Type of Advance Directive (No Data)  Does patient want to make changes to medical advance directive? No - Patient declined  Copy of Healthcare Power of Attorney in Chart? -  Would patient like information on creating a medical advance directive? -  Pre-existing out of facility DNR order (yellow form or pink MOST form) -     Chief Complaint  Patient presents with  . Acute Visit    Elevated TSH    HPI:  Pt is a 84 y.o. female seen today for elevated tsh 13.72. She denies having constipation nor difficulty sleeping. She is currently taking Levothyroxine 200 mcg daily for hypothyroidism. Her weight is stable - 211.4 lbs. She is alert to self, disoriented to time and place. Her CBGs are as follows - 91, 131, 129, 112,111,87.  HgbA1c 0n 5/20 was  .6.2. She is not on any medication for diabetes. She has PMH of dementia, hypothyroidism and hypertension.   Past Medical History:  Diagnosis Date  . Chronic kidney disease    Stage III  . Dementia with behavioral disturbance (HCC) 03/19/2015   Notated on FL2 Form from Dr. Dreama SaaJoel Brass [phone]  . Depression    previously saw Dr Donell BeersPlovsky  . Diabetes mellitus without complication (HCC)    11/18/15 A1c 6%  . Dyslipidemia    11/18/15 Tg 74, HDL 60, LDL  90 on statin  . HLD (hyperlipidemia)   . Hypertension   . Hypothyroidism    11/18/15 TSH 53.6, 02/07/16 TSH 9.42 @ [facility]  . MDD (major depressive disorder)    With behavioral disturbance  . Scabies    02/21/16 [facility],[place]; Rx: Permethrin cream  . Vitamin D deficiency    11/18/15 vitamin D 25-hydroxy 39   Past Surgical History:  Procedure Laterality Date  . no record     04/10/15 [facility] note: see old chart    No Known Allergies  Outpatient Encounter Medications as of 01/06/2019  Medication Sig  . acetaminophen (TYLENOL) 325 MG tablet Take 650 mg by mouth every 6 (six) hours as needed for mild pain or moderate pain.  . ARIPiprazole (ABILIFY) 5 MG tablet Take 5 mg by mouth daily.  Marland Kitchen. aspirin EC 81 MG tablet Take 81 mg by mouth daily.   Marland Kitchen. atorvastatin (LIPITOR) 40 MG tablet Take 40 mg by mouth at bedtime.   . bisacodyl (DULCOLAX) 10 MG suppository If not relieved by MOM, give 10 mg Bisacodyl suppositiory rectally X 1 dose in 24 hours as needed (Do not use constipation standing orders for residents with renal failure/CFR less than 30. Contact MD for orders) (Physician Order)  . Calcium Carb-Cholecalciferol (CALCIUM-VITAMIN D3) 600-400 MG-UNIT TABS Take 1 tablet by mouth 2 (two) times daily.  . Cholecalciferol (VITAMIN D) 2000 units tablet Take 2,000 Units by mouth daily.   . divalproex (DEPAKOTE) 250 MG DR tablet  Take 250 mg by mouth 2 (two) times daily.  Marland Kitchen. donepezil (ARICEPT) 10 MG tablet Take 10 mg by mouth at bedtime.   . furosemide (LASIX) 20 MG tablet Take 20 mg by mouth every Monday, Wednesday, and Friday.   . levothyroxine (SYNTHROID) 200 MCG tablet Take 200 mcg by mouth daily before breakfast. HYPOTHYROIDISM  . magnesium hydroxide (MILK OF MAGNESIA) 400 MG/5ML suspension If no BM in 3 days, give 30 cc Milk of Magnesium p.o. x 1 dose in 24 hours as needed (Do not use standing constipation orders for residents with renal failure CFR less than 30. Contact MD for  orders) (Physician Order)  . memantine (NAMENDA) 10 MG tablet Take 10 mg by mouth 2 (two) times daily.   . Multiple Vitamin (MULTI-VITAMIN DAILY PO) Take one tablet by mouth once daily   . PARoxetine (PAXIL) 10 MG tablet Take 15 mg by mouth at bedtime. Take 1-1/2 tablets to = 15 mg qd  . sennosides-docusate sodium (SENOKOT-S) 8.6-50 MG tablet Take 2 tablets by mouth daily.   . Skin Protectants, Misc. (MINERIN) CREA Apply topically to ankles twice a day for dry skin.  . Sodium Phosphates (RA SALINE ENEMA RE) If not relieved by Biscodyl suppository, give disposable Saline Enema rectally X 1 dose/24 hrs as needed (Do not use constipation standing orders for residents with renal failure/CFR less than 30. Contact MD for orders)(Physician Or   No facility-administered encounter medications on file as of 01/06/2019.     Review of Systems  GENERAL: No change in appetite, no fatigue, no weight changes, no fever, chills or weakness MOUTH and THROAT: Denies oral discomfort, gingival pain or bleeding, pain from teeth or hoarseness   RESPIRATORY: no cough, SOB, DOE, wheezing, hemoptysis CARDIAC: No chest pain, edema or palpitations GI: No abdominal pain, diarrhea, constipation, heart burn, nausea or vomiting GU: Denies dysuria, frequency, hematuria, or discharge PSYCHIATRIC: Denies feelings of depression or anxiety. No report of hallucinations, insomnia, paranoia, or agitation   Immunization History  Administered Date(s) Administered  . Influenza-Unspecified 02/17/2016, 04/15/2017, 08/01/2018  . PPD Test 04/08/2016  . Pneumococcal Conjugate-13 01/11/2017  . Pneumococcal-Unspecified 02/16/2014   Pertinent  Health Maintenance Due  Topic Date Due  . OPHTHALMOLOGY EXAM  05/08/2019 (Originally 10/12/1944)  . INFLUENZA VACCINE  01/17/2019  . HEMOGLOBIN A1C  05/16/2019  . FOOT EXAM  06/10/2019  . PNA vac Low Risk Adult  Completed  . URINE MICROALBUMIN  Discontinued  . DEXA SCAN  Discontinued   Fall  Risk  01/14/2018 01/10/2017 08/07/2016 04/20/2016  Falls in the past year? No No Yes Yes  Number falls in past yr: - - 2 or more 2 or more  Injury with Fall? - - - Yes  Risk Factor Category  - - - High Fall Risk     Vitals:   01/06/19 1037  BP: 134/80  Pulse: 80  Resp: 20  Temp: 97.8 F (36.6 C)  TempSrc: Oral  SpO2: 95%  Weight: 211 lb 6.4 oz (95.9 kg)  Height: 5\' 9"  (1.753 m)   Body mass index is 31.22 kg/m.  Physical Exam  GENERAL APPEARANCE: Well nourished. In no acute distress. Obese SKIN:  Skin is warm and dry.  MOUTH and THROAT: Lips are without lesions. Oral mucosa is moist and without lesions. Tongue is normal in shape, size, and color and without lesions RESPIRATORY: Breathing is even & unlabored, BS CTAB CARDIAC: RRR, no murmur,no extra heart sounds, trace BLE edema GI: Abdomen soft, normal BS, no  masses, no tenderness EXTREMITIES:  Able to move X 4 extremities NEUROLOGICAL: There is no tremor. Speech is clear. Alert to self, disoriented to time and place. PSYCHIATRIC:  Affect and behavior are appropriate  Labs reviewed: Recent Labs    08/22/18 09/01/18 11/01/18  NA 147 145 143  K 3.9 4.1 4.1  BUN 15 15 16   CREATININE 0.8 0.8 0.9   Recent Labs    02/18/18 03/04/18  AST 21 31  ALT 11 15  ALKPHOS 80 80   Recent Labs    06/20/18 08/22/18 11/01/18  WBC 3.9 3.6 4.8  NEUTROABS 1 1 1   HGB 12.5 12.2 12.9  HCT 37 36 40  PLT 126* 129* 162   Lab Results  Component Value Date   TSH 13.72 (A) 12/31/2018   Lab Results  Component Value Date   HGBA1C 6.2 11/13/2018   Lab Results  Component Value Date   CHOL 167 08/22/2018   HDL 49 08/22/2018   LDLCALC 106 08/22/2018   TRIG 58 08/22/2018     Assessment/Plan  1. Other specified hypothyroidism Lab Results  Component Value Date   TSH 13.72 (A) 12/31/2018  - Add Levothyroxine 25 mcg daily together with Levothyroxine 200 mcg daily, total of 225 mcg daily, repeat tsh in 6 weeks    2. Diabetes  mellitus without complication [facility]) Lab Results  Component Value Date   HGBA1C 6.2 11/13/2018  - diet-controlled    Family/ staff Communication:  Discussed plan of care with resident and charge nurse.  Labs/tests ordered:  tsh in 6 weeks  Goals of care:      Durenda Age, DNP, FNP-BC [facility] [phone] (Monday-Friday 8:00 a.m. - 5:00 p.m.) [phone] (after hours)

## 2019-01-08 ENCOUNTER — Non-Acute Institutional Stay (SKILLED_NURSING_FACILITY): Payer: Medicare Other | Admitting: Adult Health

## 2019-01-08 ENCOUNTER — Encounter: Payer: Self-pay | Admitting: Adult Health

## 2019-01-08 DIAGNOSIS — F39 Unspecified mood [affective] disorder: Secondary | ICD-10-CM

## 2019-01-08 DIAGNOSIS — G308 Other Alzheimer's disease: Secondary | ICD-10-CM

## 2019-01-08 DIAGNOSIS — F0281 Dementia in other diseases classified elsewhere with behavioral disturbance: Secondary | ICD-10-CM | POA: Diagnosis not present

## 2019-01-08 DIAGNOSIS — E119 Type 2 diabetes mellitus without complications: Secondary | ICD-10-CM

## 2019-01-08 DIAGNOSIS — Z7189 Other specified counseling: Secondary | ICD-10-CM | POA: Diagnosis not present

## 2019-01-08 DIAGNOSIS — F339 Major depressive disorder, recurrent, unspecified: Secondary | ICD-10-CM | POA: Diagnosis not present

## 2019-01-08 DIAGNOSIS — F02818 Dementia in other diseases classified elsewhere, unspecified severity, with other behavioral disturbance: Secondary | ICD-10-CM

## 2019-01-08 DIAGNOSIS — I10 Essential (primary) hypertension: Secondary | ICD-10-CM

## 2019-01-08 DIAGNOSIS — E034 Atrophy of thyroid (acquired): Secondary | ICD-10-CM | POA: Diagnosis not present

## 2019-01-08 DIAGNOSIS — F329 Major depressive disorder, single episode, unspecified: Secondary | ICD-10-CM | POA: Insufficient documentation

## 2019-01-08 NOTE — Progress Notes (Signed)
Location:  [place] Room Number: 214-B Place of Service:  SNF (31) Provider:  Durenda Age, DNP, FNP-BC  Patient Care Team: Hendricks Limes, MD as PCP - General (Internal Medicine)  Extended Emergency Contact Information Primary Emergency Contact: Robinson,Roberto Address: [address]          [place], [place] [address] [name] Phone: [phone] Relation: Son Secondary Emergency Contact: Reid,Cynthia  [place] Mobile Phone: [phone] Relation: Daughter  Code Status:  Full Code  Goals of care: Advanced Directive information Advanced Directives 01/06/2019  Does Patient Have a Medical Advance Directive? Yes  Type of Advance Directive (No Data)  Does patient want to make changes to medical advance directive? No - Patient declined  Copy of [place] in Chart? -  Would patient like information on creating a medical advance directive? -  Pre-existing out of facility DNR order (yellow form or pink MOST form) -     Chief Complaint  Patient presents with  . Advanced Directive    Care Plan Meeting    HPI:  Pt is an 84 y.o. female seen today for a care plan meeting.  She is a long-term care resident of [facility]. She has a PMH of dementia, hypothyroidism, and hypertension. Care plan meeting was attended by NP, social worker and MDS coordinator. Son was called on the phone but did not answer. Message was left on voicemail. She is currently having restorative nursing program for eating and transfer. She needs cuing to eat her meals independently and, also, with transfers. Her tsh was elevated (13.72) so her Levothyroxine dosage was recently increased to 225 mcg daily. It was reported that she sometimes refuses care. Medications, vital signs and weights were reviewed. The meeting lasted for 18 minutes.   Past Medical History:  Diagnosis Date  . Chronic kidney  disease    Stage III  . Dementia with behavioral disturbance ([place]) 03/19/2015   Notated on FL2 Form from Dr. Agapito Games [phone]  . Depression    previously saw Dr Casimiro Needle  . Diabetes mellitus without complication ([place])    10/21/71 A1c 6%  . Dyslipidemia    11/18/15 Tg 74, HDL 60, LDL 90 on statin  . HLD (hyperlipidemia)   . Hypertension   . Hypothyroidism    11/18/15 TSH 53.6, 02/07/16 TSH 9.42 @ [facility]  . MDD (major depressive disorder)    With behavioral disturbance  . Scabies    02/21/16 [facility],[place]; Rx: Permethrin cream  . Vitamin D deficiency    11/18/15 vitamin D 25-hydroxy 39   Past Surgical History:  Procedure Laterality Date  . no record     04/10/15 [facility] note: see old chart    No Known Allergies  Outpatient Encounter Medications as of 01/08/2019  Medication Sig  . acetaminophen (TYLENOL) 325 MG tablet Take 650 mg by mouth every 6 (six) hours as needed for mild pain or moderate pain.  . ARIPiprazole (ABILIFY) 5 MG tablet Take 5 mg by mouth daily.  Marland Kitchen aspirin EC 81 MG tablet Take 81 mg by mouth daily.   Marland Kitchen atorvastatin (LIPITOR) 40 MG tablet Take 40 mg by mouth at bedtime.   . bisacodyl (DULCOLAX) 10 MG suppository If not relieved by MOM, give 10 mg Bisacodyl suppositiory rectally X 1 dose in 24 hours as needed (Do not use constipation standing orders for residents with renal failure/CFR less than 30. Contact MD for orders) (  Physician Order)  . Calcium Carb-Cholecalciferol (CALCIUM-VITAMIN D3) 600-400 MG-UNIT TABS Take 1 tablet by mouth 2 (two) times daily.  . Cholecalciferol (VITAMIN D) 2000 units tablet Take 2,000 Units by mouth daily.   . divalproex (DEPAKOTE SPRINKLE) 125 MG capsule Take 125 mg by mouth 3 (three) times daily.  . divalproex (DEPAKOTE) 250 MG DR tablet Take 250 mg by mouth 2 (two) times daily.  Marland Kitchen. donepezil (ARICEPT) 10 MG tablet Take 10 mg by mouth at bedtime.   . furosemide (LASIX) 20 MG tablet Take 20 mg by mouth every Monday,  Wednesday, and Friday.   . levothyroxine (SYNTHROID) 200 MCG tablet Take 200 mcg by mouth daily before breakfast.   . magnesium hydroxide (MILK OF MAGNESIA) 400 MG/5ML suspension If no BM in 3 days, give 30 cc Milk of Magnesium p.o. x 1 dose in 24 hours as needed (Do not use standing constipation orders for residents with renal failure CFR less than 30. Contact MD for orders) (Physician Order)  . memantine (NAMENDA) 10 MG tablet Take 10 mg by mouth 2 (two) times daily.   . Multiple Vitamin (MULTI-VITAMIN DAILY PO) Take one tablet by mouth once daily   . PARoxetine (PAXIL) 10 MG tablet Take 15 mg by mouth at bedtime. Take 1-1/2 tablets to = 15 mg qd  . sennosides-docusate sodium (SENOKOT-S) 8.6-50 MG tablet Take 2 tablets by mouth daily.   . Skin Protectants, Misc. (MINERIN) CREA Apply topically to ankles twice a day for dry skin.  . Sodium Phosphates (RA SALINE ENEMA RE) If not relieved by Biscodyl suppository, give disposable Saline Enema rectally X 1 dose/24 hrs as needed (Do not use constipation standing orders for residents with renal failure/CFR less than 30. Contact MD for orders)(Physician Or  . [DISCONTINUED] divalproex (DEPAKOTE) 250 MG DR tablet Take 250 mg by mouth 2 (two) times daily.   No facility-administered encounter medications on file as of 01/08/2019.     Review of Systems  GENERAL: No change in appetite, no fatigue, no weight changes, no fever, chills or weakness MOUTH and THROAT: Denies oral discomfort, gingival pain or bleeding, pain from teeth or hoarseness   RESPIRATORY: no cough, SOB, DOE, wheezing, hemoptysis CARDIAC: No chest pain, edema or palpitations GI: No abdominal pain, diarrhea, constipation, heart burn, nausea or vomiting GU: Denies dysuria, frequency, hematuria, or discharge NEUROLOGICAL: Denies dizziness, syncope, numbness, or headache PSYCHIATRIC: Denies feelings of depression or anxiety. No report of hallucinations, insomnia, paranoia, or agitation    Immunization History  Administered Date(s) Administered  . Influenza-Unspecified 02/17/2016, 04/15/2017, 08/01/2018  . PPD Test 04/08/2016  . Pneumococcal Conjugate-13 01/11/2017  . Pneumococcal-Unspecified 02/16/2014   Pertinent  Health Maintenance Due  Topic Date Due  . OPHTHALMOLOGY EXAM  05/08/2019 (Originally 10/12/1944)  . INFLUENZA VACCINE  01/17/2019  . HEMOGLOBIN A1C  05/16/2019  . FOOT EXAM  06/10/2019  . PNA vac Low Risk Adult  Completed  . URINE MICROALBUMIN  Discontinued  . DEXA SCAN  Discontinued   Fall Risk  01/14/2018 01/10/2017 08/07/2016 04/20/2016  Falls in the past year? No No Yes Yes  Number falls in past yr: - - 2 or more 2 or more  Injury with Fall? - - - Yes  Risk Factor Category  - - - High Fall Risk     Vitals:   01/08/19 1005  BP: 111/72  Pulse: 82  Resp: 20  Temp: 97.7 F (36.5 C)  TempSrc: Oral  Weight: 211 lb 6.4 oz (95.9 kg)  Height:  5\' 9"  (1.753 m)   Body mass index is 31.22 kg/m.  Physical Exam  GENERAL APPEARANCE: Well nourished. In no acute distress. Obese SKIN:  Skin is warm and dry.  MOUTH and THROAT: Lips are without lesions. Oral mucosa is moist and without lesions. Tongue is normal in shape, size, and color and without lesions RESPIRATORY: Breathing is even & unlabored, BS CTAB CARDIAC: RRR, no murmur,no extra heart sounds, BLE trace edema GI: Abdomen soft, normal BS, no masses, no tenderness EXTREMITIES:  Able to move X 4 extremities NEUROLOGICAL: There is no tremor. Speech is clear. Alert to self, disoriented to time and place. PSYCHIATRIC:  Affect and behavior are appropriate  Labs reviewed: Recent Labs    08/22/18 09/01/18 11/01/18  NA 147 145 143  K 3.9 4.1 4.1  BUN 15 15 16   CREATININE 0.8 0.8 0.9   Recent Labs    02/18/18 03/04/18  AST 21 31  ALT 11 15  ALKPHOS 80 80   Recent Labs    06/20/18 08/22/18 11/01/18  WBC 3.9 3.6 4.8  NEUTROABS 1 1 1   HGB 12.5 12.2 12.9  HCT 37 36 40  PLT 126* 129* 162   Lab  Results  Component Value Date   TSH 13.72 (A) 12/31/2018   Lab Results  Component Value Date   HGBA1C 6.2 11/13/2018   Lab Results  Component Value Date   CHOL 167 08/22/2018   HDL 49 08/22/2018   LDLCALC 106 08/22/2018   TRIG 58 08/22/2018    Assessment/Plan  1. Essential hypertension - well-controlled, continue Lasix 20 mg 1 tab q. MWF  2. Diabetes mellitus without complication (HCC) Lab Results  Component Value Date   HGBA1C 6.2 11/13/2018  -Diet controlled, CBGs are well controlled, discontinue CBG checks   3. Hypothyroidism due to acquired atrophy of thyroid Lab Results  Component Value Date   TSH 13.72 (A) 12/31/2018  -Recently increased levothyroxine from 200 mcg to 225 mcg daily, for repeat TSH in 6 weeks  4. Mood disorder with psychosis (HCC) -Stable, continue divalproex 250 mg 1 tab twice a day and aripiprazole 5 mg 1 tab daily  5. Recurrent major depressive disorder, remission status unspecified (HCC) -Stable, continue paroxetine 10 mg 1 1/2 tab =15 mg daily  6. Alzheimer's disease of other onset with behavioral disturbance (HCC) -Continue donepezil 10 mg 1 tab at bedtime, memantine 10 mg 1 tab twice a day, supportive care and fall precautions  7.  Advanced care planning -Discussed medications, vital signs and weights with IDT   Family/ staff Communication: Discussed plan of care with IDT.  Labs/tests ordered: None  Goals of care:   Long-term care.   Kenard GowerMonina Medina-Vargas, DNP, FNP-BC [facility] [phone] (Monday-Friday 8:00 a.m. - 5:00 p.m.) [phone] (after hours)

## 2019-01-09 DIAGNOSIS — B342 Coronavirus infection, unspecified: Secondary | ICD-10-CM | POA: Diagnosis not present

## 2019-01-16 ENCOUNTER — Encounter: Payer: Self-pay | Admitting: Adult Health

## 2019-01-16 ENCOUNTER — Non-Acute Institutional Stay (SKILLED_NURSING_FACILITY): Payer: Medicare Other | Admitting: Adult Health

## 2019-01-16 DIAGNOSIS — Z0000 Encounter for general adult medical examination without abnormal findings: Secondary | ICD-10-CM | POA: Diagnosis not present

## 2019-01-16 NOTE — Progress Notes (Signed)
Subjective:   Kelly [name] is an 84 y.o. female who presents for Medicare Annual (Subsequent) preventive examination.  Review of Systems:   Cardiac Risk Factors include: advanced age (>4755men, 56>65 women);obesity (BMI >30kg/m2)     Objective:     Vitals: BP 138/60   Pulse 78   Temp 98.7 F (37.1 C) (Oral)   Resp 20   Ht 5\' 9"  (1.753 m)   Wt 211 lb 6.4 oz (95.9 kg)   BMI 31.22 kg/m   Body mass index is 31.22 kg/m.  Advanced Directives 01/16/2019 01/06/2019 11/27/2018 11/18/2018 11/17/2018 11/06/2018 10/13/2018  Does Patient Have a Medical Advance Directive? No Yes Yes Yes Yes Yes Yes  Type of Advance Directive - (No Data) (No Data) (No Data) (No Data) (No Data) (No Data)  Does patient want to make changes to medical advance directive? - No - Patient declined No - Patient declined No - Patient declined No - Patient declined No - Patient declined No - Patient declined  Copy of Healthcare Power of Attorney in Chart? - - - - - - -  Would patient like information on creating a medical advance directive? - - - - - - -  Pre-existing out of facility DNR order (yellow form or pink MOST form) - - - - - - -    Tobacco Social History   Tobacco Use  Smoking Status Former Smoker  Smokeless Tobacco Never Used  Tobacco Comment   Pt has not smoked while at [facility] staff.      Counseling given: Not Answered Comment: Pt has not smoked while at SNF per staff.    Clinical Intake:  Pre-visit preparation completed: No  Pain : No/denies pain     BMI - recorded: 31.22 Nutritional Status: BMI > 30  Obese Nutritional Risks: None  How often do you need to have someone help you when you read instructions, pamphlets, or other written materials from your doctor or pharmacy?: 5 - Always What is the last grade level you completed in school?: 10th grade  Interpreter Needed?: No  Information entered by :: Hoke Baer Medina-Vargas - NP  Past Medical History:  Diagnosis Date  . Chronic kidney disease     Stage III  . Dementia with behavioral disturbance (HCC) 03/19/2015   Notated on FL2 Form from Dr. Dreama SaaJoel Brass [phone]  . Depression    previously saw Dr Donell BeersPlovsky  . Diabetes mellitus without complication (HCC)    11/18/15 A1c 6%  . Dyslipidemia    11/18/15 Tg 74, HDL 60, LDL 90 on statin  . HLD (hyperlipidemia)   . Hypertension   . Hypothyroidism    11/18/15 TSH 53.6, 02/07/16 TSH 9.42 @ [facility]  . MDD (major depressive disorder)    With behavioral disturbance  . Scabies    02/21/16 [facility],[place]; Rx: Permethrin cream  . Vitamin D deficiency    11/18/15 vitamin D 25-hydroxy 39   Past Surgical History:  Procedure Laterality Date  . no record     04/10/15 [facility] note: see old chart   Family History  Family history unknown: Yes   Social History   Socioeconomic History  . Marital status: Widowed    Spouse name: Not on file  . Number of children: Not on file  . Years of education: Not on file  . Highest education level: Not on file  Occupational History  . Not on file  Social Needs  . Financial resource strain: Not on file  .  Food insecurity    Worry: Not on file    Inability: Not on file  . Transportation needs    Medical: Not on file    Non-medical: Not on file  Tobacco Use  . Smoking status: Former Research scientist (life sciences)  . Smokeless tobacco: Never Used  . Tobacco comment: Pt has not smoked while at SNF per staff.   Substance and Sexual Activity  . Alcohol use: No  . Drug use: No  . Sexual activity: Never  Lifestyle  . Physical activity    Days per week: Not on file    Minutes per session: Not on file  . Stress: Not on file  Relationships  . Social Herbalist on phone: Not on file    Gets together: Not on file    Attends religious service: Not on file    Active member of club or organization: Not on file    Attends meetings of clubs or organizations: Not on file    Relationship status: Not on file  Other Topics Concern  . Not on file   Social History Narrative  . Not on file    Outpatient Encounter Medications as of 01/16/2019  Medication Sig  . acetaminophen (TYLENOL) 325 MG tablet Take 650 mg by mouth every 6 (six) hours as needed for mild pain or moderate pain.  . ARIPiprazole (ABILIFY) 5 MG tablet Take 5 mg by mouth daily.  Marland Kitchen aspirin EC 81 MG tablet Take 81 mg by mouth daily.   Marland Kitchen atorvastatin (LIPITOR) 40 MG tablet Take 40 mg by mouth at bedtime.   . bisacodyl (DULCOLAX) 10 MG suppository If not relieved by MOM, give 10 mg Bisacodyl suppositiory rectally X 1 dose in 24 hours as needed (Do not use constipation standing orders for residents with renal failure/CFR less than 30. Contact MD for orders) (Physician Order)  . Calcium Carb-Cholecalciferol (CALCIUM-VITAMIN D3) 600-400 MG-UNIT TABS Take 1 tablet by mouth 2 (two) times daily.  . Cholecalciferol (VITAMIN D) 2000 units tablet Take 2,000 Units by mouth daily.   . divalproex (DEPAKOTE) 250 MG DR tablet Take 250 mg by mouth 2 (two) times daily.  Marland Kitchen donepezil (ARICEPT) 10 MG tablet Take 10 mg by mouth at bedtime.   . furosemide (LASIX) 20 MG tablet Take 20 mg by mouth every Monday, Wednesday, and Friday.   . levothyroxine (SYNTHROID) 200 MCG tablet Take 200 mcg by mouth daily before breakfast.   . levothyroxine (SYNTHROID) 25 MCG tablet Take 25 mcg by mouth daily before breakfast.  . magnesium hydroxide (MILK OF MAGNESIA) 400 MG/5ML suspension If no BM in 3 days, give 30 cc Milk of Magnesium p.o. x 1 dose in 24 hours as needed (Do not use standing constipation orders for residents with renal failure CFR less than 30. Contact MD for orders) (Physician Order)  . memantine (NAMENDA) 10 MG tablet Take 10 mg by mouth 2 (two) times daily.   . Multiple Vitamin (MULTI-VITAMIN DAILY PO) Take one tablet by mouth once daily   . PARoxetine (PAXIL) 10 MG tablet Take 15 mg by mouth at bedtime. Take 1-1/2 tablets to = 15 mg qd  . sennosides-docusate sodium (SENOKOT-S) 8.6-50 MG tablet  Take 2 tablets by mouth daily.   . Skin Protectants, Misc. (MINERIN) CREA Apply topically to ankles twice a day for dry skin.  . Sodium Phosphates (RA SALINE ENEMA RE) If not relieved by Biscodyl suppository, give disposable Saline Enema rectally X 1 dose/24 hrs as needed (Do not  use constipation standing orders for residents with renal failure/CFR less than 30. Contact MD for orders)(Physician Or  . [DISCONTINUED] divalproex (DEPAKOTE SPRINKLE) 125 MG capsule Take 125 mg by mouth 3 (three) times daily.   No facility-administered encounter medications on file as of 01/16/2019.     Activities of Daily Living In your present state of health, do you have any difficulty performing the following activities: 01/16/2019  Hearing? N  Vision? N  Difficulty concentrating or making decisions? Y  Walking or climbing stairs? Y  Dressing or bathing? Y  Doing errands, shopping? Y  Preparing Food and eating ? Y  Using the Toilet? Y  In the past six months, have you accidently leaked urine? Y  Do you have problems with loss of bowel control? Y  Managing your Medications? Y  Managing your Finances? Y  Housekeeping or managing your Housekeeping? Y  Some recent data might be hidden    Patient Care Team: Pecola LawlessHopper, William F, MD as PCP - General (Internal Medicine)    Assessment:   This is a routine wellness examination for [place].  Exercise Activities and Dietary recommendations Current Exercise Habits: Structured exercise class, Type of exercise: stretching, Time (Minutes): 10, Frequency (Times/Week): 2, Weekly Exercise (Minutes/Week): 20, Intensity: Mild  Goals   None     Fall Risk Fall Risk  01/16/2019 01/14/2018 01/10/2017 08/07/2016 04/20/2016  Falls in the past year? 1 No No Yes Yes  Number falls in past yr: 1 - - 2 or more 2 or more  Injury with Fall? 0 - - - Yes  Risk Factor Category  - - - - High Fall Risk  Risk for fall due to : History of fall(s);Impaired balance/gait;Impaired mobility -  - - -  Follow up Falls evaluation completed;Falls prevention discussed;Education provided - - - -   Is the patient's home free of loose throw rugs in walkways, pet beds, electrical cords, etc?   yes      Grab bars in the bathroom? yes      Handrails on the stairs?   Lives in a facility without stairs      Adequate lighting?   yes  Timed Get Up and Go performed: NO  Depression Screen PHQ 2/9 Scores 01/16/2019 01/14/2018 01/10/2017  PHQ - 2 Score 0 - 6  PHQ- 9 Score - - 14  Exception Documentation - Medical reason -     Cognitive Function MMSE - Mini Mental State Exam 01/16/2019  Orientation to time 0  Orientation to Place 0  Registration 0  Attention/ Calculation 0  Recall 3  Language- name 2 objects 1  Language- repeat 1  Language- follow 3 step command 3  Language- read & follow direction 1  Write a sentence 1  Copy design 1  Total score 11     6CIT Screen 01/14/2018 01/10/2017  What Year? 4 points 4 points  What month? 3 points 3 points  What time? 0 points 3 points  Count back from 20 4 points 4 points  Months in reverse 4 points 4 points  Repeat phrase 10 points 10 points  Total Score 25 28    Immunization History  Administered Date(s) Administered  . Influenza-Unspecified 02/17/2016, 04/15/2017, 08/01/2018  . PPD Test 04/08/2016  . Pneumococcal Conjugate-13 01/11/2017  . Pneumococcal-Unspecified 02/16/2014    Qualifies for Shingles Vaccine? No  Screening Tests Health Maintenance  Topic Date Due  . OPHTHALMOLOGY EXAM  05/08/2019 (Originally 10/12/1944)  . TETANUS/TDAP  12/22/2019 (Originally 10/12/1953)  .  INFLUENZA VACCINE  01/17/2019  . HEMOGLOBIN A1C  05/16/2019  . FOOT EXAM  06/10/2019  . PNA vac Low Risk Adult  Completed  . URINE MICROALBUMIN  Discontinued  . DEXA SCAN  Discontinued    Cancer Screenings: Lung: Low Dose CT Chest recommended if Age 3-80 years, 30 pack-year currently smoking OR have quit w/in 15years. Patient does not qualify.  Breast:  Up to date on Mammogram? No   Up to date of Bone Density/Dexa? No Colorectal: Stool occult blood X 3 ordered 01/16/19  Additional Screenings:  Hepatitis C Screening:  Hepatitis C virus antibody ordered 01/16/19     Plan:    I have personally reviewed and noted the following in the patient's chart:   . Medical and social history . Use of alcohol, tobacco or illicit drugs  . Current medications and supplements . Functional ability and status . Nutritional status . Physical activity . Advanced directives . List of other physicians . Hospitalizations, surgeries, and ER visits in previous 12 months . Vitals . Screenings to include cognitive, depression, and falls . Referrals and appointments  In addition, I have reviewed and discussed with patient certain preventive protocols, quality metrics, and best practice recommendations. A written personalized care plan for preventive services as well as general preventive health recommendations were provided to patient.     Koi Yarbro Medina-Vargas, DNP, FNP-BC 01/16/2019  Body mass index is 31.22 kg/m.

## 2019-01-21 DIAGNOSIS — E039 Hypothyroidism, unspecified: Secondary | ICD-10-CM | POA: Diagnosis not present

## 2019-01-22 ENCOUNTER — Encounter: Payer: Self-pay | Admitting: Internal Medicine

## 2019-01-22 NOTE — Progress Notes (Signed)
This encounter was created in error - please disregard.

## 2019-01-23 LAB — HM HEPATITIS C SCREENING LAB: HM Hepatitis Screen: NEGATIVE

## 2019-01-27 ENCOUNTER — Non-Acute Institutional Stay (SKILLED_NURSING_FACILITY): Payer: Medicare Other | Admitting: Internal Medicine

## 2019-01-27 ENCOUNTER — Encounter: Payer: Self-pay | Admitting: Internal Medicine

## 2019-01-27 DIAGNOSIS — R7303 Prediabetes: Secondary | ICD-10-CM | POA: Diagnosis not present

## 2019-01-27 DIAGNOSIS — E034 Atrophy of thyroid (acquired): Secondary | ICD-10-CM

## 2019-01-27 DIAGNOSIS — N189 Chronic kidney disease, unspecified: Secondary | ICD-10-CM | POA: Diagnosis not present

## 2019-01-27 DIAGNOSIS — I10 Essential (primary) hypertension: Secondary | ICD-10-CM

## 2019-01-27 NOTE — Assessment & Plan Note (Addendum)
11/13/2018 A1c prediabetic at 6.2%.  Fasting glucoses at the facility ranged from 87 up to 138.  No indication for change in therapy.  A1c can be checked every 4-6 months as she is on Abilify which can cause hyperglycemia.  Glucose monitoring can be D/Ced. Diagnosis in problem list changed to prediabetes from diabetes.

## 2019-01-27 NOTE — Assessment & Plan Note (Signed)
On 12/31/2018 TSH had risen from 7.78 up to 13.72.  L-thyroxine dose was increased to 225 mcg daily with repeat TSH after 8-10 weeks.

## 2019-01-27 NOTE — Assessment & Plan Note (Signed)
Creatinine was 0.9 on 11/01/2018.  No nephrotoxic drugs identified in the med list.

## 2019-01-27 NOTE — Progress Notes (Signed)
NURSING HOME LOCATION:  [facility] ROOM NUMBER:  214-B  CODE STATUS:  Full Code  PCP:  Hendricks Limes, MD [place] [address]   This is a nursing facility follow up of chronic medical diagnoses.  Interim medical record and care since last [place] visit was updated with review of diagnostic studies and change in clinical status since last visit were documented.  HPI: She is a permanent resident of the facility with medical diagnoses of hypothyroidism, essential hypertension, dyslipidemia, diabetes, major depressive disorder, thrombocytopenia, chronic leukopenia and dementia with behavioral disturbance.  Glucoses at the facility have ranged from a fasting of 87 up to a high of 138.  A1c was prediabetic with a value of 6.2% on 5/28.  Also on that same date TSH was 7.78.  On 12/31/2018 it had risen to 13.72.  L-thyroxine dose was increased.   Despite a history of CKD her creatinine was 0.9 on 11/01/2018. Hepatitis screening was negative earlier this month.  Review of systems: PT/OT states that the patient is spending more time in bed and has had increasing confusion. Dementia invalidated responses.  She was unable to give the date.   Her only concern or positive ROS was being "tired and losing this weight".  She stated that she thought she was "going to retire and collect unemployment".  Constitutional: No fever Cardiovascular: No chest pain, palpitations, paroxysmal nocturnal dyspnea  Respiratory: No cough, sputum production, hemoptysis, DOE, significant snoring, apnea   Gastrointestinal: No heartburn, dysphagia, abdominal pain, nausea /vomiting, rectal bleeding, melena, change in bowels Genitourinary: No dysuria, hematuria, pyuria, incontinence, nocturia Musculoskeletal: No joint stiffness, joint swelling, weakness, pain Dermatologic: No rash, pruritus, change in appearance of skin Neurologic: No dizziness, headache, syncope, seizures, numbness,  tingling Psychiatric: No significant anxiety, depression, insomnia, anorexia  Physical exam:  Pertinent or positive findings: Her facial expression is that of someone who is alarmed as her eyes get wide when she answers.  Otherwise there is decreased facial expression overall.  She appears to be somewhat hard of hearing.  She had difficulty following commands such as testing of strength to oppostion.  She has minor bilateral rales at the bases.  Second heart sound is increased.  She has 1/2+ edema at the sock line.  Pedal pulses are decreased.  I cannot test strength in the upper extremities as she would not oppose my hand on her forearms.  Lower extremity strength was fair to opposition.  General appearance: Adequately nourished; no acute distress, increased work of breathing is present.   Lymphatic: No lymphadenopathy about the head, neck, axilla. Eyes: No conjunctival inflammation or lid edema is present. There is no scleral icterus. Ears:  External ear exam shows no significant lesions or deformities.   Nose:  External nasal examination shows no deformity or inflammation. Nasal mucosa are pink and moist without lesions, exudates Oral exam:  Lips and gums are healthy appearing. There is no oropharyngeal erythema or exudate. Neck:  No thyromegaly, masses, tenderness noted.    Heart:  Normal rate and regular rhythm. S1  normal without gallop, murmur, click, rub .  Lungs:  without wheezes, rhonchi, rubs. Abdomen: Bowel sounds are normal. Abdomen is soft and nontender with no organomegaly, hernias, masses. GU: Deferred  Extremities:  No cyanosis, clubbing  Neurologic exam : Balance, [place]omberg, finger to nose testing could not be completed due to clinical state Skin: Warm & dry w/o tenting. No significant lesions or rash.  See summary under each  active problem in the Problem List with associated updated therapeutic plan

## 2019-01-27 NOTE — Patient Instructions (Signed)
See assessment and plan under each diagnosis in the problem list and acutely for this visit 

## 2019-01-27 NOTE — Assessment & Plan Note (Signed)
BP controlled; no change in antihypertensive medications  

## 2019-02-16 ENCOUNTER — Encounter: Payer: Self-pay | Admitting: Adult Health

## 2019-02-16 ENCOUNTER — Non-Acute Institutional Stay (SKILLED_NURSING_FACILITY): Payer: Medicare Other | Admitting: Adult Health

## 2019-02-16 DIAGNOSIS — R195 Other fecal abnormalities: Secondary | ICD-10-CM

## 2019-02-16 NOTE — Progress Notes (Signed)
Location:  [facility] Room Number: 214/B Place of Service:  SNF (31) Provider:  Durenda Age, DNP, FNP-BC  Patient Care Team: Hendricks Limes, MD as PCP - General (Internal Medicine)  Extended Emergency Contact Information Primary Emergency Contact: Robinson,Roberto Address: [address]          [place], [place] [address] [name] of [place] Phone: [phone] Relation: Son Secondary Emergency Contact: Reid,Cynthia  [place] Mobile Phone: [phone] Relation: Daughter  Code Status:  Full Code  Goals of care: Advanced Directive information Advanced Directives 02/16/2019  Does Patient Have a Medical Advance Directive? Yes  Type of Advance Directive (No Data)  Does patient want to make changes to medical advance directive? No - Patient declined  Copy of [place] in Chart? -  Would patient like information on creating a medical advance directive? -  Pre-existing out of facility DNR order (yellow form or pink MOST form) -     Chief Complaint  Patient presents with  . Acute Visit    Positive stool occult blood    HPI:  Pt is a 84 y.o. female seen today for an acute visit regarding 2/3 positive stool occult blood. The 1st and 2nd were positive. However, the 3rd one was negative. She was seen today. She denies being constipated. She takes ASA 81 mg daily for cardiovascular prophylaxis. No reported bloody stool. Last hgb 12.9 and platelet 162 (5/20). She denies abdominal pain, nausea nor vomiting.  She has history of thrombocytopenia.    Past Medical History:  Diagnosis Date  . Chronic kidney disease    Stage III  . Dementia with behavioral disturbance ([place]) 03/19/2015   Notated on FL2 Form from Dr. Agapito Games [phone]  . Depression    previously saw Dr Casimiro Needle  . Diabetes mellitus without complication ([place])    08/23/83 A1c 6%  . Dyslipidemia    11/18/15 Tg 74, HDL 60, LDL 90 on statin  .  HLD (hyperlipidemia)   . Hypertension   . Hypothyroidism    11/18/15 TSH 53.6, 02/07/16 TSH 9.42 @ [facility]  . MDD (major depressive disorder)    With behavioral disturbance  . Scabies    02/21/16 [facility],[place]; Rx: Permethrin cream  . Vitamin D deficiency    11/18/15 vitamin D 25-hydroxy 39   Past Surgical History:  Procedure Laterality Date  . no record     04/10/15 [facility] note: see old chart    No Known Allergies  Outpatient Encounter Medications as of 02/16/2019  Medication Sig  . acetaminophen (TYLENOL) 325 MG tablet Take 650 mg by mouth every 6 (six) hours as needed for mild pain or moderate pain.  . ARIPiprazole (ABILIFY) 5 MG tablet Take 5 mg by mouth daily.  Marland Kitchen aspirin EC 81 MG tablet Take 81 mg by mouth daily.   Marland Kitchen atorvastatin (LIPITOR) 40 MG tablet Take 40 mg by mouth at bedtime.   . bisacodyl (DULCOLAX) 10 MG suppository If not relieved by MOM, give 10 mg Bisacodyl suppositiory rectally X 1 dose in 24 hours as needed (Do not use constipation standing orders for residents with renal failure/CFR less than 30. Contact MD for orders) (Physician Order)  . Calcium Carb-Cholecalciferol (CALCIUM-VITAMIN D3) 600-400 MG-UNIT TABS Take 1 tablet by mouth 2 (two) times daily.  . Cholecalciferol (VITAMIN D) 2000 units tablet Take 2,000 Units by mouth daily.   . divalproex (DEPAKOTE) 250 MG DR tablet Take 250 mg by mouth  2 (two) times daily.  Marland Kitchen donepezil (ARICEPT) 10 MG tablet Take 10 mg by mouth at bedtime.   . furosemide (LASIX) 20 MG tablet Take 20 mg by mouth every Monday, Wednesday, and Friday.   . levothyroxine (SYNTHROID) 200 MCG tablet Take 200 mcg by mouth daily before breakfast.   . levothyroxine (SYNTHROID) 25 MCG tablet Take 25 mcg by mouth daily before breakfast.  . magnesium hydroxide (MILK OF MAGNESIA) 400 MG/5ML suspension If no BM in 3 days, give 30 cc Milk of Magnesium p.o. x 1 dose in 24 hours as needed (Do not use standing constipation orders for  residents with renal failure CFR less than 30. Contact MD for orders) (Physician Order)  . memantine (NAMENDA) 10 MG tablet Take 10 mg by mouth 2 (two) times daily.   . Multiple Vitamin (MULTI-VITAMIN DAILY PO) Take one tablet by mouth once daily   . PARoxetine (PAXIL) 10 MG tablet Take 15 mg by mouth at bedtime. Take 1-1/2 tablets to = 15 mg qd  . sennosides-docusate sodium (SENOKOT-S) 8.6-50 MG tablet Take 2 tablets by mouth daily.   . Skin Protectants, Misc. (MINERIN) CREA Apply topically to ankles twice a day for dry skin.  . Sodium Phosphates (RA SALINE ENEMA RE) If not relieved by Biscodyl suppository, give disposable Saline Enema rectally X 1 dose/24 hrs as needed (Do not use constipation standing orders for residents with renal failure/CFR less than 30. Contact MD for orders)(Physician Or   No facility-administered encounter medications on file as of 02/16/2019.     Review of Systems  GENERAL: No change in appetite, no fatigue, no weight changes, no fever, chills or weakness MOUTH and THROAT: Denies oral discomfort, gingival pain or bleeding RESPIRATORY: no cough, SOB, DOE, wheezing, hemoptysis CARDIAC: No chest pain, edema or palpitations GI: No abdominal pain, diarrhea, constipation, heart burn, nausea or vomiting NEUROLOGICAL: Denies dizziness, syncope, numbness, or headache PSYCHIATRIC: Denies feelings of depression or anxiety. No report of hallucinations, insomnia, paranoia, or agitation   Immunization History  Administered Date(s) Administered  . Influenza-Unspecified 02/17/2016, 04/15/2017, 08/01/2018  . PPD Test 04/08/2016  . Pneumococcal Conjugate-13 01/11/2017  . Pneumococcal-Unspecified 02/16/2014   Pertinent  Health Maintenance Due  Topic Date Due  . INFLUENZA VACCINE  01/17/2019  . OPHTHALMOLOGY EXAM  05/08/2019 (Originally 10/12/1944)  . HEMOGLOBIN A1C  05/16/2019  . FOOT EXAM  06/10/2019  . PNA vac Low Risk Adult  Completed  . URINE MICROALBUMIN  Discontinued   . DEXA SCAN  Discontinued   Fall Risk  01/16/2019 01/14/2018 01/10/2017 08/07/2016 04/20/2016  Falls in the past year? 1 No No Yes Yes  Number falls in past yr: 1 - - 2 or more 2 or more  Injury with Fall? 0 - - - Yes  Risk Factor Category  - - - - High Fall Risk  Risk for fall due to : History of fall(s);Impaired balance/gait;Impaired mobility - - - -  Follow up Falls evaluation completed;Falls prevention discussed;Education provided - - - -     Vitals:   02/16/19 1603  BP: 105/63  Pulse: (!) 58  Resp: 18  Temp: (!) 97.4 F (36.3 C)  TempSrc: Oral  SpO2: 95%  Weight: 209 lb 12.8 oz (95.2 kg)  Height: 5\' 9"  (1.753 m)   Body mass index is 30.98 kg/m.  Physical Exam  GENERAL APPEARANCE: Well nourished. In no acute distress. Obese SKIN:  Skin is warm and dry.  MOUTH and THROAT: Lips are without lesions. Oral mucosa is  moist and without lesions. Tongue is normal in shape, size, and color and without lesions RESPIRATORY: Breathing is even & unlabored, BS CTAB CARDIAC: RRR, no murmur,no extra heart sounds, no edema GI: Abdomen soft, normal BS, no masses, no tenderness EXTREMITIES:  Able to move X 4 extremities NEUROLOGICAL: There is no tremor. Speech is clear. Alert to self, disoriented to time and place. PSYCHIATRIC: . Affect and behavior are appropriate  Labs reviewed: Recent Labs    08/22/18 09/01/18 11/01/18  NA 147 145 143  K 3.9 4.1 4.1  BUN 15 15 16   CREATININE 0.8 0.8 0.9   Recent Labs    02/18/18 03/04/18  AST 21 31  ALT 11 15  ALKPHOS 80 80   Recent Labs    06/20/18 08/22/18 11/01/18  WBC 3.9 3.6 4.8  NEUTROABS 1 1 1   HGB 12.5 12.2 12.9  HCT 37 36 40  PLT 126* 129* 162   Lab Results  Component Value Date   TSH 13.72 (A) 12/31/2018   Lab Results  Component Value Date   HGBA1C 6.2 11/13/2018   Lab Results  Component Value Date   CHOL 167 08/22/2018   HDL 49 08/22/2018   LDLCALC 106 08/22/2018   TRIG 58 08/22/2018     Assessment/Plan  1.  Occult blood positive stool - no reported bloody stool, will check CBC to check for change in hgb level, monitor for abdominal pain   Family/ staff Communication:  Discussed plan of care with resident.  Labs/tests ordered:  CBC  Goals of care:  Long-term care    Kenard GowerMonina Medina-Vargas, DNP, FNP-BC [facility] [phone] (Monday-Friday 8:00 a.m. - 5:00 p.m.) [phone] (after hours)

## 2019-02-17 DIAGNOSIS — R195 Other fecal abnormalities: Secondary | ICD-10-CM | POA: Insufficient documentation

## 2019-02-19 ENCOUNTER — Encounter: Payer: Self-pay | Admitting: Adult Health

## 2019-02-19 ENCOUNTER — Non-Acute Institutional Stay (SKILLED_NURSING_FACILITY): Payer: Medicare Other | Admitting: Adult Health

## 2019-02-19 DIAGNOSIS — E782 Mixed hyperlipidemia: Secondary | ICD-10-CM | POA: Diagnosis not present

## 2019-02-19 DIAGNOSIS — F02818 Dementia in other diseases classified elsewhere, unspecified severity, with other behavioral disturbance: Secondary | ICD-10-CM

## 2019-02-19 DIAGNOSIS — Z0000 Encounter for general adult medical examination without abnormal findings: Secondary | ICD-10-CM

## 2019-02-19 DIAGNOSIS — G308 Other Alzheimer's disease: Secondary | ICD-10-CM | POA: Diagnosis not present

## 2019-02-19 DIAGNOSIS — F0281 Dementia in other diseases classified elsewhere with behavioral disturbance: Secondary | ICD-10-CM | POA: Diagnosis not present

## 2019-02-19 DIAGNOSIS — F339 Major depressive disorder, recurrent, unspecified: Secondary | ICD-10-CM | POA: Diagnosis not present

## 2019-02-19 DIAGNOSIS — F39 Unspecified mood [affective] disorder: Secondary | ICD-10-CM | POA: Diagnosis not present

## 2019-02-19 DIAGNOSIS — E034 Atrophy of thyroid (acquired): Secondary | ICD-10-CM

## 2019-02-19 NOTE — Progress Notes (Signed)
Location:  [facility] Room Number: 214/B Place of Service:  SNF (31) Provider:  Kenard Gower, DNP, FNP-BC  Patient Care Team: Pecola Lawless, MD as PCP - General (Internal Medicine)  Extended Emergency Contact Information Primary Emergency Contact: Robinson,Roberto Address: [address]          [place], [place] [address] [place] Home Phone: [phone] Relation: Son Secondary Emergency Contact: Reid,Cynthia  [place] Mobile Phone: [phone] Relation: Daughter  Code Status:  Full Code  Goals of care: Advanced Directive information Advanced Directives 02/19/2019  Does Patient Have a Medical Advance Directive? Yes  Type of Advance Directive (No Data)  Does patient want to make changes to medical advance directive? No - Patient declined  Copy of Healthcare Power of Attorney in Chart? -  Would patient like information on creating a medical advance directive? -  Pre-existing out of facility DNR order (yellow form or pink MOST form) -     Chief Complaint  Patient presents with  . Medical Management of Chronic Issues    Routine visit of medical management  . Immunizations    T-Dap    HPI:  Pt is a 84 y.o. female seen today for medical management of chronic diseases.  She has PMH of dementia, hypothyroidism and hypertension.  BPs 124/68, 128/72, 110/70, 105/53. She is currently having restorative exercises for transfers and eating/swallowing. Resident has occasional agitation - pushed tray to the the floor and refused window visits.  She takes divalproex sodium for mood disorder and aripiprazole for depression   Past Medical History:  Diagnosis Date  . Chronic kidney disease    Stage III  . Dementia with behavioral disturbance (HCC) 03/19/2015   Notated on FL2 Form from Dr. Dreama Saa [phone]  . Depression    previously saw Dr Donell Beers  . Diabetes mellitus without complication (HCC)    11/18/15 A1c  6%  . Dyslipidemia    11/18/15 Tg 74, HDL 60, LDL 90 on statin  . HLD (hyperlipidemia)   . Hypertension   . Hypothyroidism    11/18/15 TSH 53.6, 02/07/16 TSH 9.42 @ [facility]  . MDD (major depressive disorder)    With behavioral disturbance  . Scabies    02/21/16 [facility],[place]; Rx: Permethrin cream  . Vitamin D deficiency    11/18/15 vitamin D 25-hydroxy 39   Past Surgical History:  Procedure Laterality Date  . no record     04/10/15 [facility] note: see old chart    No Known Allergies  Outpatient Encounter Medications as of 02/19/2019  Medication Sig  . acetaminophen (TYLENOL) 325 MG tablet Take 650 mg by mouth every 6 (six) hours as needed for mild pain or moderate pain.  . ARIPiprazole (ABILIFY) 5 MG tablet Take 5 mg by mouth daily.  Marland Kitchen aspirin EC 81 MG tablet Take 81 mg by mouth daily.   Marland Kitchen atorvastatin (LIPITOR) 40 MG tablet Take 40 mg by mouth at bedtime.   . bisacodyl (DULCOLAX) 10 MG suppository If not relieved by MOM, give 10 mg Bisacodyl suppositiory rectally X 1 dose in 24 hours as needed (Do not use constipation standing orders for residents with renal failure/CFR less than 30. Contact MD for orders) (Physician Order)  . Calcium Carb-Cholecalciferol (CALCIUM-VITAMIN D3) 600-400 MG-UNIT TABS Take 1 tablet by mouth 2 (two) times daily.  . Cholecalciferol (VITAMIN D) 2000 units tablet Take 2,000 Units by mouth daily.   . divalproex (DEPAKOTE) 250 MG DR tablet  Take 250 mg by mouth 2 (two) times daily.  Marland Kitchen. donepezil (ARICEPT) 10 MG tablet Take 10 mg by mouth at bedtime.   . furosemide (LASIX) 20 MG tablet Take 20 mg by mouth every Monday, Wednesday, and Friday.   . levothyroxine (SYNTHROID) 200 MCG tablet Take 200 mcg by mouth daily before breakfast.   . levothyroxine (SYNTHROID) 25 MCG tablet Take 25 mcg by mouth daily before breakfast.  . magnesium hydroxide (MILK OF MAGNESIA) 400 MG/5ML suspension If no BM in 3 days, give 30 cc Milk of Magnesium p.o. x 1 dose in 24  hours as needed (Do not use standing constipation orders for residents with renal failure CFR less than 30. Contact MD for orders) (Physician Order)  . memantine (NAMENDA) 10 MG tablet Take 10 mg by mouth 2 (two) times daily.   . Multiple Vitamin (MULTI-VITAMIN DAILY PO) Take one tablet by mouth once daily   . PARoxetine (PAXIL) 10 MG tablet Take 15 mg by mouth at bedtime. Take 1-1/2 tablets to = 15 mg qd  . sennosides-docusate sodium (SENOKOT-S) 8.6-50 MG tablet Take 2 tablets by mouth daily.   . Skin Protectants, Misc. (MINERIN) CREA Apply topically to ankles twice a day for dry skin.  . Sodium Phosphates (RA SALINE ENEMA RE) If not relieved by Biscodyl suppository, give disposable Saline Enema rectally X 1 dose/24 hrs as needed (Do not use constipation standing orders for residents with renal failure/CFR less than 30. Contact MD for orders)(Physician Or   No facility-administered encounter medications on file as of 02/19/2019.     Review of Systems  GENERAL: No change in appetite, no fatigue, no weight changes, no fever, chills or weakness MOUTH and THROAT: Denies oral discomfort, gingival pain or bleeding RESPIRATORY: no cough, SOB, DOE, wheezing, hemoptysis CARDIAC: No chest pain, or palpitations GI: No abdominal pain, diarrhea, constipation, heart burn, nausea or vomiting NEUROLOGICAL: Denies dizziness, syncope, numbness, or headache PSYCHIATRIC: Denies feelings of depression or anxiety. No report of hallucinations, insomnia, paranoia, or agitation   Immunization History  Administered Date(s) Administered  . Influenza-Unspecified 02/17/2016, 04/15/2017, 08/01/2018  . PPD Test 04/08/2016  . Pneumococcal Conjugate-13 01/11/2017  . Pneumococcal-Unspecified 02/16/2014   Pertinent  Health Maintenance Due  Topic Date Due  . INFLUENZA VACCINE  01/17/2019  . OPHTHALMOLOGY EXAM  05/08/2019 (Originally 10/12/1944)  . HEMOGLOBIN A1C  05/16/2019  . FOOT EXAM  06/10/2019  . PNA vac Low Risk  Adult  Completed  . URINE MICROALBUMIN  Discontinued  . DEXA SCAN  Discontinued   Fall Risk  01/16/2019 01/14/2018 01/10/2017 08/07/2016 04/20/2016  Falls in the past year? 1 No No Yes Yes  Number falls in past yr: 1 - - 2 or more 2 or more  Injury with Fall? 0 - - - Yes  Risk Factor Category  - - - - High Fall Risk  Risk for fall due to : History of fall(s);Impaired balance/gait;Impaired mobility - - - -  Follow up Falls evaluation completed;Falls prevention discussed;Education provided - - - -     Vitals:   02/19/19 1455  BP: 124/68  Pulse: 78  Resp: 20  Temp: 97.7 F (36.5 C)  TempSrc: Oral  SpO2: 95%  Weight: 209 lb 12.8 oz (95.2 kg)  Height: 5\' 9"  (1.753 m)   Body mass index is 30.98 kg/m.  Physical Exam  GENERAL APPEARANCE: Well nourished. In no acute distress. Obese SKIN:  Skin is warm and dry.  MOUTH and THROAT: Lips are without lesions. Oral  mucosa is moist and without lesions. Tongue is normal in shape, size, and color and without lesions RESPIRATORY: Breathing is even & unlabored, BS CTAB CARDIAC: RRR, no murmur,no extra heart sounds, BLE 1+ edema GI: Abdomen soft, normal BS, no masses, no tenderness EXTREMITIES:  Able to move X 4 extremities NEUROLOGICAL: There is no tremor. Speech is clear. Alert to self, disoriented to time and place. PSYCHIATRIC:  Affect and behavior are appropriate  Labs reviewed: Recent Labs    08/22/18 09/01/18 11/01/18  NA 147 145 143  K 3.9 4.1 4.1  BUN 15 15 16   CREATININE 0.8 0.8 0.9   Recent Labs    03/04/18  AST 31  ALT 15  ALKPHOS 80   Recent Labs    06/20/18 08/22/18 11/01/18  WBC 3.9 3.6 4.8  NEUTROABS 1 1 1   HGB 12.5 12.2 12.9  HCT 37 36 40  PLT 126* 129* 162   Lab Results  Component Value Date   TSH 13.72 (A) 12/31/2018   Lab Results  Component Value Date   HGBA1C 6.2 11/13/2018   Lab Results  Component Value Date   CHOL 167 08/22/2018   HDL 49 08/22/2018   LDLCALC 106 08/22/2018   TRIG 58 08/22/2018      Assessment/Plan  1. Healthcare maintenance -For Tdap 0.5 mL IM x1  2. Hypothyroidism due to acquired atrophy of thyroid -Continue levothyroxine  3. Mixed hyperlipidemia -Continue Lipitor  4. Mood disorder with psychosis ([place]) -Has occasional agitations, continue divalproex and aripiprazole, followed up by psych NP  5. Recurrent major depressive disorder, remission status unspecified ([place]) -Refused visitation visits, continue paroxetine  6. Alzheimer's disease of other onset with behavioral disturbance ([place]) -Continue memantine and donepezil, supportive care and fall precautions   Family/ staff Communication: Discussed plan of care with resident and charge nurse.  Labs/tests ordered: None  Goals of care:   Long-term care   Durenda Age, DNP, FNP-BC [facility] [phone] (Monday-Friday 8:00 a.m. - 5:00 p.m.) [phone] (after hours)

## 2019-02-26 DIAGNOSIS — Z0000 Encounter for general adult medical examination without abnormal findings: Secondary | ICD-10-CM | POA: Insufficient documentation

## 2019-03-02 DIAGNOSIS — E785 Hyperlipidemia, unspecified: Secondary | ICD-10-CM | POA: Diagnosis not present

## 2019-03-02 DIAGNOSIS — E039 Hypothyroidism, unspecified: Secondary | ICD-10-CM | POA: Diagnosis not present

## 2019-03-02 LAB — TSH: TSH: 5.82 (ref 0.41–5.90)

## 2019-03-20 ENCOUNTER — Non-Acute Institutional Stay (SKILLED_NURSING_FACILITY): Payer: Medicare Other | Admitting: Adult Health

## 2019-03-20 ENCOUNTER — Encounter: Payer: Self-pay | Admitting: Adult Health

## 2019-03-20 DIAGNOSIS — F0281 Dementia in other diseases classified elsewhere with behavioral disturbance: Secondary | ICD-10-CM

## 2019-03-20 DIAGNOSIS — E559 Vitamin D deficiency, unspecified: Secondary | ICD-10-CM

## 2019-03-20 DIAGNOSIS — I10 Essential (primary) hypertension: Secondary | ICD-10-CM | POA: Diagnosis not present

## 2019-03-20 DIAGNOSIS — G308 Other Alzheimer's disease: Secondary | ICD-10-CM

## 2019-03-20 DIAGNOSIS — E782 Mixed hyperlipidemia: Secondary | ICD-10-CM

## 2019-03-20 DIAGNOSIS — F39 Unspecified mood [affective] disorder: Secondary | ICD-10-CM

## 2019-03-20 DIAGNOSIS — F339 Major depressive disorder, recurrent, unspecified: Secondary | ICD-10-CM

## 2019-03-20 DIAGNOSIS — E034 Atrophy of thyroid (acquired): Secondary | ICD-10-CM

## 2019-03-20 NOTE — Progress Notes (Signed)
Location:  [facility] Room Number: 214/B Place of Service:  SNF (31) Provider:  Kenard Gower, DNP, FNP-BC  Patient Care Team: Pecola Lawless, MD as PCP - General (Internal Medicine)  Extended Emergency Contact Information Primary Emergency Contact: Robinson,Roberto Address: [address]          [place], [place] [address] [place] Home Phone: [phone] Relation: Son Secondary Emergency Contact: Reid,Cynthia  [place] Mobile Phone: [phone] Relation: Daughter  Code Status:  Full Code  Goals of care: Advanced Directive information Advanced Directives 03/20/2019  Does Patient Have a Medical Advance Directive? Yes  Type of Advance Directive (No Data)  Does patient want to make changes to medical advance directive? -  Copy of Healthcare Power of Attorney in Chart? -  Would patient like information on creating a medical advance directive? -  Pre-existing out of facility DNR order (yellow form or pink MOST form) -     Chief Complaint  Patient presents with  . Medical Management of Chronic Issues    Routine visit of medical management    HPI:  Pt is a 84 y.o. female seen today for medical management of chronic diseases. She has PMH of dementia,, hypothyroidism and hypertension. She is currently having restorative eating/swallowing and transfers. She continues to have episodes of refusal and resisting care. She takes Aripiprazole for depression with psychosis. Latest tsh 5.82 (9/14). Trending down from tsh 13.72 (7/15)  and 7.78 (5/28). She takes Levothyroxine daily for hypothyroidism.   Past Medical History:  Diagnosis Date  . Chronic kidney disease    Stage III  . Dementia with behavioral disturbance (HCC) 03/19/2015   Notated on FL2 Form from Dr. Dreama Saa [phone]  . Depression    previously saw Dr Donell Beers  . Diabetes mellitus without complication (HCC)    11/18/15 A1c 6%  . Dyslipidemia    11/18/15 Tg 74, HDL 60, LDL 90 on statin  . HLD (hyperlipidemia)   . Hypertension   . Hypothyroidism    11/18/15 TSH 53.6, 02/07/16 TSH 9.42 @ [facility]  . MDD (major depressive disorder)    With behavioral disturbance  . Scabies    02/21/16 [facility],[place]; Rx: Permethrin cream  . Vitamin D deficiency    11/18/15 vitamin D 25-hydroxy 39   Past Surgical History:  Procedure Laterality Date  . no record     04/10/15 [facility] note: see old chart    No Known Allergies  Outpatient Encounter Medications as of 03/20/2019  Medication Sig  . acetaminophen (TYLENOL) 325 MG tablet Take 650 mg by mouth every 6 (six) hours as needed for mild pain or moderate pain.  . ARIPiprazole (ABILIFY) 5 MG tablet Take 5 mg by mouth daily.  Marland Kitchen aspirin EC 81 MG tablet Take 81 mg by mouth daily.   Marland Kitchen atorvastatin (LIPITOR) 40 MG tablet Take 40 mg by mouth at bedtime.   . bisacodyl (DULCOLAX) 10 MG suppository If not relieved by MOM, give 10 mg Bisacodyl suppositiory rectally X 1 dose in 24 hours as needed (Do not use constipation standing orders for residents with renal failure/CFR less than 30. Contact MD for orders) (Physician Order)  . Calcium Carb-Cholecalciferol (CALCIUM-VITAMIN D3) 600-400 MG-UNIT TABS Take 1 tablet by mouth 2 (two) times daily.  . Cholecalciferol (VITAMIN D) 2000 units tablet Take 2,000 Units by mouth daily.   . divalproex (DEPAKOTE) 250 MG DR tablet Take 250 mg by mouth 2 (two) times daily.  Marland Kitchen  donepezil (ARICEPT) 10 MG tablet Take 10 mg by mouth at bedtime.   . furosemide (LASIX) 20 MG tablet Take 20 mg by mouth every Monday, Wednesday, and Friday.   . levothyroxine (SYNTHROID) 200 MCG tablet Take 200 mcg by mouth daily before breakfast.   . levothyroxine (SYNTHROID) 25 MCG tablet Take 25 mcg by mouth daily before breakfast.  . magnesium hydroxide (MILK OF MAGNESIA) 400 MG/5ML suspension If no BM in 3 days, give 30 cc Milk of Magnesium p.o. x 1 dose in 24 hours as needed (Do not  use standing constipation orders for residents with renal failure CFR less than 30. Contact MD for orders) (Physician Order)  . memantine (NAMENDA) 10 MG tablet Take 10 mg by mouth 2 (two) times daily.   . Multiple Vitamin (MULTI-VITAMIN DAILY PO) Take one tablet by mouth once daily   . PARoxetine (PAXIL) 10 MG tablet Take 15 mg by mouth at bedtime. Take 1-1/2 tablets to = 15 mg qd  . sennosides-docusate sodium (SENOKOT-S) 8.6-50 MG tablet Take 2 tablets by mouth daily.   . Skin Protectants, Misc. (MINERIN) CREA Apply topically to ankles twice a day for dry skin.  . Sodium Phosphates (RA SALINE ENEMA RE) If not relieved by Biscodyl suppository, give disposable Saline Enema rectally X 1 dose/24 hrs as needed (Do not use constipation standing orders for residents with renal failure/CFR less than 30. Contact MD for orders)(Physician Or   No facility-administered encounter medications on file as of 03/20/2019.     Review of Systems  GENERAL: No change in appetite, no fatigue, no weight changes, no fever, chills or weakness MOUTH and THROAT: Denies oral discomfort, gingival pain or bleeding RESPIRATORY: no cough, SOB, DOE, wheezing, hemoptysis CARDIAC: No chest pain,  or palpitations GI: No abdominal pain, diarrhea, constipation, heart burn, nausea or vomiting GU: Denies dysuria, frequency, hematuria or discharge NEUROLOGICAL: Denies dizziness, syncope, numbness, or headache PSYCHIATRIC: Denies feelings of depression or anxiety. No report of hallucinations, insomnia, paranoia, or agitation   Immunization History  Administered Date(s) Administered  . Influenza-Unspecified 02/17/2016, 04/15/2017, 08/01/2018  . PPD Test 04/08/2016  . Pneumococcal Conjugate-13 01/11/2017  . Pneumococcal-Unspecified 02/16/2014   Pertinent  Health Maintenance Due  Topic Date Due  . OPHTHALMOLOGY EXAM  05/08/2019 (Originally 10/12/1944)  . HEMOGLOBIN A1C  05/16/2019  . FOOT EXAM  06/10/2019  . INFLUENZA VACCINE   Completed  . PNA vac Low Risk Adult  Completed  . URINE MICROALBUMIN  Discontinued  . DEXA SCAN  Discontinued   Fall Risk  01/16/2019 01/14/2018 01/10/2017 08/07/2016 04/20/2016  Falls in the past year? 1 No No Yes Yes  Number falls in past yr: 1 - - 2 or more 2 or more  Injury with Fall? 0 - - - Yes  Risk Factor Category  - - - - High Fall Risk  Risk for fall due to : History of fall(s);Impaired balance/gait;Impaired mobility - - - -  Follow up Falls evaluation completed;Falls prevention discussed;Education provided - - - -     Vitals:   03/20/19 1645  BP: 126/74  Pulse: 80  Resp: 18  Temp: (!) 97.3 F (36.3 C)  SpO2: 95%  Weight: 205 lb 9.6 oz (93.3 kg)  Height: 5\' 9"  (1.753 m)   Body mass index is 30.36 kg/m.  Physical Exam  GENERAL APPEARANCE: Well nourished. In no acute distress. Obese SKIN:  Skin is warm and dry.  MOUTH and THROAT: Lips are without lesions. Oral mucosa is moist and without  lesions. Tongue is normal in shape, size, and color and without lesions RESPIRATORY: Breathing is even & unlabored, BS CTAB CARDIAC: RRR, no murmur,no extra heart sounds, BLE 1+ edema GI: Abdomen soft, normal BS, no masses, no tenderness EXTREMITIES:  Able to move X 4 extremities NEUROLOGICAL: There is no tremor. Speech is clear. Alert to self. Disoriented to time and place. PSYCHIATRIC:  Affect and behavior are appropriate  Labs reviewed: Recent Labs    08/22/18 09/01/18 11/01/18  NA 147 145 143  K 3.9 4.1 4.1  BUN 15 15 16   CREATININE 0.8 0.8 0.9    Recent Labs    06/20/18 08/22/18 11/01/18  WBC 3.9 3.6 4.8  NEUTROABS 1 1 1   HGB 12.5 12.2 12.9  HCT 37 36 40  PLT 126* 129* 162   Lab Results  Component Value Date   TSH 13.72 (A) 12/31/2018   Lab Results  Component Value Date   HGBA1C 6.2 11/13/2018   Lab Results  Component Value Date   CHOL 167 08/22/2018   HDL 49 08/22/2018   LDLCALC 106 08/22/2018   TRIG 58 08/22/2018     Assessment/Plan  1. Essential  hypertension -Well-controlled, continue Lasix  2. Vitamin D deficiency -Continue vitamin D3  3. Mixed hyperlipidemia Lab Results  Component Value Date   CHOL 167 08/22/2018   HDL 49 08/22/2018   LDLCALC 106 08/22/2018   TRIG 58 08/22/2018  -Continue Lipitor  4. Hypothyroidism due to acquired atrophy of thyroid -TSH trending down, continue levothyroxine  5. Mood disorder with psychosis ([place]) -Has.  Self refusal/resisting care, continue divalproex sodium and aripiprazole  6. Recurrent major depressive disorder, remission status unspecified ([place]) -Continue paroxetine, followed up by psych NP  7. Alzheimer's disease of other onset with behavioral disturbance ([place]) -Continue donepezil and memantine    Family/ staff Communication:  Discussed plan of care with resident.  Labs/tests ordered:  None  Goals of care:   Long-term care   Durenda Age, DNP, FNP-BC [facility] [phone] (Monday-Friday 8:00 a.m. - 5:00 p.m.) [phone] (after hours)

## 2019-03-24 DIAGNOSIS — E559 Vitamin D deficiency, unspecified: Secondary | ICD-10-CM | POA: Insufficient documentation

## 2019-03-31 DIAGNOSIS — M6281 Muscle weakness (generalized): Secondary | ICD-10-CM | POA: Diagnosis not present

## 2019-03-31 DIAGNOSIS — R1311 Dysphagia, oral phase: Secondary | ICD-10-CM | POA: Diagnosis not present

## 2019-04-01 DIAGNOSIS — M6281 Muscle weakness (generalized): Secondary | ICD-10-CM | POA: Diagnosis not present

## 2019-04-01 DIAGNOSIS — R1311 Dysphagia, oral phase: Secondary | ICD-10-CM | POA: Diagnosis not present

## 2019-04-02 ENCOUNTER — Encounter: Payer: Self-pay | Admitting: Adult Health

## 2019-04-02 ENCOUNTER — Non-Acute Institutional Stay (SKILLED_NURSING_FACILITY): Payer: Medicare Other | Admitting: Adult Health

## 2019-04-02 DIAGNOSIS — I10 Essential (primary) hypertension: Secondary | ICD-10-CM

## 2019-04-02 DIAGNOSIS — G308 Other Alzheimer's disease: Secondary | ICD-10-CM | POA: Diagnosis not present

## 2019-04-02 DIAGNOSIS — F339 Major depressive disorder, recurrent, unspecified: Secondary | ICD-10-CM

## 2019-04-02 DIAGNOSIS — E559 Vitamin D deficiency, unspecified: Secondary | ICD-10-CM | POA: Diagnosis not present

## 2019-04-02 DIAGNOSIS — F0281 Dementia in other diseases classified elsewhere with behavioral disturbance: Secondary | ICD-10-CM

## 2019-04-02 DIAGNOSIS — M6281 Muscle weakness (generalized): Secondary | ICD-10-CM | POA: Diagnosis not present

## 2019-04-02 DIAGNOSIS — F02818 Dementia in other diseases classified elsewhere, unspecified severity, with other behavioral disturbance: Secondary | ICD-10-CM

## 2019-04-02 DIAGNOSIS — E782 Mixed hyperlipidemia: Secondary | ICD-10-CM

## 2019-04-02 DIAGNOSIS — E034 Atrophy of thyroid (acquired): Secondary | ICD-10-CM

## 2019-04-02 DIAGNOSIS — F39 Unspecified mood [affective] disorder: Secondary | ICD-10-CM

## 2019-04-02 DIAGNOSIS — R1311 Dysphagia, oral phase: Secondary | ICD-10-CM | POA: Diagnosis not present

## 2019-04-02 DIAGNOSIS — Z7189 Other specified counseling: Secondary | ICD-10-CM

## 2019-04-02 NOTE — Progress Notes (Signed)
Location:  [facility] Room Number: 214/B Place of Service:  SNF (31) Provider:  Kenard Gower, DNP, FNP-BC  Patient Care Team: Pecola Lawless, MD as PCP - General (Internal Medicine)  Extended Emergency Contact Information Primary Emergency Contact: Robinson,Roberto Address: [address]          [place], [place] [address] [place] of [place] Home Phone: [phone] Relation: Son Secondary Emergency Contact: Reid,Cynthia  [place] Mobile Phone: [phone] Relation: Daughter  Code Status:  Full Code  Goals of care: Advanced Directive information Advanced Directives 04/02/2019  Does Patient Have a Medical Advance Directive? Yes  Type of Advance Directive (No Data)  Does patient want to make changes to medical advance directive? -  Copy of Healthcare Power of Attorney in Chart? -  Would patient like information on creating a medical advance directive? -  Pre-existing out of facility DNR order (yellow form or pink MOST form) -     Chief Complaint  Patient presents with  . Advanced Directive    Care Plan Meeting    HPI:  Pt is a 84 y.o. female seen today for advance care planning. Meeting was attended by resident, NP, MDS Coordinator and Child psychotherapist. Son, Mr. Gordy Levan "Bug" attended meeting via telephone conference. Medications, vital signs, weights, code status and care plan discussed. She remains to be full code and needing assistance by staff with ADL care. Resistance to care is still observed. She takes  Divalproex for mood disorder and Aripiprazole for Depression with psychosis. Son inquired regarding window visits. The meeting lasted for 20 minutes.   Past Medical History:  Diagnosis Date  . Chronic kidney disease    Stage III  . Dementia with behavioral disturbance (HCC) 03/19/2015   Notated on FL2 Form from Dr. Dreama Saa [phone]  . Depression    previously saw Dr Donell Beers  . Diabetes mellitus without  complication (HCC)    11/18/15 A1c 6%  . Dyslipidemia    11/18/15 Tg 74, HDL 60, LDL 90 on statin  . HLD (hyperlipidemia)   . Hypertension   . Hypothyroidism    11/18/15 TSH 53.6, 02/07/16 TSH 9.42 @ [facility]  . MDD (major depressive disorder)    With behavioral disturbance  . Scabies    02/21/16 [facility],[place]; Rx: Permethrin cream  . Vitamin D deficiency    11/18/15 vitamin D 25-hydroxy 39   Past Surgical History:  Procedure Laterality Date  . no record     04/10/15 [facility] note: see old chart    No Known Allergies  Outpatient Encounter Medications as of 04/02/2019  Medication Sig  . acetaminophen (TYLENOL) 325 MG tablet Take 650 mg by mouth every 6 (six) hours as needed for mild pain or moderate pain.  . ARIPiprazole (ABILIFY) 5 MG tablet Take 5 mg by mouth daily.  Marland Kitchen aspirin EC 81 MG tablet Take 81 mg by mouth daily.   Marland Kitchen atorvastatin (LIPITOR) 40 MG tablet Take 40 mg by mouth at bedtime.   . bisacodyl (DULCOLAX) 10 MG suppository If not relieved by MOM, give 10 mg Bisacodyl suppositiory rectally X 1 dose in 24 hours as needed (Do not use constipation standing orders for residents with renal failure/CFR less than 30. Contact MD for orders) (Physician Order)  . Calcium Carb-Cholecalciferol (CALCIUM-VITAMIN D3) 600-400 MG-UNIT TABS Take 1 tablet by mouth 2 (two) times daily.  . Cholecalciferol (VITAMIN D) 2000 units tablet Take 2,000 Units by mouth daily.   . divalproex (  DEPAKOTE) 250 MG DR tablet Take 250 mg by mouth 2 (two) times daily.  Marland Kitchen donepezil (ARICEPT) 10 MG tablet Take 10 mg by mouth at bedtime.   . furosemide (LASIX) 20 MG tablet Take 20 mg by mouth every Monday, Wednesday, and Friday.   . levothyroxine (SYNTHROID) 200 MCG tablet Take 200 mcg by mouth daily before breakfast.   . levothyroxine (SYNTHROID) 25 MCG tablet Take 25 mcg by mouth daily before breakfast.  . magnesium hydroxide (MILK OF MAGNESIA) 400 MG/5ML suspension If no BM in 3 days, give 30 cc Milk  of Magnesium p.o. x 1 dose in 24 hours as needed (Do not use standing constipation orders for residents with renal failure CFR less than 30. Contact MD for orders) (Physician Order)  . memantine (NAMENDA) 10 MG tablet Take 10 mg by mouth 2 (two) times daily.   . Multiple Vitamin (MULTI-VITAMIN DAILY PO) Take one tablet by mouth once daily   . PARoxetine (PAXIL) 10 MG tablet Take 15 mg by mouth at bedtime. Take 1-1/2 tablets to = 15 mg qd  . sennosides-docusate sodium (SENOKOT-S) 8.6-50 MG tablet Take 2 tablets by mouth daily.   . Skin Protectants, Misc. (MINERIN) CREA Apply topically to ankles twice a day for dry skin.  . Sodium Phosphates (RA SALINE ENEMA RE) If not relieved by Biscodyl suppository, give disposable Saline Enema rectally X 1 dose/24 hrs as needed (Do not use constipation standing orders for residents with renal failure/CFR less than 30. Contact MD for orders)(Physician Or   No facility-administered encounter medications on file as of 04/02/2019.     Review of Systems  GENERAL: No change in appetite, no fatigue, no weight changes, no fever, chills or weakness MOUTH and THROAT: Denies oral discomfort, gingival pain  RESPIRATORY: no cough, SOB, DOE, wheezing, hemoptysis CARDIAC: No chest pain, edema or palpitations GI: No abdominal pain, diarrhea, constipation, heart burn, nausea or vomiting GU: Denies dysuria, frequency, hematuria or discharge NEUROLOGICAL: Denies dizziness, syncope, numbness, or headache PSYCHIATRIC: Denies feelings of depression or anxiety. No report of hallucinations, insomnia, paranoia, or agitation    Immunization History  Administered Date(s) Administered  . Influenza-Unspecified 02/17/2016, 04/15/2017, 08/01/2018, 03/18/2019  . PPD Test 04/08/2016  . Pneumococcal Conjugate-13 01/11/2017  . Pneumococcal-Unspecified 02/16/2014  . Tdap 02/27/2019   Pertinent  Health Maintenance Due  Topic Date Due  . OPHTHALMOLOGY EXAM  05/08/2019 (Originally  10/12/1944)  . HEMOGLOBIN A1C  05/16/2019  . FOOT EXAM  06/10/2019  . INFLUENZA VACCINE  Completed  . PNA vac Low Risk Adult  Completed  . URINE MICROALBUMIN  Discontinued  . DEXA SCAN  Discontinued   Fall Risk  01/16/2019 01/14/2018 01/10/2017 08/07/2016 04/20/2016  Falls in the past year? 1 No No Yes Yes  Number falls in past yr: 1 - - 2 or more 2 or more  Injury with Fall? 0 - - - Yes  Risk Factor Category  - - - - High Fall Risk  Risk for fall due to : History of fall(s);Impaired balance/gait;Impaired mobility - - - -  Follow up Falls evaluation completed;Falls prevention discussed;Education provided - - - -     Vitals:   04/02/19 1209  BP: 134/70  Pulse: 87  Resp: 20  Temp: 97.7 F (36.5 C)  TempSrc: Oral  SpO2: 95%  Weight: 205 lb 9.6 oz (93.3 kg)  Height: 5\' 9"  (1.753 m)   Body mass index is 30.36 kg/m.  Physical Exam  GENERAL APPEARANCE: Well nourished. In no acute  distress. Obese SKIN:  Skin is warm and dry.  MOUTH and THROAT: Lips are without lesions. Oral mucosa is moist and without lesions. Tongue is normal in shape, size, and color and without lesions RESPIRATORY: Breathing is even & unlabored, BS CTAB CARDIAC: RRR, no murmur,no extra heart sounds GI: Abdomen soft, normal BS, no masses, no tenderness EXTREMITIES:  Able to move X 4 extremities NEUROLOGICAL: There is no tremor. Speech is clear. Alert to self, disoriented to time and place. PSYCHIATRIC:  Affect and behavior are appropriate  Labs reviewed: Recent Labs    08/22/18 09/01/18 11/01/18  NA 147 145 143  K 3.9 4.1 4.1  BUN 15 15 16   CREATININE 0.8 0.8 0.9    Recent Labs    06/20/18 08/22/18 11/01/18  WBC 3.9 3.6 4.8  NEUTROABS 1 1 1   HGB 12.5 12.2 12.9  HCT 37 36 40  PLT 126* 129* 162   Lab Results  Component Value Date   TSH 13.72 (A) 12/31/2018   Lab Results  Component Value Date   HGBA1C 6.2 11/13/2018   Lab Results  Component Value Date   CHOL 167 08/22/2018   HDL 49 08/22/2018    LDLCALC 106 08/22/2018   TRIG 58 08/22/2018     Assessment/Plan  1. Advance care planning - remains to be full code, discussed medications, vital signs and weights  2. Essential hypertension - stable, BP 134/70, continue Lasix 20 mg Q MWF  3. Hypothyroidism due to acquired atrophy of thyroid tsh 5.82, 03/02/19 - continue Levothyroxine  4. Mixed hyperlipidemia Lab Results  Component Value Date   CHOL 167 08/22/2018   CHOL 143 10/04/2016   CHOL 230 (A) 05/17/2016   Lab Results  Component Value Date   HDL 49 08/22/2018   HDL 48 10/04/2016   HDL 65 05/17/2016   Lab Results  Component Value Date   LDLCALC 106 08/22/2018   LDLCALC 80 10/04/2016   LDLCALC 147 05/17/2016   Lab Results  Component Value Date   TRIG 58 08/22/2018   TRIG 77 10/04/2016   TRIG 91 05/17/2016  - continue Lipitor  5. Mood disorder with psychosis (HCC) - continues to have  ocasional resistance to care, continue Divalproex sodium and Aripiprazole, followed up by psych NP   6. Recurrent major depressive disorder, remission status unspecified (HCC) - continue Paroxetine  7. Vitamin D deficiency - continue Vitamin D3 daily  8. Alzheimer's disease of other onset with behavioral disturbance (HCC) - continue Aricept and Memantine     Family/ staff Communication:  Discussed plan of care with resident.  Labs/tests ordered:  None  Goals of care:   Long-term care   Kenard GowerMonina Medina-Vargas, DNP, FNP-BC [facility] [phone] (Monday-Friday 8:00 a.m. - 5:00 p.m.) [phone] (after hours)

## 2019-04-03 DIAGNOSIS — R1311 Dysphagia, oral phase: Secondary | ICD-10-CM | POA: Diagnosis not present

## 2019-04-03 DIAGNOSIS — M6281 Muscle weakness (generalized): Secondary | ICD-10-CM | POA: Diagnosis not present

## 2019-04-06 DIAGNOSIS — M6281 Muscle weakness (generalized): Secondary | ICD-10-CM | POA: Diagnosis not present

## 2019-04-06 DIAGNOSIS — R1311 Dysphagia, oral phase: Secondary | ICD-10-CM | POA: Diagnosis not present

## 2019-04-07 DIAGNOSIS — M6281 Muscle weakness (generalized): Secondary | ICD-10-CM | POA: Diagnosis not present

## 2019-04-07 DIAGNOSIS — R1311 Dysphagia, oral phase: Secondary | ICD-10-CM | POA: Diagnosis not present

## 2019-04-07 DIAGNOSIS — J069 Acute upper respiratory infection, unspecified: Secondary | ICD-10-CM | POA: Diagnosis not present

## 2019-04-08 DIAGNOSIS — R1311 Dysphagia, oral phase: Secondary | ICD-10-CM | POA: Diagnosis not present

## 2019-04-08 DIAGNOSIS — M6281 Muscle weakness (generalized): Secondary | ICD-10-CM | POA: Diagnosis not present

## 2019-04-09 DIAGNOSIS — R1311 Dysphagia, oral phase: Secondary | ICD-10-CM | POA: Diagnosis not present

## 2019-04-09 DIAGNOSIS — M6281 Muscle weakness (generalized): Secondary | ICD-10-CM | POA: Diagnosis not present

## 2019-04-10 DIAGNOSIS — M6281 Muscle weakness (generalized): Secondary | ICD-10-CM | POA: Diagnosis not present

## 2019-04-10 DIAGNOSIS — R1311 Dysphagia, oral phase: Secondary | ICD-10-CM | POA: Diagnosis not present

## 2019-04-13 DIAGNOSIS — M6281 Muscle weakness (generalized): Secondary | ICD-10-CM | POA: Diagnosis not present

## 2019-04-13 DIAGNOSIS — R1311 Dysphagia, oral phase: Secondary | ICD-10-CM | POA: Diagnosis not present

## 2019-04-14 ENCOUNTER — Encounter: Payer: Self-pay | Admitting: Adult Health

## 2019-04-14 ENCOUNTER — Non-Acute Institutional Stay (SKILLED_NURSING_FACILITY): Payer: Medicare Other | Admitting: Adult Health

## 2019-04-14 DIAGNOSIS — J069 Acute upper respiratory infection, unspecified: Secondary | ICD-10-CM | POA: Diagnosis not present

## 2019-04-14 DIAGNOSIS — F419 Anxiety disorder, unspecified: Secondary | ICD-10-CM | POA: Diagnosis not present

## 2019-04-14 DIAGNOSIS — F39 Unspecified mood [affective] disorder: Secondary | ICD-10-CM | POA: Diagnosis not present

## 2019-04-14 DIAGNOSIS — F339 Major depressive disorder, recurrent, unspecified: Secondary | ICD-10-CM | POA: Diagnosis not present

## 2019-04-14 DIAGNOSIS — M6281 Muscle weakness (generalized): Secondary | ICD-10-CM | POA: Diagnosis not present

## 2019-04-14 DIAGNOSIS — R1311 Dysphagia, oral phase: Secondary | ICD-10-CM | POA: Diagnosis not present

## 2019-04-14 MED ORDER — LORAZEPAM 0.5 MG PO TABS: 0.5000 mg | ORAL_TABLET | Freq: Two times a day (BID) | ORAL | 0 refills | Status: DC | PRN

## 2019-04-14 NOTE — Progress Notes (Signed)
Location:  [facility] Room Number: 214/B Place of Service:  SNF (31) Provider:  Durenda Age, DNP, FNP-BC  Patient Care Team: Hendricks Limes, MD as PCP - General (Internal Medicine)  Extended Emergency Contact Information Primary Emergency Contact: Robinson,Roberto Address: [address]          [place], [place] [address] [name] of [place] Phone: [phone] Relation: Son Secondary Emergency Contact: Reid,Cynthia  [place] Mobile Phone: [phone] Relation: Daughter  Code Status:  Full Code  Goals of care: Advanced Directive information Advanced Directives 04/14/2019  Does Patient Have a Medical Advance Directive? Yes  Type of Advance Directive (No Data)  Does patient want to make changes to medical advance directive? -  Copy of [place] in Chart? -  Would patient like information on creating a medical advance directive? -  Pre-existing out of facility DNR order (yellow form or pink MOST form) -     Chief Complaint  Patient presents with  . Acute Visit    Combative/ Kicking Staff    HPI:  Pt is a 84 y.o. female who was reported to be combative/kicking staff during care. Family, daughter, came to do a window visit. Several staff tried to get her to the window but she refused and wanted to be left alone. She was reported to let staff just change her diaper but refused her clothes to be changed. She refused to have urine sample taken. She has PMH of dementia, hypothyroidism and hypertension.    Past Medical History:  Diagnosis Date  . Chronic kidney disease    Stage III  . Dementia with behavioral disturbance ([place]) 03/19/2015   Notated on FL2 Form from Dr. Agapito Games [phone]  . Depression    previously saw Dr Casimiro Needle  . Diabetes mellitus without complication ([place])    08/21/34 A1c 6%  . Dyslipidemia    11/18/15 Tg 74, HDL 60, LDL 90 on statin  . HLD (hyperlipidemia)   .  Hypertension   . Hypothyroidism    11/18/15 TSH 53.6, 02/07/16 TSH 9.42 @ [facility]  . MDD (major depressive disorder)    With behavioral disturbance  . Scabies    02/21/16 [facility],[place]; Rx: Permethrin cream  . Vitamin D deficiency    11/18/15 vitamin D 25-hydroxy 39   Past Surgical History:  Procedure Laterality Date  . no record     04/10/15 [facility] note: see old chart    No Known Allergies  Outpatient Encounter Medications as of 04/14/2019  Medication Sig  . acetaminophen (TYLENOL) 325 MG tablet Take 650 mg by mouth every 6 (six) hours as needed for mild pain or moderate pain.  . ARIPiprazole (ABILIFY) 5 MG tablet Take 5 mg by mouth daily.  Marland Kitchen aspirin EC 81 MG tablet Take 81 mg by mouth daily.   Marland Kitchen atorvastatin (LIPITOR) 40 MG tablet Take 40 mg by mouth at bedtime.   . bisacodyl (DULCOLAX) 10 MG suppository If not relieved by MOM, give 10 mg Bisacodyl suppositiory rectally X 1 dose in 24 hours as needed (Do not use constipation standing orders for residents with renal failure/CFR less than 30. Contact MD for orders) (Physician Order)  . Calcium Carb-Cholecalciferol (CALCIUM-VITAMIN D3) 600-400 MG-UNIT TABS Take 1 tablet by mouth 2 (two) times daily.  . Cholecalciferol (VITAMIN D) 2000 units tablet Take 2,000 Units by mouth daily.   . divalproex (DEPAKOTE) 250 MG DR tablet Take 250 mg by mouth 2 (two)  times daily.  Marland Kitchen donepezil (ARICEPT) 10 MG tablet Take 10 mg by mouth at bedtime.   . furosemide (LASIX) 20 MG tablet Take 20 mg by mouth every Monday, Wednesday, and Friday.   . levothyroxine (SYNTHROID) 200 MCG tablet Take 200 mcg by mouth daily before breakfast.   . levothyroxine (SYNTHROID) 25 MCG tablet Take 25 mcg by mouth daily before breakfast.  . magnesium hydroxide (MILK OF MAGNESIA) 400 MG/5ML suspension If no BM in 3 days, give 30 cc Milk of Magnesium p.o. x 1 dose in 24 hours as needed (Do not use standing constipation orders for residents with renal failure CFR  less than 30. Contact MD for orders) (Physician Order)  . memantine (NAMENDA) 10 MG tablet Take 10 mg by mouth 2 (two) times daily.   . Multiple Vitamin (MULTI-VITAMIN DAILY PO) Take one tablet by mouth once daily   . PARoxetine (PAXIL) 10 MG tablet Take 15 mg by mouth at bedtime. Take 1-1/2 tablets to = 15 mg qd  . sennosides-docusate sodium (SENOKOT-S) 8.6-50 MG tablet Take 2 tablets by mouth daily.   . Skin Protectants, Misc. (MINERIN) CREA Apply topically to ankles twice a day for dry skin.  . Sodium Phosphates (RA SALINE ENEMA RE) If not relieved by Biscodyl suppository, give disposable Saline Enema rectally X 1 dose/24 hrs as needed (Do not use constipation standing orders for residents with renal failure/CFR less than 30. Contact MD for orders)(Physician Or  . [DISCONTINUED] LORazepam (ATIVAN) 0.5 MG tablet Take 1 tablet (0.5 mg total) by mouth 2 (two) times daily as needed for up to 14 days for anxiety.   No facility-administered encounter medications on file as of 04/14/2019.     Review of Systems  GENERAL: No change in appetite, no fatigue, no weight changes, no fever, chills or weakness SKIN: Denies rash, itching, wounds, ulcer sores, or nail abnormalities MOUTH and THROAT: Denies oral discomfort, gingival pain or bleeding, pain from teeth or hoarseness   RESPIRATORY: no cough, SOB, DOE, wheezing, hemoptysis CARDIAC: No chest pain, edema or palpitations GI: No abdominal pain, diarrhea, constipation, heart burn, nausea or vomiting GU: Denies dysuria, frequency, hematuria or discharge NEUROLOGICAL: Denies dizziness, syncope, numbness, or headache PSYCHIATRIC: +anxiety/agitation   Immunization History  Administered Date(s) Administered  . Influenza-Unspecified 02/17/2016, 04/15/2017, 08/01/2018, 03/18/2019  . PPD Test 04/08/2016  . Pneumococcal Conjugate-13 01/11/2017  . Pneumococcal-Unspecified 02/16/2014  . Tdap 02/27/2019   Pertinent  Health Maintenance Due  Topic Date  Due  . OPHTHALMOLOGY EXAM  05/08/2019 (Originally 10/12/1944)  . HEMOGLOBIN A1C  05/16/2019  . FOOT EXAM  06/10/2019  . INFLUENZA VACCINE  Completed  . PNA vac Low Risk Adult  Completed  . URINE MICROALBUMIN  Discontinued  . DEXA SCAN  Discontinued   Fall Risk  01/16/2019 01/14/2018 01/10/2017 08/07/2016 04/20/2016  Falls in the past year? 1 No No Yes Yes  Number falls in past yr: 1 - - 2 or more 2 or more  Injury with Fall? 0 - - - Yes  Risk Factor Category  - - - - High Fall Risk  Risk for fall due to : History of fall(s);Impaired balance/gait;Impaired mobility - - - -  Follow up Falls evaluation completed;Falls prevention discussed;Education provided - - - -     Vitals:   04/14/19 1434  BP: 134/74  Pulse: 76  Resp: 20  Temp: 97.8 F (36.6 C)  Weight: 205 lb 9.6 oz (93.3 kg)  Height: 5\' 9"  (1.753 m)   Body mass  index is 30.36 kg/m.  Physical Exam  GENERAL APPEARANCE: Well nourished. In no acute distress. Obese SKIN:  Skin is warm and dry.  MOUTH and THROAT: Lips are without lesions. Oral mucosa is moist and without lesions.  RESPIRATORY: Breathing is even & unlabored, BS CTAB CARDIAC: RRR, no murmur,no extra heart sounds GI: Abdomen soft, normal BS, no masses, no tenderness EXTREMITIES:  Able to move X 4 extremities NEUROLOGICAL: There is no tremor. Speech is clear. Alert to self, disoriented to time and place. PSYCHIATRIC:  Affect and behavior are appropriate  Labs reviewed: Recent Labs    08/22/18 09/01/18 11/01/18  NA 147 145 143  K 3.9 4.1 4.1  BUN 15 15 16   CREATININE 0.8 0.8 0.9   No results for input(s): AST, ALT, ALKPHOS, BILITOT, PROT, ALBUMIN in the last 8760 hours. Recent Labs    06/20/18 08/22/18 11/01/18  WBC 3.9 3.6 4.8  NEUTROABS 1 1 1   HGB 12.5 12.2 12.9  HCT 37 36 40  PLT 126* 129* 162   Lab Results  Component Value Date   TSH 13.72 (A) 12/31/2018   Lab Results  Component Value Date   HGBA1C 6.2 11/13/2018   Lab Results  Component  Value Date   CHOL 167 08/22/2018   HDL 49 08/22/2018   LDLCALC 106 08/22/2018   TRIG 58 08/22/2018     Assessment/Plan  1. Anxiety - will start on Ativan 0.5 mg  1 tab PO BID PRN, will need to monitor behavior  2. Mood disorder with psychosis (HCC) - continue Divalproex 250 mg BID and Aripiprazole 5 mg Q HS, follow up with Psych NP  3. Recurrent major depressive disorder, remission status unspecified (HCC) - continue Paroxetine 10 mg  1.5 tab = 15 mg daily   Family/ staff Communication:  Discussed plan of care with resident and charge nurse.   Labs/tests ordered:  None  Goals of care:   Long-term care   Kenard GowerMonina Medina-Vargas, DNP, FNP-BC [facility] [phone] (Monday-Friday 8:00 a.m. - 5:00 p.m.) [phone] (after hours)

## 2019-04-15 DIAGNOSIS — R1311 Dysphagia, oral phase: Secondary | ICD-10-CM | POA: Diagnosis not present

## 2019-04-15 DIAGNOSIS — M6281 Muscle weakness (generalized): Secondary | ICD-10-CM | POA: Diagnosis not present

## 2019-04-16 DIAGNOSIS — M6281 Muscle weakness (generalized): Secondary | ICD-10-CM | POA: Diagnosis not present

## 2019-04-16 DIAGNOSIS — R1311 Dysphagia, oral phase: Secondary | ICD-10-CM | POA: Diagnosis not present

## 2019-04-16 DIAGNOSIS — F419 Anxiety disorder, unspecified: Secondary | ICD-10-CM | POA: Insufficient documentation

## 2019-04-17 DIAGNOSIS — R1311 Dysphagia, oral phase: Secondary | ICD-10-CM | POA: Diagnosis not present

## 2019-04-17 DIAGNOSIS — E1151 Type 2 diabetes mellitus with diabetic peripheral angiopathy without gangrene: Secondary | ICD-10-CM | POA: Diagnosis not present

## 2019-04-17 DIAGNOSIS — M79675 Pain in left toe(s): Secondary | ICD-10-CM | POA: Diagnosis not present

## 2019-04-17 DIAGNOSIS — Z7984 Long term (current) use of oral hypoglycemic drugs: Secondary | ICD-10-CM | POA: Diagnosis not present

## 2019-04-17 DIAGNOSIS — M6281 Muscle weakness (generalized): Secondary | ICD-10-CM | POA: Diagnosis not present

## 2019-04-17 DIAGNOSIS — B351 Tinea unguium: Secondary | ICD-10-CM | POA: Diagnosis not present

## 2019-04-19 DIAGNOSIS — M6281 Muscle weakness (generalized): Secondary | ICD-10-CM | POA: Diagnosis not present

## 2019-04-19 DIAGNOSIS — R296 Repeated falls: Secondary | ICD-10-CM | POA: Diagnosis not present

## 2019-04-19 DIAGNOSIS — R1311 Dysphagia, oral phase: Secondary | ICD-10-CM | POA: Diagnosis not present

## 2019-04-19 DIAGNOSIS — F0391 Unspecified dementia with behavioral disturbance: Secondary | ICD-10-CM | POA: Diagnosis not present

## 2019-04-19 DIAGNOSIS — R2681 Unsteadiness on feet: Secondary | ICD-10-CM | POA: Diagnosis not present

## 2019-04-20 DIAGNOSIS — R1311 Dysphagia, oral phase: Secondary | ICD-10-CM | POA: Diagnosis not present

## 2019-04-20 DIAGNOSIS — R296 Repeated falls: Secondary | ICD-10-CM | POA: Diagnosis not present

## 2019-04-20 DIAGNOSIS — F0391 Unspecified dementia with behavioral disturbance: Secondary | ICD-10-CM | POA: Diagnosis not present

## 2019-04-20 DIAGNOSIS — M6281 Muscle weakness (generalized): Secondary | ICD-10-CM | POA: Diagnosis not present

## 2019-04-20 DIAGNOSIS — R2681 Unsteadiness on feet: Secondary | ICD-10-CM | POA: Diagnosis not present

## 2019-04-21 DIAGNOSIS — F0391 Unspecified dementia with behavioral disturbance: Secondary | ICD-10-CM | POA: Diagnosis not present

## 2019-04-21 DIAGNOSIS — M6281 Muscle weakness (generalized): Secondary | ICD-10-CM | POA: Diagnosis not present

## 2019-04-21 DIAGNOSIS — R2681 Unsteadiness on feet: Secondary | ICD-10-CM | POA: Diagnosis not present

## 2019-04-21 DIAGNOSIS — R1311 Dysphagia, oral phase: Secondary | ICD-10-CM | POA: Diagnosis not present

## 2019-04-21 DIAGNOSIS — R296 Repeated falls: Secondary | ICD-10-CM | POA: Diagnosis not present

## 2019-04-22 DIAGNOSIS — R1311 Dysphagia, oral phase: Secondary | ICD-10-CM | POA: Diagnosis not present

## 2019-04-22 DIAGNOSIS — R296 Repeated falls: Secondary | ICD-10-CM | POA: Diagnosis not present

## 2019-04-22 DIAGNOSIS — F0391 Unspecified dementia with behavioral disturbance: Secondary | ICD-10-CM | POA: Diagnosis not present

## 2019-04-22 DIAGNOSIS — M6281 Muscle weakness (generalized): Secondary | ICD-10-CM | POA: Diagnosis not present

## 2019-04-22 DIAGNOSIS — R2681 Unsteadiness on feet: Secondary | ICD-10-CM | POA: Diagnosis not present

## 2019-04-23 DIAGNOSIS — F0391 Unspecified dementia with behavioral disturbance: Secondary | ICD-10-CM | POA: Diagnosis not present

## 2019-04-23 DIAGNOSIS — R296 Repeated falls: Secondary | ICD-10-CM | POA: Diagnosis not present

## 2019-04-23 DIAGNOSIS — R2681 Unsteadiness on feet: Secondary | ICD-10-CM | POA: Diagnosis not present

## 2019-04-23 DIAGNOSIS — M6281 Muscle weakness (generalized): Secondary | ICD-10-CM | POA: Diagnosis not present

## 2019-04-23 DIAGNOSIS — R1311 Dysphagia, oral phase: Secondary | ICD-10-CM | POA: Diagnosis not present

## 2019-04-24 DIAGNOSIS — R1311 Dysphagia, oral phase: Secondary | ICD-10-CM | POA: Diagnosis not present

## 2019-04-24 DIAGNOSIS — R2681 Unsteadiness on feet: Secondary | ICD-10-CM | POA: Diagnosis not present

## 2019-04-24 DIAGNOSIS — F0391 Unspecified dementia with behavioral disturbance: Secondary | ICD-10-CM | POA: Diagnosis not present

## 2019-04-24 DIAGNOSIS — R296 Repeated falls: Secondary | ICD-10-CM | POA: Diagnosis not present

## 2019-04-24 DIAGNOSIS — M6281 Muscle weakness (generalized): Secondary | ICD-10-CM | POA: Diagnosis not present

## 2019-04-27 ENCOUNTER — Non-Acute Institutional Stay (SKILLED_NURSING_FACILITY): Payer: Medicare Other | Admitting: Adult Health

## 2019-04-27 ENCOUNTER — Encounter: Payer: Self-pay | Admitting: Adult Health

## 2019-04-27 DIAGNOSIS — F39 Unspecified mood [affective] disorder: Secondary | ICD-10-CM | POA: Diagnosis not present

## 2019-04-27 DIAGNOSIS — W19XXXA Unspecified fall, initial encounter: Secondary | ICD-10-CM

## 2019-04-27 DIAGNOSIS — R1311 Dysphagia, oral phase: Secondary | ICD-10-CM | POA: Diagnosis not present

## 2019-04-27 DIAGNOSIS — F419 Anxiety disorder, unspecified: Secondary | ICD-10-CM | POA: Diagnosis not present

## 2019-04-27 DIAGNOSIS — R296 Repeated falls: Secondary | ICD-10-CM | POA: Diagnosis not present

## 2019-04-27 DIAGNOSIS — R2681 Unsteadiness on feet: Secondary | ICD-10-CM | POA: Diagnosis not present

## 2019-04-27 DIAGNOSIS — M6281 Muscle weakness (generalized): Secondary | ICD-10-CM | POA: Diagnosis not present

## 2019-04-27 DIAGNOSIS — F0391 Unspecified dementia with behavioral disturbance: Secondary | ICD-10-CM | POA: Diagnosis not present

## 2019-04-27 NOTE — Progress Notes (Addendum)
Location:  [facility] Room Number: 214/B Place of Service:  SNF (31) Provider:  Kenard GowerMedina-Vargas, , DNP, FNP-BC  Patient Care Team: Pecola LawlessHopper, William F, MD as PCP - General (Internal Medicine)  Extended Emergency Contact Information Primary Emergency Contact: Robinson,Roberto Address: [address]          [place], [place] [address] [place] Home Phone: [phone] Relation: Son Secondary Emergency Contact: Reid,Cynthia  [place] Mobile Phone: [phone] Relation: Daughter  Code Status:  Full Code  Goals of care: Advanced Directive information Advanced Directives 04/27/2019  Does Patient Have a Medical Advance Directive? Yes  Type of Advance Directive (No Data)  Does patient want to make changes to medical advance directive? No - Patient declined  Copy of Healthcare Power of Attorney in Chart? -  Would patient like information on creating a medical advance directive? -  Pre-existing out of facility DNR order (yellow form or pink MOST form) -     Chief Complaint  Patient presents with  . Acute Visit    S/P Fall    HPI:  Pt is a 84 y.o. female who was seen on the floor on her right side, by her bedside. She was unable to tell staff what happened. She is able to doe ROM on all four extremities without difficulty. No skin tears nor bruising was noted. She was reported to have occasional yelling during care. She takes PRN Ativan for anxiety and Divalproex for mood disorder with psychosis. She has been a long-term care resident of [facility]. She has PMH of dementia, hypothyroidism and hypertension.  Past Medical History:  Diagnosis Date  . Chronic kidney disease    Stage III  . Dementia with behavioral disturbance (HCC) 03/19/2015   Notated on FL2 Form from Dr. Dreama SaaJoel Brass [phone]  . Depression    previously saw Dr Donell BeersPlovsky  . Diabetes mellitus without complication (HCC)    11/18/15 A1c  6%  . Dyslipidemia    11/18/15 Tg 74, HDL 60, LDL 90 on statin  . HLD (hyperlipidemia)   . Hypertension   . Hypothyroidism    11/18/15 TSH 53.6, 02/07/16 TSH 9.42 @ [facility]  . MDD (major depressive disorder)    With behavioral disturbance  . Scabies    02/21/16 [facility],[place]; Rx: Permethrin cream  . Vitamin D deficiency    11/18/15 vitamin D 25-hydroxy 39   Past Surgical History:  Procedure Laterality Date  . no record     04/10/15 [facility] note: see old chart    No Known Allergies  Outpatient Encounter Medications as of 04/27/2019  Medication Sig  . acetaminophen (TYLENOL) 325 MG tablet Take 650 mg by mouth every 6 (six) hours as needed for mild pain or moderate pain.  . ARIPiprazole (ABILIFY) 5 MG tablet Take 5 mg by mouth daily.  Marland Kitchen. aspirin EC 81 MG tablet Take 81 mg by mouth daily.   Marland Kitchen. atorvastatin (LIPITOR) 40 MG tablet Take 40 mg by mouth at bedtime.   . bisacodyl (DULCOLAX) 10 MG suppository If not relieved by MOM, give 10 mg Bisacodyl suppositiory rectally X 1 dose in 24 hours as needed (Do not use constipation standing orders for residents with renal failure/CFR less than 30. Contact MD for orders) (Physician Order)  . Calcium Carb-Cholecalciferol (CALCIUM-VITAMIN D3) 600-400 MG-UNIT TABS Take 1 tablet by mouth 2 (two) times daily.  . Cholecalciferol (VITAMIN D) 2000 units tablet Take 2,000 Units by mouth daily.   .Marland Kitchen  divalproex (DEPAKOTE) 250 MG DR tablet Take 250 mg by mouth 2 (two) times daily.  Marland Kitchen donepezil (ARICEPT) 10 MG tablet Take 10 mg by mouth at bedtime.   . furosemide (LASIX) 20 MG tablet Take 20 mg by mouth every Monday, Wednesday, and Friday.   . levothyroxine (SYNTHROID) 200 MCG tablet Take 200 mcg by mouth daily before breakfast.   . levothyroxine (SYNTHROID) 25 MCG tablet Take 25 mcg by mouth daily before breakfast.  . LORazepam (ATIVAN) 0.5 MG tablet Take 0.5 mg by mouth 2 (two) times daily.  . magnesium hydroxide (MILK OF MAGNESIA) 400 MG/5ML  suspension If no BM in 3 days, give 30 cc Milk of Magnesium p.o. x 1 dose in 24 hours as needed (Do not use standing constipation orders for residents with renal failure CFR less than 30. Contact MD for orders) (Physician Order)  . memantine (NAMENDA) 10 MG tablet Take 10 mg by mouth 2 (two) times daily.   . Multiple Vitamin (MULTI-VITAMIN DAILY PO) Take one tablet by mouth once daily   . PARoxetine (PAXIL) 10 MG tablet Take 15 mg by mouth at bedtime. Take 1-1/2 tablets to = 15 mg qd  . sennosides-docusate sodium (SENOKOT-S) 8.6-50 MG tablet Take 2 tablets by mouth daily.   . Skin Protectants, Misc. (MINERIN) CREA Apply topically to ankles twice a day for dry skin.  . Sodium Phosphates (RA SALINE ENEMA RE) If not relieved by Biscodyl suppository, give disposable Saline Enema rectally X 1 dose/24 hrs as needed (Do not use constipation standing orders for residents with renal failure/CFR less than 30. Contact MD for orders)(Physician Or   No facility-administered encounter medications on file as of 04/27/2019.     Review of Systems  GENERAL: No change in appetite, no fatigue, no weight changes, no fever, chills or weakness MOUTH and THROAT: Denies oral discomfort, gingival pain or bleeding, pain from teeth or hoarseness   RESPIRATORY: no cough, SOB, DOE, wheezing, hemoptysis CARDIAC: No chest pain, edema or palpitations GI: No abdominal pain, diarrhea, constipation, heart burn, nausea or vomiting NEUROLOGICAL: Denies dizziness, syncope, numbness, or headache PSYCHIATRIC: Denies feelings of depression or anxiety. No report of hallucinations, insomnia, paranoia, or agitation    Immunization History  Administered Date(s) Administered  . Influenza-Unspecified 02/17/2016, 04/15/2017, 08/01/2018, 03/18/2019  . PPD Test 04/08/2016  . Pneumococcal Conjugate-13 01/11/2017  . Pneumococcal-Unspecified 02/16/2014  . Tdap 02/27/2019   Pertinent  Health Maintenance Due  Topic Date Due  .  OPHTHALMOLOGY EXAM  05/08/2019 (Originally 10/12/1944)  . HEMOGLOBIN A1C  05/16/2019  . FOOT EXAM  06/10/2019  . INFLUENZA VACCINE  Completed  . PNA vac Low Risk Adult  Completed  . URINE MICROALBUMIN  Discontinued  . DEXA SCAN  Discontinued   Fall Risk  01/16/2019 01/14/2018 01/10/2017 08/07/2016 04/20/2016  Falls in the past year? 1 No No Yes Yes  Number falls in past yr: 1 - - 2 or more 2 or more  Injury with Fall? 0 - - - Yes  Risk Factor Category  - - - - High Fall Risk  Risk for fall due to : History of fall(s);Impaired balance/gait;Impaired mobility - - - -  Follow up Falls evaluation completed;Falls prevention discussed;Education provided - - - -     Vitals:   04/27/19 1633  BP: 122/66  Pulse: 68  Resp: 18  Temp: (!) 97 F (36.1 C)  TempSrc: Oral  SpO2: 95%  Weight: 204 lb 9.6 oz (92.8 kg)  Height: 5\' 9"  (1.753 m)  Body mass index is 30.21 kg/m.  Physical Exam  GENERAL APPEARANCE: Well nourished. In no acute distress. Obese SKIN:  Skin is warm and dry.  MOUTH and THROAT: Lips are without lesions. Oral mucosa is moist and without lesions. Tongue is normal in shape, size, and color and without lesions RESPIRATORY: Breathing is even & unlabored, BS CTAB CARDIAC: RRR, no murmur,no extra heart sounds GI: Abdomen soft, normal BS, no masses, no tenderness NEUROLOGICAL: There is no tremor. Speech is clear. Alert to self, disoriented to time and place. PSYCHIATRIC:  Affect and behavior are appropriate  Labs reviewed: Recent Labs    08/22/18 09/01/18 11/01/18  NA 147 145 143  K 3.9 4.1 4.1  BUN 15 15 16   CREATININE 0.8 0.8 0.9   No results for input(s): AST, ALT, ALKPHOS, BILITOT, PROT, ALBUMIN in the last 8760 hours. Recent Labs    06/20/18 08/22/18 11/01/18  WBC 3.9 3.6 4.8  NEUTROABS 1 1 1   HGB 12.5 12.2 12.9  HCT 37 36 40  PLT 126* 129* 162   Lab Results  Component Value Date   TSH 13.72 (A) 12/31/2018   Lab Results  Component Value Date   HGBA1C 6.2  11/13/2018   Lab Results  Component Value Date   CHOL 167 08/22/2018   HDL 49 08/22/2018   LDLCALC 106 08/22/2018   TRIG 58 08/22/2018     Assessment/Plan  1. Fall, initial encounter - no injury has been noted, will need to monitor resident more frequently especially at night, instructed resident to use call light to ask for help, monitor for ant tenderness or bruising  2. Anxiety - reported occasional yelling during care, will continue PRN Ativan  3. Mood disorder with psychosis - mood is stable, continue Divalproex DR, monitor behavior    Family/ staff Communication:  Discussed plan of care with resident.  Labs/tests ordered:  None  Goals of care: Long-term care     10/22/2018, DNP, FNP-BC [facility] and Adult Medicine [phone] (Monday-Friday 8:00 a.m. - 5:00 p.m.) [phone] (after hours)

## 2019-04-28 DIAGNOSIS — M6281 Muscle weakness (generalized): Secondary | ICD-10-CM | POA: Diagnosis not present

## 2019-04-28 DIAGNOSIS — F0391 Unspecified dementia with behavioral disturbance: Secondary | ICD-10-CM | POA: Diagnosis not present

## 2019-04-28 DIAGNOSIS — R296 Repeated falls: Secondary | ICD-10-CM | POA: Diagnosis not present

## 2019-04-28 DIAGNOSIS — R1311 Dysphagia, oral phase: Secondary | ICD-10-CM | POA: Diagnosis not present

## 2019-04-28 DIAGNOSIS — R2681 Unsteadiness on feet: Secondary | ICD-10-CM | POA: Diagnosis not present

## 2019-04-29 DIAGNOSIS — R2681 Unsteadiness on feet: Secondary | ICD-10-CM | POA: Diagnosis not present

## 2019-04-29 DIAGNOSIS — M6281 Muscle weakness (generalized): Secondary | ICD-10-CM | POA: Diagnosis not present

## 2019-04-29 DIAGNOSIS — R296 Repeated falls: Secondary | ICD-10-CM | POA: Diagnosis not present

## 2019-04-29 DIAGNOSIS — F0391 Unspecified dementia with behavioral disturbance: Secondary | ICD-10-CM | POA: Diagnosis not present

## 2019-04-29 DIAGNOSIS — R1311 Dysphagia, oral phase: Secondary | ICD-10-CM | POA: Diagnosis not present

## 2019-04-30 ENCOUNTER — Encounter: Payer: Self-pay | Admitting: Internal Medicine

## 2019-04-30 DIAGNOSIS — R296 Repeated falls: Secondary | ICD-10-CM | POA: Diagnosis not present

## 2019-04-30 DIAGNOSIS — F0391 Unspecified dementia with behavioral disturbance: Secondary | ICD-10-CM | POA: Diagnosis not present

## 2019-04-30 DIAGNOSIS — R1311 Dysphagia, oral phase: Secondary | ICD-10-CM | POA: Diagnosis not present

## 2019-04-30 DIAGNOSIS — M6281 Muscle weakness (generalized): Secondary | ICD-10-CM | POA: Diagnosis not present

## 2019-04-30 DIAGNOSIS — R2681 Unsteadiness on feet: Secondary | ICD-10-CM | POA: Diagnosis not present

## 2019-04-30 NOTE — Progress Notes (Signed)
   NURSING HOME LOCATION:  [facility] ROOM NUMBER:  214/B  CODE STATUS:  Full Code  PCP:  Hendricks Limes MD.   This is a nursing facility follow up for specific acute issue of  of chronic medical diagnoses  [place] readmission within 30 days  Interim medical record and care since last [place] visit was updated with review of diagnostic studies and change in clinical status since last visit were documented.  HPI:  Review of systems: Dementia invalidated responses. Date given as   Constitutional: No fever, significant weight change, fatigue  Eyes: No redness, discharge, pain, vision change ENT/mouth: No nasal congestion,  purulent discharge, earache, change in hearing, sore throat  Cardiovascular: No chest pain, palpitations, paroxysmal nocturnal dyspnea, claudication, edema  Respiratory: No cough, sputum production, hemoptysis, DOE, significant snoring, apnea   Gastrointestinal: No heartburn, dysphagia, abdominal pain, nausea /vomiting, rectal bleeding, melena, change in bowels Genitourinary: No dysuria, hematuria, pyuria, incontinence, nocturia Musculoskeletal: No joint stiffness, joint swelling, weakness, pain Dermatologic: No rash, pruritus, change in appearance of skin Neurologic: No dizziness, headache, syncope, seizures, numbness, tingling Psychiatric: No significant anxiety, depression, insomnia, anorexia Endocrine: No change in hair/skin/nails, excessive thirst, excessive hunger, excessive urination  Hematologic/lymphatic: No significant bruising, lymphadenopathy, abnormal bleeding Allergy/immunology: No itchy/watery eyes, significant sneezing, urticaria, angioedema  Physical exam:  Pertinent or positive findings: General appearance: Adequately nourished; no acute distress, increased work of breathing is present.   Lymphatic: No lymphadenopathy about the head, neck, axilla. Eyes: No conjunctival inflammation or lid edema is present. There  is no scleral icterus. Ears:  External ear exam shows no significant lesions or deformities.   Nose:  External nasal examination shows no deformity or inflammation. Nasal mucosa are pink and moist without lesions, exudates Oral exam:  Lips and gums are healthy appearing. There is no oropharyngeal erythema or exudate. Neck:  No thyromegaly, masses, tenderness noted.    Heart:  Normal rate and regular rhythm. S1 and S2 normal without gallop, murmur, click, rub .  Lungs: Chest clear to auscultation without wheezes, rhonchi, rales, rubs. Abdomen: Bowel sounds are normal. Abdomen is soft and nontender with no organomegaly, hernias, masses. GU: Deferred  Extremities:  No cyanosis, clubbing, edema  Neurologic exam : Cn 2-7 intact Strength equal  in upper & lower extremities Balance, Rhomberg, finger to nose testing could not be completed due to clinical state Deep tendon reflexes are equal Skin: Warm & dry w/o tenting. No significant lesions or rash.  See summary under each active problem in the Problem List with associated updated therapeutic plan    This encounter was created in error - please disregard.

## 2019-05-01 DIAGNOSIS — R1311 Dysphagia, oral phase: Secondary | ICD-10-CM | POA: Diagnosis not present

## 2019-05-01 DIAGNOSIS — R296 Repeated falls: Secondary | ICD-10-CM | POA: Diagnosis not present

## 2019-05-01 DIAGNOSIS — R2681 Unsteadiness on feet: Secondary | ICD-10-CM | POA: Diagnosis not present

## 2019-05-01 DIAGNOSIS — N390 Urinary tract infection, site not specified: Secondary | ICD-10-CM | POA: Diagnosis not present

## 2019-05-01 DIAGNOSIS — R319 Hematuria, unspecified: Secondary | ICD-10-CM | POA: Diagnosis not present

## 2019-05-01 DIAGNOSIS — Z79899 Other long term (current) drug therapy: Secondary | ICD-10-CM | POA: Diagnosis not present

## 2019-05-01 DIAGNOSIS — F0391 Unspecified dementia with behavioral disturbance: Secondary | ICD-10-CM | POA: Diagnosis not present

## 2019-05-01 DIAGNOSIS — M6281 Muscle weakness (generalized): Secondary | ICD-10-CM | POA: Diagnosis not present

## 2019-05-03 DIAGNOSIS — W19XXXA Unspecified fall, initial encounter: Secondary | ICD-10-CM | POA: Insufficient documentation

## 2019-05-04 ENCOUNTER — Encounter: Payer: Self-pay | Admitting: Adult Health

## 2019-05-04 ENCOUNTER — Non-Acute Institutional Stay (SKILLED_NURSING_FACILITY): Payer: Medicare Other | Admitting: Adult Health

## 2019-05-04 DIAGNOSIS — M6281 Muscle weakness (generalized): Secondary | ICD-10-CM | POA: Diagnosis not present

## 2019-05-04 DIAGNOSIS — F39 Unspecified mood [affective] disorder: Secondary | ICD-10-CM

## 2019-05-04 DIAGNOSIS — R296 Repeated falls: Secondary | ICD-10-CM | POA: Diagnosis not present

## 2019-05-04 DIAGNOSIS — F02818 Dementia in other diseases classified elsewhere, unspecified severity, with other behavioral disturbance: Secondary | ICD-10-CM

## 2019-05-04 DIAGNOSIS — G308 Other Alzheimer's disease: Secondary | ICD-10-CM

## 2019-05-04 DIAGNOSIS — N390 Urinary tract infection, site not specified: Secondary | ICD-10-CM

## 2019-05-04 DIAGNOSIS — F0391 Unspecified dementia with behavioral disturbance: Secondary | ICD-10-CM | POA: Diagnosis not present

## 2019-05-04 DIAGNOSIS — R2681 Unsteadiness on feet: Secondary | ICD-10-CM | POA: Diagnosis not present

## 2019-05-04 DIAGNOSIS — F0281 Dementia in other diseases classified elsewhere with behavioral disturbance: Secondary | ICD-10-CM | POA: Diagnosis not present

## 2019-05-04 DIAGNOSIS — R1311 Dysphagia, oral phase: Secondary | ICD-10-CM | POA: Diagnosis not present

## 2019-05-04 DIAGNOSIS — B964 Proteus (mirabilis) (morganii) as the cause of diseases classified elsewhere: Secondary | ICD-10-CM

## 2019-05-04 HISTORY — DX: Proteus (mirabilis) (morganii) as the cause of diseases classified elsewhere: N39.0

## 2019-05-04 HISTORY — DX: Urinary tract infection, site not specified: B96.4

## 2019-05-04 NOTE — Progress Notes (Signed)
Location:  [facility] Room Number: 214 B Place of Service:  SNF (31) Provider:  Kenard GowerMedina-Vargas, [place], DNP, FNP-BC  Patient Care Team: Pecola LawlessHopper, William F, MD as PCP - General (Internal Medicine)  Extended Emergency Contact Information Primary Emergency Contact: Robinson,Roberto Address: [address]          [place], [place] [address] [place] Home Phone: [phone] Relation: Son Secondary Emergency Contact: Reid,Cynthia  [place] Mobile Phone: [phone] Relation: Daughter  Code Status:  Full Code  Goals of care: Advanced Directive information Advanced Directives 04/30/2019  Does Patient Have a Medical Advance Directive? Yes  Type of Advance Directive (No Data)  Does patient want to make changes to medical advance directive? No - Patient declined  Copy of Healthcare Power of Attorney in Chart? -  Would patient like information on creating a medical advance directive? -  Pre-existing out of facility DNR order (yellow form or pink MOST form) -     Chief Complaint  Patient presents with  . Acute Visit    UTI    HPI:  Pt is a 84 y.o. female who was seen sitting on the floor last week. There was no complaints of pain nor tenderness. She was reported to occasionally refused her meals. Urinalysis was ordered due to reported increased confusion/agitation. Urine culture showed >100,000 CFU/ml non-lactose fermenting gram negative rods/ Proteus mirabilis.  Urinalysis showed nitrite 2+, urine leuk esterase 500, urine WBC 50-100. No hematuria nor fever was reported. She has PMH of dementia, hypothyroidism and hypertension.  Past Medical History:  Diagnosis Date  . Chronic kidney disease    Stage III  . Dementia with behavioral disturbance (HCC) 03/19/2015   Notated on FL2 Form from Dr. Dreama SaaJoel Brass [phone]  . Depression    previously saw Dr Donell BeersPlovsky  . Diabetes mellitus without complication (HCC)    11/18/15 A1c 6%  .  Dyslipidemia    11/18/15 Tg 74, HDL 60, LDL 90 on statin  . HLD (hyperlipidemia)   . Hypertension   . Hypothyroidism    11/18/15 TSH 53.6, 02/07/16 TSH 9.42 @ [facility]  . MDD (major depressive disorder)    With behavioral disturbance  . Scabies    02/21/16 [facility],[place],; Rx: Permethrin cream  . Urinary tract infection due to Proteus 05/04/2019   Uniformly sensitive except to nitrofurantoin; Cipro prescribed  . Vitamin D deficiency    11/18/15 vitamin D 25-hydroxy 39   Past Surgical History:  Procedure Laterality Date  . no record     04/10/15 [facility] note: see old chart    No Known Allergies  Outpatient Encounter Medications as of 05/04/2019  Medication Sig  . acetaminophen (TYLENOL) 325 MG tablet Take 650 mg by mouth every 6 (six) hours as needed for mild pain or moderate pain.  . ARIPiprazole (ABILIFY) 5 MG tablet Take 5 mg by mouth daily.  Marland Kitchen. aspirin EC 81 MG tablet Take 81 mg by mouth daily.   Marland Kitchen. atorvastatin (LIPITOR) 40 MG tablet Take 40 mg by mouth at bedtime.   . bisacodyl (DULCOLAX) 10 MG suppository If not relieved by MOM, give 10 mg Bisacodyl suppositiory rectally X 1 dose in 24 hours as needed (Do not use constipation standing orders for residents with renal failure/CFR less than 30. Contact MD for orders) (Physician Order)  . Calcium Carb-Cholecalciferol (CALCIUM-VITAMIN D3) 600-400 MG-UNIT TABS Take 1 tablet by mouth 2 (two) times daily.  . Cholecalciferol (VITAMIN D) 2000 units tablet  Take 2,000 Units by mouth daily.   . divalproex (DEPAKOTE) 250 MG DR tablet Take 250 mg by mouth 2 (two) times daily.  Marland Kitchen donepezil (ARICEPT) 10 MG tablet Take 10 mg by mouth at bedtime.   . furosemide (LASIX) 20 MG tablet Take 20 mg by mouth every Monday, Wednesday, and Friday.   . levothyroxine (SYNTHROID) 200 MCG tablet Take 200 mcg by mouth daily before breakfast.   . levothyroxine (SYNTHROID) 25 MCG tablet Take 25 mcg by mouth daily before breakfast.  . magnesium  hydroxide (MILK OF MAGNESIA) 400 MG/5ML suspension If no BM in 3 days, give 30 cc Milk of Magnesium p.o. x 1 dose in 24 hours as needed (Do not use standing constipation orders for residents with renal failure CFR less than 30. Contact MD for orders) (Physician Order)  . memantine (NAMENDA) 10 MG tablet Take 10 mg by mouth 2 (two) times daily.   . Multiple Vitamin (MULTI-VITAMIN DAILY PO) Take one tablet by mouth once daily   . PARoxetine (PAXIL) 10 MG tablet Take 15 mg by mouth at bedtime. Take 1-1/2 tablets to = 15 mg qd  . sennosides-docusate sodium (SENOKOT-S) 8.6-50 MG tablet Take 2 tablets by mouth daily.   . Skin Protectants, Misc. (MINERIN) CREA Apply topically to ankles twice a day for dry skin.  . Sodium Phosphates (RA SALINE ENEMA RE) If not relieved by Biscodyl suppository, give disposable Saline Enema rectally X 1 dose/24 hrs as needed (Do not use constipation standing orders for residents with renal failure/CFR less than 30. Contact MD for orders)(Physician Or   No facility-administered encounter medications on file as of 05/04/2019.     Review of Systems  GENERAL: No change in appetite, no fatigue, no weight changes, no fever, chills or weakness MOUTH and THROAT: Denies oral discomfort, gingival pain or bleeding, pain from teeth or hoarseness   RESPIRATORY: no cough, SOB, DOE, wheezing, hemoptysis CARDIAC: No chest pain or palpitations GI: No abdominal pain, diarrhea, constipation, heart burn, nausea or vomiting NEUROLOGICAL: Denies dizziness, syncope, numbness, or headache PSYCHIATRIC: Denies feelings of depression or anxiety. No report of hallucinations, insomnia, paranoia, or agitation    Immunization History  Administered Date(s) Administered  . Influenza-Unspecified 02/17/2016, 04/15/2017, 08/01/2018, 03/18/2019  . PPD Test 04/08/2016  . Pneumococcal Conjugate-13 01/11/2017  . Pneumococcal-Unspecified 02/16/2014  . Tdap 02/27/2019   Pertinent  Health Maintenance Due   Topic Date Due  . OPHTHALMOLOGY EXAM  05/08/2019 (Originally 10/12/1944)  . HEMOGLOBIN A1C  05/16/2019  . FOOT EXAM  06/10/2019  . INFLUENZA VACCINE  Completed  . PNA vac Low Risk Adult  Completed  . URINE MICROALBUMIN  Discontinued  . DEXA SCAN  Discontinued   Fall Risk  01/16/2019 01/14/2018 01/10/2017 08/07/2016 04/20/2016  Falls in the past year? 1 No No Yes Yes  Number falls in past yr: 1 - - 2 or more 2 or more  Injury with Fall? 0 - - - Yes  Risk Factor Category  - - - - High Fall Risk  Risk for fall due to : History of fall(s);Impaired balance/gait;Impaired mobility - - - -  Follow up Falls evaluation completed;Falls prevention discussed;Education provided - - - -     Vitals:   05/04/19 1036  BP: (!) 152/79  Pulse: 78  Resp: 20  Temp: (!) 97.5 F (36.4 C)  Weight: 204 lb 9.6 oz (92.8 kg)  Height: 5\' 9"  (1.753 m)   Body mass index is 30.21 kg/m.  Physical Exam  GENERAL  APPEARANCE: Well nourished. In no acute distress. Obese SKIN:  Skin is warm and dry. MOUTH and THROAT: Lips are without lesions. Oral mucosa is moist and without lesions. Tongue is normal in shape, size, and color and without lesions RESPIRATORY: Breathing is even & unlabored, BS CTAB CARDIAC: RRR, no murmur,no extra heart sounds GI: Abdomen soft, normal BS, no masses, no tenderness EXTREMITIES:  Able to move X 4 extremities NEUROLOGICAL: There is no tremor. Speech is clear. Alert to self, disoriented to time and place. PSYCHIATRIC:  Affect and behavior are appropriate  Labs reviewed: Recent Labs    08/22/18 09/01/18 11/01/18  NA 147 145 143  K 3.9 4.1 4.1  BUN 15 15 16   CREATININE 0.8 0.8 0.9    Recent Labs    06/20/18 08/22/18 11/01/18  WBC 3.9 3.6 4.8  NEUTROABS 1 1 1   HGB 12.5 12.2 12.9  HCT 37 36 40  PLT 126* 129* 162   Lab Results  Component Value Date   TSH 5.82 03/02/2019   Lab Results  Component Value Date   HGBA1C 6.2 11/13/2018   Lab Results  Component Value Date    CHOL 167 08/22/2018   HDL 49 08/22/2018   LDLCALC 106 08/22/2018   TRIG 58 08/22/2018     Assessment/Plan  1. Urinary tract infection without hematuria, site unspecified - no fever nor hematuria, urine culture showed >100,000 CFU/ml Proteus mirabilis, will start Cipro 500 mg BID X 7 days and Florastor 250 mg BID X 10 days,   2. Mood disorder with psychosis ([place]) -Continue divalproex sodium DR and aripiprazole, follow-up with psych NP  3. Alzheimer's disease of other onset with behavioral disturbance ([place]) -Continue donepezil and memantine, supportive care and fall precautions   Family/ staff Communication:  Discussed plan of care with resident and charge nurse.  Labs/tests ordered:  None  Goals of care:   Long-term care   Durenda Age, DNP, FNP-BC [facility] and Adult Medicine [phone] (Monday-Friday 8:00 a.m. - 5:00 p.m.) [phone] (after hours)

## 2019-05-05 ENCOUNTER — Non-Acute Institutional Stay (SKILLED_NURSING_FACILITY): Payer: Medicare Other | Admitting: Internal Medicine

## 2019-05-05 ENCOUNTER — Encounter: Payer: Self-pay | Admitting: Internal Medicine

## 2019-05-05 DIAGNOSIS — N390 Urinary tract infection, site not specified: Secondary | ICD-10-CM | POA: Insufficient documentation

## 2019-05-05 DIAGNOSIS — F0281 Dementia in other diseases classified elsewhere with behavioral disturbance: Secondary | ICD-10-CM

## 2019-05-05 DIAGNOSIS — R2681 Unsteadiness on feet: Secondary | ICD-10-CM | POA: Diagnosis not present

## 2019-05-05 DIAGNOSIS — R1311 Dysphagia, oral phase: Secondary | ICD-10-CM | POA: Diagnosis not present

## 2019-05-05 DIAGNOSIS — E034 Atrophy of thyroid (acquired): Secondary | ICD-10-CM | POA: Diagnosis not present

## 2019-05-05 DIAGNOSIS — G308 Other Alzheimer's disease: Secondary | ICD-10-CM

## 2019-05-05 DIAGNOSIS — M6281 Muscle weakness (generalized): Secondary | ICD-10-CM | POA: Diagnosis not present

## 2019-05-05 DIAGNOSIS — R7303 Prediabetes: Secondary | ICD-10-CM

## 2019-05-05 DIAGNOSIS — F02818 Dementia in other diseases classified elsewhere, unspecified severity, with other behavioral disturbance: Secondary | ICD-10-CM

## 2019-05-05 DIAGNOSIS — F0391 Unspecified dementia with behavioral disturbance: Secondary | ICD-10-CM | POA: Diagnosis not present

## 2019-05-05 DIAGNOSIS — R296 Repeated falls: Secondary | ICD-10-CM | POA: Diagnosis not present

## 2019-05-05 HISTORY — DX: Urinary tract infection, site not specified: N39.0

## 2019-05-05 NOTE — Assessment & Plan Note (Signed)
Despite pandemic quarantine; TSH needs to be checked due to the high dose of L-thyroxine she is receiving.

## 2019-05-05 NOTE — Assessment & Plan Note (Signed)
Exacerbation of confusion associated with fall appears to be precipitated by Proteus UTI for which antibiotics were initiated.

## 2019-05-05 NOTE — Assessment & Plan Note (Addendum)
A1c was 6.2% on 11/13/2018.  She is on Abilify; A1c update indicated as this agent can be associated with hyperglycemia. This agent can be potentiated by CYP2D6 inhibitors such as paroxetine.  Neutropenia has been associated with this agent as well.

## 2019-05-05 NOTE — Patient Instructions (Signed)
See assessment and plan under each diagnosis in the problem list and acutely for this visit 

## 2019-05-05 NOTE — Progress Notes (Signed)
   NURSING HOME LOCATION:  [facility] ROOM NUMBER:  214-B  CODE STATUS:  Full Code  PCP:  Hendricks Limes, MD  [place] [address]  This is a nursing facility follow up of chronic medical diagnoses.  Interim medical record and care since last [place] visit was updated with review of diagnostic studies and change in clinical status since last visit were documented.  HPI: She is a permanent resident of the facility with medical diagnoses of essential hypertension, hypothyroidism, major depressive disorder, vitamin D deficiency, dyslipidemia, dementia with behavioral disturbance, CKD stage III, and diabetes with renal complication.  Review of systems: She could not provide any history.  She kept repeating "medicine tastes bad, medicine tastes bad".  Apparently, according to the nurse, she was referring to the Cipro which was started for the UTI.  She does not answer questions.  She did not respond when asked if she was having fever or GU symptoms. She was seen yesterday by the [facility] NP because of agitation and falls.  Urinalysis revealed 500  leukocyte esterase, 2+ urine nitrite, 51-100 white cells, and 5-10 red cells.  Culture revealed greater than 100,000 of gram-negative rods, Proteus mirabilis.  It was uniformly sensitive except to nitrofurantoin.Staff reports that she has been more combative and even cursing in the context of the recently documented UTI.  She has been resistant to getting out of bed. Psych NP has followed the patient; the patient is on paroxetine,Donepezil, Namenda, Depakote, and Abilify. She remains on high-dose L-thyroxine at 225 mcg daily.  TSH was still elevated at 13.72 on 12/31/2018. A1c was prediabetic at 6.2% on 10/24/2018.  Physical exam:  Pertinent or positive findings: As noted she perseverates concerning the bitter medication taste.  She grimaces and stares blankly.  There was no pill residue intraorally.  She has a slow rhythm with  a grade 1/2 systolic murmur.Decreased breath sounds. Pedal pulses are decreased.  She is weak to opposition in all extremities.  General appearance: Adequately nourished; no acute distress, increased work of breathing is present.   Lymphatic: No lymphadenopathy about the head, neck, axilla. Eyes: No conjunctival inflammation or lid edema is present. There is no scleral icterus. Ears:  External ear exam shows no significant lesions or deformities.   Nose:  External nasal examination shows no deformity or inflammation. Nasal mucosa are pink and moist without lesions, exudates Oral exam:  Lips and gums are healthy appearing. There is no oropharyngeal erythema or exudate. Neck:  No thyromegaly, masses, tenderness noted.  Heart:  No gallop,  click, rub .  Lungs:  without wheezes, rhonchi, rales, rubs. Abdomen: Bowel sounds are normal. Abdomen is soft and nontender with no organomegaly, hernias, masses. GU: Deferred  Extremities:  No cyanosis, clubbing, edema  Neurologic exam : Balance, Rhomberg, finger to nose testing could not be completed due to clinical state Skin: Warm & dry w/o tenting. No significant lesions or rash.  See summary under each active problem in the Problem List with associated updated therapeutic plan

## 2019-05-06 DIAGNOSIS — F0391 Unspecified dementia with behavioral disturbance: Secondary | ICD-10-CM | POA: Diagnosis not present

## 2019-05-06 DIAGNOSIS — R296 Repeated falls: Secondary | ICD-10-CM | POA: Diagnosis not present

## 2019-05-06 DIAGNOSIS — E1122 Type 2 diabetes mellitus with diabetic chronic kidney disease: Secondary | ICD-10-CM | POA: Diagnosis not present

## 2019-05-06 DIAGNOSIS — F329 Major depressive disorder, single episode, unspecified: Secondary | ICD-10-CM | POA: Diagnosis not present

## 2019-05-06 DIAGNOSIS — R1311 Dysphagia, oral phase: Secondary | ICD-10-CM | POA: Diagnosis not present

## 2019-05-06 DIAGNOSIS — D519 Vitamin B12 deficiency anemia, unspecified: Secondary | ICD-10-CM | POA: Diagnosis not present

## 2019-05-06 DIAGNOSIS — R2681 Unsteadiness on feet: Secondary | ICD-10-CM | POA: Diagnosis not present

## 2019-05-06 DIAGNOSIS — M6281 Muscle weakness (generalized): Secondary | ICD-10-CM | POA: Diagnosis not present

## 2019-05-06 DIAGNOSIS — E039 Hypothyroidism, unspecified: Secondary | ICD-10-CM | POA: Diagnosis not present

## 2019-05-06 DIAGNOSIS — D649 Anemia, unspecified: Secondary | ICD-10-CM | POA: Diagnosis not present

## 2019-05-06 LAB — VITAMIN B12: Vitamin B-12: 398

## 2019-05-06 LAB — IRON,TIBC AND FERRITIN PANEL
%SAT: 28.09
Ferritin: 68.74
Iron: 63
TIBC: 223
UIBC: 160

## 2019-05-06 LAB — CBC AND DIFFERENTIAL
HCT: 37 (ref 36–46)
Hemoglobin: 12.5 (ref 12.0–16.0)
Platelets: 126 — AB (ref 150–399)
WBC: 3.4

## 2019-05-06 LAB — HEMOGLOBIN A1C: Hemoglobin A1C: 6.1

## 2019-05-06 LAB — TSH: TSH: 9.9 — AB (ref 0.41–5.90)

## 2019-05-07 DIAGNOSIS — R1311 Dysphagia, oral phase: Secondary | ICD-10-CM | POA: Diagnosis not present

## 2019-05-07 DIAGNOSIS — M6281 Muscle weakness (generalized): Secondary | ICD-10-CM | POA: Diagnosis not present

## 2019-05-07 DIAGNOSIS — R2681 Unsteadiness on feet: Secondary | ICD-10-CM | POA: Diagnosis not present

## 2019-05-07 DIAGNOSIS — R296 Repeated falls: Secondary | ICD-10-CM | POA: Diagnosis not present

## 2019-05-07 DIAGNOSIS — F0391 Unspecified dementia with behavioral disturbance: Secondary | ICD-10-CM | POA: Diagnosis not present

## 2019-05-08 DIAGNOSIS — G308 Other Alzheimer's disease: Secondary | ICD-10-CM | POA: Diagnosis not present

## 2019-05-08 DIAGNOSIS — F39 Unspecified mood [affective] disorder: Secondary | ICD-10-CM | POA: Diagnosis not present

## 2019-05-08 DIAGNOSIS — R1311 Dysphagia, oral phase: Secondary | ICD-10-CM | POA: Diagnosis not present

## 2019-05-08 DIAGNOSIS — M6281 Muscle weakness (generalized): Secondary | ICD-10-CM | POA: Diagnosis not present

## 2019-05-08 DIAGNOSIS — F339 Major depressive disorder, recurrent, unspecified: Secondary | ICD-10-CM | POA: Diagnosis not present

## 2019-05-08 DIAGNOSIS — R296 Repeated falls: Secondary | ICD-10-CM | POA: Diagnosis not present

## 2019-05-08 DIAGNOSIS — F0281 Dementia in other diseases classified elsewhere with behavioral disturbance: Secondary | ICD-10-CM | POA: Diagnosis not present

## 2019-05-08 DIAGNOSIS — R2681 Unsteadiness on feet: Secondary | ICD-10-CM | POA: Diagnosis not present

## 2019-05-08 DIAGNOSIS — F0391 Unspecified dementia with behavioral disturbance: Secondary | ICD-10-CM | POA: Diagnosis not present

## 2019-05-11 DIAGNOSIS — M6281 Muscle weakness (generalized): Secondary | ICD-10-CM | POA: Diagnosis not present

## 2019-05-11 DIAGNOSIS — R296 Repeated falls: Secondary | ICD-10-CM | POA: Diagnosis not present

## 2019-05-11 DIAGNOSIS — F0391 Unspecified dementia with behavioral disturbance: Secondary | ICD-10-CM | POA: Diagnosis not present

## 2019-05-11 DIAGNOSIS — R2681 Unsteadiness on feet: Secondary | ICD-10-CM | POA: Diagnosis not present

## 2019-05-11 DIAGNOSIS — R1311 Dysphagia, oral phase: Secondary | ICD-10-CM | POA: Diagnosis not present

## 2019-05-12 DIAGNOSIS — R1311 Dysphagia, oral phase: Secondary | ICD-10-CM | POA: Diagnosis not present

## 2019-05-12 DIAGNOSIS — R2681 Unsteadiness on feet: Secondary | ICD-10-CM | POA: Diagnosis not present

## 2019-05-12 DIAGNOSIS — M6281 Muscle weakness (generalized): Secondary | ICD-10-CM | POA: Diagnosis not present

## 2019-05-12 DIAGNOSIS — F0391 Unspecified dementia with behavioral disturbance: Secondary | ICD-10-CM | POA: Diagnosis not present

## 2019-05-12 DIAGNOSIS — R296 Repeated falls: Secondary | ICD-10-CM | POA: Diagnosis not present

## 2019-05-13 DIAGNOSIS — R2681 Unsteadiness on feet: Secondary | ICD-10-CM | POA: Diagnosis not present

## 2019-05-13 DIAGNOSIS — R296 Repeated falls: Secondary | ICD-10-CM | POA: Diagnosis not present

## 2019-05-13 DIAGNOSIS — F0391 Unspecified dementia with behavioral disturbance: Secondary | ICD-10-CM | POA: Diagnosis not present

## 2019-05-13 DIAGNOSIS — R1311 Dysphagia, oral phase: Secondary | ICD-10-CM | POA: Diagnosis not present

## 2019-05-13 DIAGNOSIS — M6281 Muscle weakness (generalized): Secondary | ICD-10-CM | POA: Diagnosis not present

## 2019-05-14 DIAGNOSIS — R296 Repeated falls: Secondary | ICD-10-CM | POA: Diagnosis not present

## 2019-05-14 DIAGNOSIS — R2681 Unsteadiness on feet: Secondary | ICD-10-CM | POA: Diagnosis not present

## 2019-05-14 DIAGNOSIS — M6281 Muscle weakness (generalized): Secondary | ICD-10-CM | POA: Diagnosis not present

## 2019-05-14 DIAGNOSIS — F0391 Unspecified dementia with behavioral disturbance: Secondary | ICD-10-CM | POA: Diagnosis not present

## 2019-05-14 DIAGNOSIS — R1311 Dysphagia, oral phase: Secondary | ICD-10-CM | POA: Diagnosis not present

## 2019-05-15 DIAGNOSIS — F0391 Unspecified dementia with behavioral disturbance: Secondary | ICD-10-CM | POA: Diagnosis not present

## 2019-05-15 DIAGNOSIS — R2681 Unsteadiness on feet: Secondary | ICD-10-CM | POA: Diagnosis not present

## 2019-05-15 DIAGNOSIS — M6281 Muscle weakness (generalized): Secondary | ICD-10-CM | POA: Diagnosis not present

## 2019-05-15 DIAGNOSIS — R1311 Dysphagia, oral phase: Secondary | ICD-10-CM | POA: Diagnosis not present

## 2019-05-15 DIAGNOSIS — R296 Repeated falls: Secondary | ICD-10-CM | POA: Diagnosis not present

## 2019-05-16 DIAGNOSIS — R2681 Unsteadiness on feet: Secondary | ICD-10-CM | POA: Diagnosis not present

## 2019-05-16 DIAGNOSIS — R296 Repeated falls: Secondary | ICD-10-CM | POA: Diagnosis not present

## 2019-05-16 DIAGNOSIS — R1311 Dysphagia, oral phase: Secondary | ICD-10-CM | POA: Diagnosis not present

## 2019-05-16 DIAGNOSIS — M6281 Muscle weakness (generalized): Secondary | ICD-10-CM | POA: Diagnosis not present

## 2019-05-16 DIAGNOSIS — F0391 Unspecified dementia with behavioral disturbance: Secondary | ICD-10-CM | POA: Diagnosis not present

## 2019-05-29 ENCOUNTER — Non-Acute Institutional Stay (SKILLED_NURSING_FACILITY): Payer: Medicare Other | Admitting: Adult Health

## 2019-05-29 ENCOUNTER — Encounter: Payer: Self-pay | Admitting: Adult Health

## 2019-05-29 DIAGNOSIS — E119 Type 2 diabetes mellitus without complications: Secondary | ICD-10-CM | POA: Diagnosis not present

## 2019-05-29 DIAGNOSIS — F39 Unspecified mood [affective] disorder: Secondary | ICD-10-CM

## 2019-05-29 DIAGNOSIS — G308 Other Alzheimer's disease: Secondary | ICD-10-CM

## 2019-05-29 DIAGNOSIS — F0281 Dementia in other diseases classified elsewhere with behavioral disturbance: Secondary | ICD-10-CM

## 2019-05-29 DIAGNOSIS — F02818 Dementia in other diseases classified elsewhere, unspecified severity, with other behavioral disturbance: Secondary | ICD-10-CM

## 2019-05-29 DIAGNOSIS — E034 Atrophy of thyroid (acquired): Secondary | ICD-10-CM | POA: Diagnosis not present

## 2019-05-29 DIAGNOSIS — E782 Mixed hyperlipidemia: Secondary | ICD-10-CM | POA: Diagnosis not present

## 2019-05-29 DIAGNOSIS — F339 Major depressive disorder, recurrent, unspecified: Secondary | ICD-10-CM

## 2019-05-29 NOTE — Progress Notes (Signed)
Location:  [facility] Room Number: 214/B Place of Service:  SNF (31) Provider:  Durenda Age, DNP, FNP-BC  Patient Care Team: Hendricks Limes, MD as PCP - General (Internal Medicine)  Extended Emergency Contact Information Primary Emergency Contact: Robinson,Roberto Address: [address]          [place], [place] [address] [name] of [place] Phone: [phone] Relation: Son Secondary Emergency Contact: Reid,Cynthia  [place] Mobile Phone: [phone] Relation: Daughter  Code Status:  Full Code  Goals of care: Advanced Directive information Advanced Directives 05/29/2019  Does Patient Have a Medical Advance Directive? Yes  Type of Advance Directive (No Data)  Does patient want to make changes to medical advance directive? No - Patient declined  Copy of [place] in Chart? -  Would patient like information on creating a medical advance directive? -  Pre-existing out of facility DNR order (yellow form or pink MOST form) -     Chief Complaint  Patient presents with  . Medical Management of Chronic Issues    Routine visit of medical management    HPI:  Pt is a 84 y.o. female seen today for medical management of chronic diseases.  He has PMH of dementia, hypothyroidism and hypertension. She was noted to be agitated and tried to grab me. Charge nurse reported that she has been agitated because family was supposed to do a window visit but cancelled. She was recently treated for Proteus mirabilis UTI. She takes Divalproex for mood disorder. Latest tsh 9.9 (05/06/19). Patient sometimes refuses her medications when agitated. She is on Levothyroxine 225 mcg daily for hypothyroidism. Latest A1c 6.1, not on any medication for diabetes.   Past Medical History:  Diagnosis Date  . Chronic kidney disease    Stage III  . Dementia with behavioral disturbance ([place]) 03/19/2015   Notated on FL2 Form from Dr.  Agapito Games [phone]  . Depression    previously saw Dr Casimiro Needle  . Diabetes mellitus without complication ([place])    08/23/16 A1c 6%  . Dyslipidemia    11/18/15 Tg 74, HDL 60, LDL 90 on statin  . HLD (hyperlipidemia)   . Hypertension   . Hypothyroidism    11/18/15 TSH 53.6, 02/07/16 TSH 9.42 @ [facility]  . MDD (major depressive disorder)    With behavioral disturbance  . Scabies    02/21/16 [facility],[place]; Rx: Permethrin cream  . Urinary tract infection due to Proteus 05/04/2019   Uniformly sensitive except to nitrofurantoin; Cipro prescribed  . Vitamin D deficiency    11/18/15 vitamin D 25-hydroxy 39   Past Surgical History:  Procedure Laterality Date  . no record     04/10/15 [facility] note: see old chart    No Known Allergies  Outpatient Encounter Medications as of 05/29/2019  Medication Sig  . acetaminophen (TYLENOL) 325 MG tablet Take 650 mg by mouth every 6 (six) hours as needed for mild pain or moderate pain.  . ARIPiprazole (ABILIFY) 5 MG tablet Take 5 mg by mouth daily.  Marland Kitchen aspirin EC 81 MG tablet Take 81 mg by mouth daily.   Marland Kitchen atorvastatin (LIPITOR) 40 MG tablet Take 40 mg by mouth at bedtime.   . bisacodyl (DULCOLAX) 10 MG suppository If not relieved by MOM, give 10 mg Bisacodyl suppositiory rectally X 1 dose in 24 hours as needed (Do not use constipation standing orders for residents with renal failure/CFR less than 30. Contact MD for orders) (Physician Order)  .  Calcium Carb-Cholecalciferol (CALCIUM-VITAMIN D3) 600-400 MG-UNIT TABS Take 1 tablet by mouth 2 (two) times daily.  . Cholecalciferol (VITAMIN D) 2000 units tablet Take 2,000 Units by mouth daily.   . divalproex (DEPAKOTE) 250 MG DR tablet Take 250 mg by mouth 2 (two) times daily.  Marland Kitchen donepezil (ARICEPT) 10 MG tablet Take 10 mg by mouth at bedtime.   . furosemide (LASIX) 20 MG tablet Take 20 mg by mouth every Monday, Wednesday, and Friday.   . levothyroxine (SYNTHROID) 200 MCG tablet Take 200 mcg by  mouth daily before breakfast.   . levothyroxine (SYNTHROID) 25 MCG tablet Take 25 mcg by mouth daily before breakfast.  . magnesium hydroxide (MILK OF MAGNESIA) 400 MG/5ML suspension If no BM in 3 days, give 30 cc Milk of Magnesium p.o. x 1 dose in 24 hours as needed (Do not use standing constipation orders for residents with renal failure CFR less than 30. Contact MD for orders) (Physician Order)  . memantine (NAMENDA) 10 MG tablet Take 10 mg by mouth 2 (two) times daily.   . Multiple Vitamin (MULTI-VITAMIN DAILY PO) Take one tablet by mouth once daily   . PARoxetine (PAXIL) 10 MG tablet Take 15 mg by mouth at bedtime. Take 1-1/2 tablets to = 15 mg qd  . sennosides-docusate sodium (SENOKOT-S) 8.6-50 MG tablet Take 2 tablets by mouth daily.   . Skin Protectants, Misc. (MINERIN) CREA Apply topically to ankles twice a day for dry skin.  . Sodium Phosphates (RA SALINE ENEMA RE) If not relieved by Biscodyl suppository, give disposable Saline Enema rectally X 1 dose/24 hrs as needed (Do not use constipation standing orders for residents with renal failure/CFR less than 30. Contact MD for orders)(Physician Or   No facility-administered encounter medications on file as of 05/29/2019.    Review of Systems  GENERAL: No change in appetite, no fatigue, no weight changes, no fever, chills or weakness MOUTH and THROAT: Denies oral discomfort, gingival pain or bleeding RESPIRATORY: no cough, SOB, DOE, wheezing, hemoptysis CARDIAC: No chest pain or palpitations GI: No abdominal pain, diarrhea, constipation, heart burn, nausea or vomiting NEUROLOGICAL: Denies dizziness, syncope, numbness, or headache PSYCHIATRIC: +agitation   Immunization History  Administered Date(s) Administered  . Influenza-Unspecified 02/17/2016, 04/15/2017, 08/01/2018, 03/18/2019  . PPD Test 04/08/2016  . Pneumococcal Conjugate-13 01/11/2017  . Pneumococcal-Unspecified 02/16/2014  . Tdap 02/27/2019   Pertinent  Health  Maintenance Due  Topic Date Due  . OPHTHALMOLOGY EXAM  10/12/1944  . HEMOGLOBIN A1C  05/16/2019  . FOOT EXAM  06/10/2019  . INFLUENZA VACCINE  Completed  . PNA vac Low Risk Adult  Completed  . URINE MICROALBUMIN  Discontinued  . DEXA SCAN  Discontinued   Fall Risk  01/16/2019 01/14/2018 01/10/2017 08/07/2016 04/20/2016  Falls in the past year? 1 No No Yes Yes  Number falls in past yr: 1 - - 2 or more 2 or more  Injury with Fall? 0 - - - Yes  Risk Factor Category  - - - - High Fall Risk  Risk for fall due to : History of fall(s);Impaired balance/gait;Impaired mobility - - - -  Follow up Falls evaluation completed;Falls prevention discussed;Education provided - - - -     Vitals:   05/29/19 1021  BP: 138/79  Pulse: 76  Resp: 18  Temp: 98 F (36.7 C)  TempSrc: Oral  SpO2: 95%  Weight: 205 lb 3.2 oz (93.1 kg)  Height: 5\' 9"  (1.753 m)   Body mass index is 30.3 kg/m.  Physical Exam  GENERAL APPEARANCE: Well nourished. In no acute distress. Obese SKIN:  Skin is warm and dry.  MOUTH and THROAT: Lips are without lesions. Oral mucosa is moist and without lesions. Tongue is normal in shape, size, and color and without lesions RESPIRATORY: Breathing is even & unlabored, BS CTAB CARDIAC: RRR, no murmur,no extra heart sounds, BLE 1+ edema GI: Abdomen soft, normal BS, no masses, no tenderness EXTREMITIES:  Able to move X 4 extremities NEUROLOGICAL: There is no tremor. Speech is clear. Alert to self, disoriented to time and place.  PSYCHIATRIC:  Agitated  Labs reviewed:  05/06/19   TSH 9.9   hemoglobin A1c 6.1 Recent Labs    08/22/18 0000 09/01/18 0000 11/01/18 0000  NA 147 145 143  K 3.9 4.1 4.1  BUN 15 15 16   CREATININE 0.8 0.8 0.9    Recent Labs    06/20/18 0000 08/22/18 0000 11/01/18 0000  WBC 3.9 3.6 4.8  NEUTROABS 1 1 1   HGB 12.5 12.2 12.9  HCT 37 36 40  PLT 126* 129* 162   Lab Results  Component Value Date   TSH 5.82 03/02/2019   Lab Results  Component  Value Date   HGBA1C 6.2 11/13/2018   Lab Results  Component Value Date   CHOL 167 08/22/2018   HDL 49 08/22/2018   LDLCALC 106 08/22/2018   TRIG 58 08/22/2018     Assessment/Plan  1. Mixed hyperlipidemia Lab Results  Component Value Date   CHOL 167 08/22/2018   HDL 49 08/22/2018   LDLCALC 106 08/22/2018   TRIG 58 08/22/2018  -Continue Lipitor 40 mg 1 tab at bedtime  2. Diabetes mellitus without complication (HCC) - -Hemoglobin A1c 6.1, 05/06/2019 -Diet controlled  3. Hypothyroidism due to acquired atrophy of thyroid - will continue levothyroxine 200 mcg  + 25 mcg = 225 mcg daily  4. Mood disorder with psychosis (HCC) -Agitated Continue divalproex 250 mg 1 tab twice a day and aripiprazole 5 mg 1 tab daily, follow-up with psych NP  5. Recurrent major depressive disorder, remission status unspecified (HCC) -Continue paroxetine 10 mg 1 tab daily  6. Alzheimer's disease of other onset with behavioral disturbance (HCC) -Continue memantine 10 mg 1 tab twice a day and donepezil 10 mg 1 tab at bedtime      Family/ staff Communication: Discussed plan of care with resident and charge nurse.  Labs/tests ordered: None  Goals of care:   Long-term care   Kenard GowerMonina Medina-Vargas, DNP, FNP-BC [facility] [phone] (Monday-Friday 8:00 a.m. - 5:00 p.m.) [phone] (after hours)

## 2019-05-31 DIAGNOSIS — E119 Type 2 diabetes mellitus without complications: Secondary | ICD-10-CM | POA: Insufficient documentation

## 2019-05-31 DIAGNOSIS — I129 Hypertensive chronic kidney disease with stage 1 through stage 4 chronic kidney disease, or unspecified chronic kidney disease: Secondary | ICD-10-CM | POA: Insufficient documentation

## 2019-06-19 DIAGNOSIS — F0280 Dementia in other diseases classified elsewhere without behavioral disturbance: Secondary | ICD-10-CM | POA: Diagnosis not present

## 2019-06-19 DIAGNOSIS — R41841 Cognitive communication deficit: Secondary | ICD-10-CM | POA: Diagnosis not present

## 2019-06-19 DIAGNOSIS — I129 Hypertensive chronic kidney disease with stage 1 through stage 4 chronic kidney disease, or unspecified chronic kidney disease: Secondary | ICD-10-CM | POA: Diagnosis not present

## 2019-06-24 ENCOUNTER — Encounter: Payer: Self-pay | Admitting: Adult Health

## 2019-06-24 ENCOUNTER — Non-Acute Institutional Stay (SKILLED_NURSING_FACILITY): Payer: Medicare Other | Admitting: Adult Health

## 2019-06-24 DIAGNOSIS — E119 Type 2 diabetes mellitus without complications: Secondary | ICD-10-CM | POA: Diagnosis not present

## 2019-06-24 DIAGNOSIS — E034 Atrophy of thyroid (acquired): Secondary | ICD-10-CM | POA: Diagnosis not present

## 2019-06-24 DIAGNOSIS — F39 Unspecified mood [affective] disorder: Secondary | ICD-10-CM

## 2019-06-24 DIAGNOSIS — G308 Other Alzheimer's disease: Secondary | ICD-10-CM

## 2019-06-24 DIAGNOSIS — F339 Major depressive disorder, recurrent, unspecified: Secondary | ICD-10-CM | POA: Diagnosis not present

## 2019-06-24 DIAGNOSIS — F0281 Dementia in other diseases classified elsewhere with behavioral disturbance: Secondary | ICD-10-CM

## 2019-06-24 NOTE — Progress Notes (Signed)
Location:  [facility] Room Number: 214/B Place of Service:  SNF (31) Provider:  Durenda Age, DNP, FNP-BC  Patient Care Team: Hendricks Limes, MD as PCP - General (Internal Medicine)  Extended Emergency Contact Information Primary Emergency Contact: Robinson,Roberto Address: [address]          [place], [place] [address] [name] of [place] Phone: [phone] Relation: Son Secondary Emergency Contact: Reid,Cynthia  [place] Mobile Phone: [phone] Relation: Daughter  Code Status:  Full Code  Goals of care: Advanced Directive information Advanced Directives 06/24/2019  Does Patient Have a Medical Advance Directive? Yes  Type of Advance Directive (No Data)  Does patient want to make changes to medical advance directive? No - Patient declined  Copy of [place] in Chart? -  Would patient like information on creating a medical advance directive? -  Pre-existing out of facility DNR order (yellow form or pink MOST form) -     Chief Complaint  Patient presents with  . Medical Management of Chronic Issues    Routine visit of medical management    HPI:  Pt is a 85 y.o. female seen today for medical management of chronic diseases.  She has PMH of dementia, hypothyroidism and hypertension. This morning, she was seen sitting on the floor beside her bed with the bed cover wrapped around her legs and underneath her. Full ROM exercises were done on all 4 extremities. She wasn't able to tell what had happened. She became combative to staff during care and refused vital signs. Staff will take it again later.  She takes aripiprazole and paroxetine for depression and Depakote for mood disorder with psychosis and followed up by psych NP.  Latest TSH 9.90 (05/06/2019).  She takes levothyroxine for hypothyroidism.  Lower extremity edema, trace. Currently taking Lasix for lower extremity edema.   Past Medical  History:  Diagnosis Date  . Chronic kidney disease    Stage III  . Dementia with behavioral disturbance ([place]) 03/19/2015   Notated on FL2 Form from Dr. Agapito Games [phone]  . Depression    previously saw Dr Casimiro Needle  . Diabetes mellitus without complication ([place])    09/19/30 A1c 6%  . Dyslipidemia    11/18/15 Tg 74, HDL 60, LDL 90 on statin  . HLD (hyperlipidemia)   . Hypertension   . Hypothyroidism    11/18/15 TSH 53.6, 02/07/16 TSH 9.42 @ [facility]  . MDD (major depressive disorder)    With behavioral disturbance  . Scabies    02/21/16 [facility],[place]; Rx: Permethrin cream  . Urinary tract infection due to Proteus 05/04/2019   Uniformly sensitive except to nitrofurantoin; Cipro prescribed  . Vitamin D deficiency    11/18/15 vitamin D 25-hydroxy 39   Past Surgical History:  Procedure Laterality Date  . no record     04/10/15 [facility] note: see old chart    No Known Allergies  Outpatient Encounter Medications as of 06/24/2019  Medication Sig  . acetaminophen (TYLENOL) 325 MG tablet Take 650 mg by mouth every 6 (six) hours as needed for mild pain or moderate pain.  . ARIPiprazole (ABILIFY) 5 MG tablet Take 5 mg by mouth daily.  Marland Kitchen aspirin EC 81 MG tablet Take 81 mg by mouth daily.   Marland Kitchen atorvastatin (LIPITOR) 40 MG tablet Take 40 mg by mouth at bedtime.   . bisacodyl (DULCOLAX) 10 MG suppository If not relieved by MOM, give 10 mg Bisacodyl suppositiory rectally  X 1 dose in 24 hours as needed (Do not use constipation standing orders for residents with renal failure/CFR less than 30. Contact MD for orders) (Physician Order)  . Calcium Carb-Cholecalciferol (CALCIUM-VITAMIN D3) 600-400 MG-UNIT TABS Take 1 tablet by mouth 2 (two) times daily.  . Cholecalciferol (VITAMIN D) 2000 units tablet Take 2,000 Units by mouth daily.   . divalproex (DEPAKOTE) 250 MG DR tablet Take 250 mg by mouth 2 (two) times daily.  Marland Kitchen donepezil (ARICEPT) 10 MG tablet Take 10 mg by mouth at bedtime.     . furosemide (LASIX) 20 MG tablet Take 20 mg by mouth every Monday, Wednesday, and Friday.   . levothyroxine (SYNTHROID) 200 MCG tablet Take 200 mcg by mouth daily before breakfast.   . levothyroxine (SYNTHROID) 25 MCG tablet Take 25 mcg by mouth daily before breakfast.  . magnesium hydroxide (MILK OF MAGNESIA) 400 MG/5ML suspension If no BM in 3 days, give 30 cc Milk of Magnesium p.o. x 1 dose in 24 hours as needed (Do not use standing constipation orders for residents with renal failure CFR less than 30. Contact MD for orders) (Physician Order)  . memantine (NAMENDA) 10 MG tablet Take 10 mg by mouth 2 (two) times daily.   . Multiple Vitamin (MULTI-VITAMIN DAILY PO) Take one tablet by mouth once daily   . PARoxetine (PAXIL) 10 MG tablet Take 15 mg by mouth at bedtime. Take 1-1/2 tablets to = 15 mg qd  . sennosides-docusate sodium (SENOKOT-S) 8.6-50 MG tablet Take 2 tablets by mouth daily.   . Skin Protectants, Misc. (MINERIN) CREA Apply topically to ankles twice a day for dry skin.  . Sodium Phosphates (RA SALINE ENEMA RE) If not relieved by Biscodyl suppository, give disposable Saline Enema rectally X 1 dose/24 hrs as needed (Do not use constipation standing orders for residents with renal failure/CFR less than 30. Contact MD for orders)(Physician Or   No facility-administered encounter medications on file as of 06/24/2019.    Review of Systems  GENERAL: No change in appetite, no fatigue, no weight changes, no fever, chills or weakness SKIN: Denies rash, itching, wounds, ulcer sores, or nail abnormalities MOUTH and THROAT: Denies oral discomfort, gingival pain or bleeding, pain from teeth or hoarseness   RESPIRATORY: no cough, SOB, DOE, wheezing, hemoptysis CARDIAC: No chest pain or palpitations GI: No abdominal pain, diarrhea, constipation, heart burn, nausea or vomiting GU: Denies dysuria, frequency, hematuria or discharge NEUROLOGICAL: Denies dizziness, syncope, numbness, or  headache PSYCHIATRIC: + agitation   Immunization History  Administered Date(s) Administered  . Influenza-Unspecified 02/17/2016, 04/15/2017, 08/01/2018, 03/18/2019  . PPD Test 04/08/2016  . Pneumococcal Conjugate-13 01/11/2017  . Pneumococcal-Unspecified 02/16/2014  . Tdap 02/27/2019   Pertinent  Health Maintenance Due  Topic Date Due  . OPHTHALMOLOGY EXAM  10/12/1944  . FOOT EXAM  06/10/2019  . HEMOGLOBIN A1C  11/03/2019  . INFLUENZA VACCINE  Completed  . PNA vac Low Risk Adult  Completed  . URINE MICROALBUMIN  Discontinued  . DEXA SCAN  Discontinued   Fall Risk  01/16/2019 01/14/2018 01/10/2017 08/07/2016 04/20/2016  Falls in the past year? 1 No No Yes Yes  Number falls in past yr: 1 - - 2 or more 2 or more  Injury with Fall? 0 - - - Yes  Risk Factor Category  - - - - High Fall Risk  Risk for fall due to : History of fall(s);Impaired balance/gait;Impaired mobility - - - -  Follow up Falls evaluation completed;Falls prevention discussed;Education provided - - - -  Vitals:   06/24/19 0841  BP: 134/80  Pulse: 80  Resp: 18  Temp: 97.8 F (36.6 C)  TempSrc: Oral  SpO2: 95%  Weight: 205 lb 3.2 oz (93.1 kg)  Height: 5\' 9"  (1.753 m)   Body mass index is 30.3 kg/m.  Physical Exam  GENERAL APPEARANCE: Well nourished. In no acute distress. Obese SKIN:  Skin is warm and dry.  MOUTH and THROAT: Lips are without lesions. Oral mucosa is moist and without lesions. Tongue is normal in shape, size, and color and without lesions RESPIRATORY: Breathing is even & unlabored, BS CTAB CARDIAC: RRR, no murmur,no extra heart sounds, BLE trace edema GI: Abdomen soft, normal BS, no masses, no tenderness EXTREMITIES:  Able to move X 4 extremities NEUROLOGICAL: There is no tremor. Speech is clear. Alert to self, disoriented to time and place. PSYCHIATRIC:  Affect and behavior are appropriate  Labs reviewed: Recent Labs    08/22/18 0000 09/01/18 0000 11/01/18 0000  NA 147 145 143   K 3.9 4.1 4.1  BUN 15 15 16   CREATININE 0.8 0.8 0.9    Recent Labs    08/22/18 0000 11/01/18 0000 05/06/19 0000  WBC 3.6 4.8 3.4  NEUTROABS 1 1  --   HGB 12.2 12.9 12.5  HCT 36 40 37  PLT 129* 162 126*   Lab Results  Component Value Date   TSH 9.90 (A) 05/06/2019   Lab Results  Component Value Date   HGBA1C 6.1 05/06/2019   Lab Results  Component Value Date   CHOL 167 08/22/2018   HDL 49 08/22/2018   LDLCALC 106 08/22/2018   TRIG 58 08/22/2018     Assessment/Plan  1. Diabetes mellitus without complication [facility]) Lab Results  Component Value Date   HGBA1C 6.1 05/06/2019  -Diet controlled  2. Hypothyroidism due to acquired atrophy of thyroid Lab Results  Component Value Date   TSH 9.90 (A) 05/06/2019  -Continue levothyroxine  3. Mood disorder with psychosis (HCC) -Has episodes of agitation, combative duty ADL care, continue divalproex and aripiprazole, followed up by psych NP  4. Recurrent major depressive disorder, remission status unspecified (HCC) -Continue paroxetine  5. Alzheimer's disease of other onset with behavioral disturbance (HCC) -Continue memantine and Aricept    Family/ staff Communication: Discussed plan of care with resident and charge nurse.  Labs/tests ordered: None  Goals of care:   Long-term care   05/08/2019, DNP, FNP-BC [facility] [phone] (Monday-Friday 8:00 a.m. - 5:00 p.m.) [phone] (after hours) Followed up by psych NP

## 2019-07-02 ENCOUNTER — Encounter: Payer: Self-pay | Admitting: Adult Health

## 2019-07-02 ENCOUNTER — Non-Acute Institutional Stay (SKILLED_NURSING_FACILITY): Payer: Medicare Other | Admitting: Adult Health

## 2019-07-02 DIAGNOSIS — E782 Mixed hyperlipidemia: Secondary | ICD-10-CM

## 2019-07-02 DIAGNOSIS — F39 Unspecified mood [affective] disorder: Secondary | ICD-10-CM

## 2019-07-02 DIAGNOSIS — G308 Other Alzheimer's disease: Secondary | ICD-10-CM | POA: Diagnosis not present

## 2019-07-02 DIAGNOSIS — F339 Major depressive disorder, recurrent, unspecified: Secondary | ICD-10-CM | POA: Diagnosis not present

## 2019-07-02 DIAGNOSIS — Z7189 Other specified counseling: Secondary | ICD-10-CM | POA: Diagnosis not present

## 2019-07-02 DIAGNOSIS — F0281 Dementia in other diseases classified elsewhere with behavioral disturbance: Secondary | ICD-10-CM | POA: Diagnosis not present

## 2019-07-02 DIAGNOSIS — E034 Atrophy of thyroid (acquired): Secondary | ICD-10-CM

## 2019-07-02 DIAGNOSIS — F02818 Dementia in other diseases classified elsewhere, unspecified severity, with other behavioral disturbance: Secondary | ICD-10-CM

## 2019-07-02 NOTE — Progress Notes (Signed)
Location:  [facility] Room Number: 214/B Place of Service:  SNF (31) Provider:  Kenard Gower, DNP, FNP-BC  Patient Care Team: Pecola Lawless, MD as PCP - General (Internal Medicine)  Extended Emergency Contact Information Primary Emergency Contact: Robinson,Roberto Address: [address]          [place], [place] [address] D[place] of [place] Home Phone: [phone] Relation: Son Secondary Emergency Contact: Reid,Cynthia  [place] Mobile Phone: [phone] Relation: Daughter  Code Status:  Full Code  Goals of care: Advanced Directive information Advanced Directives 07/02/2019  Does Patient Have a Medical Advance Directive? Yes  Type of Advance Directive (No Data)  Does patient want to make changes to medical advance directive? No - Patient declined  Copy of Healthcare Power of Attorney in Chart? -  Would patient like information on creating a medical advance directive? -  Pre-existing out of facility DNR order (yellow form or pink MOST form) -     Chief Complaint  Patient presents with  . Advanced Directive    Advance Care Planning    HPI:  Pt is a 85 y.o. female who had advance care planning today. She declined invitation to the meeting. Care plan meeting was attended by NP, social worker, MDS coordinator, activity director and son, who attended via teleconference. She remains to be full code. Discussed medications, vital signs and weights. Son requested to have a copy of medications resident is getting. Social worker will mail him a copy with the minutes of the meeting. She was last seen by podiatry on 04/17/19. She is sometimes aggressive during care. The meeting lasted for 30 minutes.   Past Medical History:  Diagnosis Date  . Chronic kidney disease    Stage III  . Dementia with behavioral disturbance (HCC) 03/19/2015   Notated on FL2 Form from Dr. Dreama Saa [phone]  . Depression    previously saw Dr  Donell Beers  . Diabetes mellitus without complication (HCC)    11/18/15 A1c 6%  . Dyslipidemia    11/18/15 Tg 74, HDL 60, LDL 90 on statin  . HLD (hyperlipidemia)   . Hypertension   . Hypothyroidism    11/18/15 TSH 53.6, 02/07/16 TSH 9.42 @ [facility]  . MDD (major depressive disorder)    With behavioral disturbance  . Scabies    02/21/16 [facility],[place]; Rx: Permethrin cream  . Urinary tract infection due to Proteus 05/04/2019   Uniformly sensitive except to nitrofurantoin; Cipro prescribed  . Vitamin D deficiency    11/18/15 vitamin D 25-hydroxy 39   Past Surgical History:  Procedure Laterality Date  . no record     04/10/15 [facility] note: see old chart    No Known Allergies  Outpatient Encounter Medications as of 07/02/2019  Medication Sig  . acetaminophen (TYLENOL) 325 MG tablet Take 650 mg by mouth every 6 (six) hours as needed for mild pain or moderate pain.  . ARIPiprazole (ABILIFY) 5 MG tablet Take 5 mg by mouth daily.  Marland Kitchen aspirin EC 81 MG tablet Take 81 mg by mouth daily.   Marland Kitchen atorvastatin (LIPITOR) 40 MG tablet Take 40 mg by mouth at bedtime.   . bisacodyl (DULCOLAX) 10 MG suppository If not relieved by MOM, give 10 mg Bisacodyl suppositiory rectally X 1 dose in 24 hours as needed (Do not use constipation standing orders for residents with renal failure/CFR less than 30. Contact MD for orders) (Physician Order)  . Calcium Carb-Cholecalciferol (CALCIUM-VITAMIN D3) 600-400 MG-UNIT  TABS Take 1 tablet by mouth 2 (two) times daily.  . Cholecalciferol (VITAMIN D) 2000 units tablet Take 2,000 Units by mouth daily.   . divalproex (DEPAKOTE) 250 MG DR tablet Take 250 mg by mouth 2 (two) times daily.  Marland Kitchen donepezil (ARICEPT) 10 MG tablet Take 10 mg by mouth at bedtime.   . furosemide (LASIX) 20 MG tablet Take 20 mg by mouth every Monday, Wednesday, and Friday.   . levothyroxine (SYNTHROID) 200 MCG tablet Take 200 mcg by mouth daily before breakfast.   . levothyroxine (SYNTHROID) 25  MCG tablet Take 25 mcg by mouth daily before breakfast.  . magnesium hydroxide (MILK OF MAGNESIA) 400 MG/5ML suspension If no BM in 3 days, give 30 cc Milk of Magnesium p.o. x 1 dose in 24 hours as needed (Do not use standing constipation orders for residents with renal failure CFR less than 30. Contact MD for orders) (Physician Order)  . memantine (NAMENDA) 10 MG tablet Take 10 mg by mouth 2 (two) times daily.   . Multiple Vitamin (MULTI-VITAMIN DAILY PO) Take one tablet by mouth once daily   . PARoxetine (PAXIL) 10 MG tablet Take 15 mg by mouth at bedtime. Take 1-1/2 tablets to = 15 mg qd  . sennosides-docusate sodium (SENOKOT-S) 8.6-50 MG tablet Take 2 tablets by mouth daily.   . Skin Protectants, Misc. (MINERIN) CREA Apply topically to ankles twice a day for dry skin.  . Sodium Phosphates (RA SALINE ENEMA RE) If not relieved by Biscodyl suppository, give disposable Saline Enema rectally X 1 dose/24 hrs as needed (Do not use constipation standing orders for residents with renal failure/CFR less than 30. Contact MD for orders)(Physician Or   No facility-administered encounter medications on file as of 07/02/2019.    Review of Systems  GENERAL: No change in appetite, no fatigue, no weight changes, no fever, chills or weakness MOUTH and THROAT: Denies oral discomfort, gingival pain or bleeding, pain from teeth or hoarseness   RESPIRATORY: no cough, SOB, DOE, wheezing, hemoptysis CARDIAC: No chest pain, edema or palpitations GI: No abdominal pain, diarrhea, constipation, heart burn, nausea or vomiting GU: Denies dysuria, frequency, hematuria or discharge NEUROLOGICAL: Denies dizziness, syncope, numbness, or headache PSYCHIATRIC: Denies feelings of depression or anxiety. No report of hallucinations, insomnia, paranoia, or agitation ENDOCRINE: Denies polyphagia, polyuria, polydipsia, heat or cold intolerance   Immunization History  Administered Date(s) Administered  . Influenza-Unspecified  02/17/2016, 04/15/2017, 08/01/2018, 03/18/2019  . PPD Test 04/08/2016  . Pneumococcal Conjugate-13 01/11/2017  . Pneumococcal-Unspecified 02/16/2014  . Tdap 02/27/2019   Pertinent  Health Maintenance Due  Topic Date Due  . OPHTHALMOLOGY EXAM  10/12/1944  . FOOT EXAM  06/10/2019  . HEMOGLOBIN A1C  11/03/2019  . INFLUENZA VACCINE  Completed  . PNA vac Low Risk Adult  Completed  . URINE MICROALBUMIN  Discontinued  . DEXA SCAN  Discontinued   Fall Risk  01/16/2019 01/14/2018 01/10/2017 08/07/2016 04/20/2016  Falls in the past year? 1 No No Yes Yes  Number falls in past yr: 1 - - 2 or more 2 or more  Injury with Fall? 0 - - - Yes  Risk Factor Category  - - - - High Fall Risk  Risk for fall due to : History of fall(s);Impaired balance/gait;Impaired mobility - - - -  Follow up Falls evaluation completed;Falls prevention discussed;Education provided - - - -     Vitals:   07/02/19 0924  BP: 128/70  Pulse: 78  Resp: 20  Temp: 97.8 F (  36.6 C)  TempSrc: Oral  SpO2: 95%  Weight: 195 lb 9.6 oz (88.7 kg)  Height: 5\' 9"  (1.753 m)   Body mass index is 28.89 kg/m.  Physical Exam  GENERAL APPEARANCE: Well nourished. In no acute distress. [place] SKIN:  Skin is warm and dry.  MOUTH and THROAT: Lips are without lesions. Oral mucosa is moist and without lesions. Tongue is normal in shape, size, and color and without lesions RESPIRATORY: Breathing is even & unlabored, BS CTAB CARDIAC: RRR, no murmur,no extra heart sounds GI: Abdomen soft, normal BS, no masses, no tenderness EXTREMITIES:  Able to move X 4 extremities NEUROLOGICAL: There is no tremor. Speech is clear. Alert to self, disoriented to time and place. PSYCHIATRIC: Affect and behavior are appropriate  Labs reviewed: Recent Labs    08/22/18 0000 09/01/18 0000 11/01/18 0000  NA 147 145 143  K 3.9 4.1 4.1  BUN 15 15 16   CREATININE 0.8 0.8 0.9    Recent Labs    08/22/18 0000 11/01/18 0000 05/06/19 0000  WBC 3.6 4.8 3.4    NEUTROABS 1 1  --   HGB 12.2 12.9 12.5  HCT 36 40 37  PLT 129* 162 126*   Lab Results  Component Value Date   TSH 9.90 (A) 05/06/2019   Lab Results  Component Value Date   HGBA1C 6.1 05/06/2019   Lab Results  Component Value Date   CHOL 167 08/22/2018   HDL 49 08/22/2018   LDLCALC 106 08/22/2018   TRIG 58 08/22/2018     Assessment/Plan  1. Advance care planning - remains to be full code - discussed medications, vital signs and weights - copy of medication list will be maled to son  2. Hypothyroidism due to acquired atrophy of thyroid Continue Levothyroxine  3. Mixed hyperlipidemia Continue Lipitor  4. Mood disorder with psychosis (HCC) - has episodes of aggressive behavior towards caregiver, continue Divalproex and Aripiprazole, followed by psych NP  5. Recurrent major depressive disorder, remission status unspecified (HCC) - continue Paroxetine  6. Alzheimer's disease of other onset with behavioral disturbance (HCC) - continue Memantine and Aricept    Family/ staff Communication:  Discussed plan of care with son and IDT.  Labs/tests ordered:  None  Goals of care:  Long-term care    10/22/2018, DNP, FNP-BC [facility] and [facility] [phone] (Monday-Friday 8:00 a.m. - 5:00 p.m.) [phone] (after hours)

## 2019-07-06 DIAGNOSIS — Z23 Encounter for immunization: Secondary | ICD-10-CM | POA: Diagnosis not present

## 2019-07-17 ENCOUNTER — Encounter: Payer: Self-pay | Admitting: Adult Health

## 2019-07-17 ENCOUNTER — Non-Acute Institutional Stay (SKILLED_NURSING_FACILITY): Payer: Medicare Other | Admitting: Adult Health

## 2019-07-17 DIAGNOSIS — E782 Mixed hyperlipidemia: Secondary | ICD-10-CM | POA: Diagnosis not present

## 2019-07-17 DIAGNOSIS — E034 Atrophy of thyroid (acquired): Secondary | ICD-10-CM | POA: Diagnosis not present

## 2019-07-17 DIAGNOSIS — I10 Essential (primary) hypertension: Secondary | ICD-10-CM | POA: Diagnosis not present

## 2019-07-17 DIAGNOSIS — M6281 Muscle weakness (generalized): Secondary | ICD-10-CM | POA: Diagnosis not present

## 2019-07-17 DIAGNOSIS — F39 Unspecified mood [affective] disorder: Secondary | ICD-10-CM

## 2019-07-17 DIAGNOSIS — G309 Alzheimer's disease, unspecified: Secondary | ICD-10-CM

## 2019-07-17 DIAGNOSIS — I129 Hypertensive chronic kidney disease with stage 1 through stage 4 chronic kidney disease, or unspecified chronic kidney disease: Secondary | ICD-10-CM | POA: Diagnosis not present

## 2019-07-17 DIAGNOSIS — F0281 Dementia in other diseases classified elsewhere with behavioral disturbance: Secondary | ICD-10-CM

## 2019-07-17 DIAGNOSIS — R293 Abnormal posture: Secondary | ICD-10-CM | POA: Diagnosis not present

## 2019-07-17 DIAGNOSIS — F339 Major depressive disorder, recurrent, unspecified: Secondary | ICD-10-CM

## 2019-07-17 NOTE — Progress Notes (Signed)
Location:  [facility] Room Number: 214/B Place of Service:  SNF (31) Provider:  Durenda Age, DNP, FNP-BC  Patient Care Team: Hendricks Limes, MD as PCP - General (Internal Medicine)  Extended Emergency Contact Information Primary Emergency Contact: Robinson,Roberto Address: [address]          [place], [place] [address] [name] of [place] Phone: [phone] Relation: Son Secondary Emergency Contact: Reid,Cynthia  [place] Mobile Phone: [phone] Relation: Daughter  Code Status:  Full Code  Goals of care: Advanced Directive information Advanced Directives 07/17/2019  Does Patient Have a Medical Advance Directive? Yes  Type of Advance Directive (No Data)  Does patient want to make changes to medical advance directive? No - Patient declined  Copy of [place] in Chart? -  Would patient like information on creating a medical advance directive? -  Pre-existing out of facility DNR order (yellow form or pink MOST form) -     Chief Complaint  Patient presents with  . Medical Management of Chronic Issues    Routine visit of medical management    HPI:  Pt is a 85 y.o. female seen today for medical management of chronic diseases. She has PMH dementia, hypothyroidism and hypertension. She had Moderna COVID-19 vaccination on 07/06/19. No side effects noted. No reported fever, headache nor SOB. She had a recent fall and was found in front of her wheelchair. She will have OT seating assesment. She participates in restorative eating/swallowing and transfers by completing sit to stand transfers from wheelchair to parallel bars in rehab room.    Past Medical History:  Diagnosis Date  . Chronic kidney disease    Stage III  . Dementia with behavioral disturbance ([place]) 03/19/2015   Notated on FL2 Form from Dr. Agapito Games [phone]  . Depression    previously saw Dr Casimiro Needle  . Diabetes mellitus  without complication ([place])    10/23/30 A1c 6%  . Dyslipidemia    11/18/15 Tg 74, HDL 60, LDL 90 on statin  . HLD (hyperlipidemia)   . Hypertension   . Hypothyroidism    11/18/15 TSH 53.6, 02/07/16 TSH 9.42 @ [facility]  . MDD (major depressive disorder)    With behavioral disturbance  . Scabies    02/21/16 [facility],[place]; Rx: Permethrin cream  . Urinary tract infection due to Proteus 05/04/2019   Uniformly sensitive except to nitrofurantoin; Cipro prescribed  . Vitamin D deficiency    11/18/15 vitamin D 25-hydroxy 39   Past Surgical History:  Procedure Laterality Date  . no record     04/10/15 [facility] note: see old chart    No Known Allergies  Outpatient Encounter Medications as of 07/17/2019  Medication Sig  . acetaminophen (TYLENOL) 325 MG tablet Take 650 mg by mouth every 6 (six) hours as needed for mild pain or moderate pain.  . ARIPiprazole (ABILIFY) 5 MG tablet Take 5 mg by mouth daily.  Marland Kitchen aspirin EC 81 MG tablet Take 81 mg by mouth daily.   Marland Kitchen atorvastatin (LIPITOR) 40 MG tablet Take 40 mg by mouth at bedtime.   . bisacodyl (DULCOLAX) 10 MG suppository If not relieved by MOM, give 10 mg Bisacodyl suppositiory rectally X 1 dose in 24 hours as needed (Do not use constipation standing orders for residents with renal failure/CFR less than 30. Contact MD for orders) (Physician Order)  . Calcium Carb-Cholecalciferol (CALCIUM-VITAMIN D3) 600-400 MG-UNIT TABS Take 1 tablet by mouth 2 (two) times  daily.  . Cholecalciferol (VITAMIN D) 2000 units tablet Take 2,000 Units by mouth daily.   . divalproex (DEPAKOTE) 250 MG DR tablet Take 250 mg by mouth 2 (two) times daily.  Marland Kitchen donepezil (ARICEPT) 10 MG tablet Take 10 mg by mouth at bedtime.   . furosemide (LASIX) 20 MG tablet Take 20 mg by mouth every Monday, Wednesday, and Friday.   . levothyroxine (SYNTHROID) 200 MCG tablet Take 200 mcg by mouth daily before breakfast.   . levothyroxine (SYNTHROID) 25 MCG tablet Take 25 mcg by  mouth daily before breakfast.  . magnesium hydroxide (MILK OF MAGNESIA) 400 MG/5ML suspension If no BM in 3 days, give 30 cc Milk of Magnesium p.o. x 1 dose in 24 hours as needed (Do not use standing constipation orders for residents with renal failure CFR less than 30. Contact MD for orders) (Physician Order)  . memantine (NAMENDA) 10 MG tablet Take 10 mg by mouth 2 (two) times daily.   . Multiple Vitamin (MULTI-VITAMIN DAILY PO) Take one tablet by mouth once daily   . PARoxetine (PAXIL) 10 MG tablet Take 15 mg by mouth at bedtime. Take 1-1/2 tablets to = 15 mg qd  . sennosides-docusate sodium (SENOKOT-S) 8.6-50 MG tablet Take 2 tablets by mouth daily.   . Skin Protectants, Misc. (MINERIN) CREA Apply topically to ankles twice a day for dry skin.  . Sodium Phosphates (RA SALINE ENEMA RE) If not relieved by Biscodyl suppository, give disposable Saline Enema rectally X 1 dose/24 hrs as needed (Do not use constipation standing orders for residents with renal failure/CFR less than 30. Contact MD for orders)(Physician Or   No facility-administered encounter medications on file as of 07/17/2019.    Review of Systems  GENERAL: No change in appetite, no fatigue, no weight changes, no fever, chills or weakness MOUTH and THROAT: Denies oral discomfort, gingival pain or bleeding RESPIRATORY: no cough, SOB, DOE, wheezing, hemoptysis CARDIAC: No chest pain or palpitations GI: No abdominal pain, diarrhea, constipation, heart burn, nausea or vomiting GU: Denies dysuria, frequency, hematuria or discharge NEUROLOGICAL: Denies dizziness, syncope, numbness, or headache PSYCHIATRIC: +agitation   Immunization History  Administered Date(s) Administered  . Influenza-Unspecified 02/17/2016, 04/15/2017, 08/01/2018, 03/18/2019  . Moderna SARS-COVID-2 Vaccination 07/06/2019  . PPD Test 04/08/2016  . Pneumococcal Conjugate-13 01/11/2017  . Pneumococcal-Unspecified 02/16/2014  . Tdap 02/27/2019   Pertinent   Health Maintenance Due  Topic Date Due  . OPHTHALMOLOGY EXAM  10/12/1944  . FOOT EXAM  06/10/2019  . HEMOGLOBIN A1C  11/03/2019  . INFLUENZA VACCINE  Completed  . PNA vac Low Risk Adult  Completed  . URINE MICROALBUMIN  Discontinued  . DEXA SCAN  Discontinued   Fall Risk  01/16/2019 01/14/2018 01/10/2017 08/07/2016 04/20/2016  Falls in the past year? 1 No No Yes Yes  Number falls in past yr: 1 - - 2 or more 2 or more  Injury with Fall? 0 - - - Yes  Risk Factor Category  - - - - High Fall Risk  Risk for fall due to : History of fall(s);Impaired balance/gait;Impaired mobility - - - -  Follow up Falls evaluation completed;Falls prevention discussed;Education provided - - - -     Vitals:   07/17/19 1133  BP: 138/74  Pulse: 78  Resp: 20  Temp: (!) 97 F (36.1 C)  TempSrc: Oral  SpO2: 95%  Weight: 195 lb 9.6 oz (88.7 kg)  Height: 5\' 9"  (1.753 m)   Body mass index is 28.89 kg/m.  Physical  Exam  GENERAL APPEARANCE: Well nourished. In no acute distress. Normal body habitus SKIN:  Skin is warm and dry.  MOUTH and THROAT: Lips are without lesions. Oral mucosa is moist and without lesions. Tongue is normal in shape, size, and color and without lesions RESPIRATORY: Breathing is even & unlabored, BS CTAB CARDIAC: RRR, no murmur,no extra heart sounds GI: Abdomen soft, normal BS, no masses, no tenderness EXTREMITIES:  Able to move X 4 extremities NEUROLOGICAL: There is no tremor. Speech is clear. Alert to self, disoriented to time and place. PSYCHIATRIC:  Affect and behavior are appropriate  Labs reviewed: Recent Labs    08/22/18 0000 09/01/18 0000 11/01/18 0000  NA 147 145 143  K 3.9 4.1 4.1  BUN 15 15 16   CREATININE 0.8 0.8 0.9    Recent Labs    08/22/18 0000 11/01/18 0000 05/06/19 0000  WBC 3.6 4.8 3.4  NEUTROABS 1 1  --   HGB 12.2 12.9 12.5  HCT 36 40 37  PLT 129* 162 126*   Lab Results  Component Value Date   TSH 9.90 (A) 05/06/2019   Lab Results  Component  Value Date   HGBA1C 6.1 05/06/2019   Lab Results  Component Value Date   CHOL 167 08/22/2018   HDL 49 08/22/2018   LDLCALC 106 08/22/2018   TRIG 58 08/22/2018     Assessment/Plan  1. Essential hypertension - controlled, continue Lasix  2. Hypothyroidism due to acquired atrophy of thyroid - Continue levothyroxine  3. Mixed hyperlipidemia Lab Results  Component Value Date   CHOL 167 08/22/2018   HDL 49 08/22/2018   LDLCALC 106 08/22/2018   TRIG 58 08/22/2018   -Continue Lipitor  4. Major depression, recurrent, chronic (HCC) -Stable, continue paroxetine  5. Mood disorder with psychosis (HCC) - has occasional agitation, continue divalproex and aripiprazole, followed up by psych NP  6. Alzheimer's dementia with behavioral disturbance, unspecified timing of dementia onset (HCC) -Continue donepezil and memantine, supportive care and fall precautions   Family/ staff Communication: Discussed plan of care with resident and charge nurse.  Labs/tests ordered:  None  Goals of care:   Long-term care   10/22/2018, DNP, FNP-BC [facility] and [facility] [phone] (Monday-Friday 8:00 a.m. - 5:00 p.m.) [phone] (after hours)

## 2019-07-20 DIAGNOSIS — R293 Abnormal posture: Secondary | ICD-10-CM | POA: Diagnosis not present

## 2019-07-20 DIAGNOSIS — I129 Hypertensive chronic kidney disease with stage 1 through stage 4 chronic kidney disease, or unspecified chronic kidney disease: Secondary | ICD-10-CM | POA: Diagnosis not present

## 2019-07-20 DIAGNOSIS — M6281 Muscle weakness (generalized): Secondary | ICD-10-CM | POA: Diagnosis not present

## 2019-07-21 DIAGNOSIS — I129 Hypertensive chronic kidney disease with stage 1 through stage 4 chronic kidney disease, or unspecified chronic kidney disease: Secondary | ICD-10-CM | POA: Diagnosis not present

## 2019-07-21 DIAGNOSIS — M6281 Muscle weakness (generalized): Secondary | ICD-10-CM | POA: Diagnosis not present

## 2019-07-21 DIAGNOSIS — R293 Abnormal posture: Secondary | ICD-10-CM | POA: Diagnosis not present

## 2019-07-22 DIAGNOSIS — I129 Hypertensive chronic kidney disease with stage 1 through stage 4 chronic kidney disease, or unspecified chronic kidney disease: Secondary | ICD-10-CM | POA: Diagnosis not present

## 2019-07-22 DIAGNOSIS — R293 Abnormal posture: Secondary | ICD-10-CM | POA: Diagnosis not present

## 2019-07-22 DIAGNOSIS — M6281 Muscle weakness (generalized): Secondary | ICD-10-CM | POA: Diagnosis not present

## 2019-07-23 DIAGNOSIS — I129 Hypertensive chronic kidney disease with stage 1 through stage 4 chronic kidney disease, or unspecified chronic kidney disease: Secondary | ICD-10-CM | POA: Diagnosis not present

## 2019-07-23 DIAGNOSIS — R293 Abnormal posture: Secondary | ICD-10-CM | POA: Diagnosis not present

## 2019-07-23 DIAGNOSIS — M6281 Muscle weakness (generalized): Secondary | ICD-10-CM | POA: Diagnosis not present

## 2019-07-24 DIAGNOSIS — M6281 Muscle weakness (generalized): Secondary | ICD-10-CM | POA: Diagnosis not present

## 2019-07-24 DIAGNOSIS — I129 Hypertensive chronic kidney disease with stage 1 through stage 4 chronic kidney disease, or unspecified chronic kidney disease: Secondary | ICD-10-CM | POA: Diagnosis not present

## 2019-07-24 DIAGNOSIS — F39 Unspecified mood [affective] disorder: Secondary | ICD-10-CM | POA: Diagnosis not present

## 2019-07-24 DIAGNOSIS — R293 Abnormal posture: Secondary | ICD-10-CM | POA: Diagnosis not present

## 2019-07-24 DIAGNOSIS — F329 Major depressive disorder, single episode, unspecified: Secondary | ICD-10-CM | POA: Diagnosis not present

## 2019-07-24 DIAGNOSIS — F0390 Unspecified dementia without behavioral disturbance: Secondary | ICD-10-CM | POA: Diagnosis not present

## 2019-07-27 DIAGNOSIS — I129 Hypertensive chronic kidney disease with stage 1 through stage 4 chronic kidney disease, or unspecified chronic kidney disease: Secondary | ICD-10-CM | POA: Diagnosis not present

## 2019-07-27 DIAGNOSIS — M6281 Muscle weakness (generalized): Secondary | ICD-10-CM | POA: Diagnosis not present

## 2019-07-27 DIAGNOSIS — R293 Abnormal posture: Secondary | ICD-10-CM | POA: Diagnosis not present

## 2019-07-28 ENCOUNTER — Encounter: Payer: Self-pay | Admitting: Internal Medicine

## 2019-07-28 ENCOUNTER — Non-Acute Institutional Stay (SKILLED_NURSING_FACILITY): Payer: Medicare Other | Admitting: Internal Medicine

## 2019-07-28 DIAGNOSIS — I10 Essential (primary) hypertension: Secondary | ICD-10-CM | POA: Diagnosis not present

## 2019-07-28 DIAGNOSIS — E034 Atrophy of thyroid (acquired): Secondary | ICD-10-CM

## 2019-07-28 DIAGNOSIS — N183 Chronic kidney disease, stage 3 unspecified: Secondary | ICD-10-CM

## 2019-07-28 DIAGNOSIS — E1322 Other specified diabetes mellitus with diabetic chronic kidney disease: Secondary | ICD-10-CM

## 2019-07-28 DIAGNOSIS — I129 Hypertensive chronic kidney disease with stage 1 through stage 4 chronic kidney disease, or unspecified chronic kidney disease: Secondary | ICD-10-CM

## 2019-07-28 DIAGNOSIS — D696 Thrombocytopenia, unspecified: Secondary | ICD-10-CM | POA: Diagnosis not present

## 2019-07-28 DIAGNOSIS — R293 Abnormal posture: Secondary | ICD-10-CM | POA: Diagnosis not present

## 2019-07-28 DIAGNOSIS — M6281 Muscle weakness (generalized): Secondary | ICD-10-CM | POA: Diagnosis not present

## 2019-07-28 NOTE — Assessment & Plan Note (Signed)
BP controlled; no change in antihypertensive medications  

## 2019-07-28 NOTE — Assessment & Plan Note (Signed)
A1c is current and prediabetic at 6.1%.  No change indicated

## 2019-07-28 NOTE — Progress Notes (Signed)
   NURSING HOME LOCATION:  [facility] ROOM NUMBER:  214/B  CODE STATUS:  Full Code  PCP:  Pecola Lawless MD.  This is a nursing facility follow up of chronic medical diagnoses; but acute change in condition was reported today.    Interim medical record and care since last [facility] visit was updated with review of diagnostic studies and change in clinical status since last visit were documented.  HPI: At 01:45 hours the patient was observed sitting on the floor by the CNA rounding. The patient was unable to explain why she was on the floor.  No injury was clinically apparent.  Resident was returned to bed by staff.  Vital signs and neuro checks were continued.    She is a permanent resident of facility with medical diagnoses of vitamin D deficiency, major depression, hypothyroidism, essential hypertension, dyslipidemia, diabetes with vascular complications, CKD stage III, and history of behavioral disturbance.    She is on Depakote for mood disorder; she has had significant thrombocytopenia in the past & Depakote dose has been adjusted.  On 05/06/2019 platelet count was 126,000, mildly reduced. She is listed as having diabetes with CKD.  On November 18 her A1c was 6.1%.  This is despite being on Abilify. She is on 225 mcg of L-thyroxine; on November 18 TSH was mild to moderately elevated at 9.90.  Review of systems: Staff reports no bleeding dyscrasias.  She can provide no meaningful history.  Occasionally she would parrot a word spoken by the examiner.   Physical exam:  Pertinent or positive findings: She appears younger than her stated age. She stares ahead blankly but will exhibit a look of alarm with a wide-eyed stare when addressed.  Grade 1/2 systolic murmur is loudest at the right base.The second heart sound was slightly increased.  Breath sounds are decreased.  I could not get her to take deep breaths to optimally assess lung function.  Pedal pulses are decreased. She  could not follow commands.  Specifically I could not get her to open her mouth to evaluate the oral tissues.  She also would not oppose my hand to test strength. The NP student did note her to have adequate strength based on her observations.  General appearance: Adequately nourished; no acute distress, increased work of breathing is present.   Lymphatic: No lymphadenopathy about the head, neck, axilla. Eyes: No conjunctival inflammation or lid edema is present. There is no scleral icterus. Ears:  External ear exam shows no significant lesions or deformities.   Nose:  External nasal examination shows no deformity or inflammation. Nasal mucosa are pink and moist without lesions, exudates Oral exam:  Lips and gums are healthy appearing. There is no oropharyngeal erythema or exudate. Neck:  No thyromegaly, masses, tenderness noted.    Heart:  Normal rate and regular rhythm. S1 normal without gallop,click, rub .  Lungs:  without wheezes, rhonchi, rales, rubs. Abdomen: Bowel sounds are normal. Abdomen is soft and nontender with no organomegaly, hernias, masses. GU: Deferred  Extremities:  No cyanosis, clubbing, edema  Neurologic exam : Balance, Rhomberg, finger to nose testing could not be completed due to clinical state Skin: Warm & dry w/o tenting. No significant lesions or rash.  See summary under each active problem in the Problem List with associated updated therapeutic plan

## 2019-07-28 NOTE — Assessment & Plan Note (Signed)
No bleeding dyscrasias reported, platelet count only mildly reduced at 126,000 on 05/06/2019.

## 2019-07-28 NOTE — Assessment & Plan Note (Signed)
05/06/2019 TSH 9.90; update indicated

## 2019-07-28 NOTE — Patient Instructions (Signed)
See assessment and plan under each diagnosis in the problem list and acutely for this visit 

## 2019-07-29 DIAGNOSIS — M6281 Muscle weakness (generalized): Secondary | ICD-10-CM | POA: Diagnosis not present

## 2019-07-29 DIAGNOSIS — E039 Hypothyroidism, unspecified: Secondary | ICD-10-CM | POA: Diagnosis not present

## 2019-07-29 DIAGNOSIS — I129 Hypertensive chronic kidney disease with stage 1 through stage 4 chronic kidney disease, or unspecified chronic kidney disease: Secondary | ICD-10-CM | POA: Diagnosis not present

## 2019-07-29 DIAGNOSIS — R293 Abnormal posture: Secondary | ICD-10-CM | POA: Diagnosis not present

## 2019-07-29 LAB — TSH: TSH: 9.89 — AB (ref 0.41–5.90)

## 2019-07-30 DIAGNOSIS — M6281 Muscle weakness (generalized): Secondary | ICD-10-CM | POA: Diagnosis not present

## 2019-07-30 DIAGNOSIS — I129 Hypertensive chronic kidney disease with stage 1 through stage 4 chronic kidney disease, or unspecified chronic kidney disease: Secondary | ICD-10-CM | POA: Diagnosis not present

## 2019-07-30 DIAGNOSIS — R293 Abnormal posture: Secondary | ICD-10-CM | POA: Diagnosis not present

## 2019-07-31 DIAGNOSIS — R293 Abnormal posture: Secondary | ICD-10-CM | POA: Diagnosis not present

## 2019-07-31 DIAGNOSIS — M6281 Muscle weakness (generalized): Secondary | ICD-10-CM | POA: Diagnosis not present

## 2019-07-31 DIAGNOSIS — I129 Hypertensive chronic kidney disease with stage 1 through stage 4 chronic kidney disease, or unspecified chronic kidney disease: Secondary | ICD-10-CM | POA: Diagnosis not present

## 2019-08-03 DIAGNOSIS — R293 Abnormal posture: Secondary | ICD-10-CM | POA: Diagnosis not present

## 2019-08-03 DIAGNOSIS — M6281 Muscle weakness (generalized): Secondary | ICD-10-CM | POA: Diagnosis not present

## 2019-08-03 DIAGNOSIS — Z23 Encounter for immunization: Secondary | ICD-10-CM | POA: Diagnosis not present

## 2019-08-03 DIAGNOSIS — I129 Hypertensive chronic kidney disease with stage 1 through stage 4 chronic kidney disease, or unspecified chronic kidney disease: Secondary | ICD-10-CM | POA: Diagnosis not present

## 2019-08-04 DIAGNOSIS — I129 Hypertensive chronic kidney disease with stage 1 through stage 4 chronic kidney disease, or unspecified chronic kidney disease: Secondary | ICD-10-CM | POA: Diagnosis not present

## 2019-08-04 DIAGNOSIS — M6281 Muscle weakness (generalized): Secondary | ICD-10-CM | POA: Diagnosis not present

## 2019-08-04 DIAGNOSIS — R293 Abnormal posture: Secondary | ICD-10-CM | POA: Diagnosis not present

## 2019-08-05 DIAGNOSIS — R293 Abnormal posture: Secondary | ICD-10-CM | POA: Diagnosis not present

## 2019-08-05 DIAGNOSIS — M6281 Muscle weakness (generalized): Secondary | ICD-10-CM | POA: Diagnosis not present

## 2019-08-05 DIAGNOSIS — I129 Hypertensive chronic kidney disease with stage 1 through stage 4 chronic kidney disease, or unspecified chronic kidney disease: Secondary | ICD-10-CM | POA: Diagnosis not present

## 2019-08-06 DIAGNOSIS — M6281 Muscle weakness (generalized): Secondary | ICD-10-CM | POA: Diagnosis not present

## 2019-08-06 DIAGNOSIS — R293 Abnormal posture: Secondary | ICD-10-CM | POA: Diagnosis not present

## 2019-08-06 DIAGNOSIS — I129 Hypertensive chronic kidney disease with stage 1 through stage 4 chronic kidney disease, or unspecified chronic kidney disease: Secondary | ICD-10-CM | POA: Diagnosis not present

## 2019-08-09 DIAGNOSIS — R293 Abnormal posture: Secondary | ICD-10-CM | POA: Diagnosis not present

## 2019-08-09 DIAGNOSIS — I129 Hypertensive chronic kidney disease with stage 1 through stage 4 chronic kidney disease, or unspecified chronic kidney disease: Secondary | ICD-10-CM | POA: Diagnosis not present

## 2019-08-09 DIAGNOSIS — M6281 Muscle weakness (generalized): Secondary | ICD-10-CM | POA: Diagnosis not present

## 2019-08-10 DIAGNOSIS — R293 Abnormal posture: Secondary | ICD-10-CM | POA: Diagnosis not present

## 2019-08-10 DIAGNOSIS — M6281 Muscle weakness (generalized): Secondary | ICD-10-CM | POA: Diagnosis not present

## 2019-08-10 DIAGNOSIS — I129 Hypertensive chronic kidney disease with stage 1 through stage 4 chronic kidney disease, or unspecified chronic kidney disease: Secondary | ICD-10-CM | POA: Diagnosis not present

## 2019-08-11 DIAGNOSIS — I129 Hypertensive chronic kidney disease with stage 1 through stage 4 chronic kidney disease, or unspecified chronic kidney disease: Secondary | ICD-10-CM | POA: Diagnosis not present

## 2019-08-11 DIAGNOSIS — M6281 Muscle weakness (generalized): Secondary | ICD-10-CM | POA: Diagnosis not present

## 2019-08-11 DIAGNOSIS — R293 Abnormal posture: Secondary | ICD-10-CM | POA: Diagnosis not present

## 2019-08-12 DIAGNOSIS — I129 Hypertensive chronic kidney disease with stage 1 through stage 4 chronic kidney disease, or unspecified chronic kidney disease: Secondary | ICD-10-CM | POA: Diagnosis not present

## 2019-08-12 DIAGNOSIS — R293 Abnormal posture: Secondary | ICD-10-CM | POA: Diagnosis not present

## 2019-08-12 DIAGNOSIS — M6281 Muscle weakness (generalized): Secondary | ICD-10-CM | POA: Diagnosis not present

## 2019-08-13 DIAGNOSIS — R293 Abnormal posture: Secondary | ICD-10-CM | POA: Diagnosis not present

## 2019-08-13 DIAGNOSIS — M6281 Muscle weakness (generalized): Secondary | ICD-10-CM | POA: Diagnosis not present

## 2019-08-13 DIAGNOSIS — I129 Hypertensive chronic kidney disease with stage 1 through stage 4 chronic kidney disease, or unspecified chronic kidney disease: Secondary | ICD-10-CM | POA: Diagnosis not present

## 2019-08-17 ENCOUNTER — Encounter: Payer: Self-pay | Admitting: Adult Health

## 2019-08-17 ENCOUNTER — Non-Acute Institutional Stay (SKILLED_NURSING_FACILITY): Payer: Medicare Other | Admitting: Adult Health

## 2019-08-17 DIAGNOSIS — F339 Major depressive disorder, recurrent, unspecified: Secondary | ICD-10-CM

## 2019-08-17 DIAGNOSIS — R600 Localized edema: Secondary | ICD-10-CM | POA: Diagnosis not present

## 2019-08-17 DIAGNOSIS — E034 Atrophy of thyroid (acquired): Secondary | ICD-10-CM | POA: Diagnosis not present

## 2019-08-17 DIAGNOSIS — F0281 Dementia in other diseases classified elsewhere with behavioral disturbance: Secondary | ICD-10-CM | POA: Diagnosis not present

## 2019-08-17 DIAGNOSIS — E782 Mixed hyperlipidemia: Secondary | ICD-10-CM | POA: Diagnosis not present

## 2019-08-17 DIAGNOSIS — F39 Unspecified mood [affective] disorder: Secondary | ICD-10-CM | POA: Diagnosis not present

## 2019-08-17 DIAGNOSIS — G309 Alzheimer's disease, unspecified: Secondary | ICD-10-CM | POA: Diagnosis not present

## 2019-08-17 NOTE — Progress Notes (Signed)
Location:  [facility] Room Number: 214-B Place of Service:  SNF (31) Provider:  Kenard Gower, DNP, FNP-BC  Patient Care Team: Pecola Lawless, MD as PCP - General (Internal Medicine)  Extended Emergency Contact Information Primary Emergency Contact: Robinson,Roberto Address: [address]          [place], [place] [address] [place] Home Phone: [phone] Relation: Son Secondary Emergency Contact: Reid,Cynthia  [place] Mobile Phone: [phone] Relation: Daughter  Code Status:  FULL CODE  Goals of care: Advanced Directive information Advanced Directives 07/28/2019  Does Patient Have a Medical Advance Directive? Yes  Type of Advance Directive (No Data)  Does patient want to make changes to medical advance directive? No - Patient declined  Copy of Healthcare Power of Attorney in Chart? -  Would patient like information on creating a medical advance directive? -  Pre-existing out of facility DNR order (yellow form or pink MOST form) -     Chief Complaint  Patient presents with  . Medical Management of Chronic Issues    Routine [facility] visit    HPI:  Pt is an 85 y.o. female seen today for medical management of chronic diseases.  She is a long-term care resident of [facility]. She has PMH of dementia, hypothyroidism and hypertension. On 08/03/19, she has received her second Moderna COVID-19 vaccine. No reported side effects. She participates in restorative eating/swallowing. She requires verbal cuing to be in upright position to complete meals because of aspiration precautions. She, also, participates in restorative transfers wherein she does sit to stand transfers from wheelchair to parallel bars to prevent loss of function. Depakote was changed to capsule sprinkles so it can be mixed with applesauce. Resident holds pill in her mouth. There are occasional reported episodes of  agitations.   Past Medical History:  Diagnosis Date  . Chronic kidney disease    Stage III  . Dementia with behavioral disturbance (HCC) 03/19/2015   Notated on FL2 Form from Dr. Dreama Saa [phone]  . Depression    previously saw Dr Donell Beers  . Diabetes mellitus without complication (HCC)    11/18/15 A1c 6%  . Dyslipidemia    11/18/15 Tg 74, HDL 60, LDL 90 on statin  . Hypertension   . Hypothyroidism    11/18/15 TSH 53.6, 02/07/16 TSH 9.42 @ [facility]  . MDD (major depressive disorder)    With behavioral disturbance  . Scabies    02/21/16 [facility],[place]; Rx: Permethrin cream  . Urinary tract infection due to Proteus 05/04/2019   Uniformly sensitive except to nitrofurantoin; Cipro prescribed  . Vitamin D deficiency    11/18/15 vitamin D 25-hydroxy 39   Past Surgical History:  Procedure Laterality Date  . no record     04/10/15 [facility] note: see old chart    No Known Allergies  Outpatient Encounter Medications as of 08/17/2019  Medication Sig  . acetaminophen (TYLENOL) 325 MG tablet Take 650 mg by mouth every 6 (six) hours as needed for mild pain or moderate pain.  . ARIPiprazole (ABILIFY) 5 MG tablet Take 5 mg by mouth daily.  Marland Kitchen aspirin EC 81 MG tablet Take 81 mg by mouth daily.   Marland Kitchen atorvastatin (LIPITOR) 40 MG tablet Take 40 mg by mouth at bedtime.   . bisacodyl (DULCOLAX) 10 MG suppository If not relieved by MOM, give 10 mg Bisacodyl suppositiory rectally X 1 dose in 24 hours as needed (Do not use constipation  standing orders for residents with renal failure/CFR less than 30. Contact MD for orders) (Physician Order)  . Calcium Carb-Cholecalciferol (CALCIUM-VITAMIN D3) 600-400 MG-UNIT TABS Take 1 tablet by mouth 2 (two) times daily.  . Cholecalciferol (VITAMIN D) 2000 units tablet Take 2,000 Units by mouth daily.   . divalproex (DEPAKOTE) 250 MG DR tablet Take 250 mg by mouth 2 (two) times daily.  Marland Kitchen donepezil (ARICEPT) 10 MG tablet Take 10 mg by mouth at  bedtime.   . furosemide (LASIX) 20 MG tablet Take 20 mg by mouth every Monday, Wednesday, and Friday.   . levothyroxine (SYNTHROID) 200 MCG tablet Take 200 mcg by mouth daily before breakfast.   . levothyroxine (SYNTHROID) 25 MCG tablet Take 25 mcg by mouth daily before breakfast.  . magnesium hydroxide (MILK OF MAGNESIA) 400 MG/5ML suspension If no BM in 3 days, give 30 cc Milk of Magnesium p.o. x 1 dose in 24 hours as needed (Do not use standing constipation orders for residents with renal failure CFR less than 30. Contact MD for orders) (Physician Order)  . memantine (NAMENDA) 10 MG tablet Take 10 mg by mouth 2 (two) times daily.   . Multiple Vitamin (MULTI-VITAMIN DAILY PO) Take one tablet by mouth once daily   . PARoxetine (PAXIL) 10 MG tablet Take 15 mg by mouth at bedtime. Take 1-1/2 tablets to = 15 mg qd  . sennosides-docusate sodium (SENOKOT-S) 8.6-50 MG tablet Take 2 tablets by mouth daily.   . Skin Protectants, Misc. (MINERIN) CREA Apply topically to ankles twice a day for dry skin.  . Sodium Phosphates (RA SALINE ENEMA RE) If not relieved by Biscodyl suppository, give disposable Saline Enema rectally X 1 dose/24 hrs as needed (Do not use constipation standing orders for residents with renal failure/CFR less than 30. Contact MD for orders)(Physician Or   No facility-administered encounter medications on file as of 08/17/2019.    Review of Systems  GENERAL: No fever, chills or weakness MOUTH and THROAT: Denies oral discomfort, gingival pain or bleeding, pain from teeth or hoarseness   RESPIRATORY: no cough, SOB, DOE, wheezing, hemoptysis CARDIAC: No chest pain, edema or palpitations GI: No abdominal pain, diarrhea, constipation, heart burn, nausea or vomiting GU: Denies dysuria, frequency, hematuria or discharge NEUROLOGICAL: Denies dizziness, syncope, numbness, or headache PSYCHIATRIC: +agitation  Immunization History  Administered Date(s) Administered  . Influenza-Unspecified  02/17/2016, 04/15/2017, 08/01/2018, 03/18/2019  . Moderna SARS-COVID-2 Vaccination 07/06/2019, 08/03/2019  . PPD Test 04/08/2016  . Pneumococcal Conjugate-13 01/11/2017  . Pneumococcal-Unspecified 02/16/2014  . Tdap 02/27/2019   Pertinent  Health Maintenance Due  Topic Date Due  . OPHTHALMOLOGY EXAM  08/17/2019 (Originally 10/12/1944)  . HEMOGLOBIN A1C  11/03/2019  . FOOT EXAM  04/16/2020  . INFLUENZA VACCINE  Completed  . PNA vac Low Risk Adult  Completed  . URINE MICROALBUMIN  Discontinued  . DEXA SCAN  Discontinued   Fall Risk  01/16/2019 01/14/2018 01/10/2017 08/07/2016 04/20/2016  Falls in the past year? 1 No No Yes Yes  Number falls in past yr: 1 - - 2 or more 2 or more  Injury with Fall? 0 - - - Yes  Risk Factor Category  - - - - High Fall Risk  Risk for fall due to : History of fall(s);Impaired balance/gait;Impaired mobility - - - -  Follow up Falls evaluation completed;Falls prevention discussed;Education provided - - - -     Vitals:   08/17/19 1148  BP: 128/85  Pulse: 81  Resp: 17  Temp: 97.8  F (36.6 C)  TempSrc: Oral  Weight: 191 lb 6.4 oz (86.8 kg)  Height: 5\' 9"  (1.753 m)   Body mass index is 28.26 kg/m.  Physical Exam  GENERAL APPEARANCE: Well nourished. In no acute distress. Obese SKIN:  Skin is warm and dry.  MOUTH and THROAT: Lips are without lesions. Oral mucosa is moist and without lesions.  RESPIRATORY: Breathing is even & unlabored, BS CTAB CARDIAC: RRR, no murmur,no extra heart sounds GI: Abdomen soft, normal BS, no masses, no tenderness EXTREMITIES:  Able to move X 4 extremities NEUROLOGICAL: There is no tremor. Speech is clear. Alert to self, disoriented to time and place. PSYCHIATRIC:  Affect and behavior are appropriate  Labs reviewed: Recent Labs    08/22/18 0000 09/01/18 0000 11/01/18 0000  NA 147 145 143  K 3.9 4.1 4.1  BUN 15 15 16   CREATININE 0.8 0.8 0.9    Recent Labs    08/22/18 0000 11/01/18 0000 05/06/19 0000  WBC 3.6  4.8 3.4  NEUTROABS 1 1  --   HGB 12.2 12.9 12.5  HCT 36 40 37  PLT 129* 162 126*   Lab Results  Component Value Date   TSH 9.89 (A) 07/29/2019   Lab Results  Component Value Date   HGBA1C 6.1 05/06/2019   Lab Results  Component Value Date   CHOL 167 08/22/2018   HDL 49 08/22/2018   LDLCALC 106 08/22/2018   TRIG 58 08/22/2018    Assessment/Plan  1. Lower extremity edema - has trace edema, continue Lasix 20 mg on MWF  2. Hypothyroidism due to acquired atrophy of thyroid Lab Results  Component Value Date   TSH 9.89 (A) 07/29/2019   - continue Levothyroxine 225 mcg daily  3. Mixed hyperlipidemia Lab Results  Component Value Date   CHOL 167 08/22/2018   HDL 49 08/22/2018   LDLCALC 106 08/22/2018   TRIG 58 08/22/2018   - continue Lipitor  4. Mood disorder with psychosis (HCC) - has occasional agitation during care, continue Depakote and Aripiprazole - followed by psych NP  5. Major depression, recurrent, chronic (HCC) - stable, continue Paroxetine  6. Alzheimer's dementia with behavioral disturbance - continue Donepezil and Memantine - continue supportive care and fall precautions - continue restorative programs   Family/ staff Communication:  Discussed plan of care with resident.  Labs/tests ordered:  None  Goals of care:   Long-term care.   10/22/2018, DNP, FNP-BC [facility] [phone] (Monday-Friday 8:00 a.m. - 5:00 p.m.) [phone] (after hours)

## 2019-09-02 DIAGNOSIS — F39 Unspecified mood [affective] disorder: Secondary | ICD-10-CM | POA: Diagnosis not present

## 2019-09-02 DIAGNOSIS — F329 Major depressive disorder, single episode, unspecified: Secondary | ICD-10-CM | POA: Diagnosis not present

## 2019-09-02 DIAGNOSIS — F0390 Unspecified dementia without behavioral disturbance: Secondary | ICD-10-CM | POA: Diagnosis not present

## 2019-09-17 ENCOUNTER — Encounter: Payer: Self-pay | Admitting: Adult Health

## 2019-09-17 ENCOUNTER — Non-Acute Institutional Stay (SKILLED_NURSING_FACILITY): Payer: Medicare Other | Admitting: Adult Health

## 2019-09-17 DIAGNOSIS — F339 Major depressive disorder, recurrent, unspecified: Secondary | ICD-10-CM

## 2019-09-17 DIAGNOSIS — E034 Atrophy of thyroid (acquired): Secondary | ICD-10-CM | POA: Diagnosis not present

## 2019-09-17 DIAGNOSIS — E1322 Other specified diabetes mellitus with diabetic chronic kidney disease: Secondary | ICD-10-CM | POA: Diagnosis not present

## 2019-09-17 DIAGNOSIS — I129 Hypertensive chronic kidney disease with stage 1 through stage 4 chronic kidney disease, or unspecified chronic kidney disease: Secondary | ICD-10-CM

## 2019-09-17 DIAGNOSIS — R600 Localized edema: Secondary | ICD-10-CM | POA: Diagnosis not present

## 2019-09-17 DIAGNOSIS — N183 Chronic kidney disease, stage 3 unspecified: Secondary | ICD-10-CM | POA: Diagnosis not present

## 2019-09-17 DIAGNOSIS — E782 Mixed hyperlipidemia: Secondary | ICD-10-CM

## 2019-09-17 DIAGNOSIS — F39 Unspecified mood [affective] disorder: Secondary | ICD-10-CM

## 2019-09-17 NOTE — Progress Notes (Signed)
Location:  [facility] Room Number: 214-B Place of Service:  SNF (31) Provider:  Kenard Gower, DNP, FNP-BC  Patient Care Team: Pecola Lawless, MD as PCP - General (Internal Medicine)  Extended Emergency Contact Information Primary Emergency Contact: Robinson,Roberto Address: [address]          [place], [place] [address] [place] Home Phone: [phone] Relation: Son Secondary Emergency Contact: Reid,Cynthia  [place] Mobile Phone: [phone] Relation: Daughter  Code Status:  FULL CODE  Goals of care: Advanced Directive information Advanced Directives 07/28/2019  Does Patient Have a Medical Advance Directive? Yes  Type of Advance Directive (No Data)  Does patient want to make changes to medical advance directive? No - Patient declined  Copy of Healthcare Power of Attorney in Chart? -  Would patient like information on creating a medical advance directive? -  Pre-existing out of facility DNR order (yellow form or pink MOST form) -     Chief Complaint  Patient presents with  . Medical Management of Chronic Issues    Routine [facility] visit  . Quality Metric Gaps    Needs diabetic eye exam.     HPI:  Pt is an 85 y.o. female seen today for medical management of chronic diseases. She is a long-term care resident of [facility]. She has a PMH of dementia, hypothyroidism and hypertension. He is currently having restorative program - eating/swallowing and transfer. Staff reports occasional agitation/resistive to care. She continues to take Divalproex DR for mood disorder and Paroxetine and Aripiprazole for depression. Latest tsh 9.89 (07/29/19), below 10. She continues to take Levothyroxine 225 mcg daily   Past Medical History:  Diagnosis Date  . Chronic kidney disease    Stage III  . Dementia with behavioral disturbance (HCC) 03/19/2015   Notated on FL2 Form from Dr. Dreama Saa  [phone]  . Depression    previously saw Dr Donell Beers  . Diabetes mellitus without complication (HCC)    11/18/15 A1c 6%  . Dyslipidemia    11/18/15 Tg 74, HDL 60, LDL 90 on statin  . Hypertension   . Hypothyroidism    11/18/15 TSH 53.6, 02/07/16 TSH 9.42 @ [facility]  . MDD (major depressive disorder)    With behavioral disturbance  . Scabies    02/21/16 [facility],[place]; Rx: Permethrin cream  . Urinary tract infection due to Proteus 05/04/2019   Uniformly sensitive except to nitrofurantoin; Cipro prescribed  . Vitamin D deficiency    11/18/15 vitamin D 25-hydroxy 39   Past Surgical History:  Procedure Laterality Date  . no record     04/10/15 [facility] note: see old chart    No Known Allergies  Outpatient Encounter Medications as of 09/17/2019  Medication Sig  . acetaminophen (TYLENOL) 325 MG tablet Take 650 mg by mouth every 6 (six) hours as needed for mild pain or moderate pain.  . ARIPiprazole (ABILIFY) 5 MG tablet Take 5 mg by mouth daily.  Marland Kitchen aspirin EC 81 MG tablet Take 81 mg by mouth daily.   Marland Kitchen atorvastatin (LIPITOR) 40 MG tablet Take 40 mg by mouth at bedtime.   . bisacodyl (DULCOLAX) 10 MG suppository If not relieved by MOM, give 10 mg Bisacodyl suppositiory rectally X 1 dose in 24 hours as needed (Do not use constipation standing orders for residents with renal failure/CFR less than 30. Contact MD for orders) (Physician Order)  . Calcium Carb-Cholecalciferol (CALCIUM-VITAMIN D3) 600-400 MG-UNIT TABS Take  1 tablet by mouth 2 (two) times daily.  . Cholecalciferol (VITAMIN D) 2000 units tablet Take 2,000 Units by mouth daily.   . divalproex (DEPAKOTE) 250 MG DR tablet Take 250 mg by mouth 2 (two) times daily.  Marland Kitchen donepezil (ARICEPT) 10 MG tablet Take 10 mg by mouth at bedtime.   . furosemide (LASIX) 20 MG tablet Take 20 mg by mouth every Monday, Wednesday, and Friday.   . levothyroxine (SYNTHROID) 200 MCG tablet Take 200 mcg by mouth daily before breakfast.   .  levothyroxine (SYNTHROID) 25 MCG tablet Take 25 mcg by mouth daily before breakfast.  . magnesium hydroxide (MILK OF MAGNESIA) 400 MG/5ML suspension If no BM in 3 days, give 30 cc Milk of Magnesium p.o. x 1 dose in 24 hours as needed (Do not use standing constipation orders for residents with renal failure CFR less than 30. Contact MD for orders) (Physician Order)  . memantine (NAMENDA) 10 MG tablet Take 10 mg by mouth 2 (two) times daily.   . Multiple Vitamin (MULTI-VITAMIN DAILY PO) Take one tablet by mouth once daily   . Nutritional Supplement LIQD Take 120 mLs by mouth daily.  Marland Kitchen PARoxetine (PAXIL) 10 MG tablet Take 15 mg by mouth at bedtime. Take 1-1/2 tablets to = 15 mg qd  . sennosides-docusate sodium (SENOKOT-S) 8.6-50 MG tablet Take 2 tablets by mouth daily.   . Skin Protectants, Misc. (MINERIN) CREA Apply topically to ankles twice a day for dry skin.  . Sodium Phosphates (RA SALINE ENEMA RE) If not relieved by Biscodyl suppository, give disposable Saline Enema rectally X 1 dose/24 hrs as needed (Do not use constipation standing orders for residents with renal failure/CFR less than 30. Contact MD for orders)(Physician Or   No facility-administered encounter medications on file as of 09/17/2019.    Review of Systems  GENERAL: No change in appetite, no fatigue, no weight changes, no fever, chills or weakness MOUTH and THROAT: Denies oral discomfort, gingival pain or bleeding, pain from teeth or hoarseness   RESPIRATORY: no cough, SOB, DOE, wheezing, hemoptysis CARDIAC: No chest pain, edema or palpitations GI: No abdominal pain, diarrhea, constipation, heart burn, nausea or vomiting NEUROLOGICAL: Denies dizziness, syncope, numbness, or headache PSYCHIATRIC: Denies feelings of depression or anxiety. No report of hallucinations, insomnia, paranoia, or agitation   Immunization History  Administered Date(s) Administered  . Influenza-Unspecified 02/17/2016, 04/15/2017, 08/01/2018, 03/18/2019    . Moderna SARS-COVID-2 Vaccination 07/06/2019, 08/03/2019  . PPD Test 04/08/2016  . Pneumococcal Conjugate-13 01/11/2017  . Pneumococcal-Unspecified 02/16/2014  . Tdap 02/27/2019   Pertinent  Health Maintenance Due  Topic Date Due  . OPHTHALMOLOGY EXAM  Never done  . HEMOGLOBIN A1C  11/03/2019  . INFLUENZA VACCINE  01/17/2020  . FOOT EXAM  04/16/2020  . PNA vac Low Risk Adult  Completed  . URINE MICROALBUMIN  Discontinued  . DEXA SCAN  Discontinued   Fall Risk  01/16/2019 01/14/2018 01/10/2017 08/07/2016 04/20/2016  Falls in the past year? 1 No No Yes Yes  Number falls in past yr: 1 - - 2 or more 2 or more  Injury with Fall? 0 - - - Yes  Risk Factor Category  - - - - High Fall Risk  Risk for fall due to : History of fall(s);Impaired balance/gait;Impaired mobility - - - -  Follow up Falls evaluation completed;Falls prevention discussed;Education provided - - - -     Vitals:   09/17/19 1108  BP: 138/74  Pulse: 88  Resp: 18  Temp: (!)  97.3 F (36.3 C)  TempSrc: Oral  Weight: 192 lb (87.1 kg)  Height: 5\' 9"  (1.753 m)   Body mass index is 28.35 kg/m.  Physical Exam  GENERAL APPEARANCE: Well nourished. In no acute distress.  SKIN:  Skin is warm and dry.  MOUTH and THROAT: Lips are without lesions. Oral mucosa is moist and without lesions. Tongue is normal in shape, size, and color and without lesions RESPIRATORY: Breathing is even & unlabored, BS CTAB CARDIAC: RRR, no murmur,no extra heart sounds, BLE trace edema GI: Abdomen soft, normal BS, no masses, no tenderness NEUROLOGICAL: There is no tremor. Speech is clear. Alert to self, disoriented to time and place. PSYCHIATRIC:  Affect and behavior are appropriate  Labs reviewed: Recent Labs    11/01/18 0000  NA 143  K 4.1  BUN 16  CREATININE 0.9    Recent Labs    11/01/18 0000 05/06/19 0000  WBC 4.8 3.4  NEUTROABS 1  --   HGB 12.9 12.5  HCT 40 37  PLT 162 126*   Lab Results  Component Value Date   TSH 9.89  (A) 07/29/2019   Lab Results  Component Value Date   HGBA1C 6.1 05/06/2019   Lab Results  Component Value Date   CHOL 167 08/22/2018   HDL 49 08/22/2018   LDLCALC 106 08/22/2018   TRIG 58 08/22/2018    Assessment/Plan  1. Secondary DM with CKD stage 3 and hypertension (HCC) Lab Results  Component Value Date   HGBA1C 6.1 05/06/2019   - diet-controlled, for opthalmology consult  2. Hypothyroidism due to acquired atrophy of thyroid Lab Results  Component Value Date   TSH 9.89 (A) 07/29/2019   - continue Levothyroxine  3. Mixed hyperlipidemia Lab Results  Component Value Date   CHOL 167 08/22/2018   HDL 49 08/22/2018   LDLCALC 106 08/22/2018   TRIG 58 08/22/2018   - continue Lipitor  4. Lower extremity edema - continue Lasix  5. Major depression, recurrent, chronic (HCC) - stable, continue Paroxetine  6. Mood disorder with psychosis (HCC) - has occasional agitation, continue Divalproex and Aripiprazole - followed by psych NP   Family/ staff Communication:  Discussed plan of care with resident and charge nurse.  Labs/tests ordered:  None  Goals of care:   Long-term care  10/22/2018, DNP, FNP-BC [facility] [phone] (Monday-Friday 8:00 a.m. - 5:00 p.m.) [phone] (after hours)

## 2019-09-23 ENCOUNTER — Non-Acute Institutional Stay (SKILLED_NURSING_FACILITY): Payer: Medicare Other | Admitting: Adult Health

## 2019-09-23 ENCOUNTER — Encounter: Payer: Self-pay | Admitting: Adult Health

## 2019-09-23 DIAGNOSIS — R600 Localized edema: Secondary | ICD-10-CM | POA: Diagnosis not present

## 2019-09-23 DIAGNOSIS — F0281 Dementia in other diseases classified elsewhere with behavioral disturbance: Secondary | ICD-10-CM

## 2019-09-23 DIAGNOSIS — E034 Atrophy of thyroid (acquired): Secondary | ICD-10-CM | POA: Diagnosis not present

## 2019-09-23 DIAGNOSIS — E782 Mixed hyperlipidemia: Secondary | ICD-10-CM

## 2019-09-23 DIAGNOSIS — G308 Other Alzheimer's disease: Secondary | ICD-10-CM

## 2019-09-23 DIAGNOSIS — F339 Major depressive disorder, recurrent, unspecified: Secondary | ICD-10-CM | POA: Diagnosis not present

## 2019-09-23 DIAGNOSIS — F39 Unspecified mood [affective] disorder: Secondary | ICD-10-CM

## 2019-09-23 DIAGNOSIS — Z7189 Other specified counseling: Secondary | ICD-10-CM

## 2019-09-23 NOTE — Progress Notes (Signed)
Location:  [place] Room Number: 214-B Place of Service:  SNF (31) Provider:  Durenda Age, DNP, FNP-BC  Patient Care Team: Hendricks Limes, MD as PCP - General (Internal Medicine)  Extended Emergency Contact Information Primary Emergency Contact: Robinson,Roberto Address: [address]          [place], [place] [address] [name] of [place] Phone: [phone] Relation: Son Secondary Emergency Contact: Reid,Cynthia  [place] Mobile Phone: [phone] Relation: Daughter  Code Status:  Full Code  Goals of care: Advanced Directive information Advanced Directives 07/28/2019  Does Patient Have a Medical Advance Directive? Yes  Type of Advance Directive (No Data)  Does patient want to make changes to medical advance directive? No - Patient declined  Copy of [place] in Chart? -  Would patient like information on creating a medical advance directive? -  Pre-existing out of facility DNR order (yellow form or pink MOST form) -     Chief Complaint  Patient presents with  . Advanced Directive    Patient is seen for an advance care plan meeting.      HPI:  Pt is an 85 y.o. female who had advance care planning attended by social worker, NP, activity director, MDS coordinator and daughter Caren Griffins, who attended via telephone conference.  Resident was invited to the meeting but declined. She remains to be full code. She is currently having restorative eating/swallowing and transfers. She was reported to scratched CNA during care. She continues to take Aripiprazole and Depakote for Mood disorder with psychosis. A psych NP follows her up. Daughter mentioned that she is aware that resident has agitation at times when she visits and resident does not want to do window visit. Discussed medications, vital signs and weigths.  She is a long-term care resident of [facility].  She has PMH of  dementia, hypothyroidism and hypertension.  The meeting lasted for 25 minutes.   Past Medical History:  Diagnosis Date  . Chronic kidney disease    Stage III  . Dementia with behavioral disturbance ([place]) 03/19/2015   Notated on FL2 Form from Dr. Agapito Games [phone]  . Depression    previously saw Dr Casimiro Needle  . Diabetes mellitus without complication ([place])    07/21/53 A1c 6%  . Dyslipidemia    11/18/15 Tg 74, HDL 60, LDL 90 on statin  . Hypertension   . Hypothyroidism    11/18/15 TSH 53.6, 02/07/16 TSH 9.42 @ [facility]  . MDD (major depressive disorder)    With behavioral disturbance  . Scabies    02/21/16 [facility],[place]; Rx: Permethrin cream  . Urinary tract infection due to Proteus 05/04/2019   Uniformly sensitive except to nitrofurantoin; Cipro prescribed  . Vitamin D deficiency    11/18/15 vitamin D 25-hydroxy 39   Past Surgical History:  Procedure Laterality Date  . no record     04/10/15 [facility] note: see old chart    No Known Allergies  Outpatient Encounter Medications as of 09/23/2019  Medication Sig  . acetaminophen (TYLENOL) 325 MG tablet Take 650 mg by mouth every 6 (six) hours as needed for mild pain or moderate pain.  . ARIPiprazole (ABILIFY) 5 MG tablet Take 5 mg by mouth daily.  Marland Kitchen aspirin EC 81 MG tablet Take 81 mg by mouth daily.   Marland Kitchen atorvastatin (LIPITOR) 40 MG tablet Take 40 mg by mouth at bedtime.   . bisacodyl (DULCOLAX) 10 MG suppository If not  relieved by MOM, give 10 mg Bisacodyl suppositiory rectally X 1 dose in 24 hours as needed (Do not use constipation standing orders for residents with renal failure/CFR less than 30. Contact MD for orders) (Physician Order)  . Calcium Carb-Cholecalciferol (CALCIUM-VITAMIN D3) 600-400 MG-UNIT TABS Take 1 tablet by mouth 2 (two) times daily.  . Cholecalciferol (VITAMIN D) 2000 units tablet Take 2,000 Units by mouth daily.   . divalproex (DEPAKOTE) 250 MG DR tablet Take 250 mg by mouth 2 (two) times daily.   Marland Kitchen donepezil (ARICEPT) 10 MG tablet Take 10 mg by mouth at bedtime.   . furosemide (LASIX) 20 MG tablet Take 20 mg by mouth every Monday, Wednesday, and Friday.   . levothyroxine (SYNTHROID) 200 MCG tablet Take 200 mcg by mouth daily before breakfast.   . levothyroxine (SYNTHROID) 25 MCG tablet Take 25 mcg by mouth daily before breakfast.  . magnesium hydroxide (MILK OF MAGNESIA) 400 MG/5ML suspension If no BM in 3 days, give 30 cc Milk of Magnesium p.o. x 1 dose in 24 hours as needed (Do not use standing constipation orders for residents with renal failure CFR less than 30. Contact MD for orders) (Physician Order)  . memantine (NAMENDA) 10 MG tablet Take 10 mg by mouth 2 (two) times daily.   . Multiple Vitamin (MULTI-VITAMIN DAILY PO) Take one tablet by mouth once daily   . Nutritional Supplement LIQD Take 120 mLs by mouth daily. MedPass  . PARoxetine (PAXIL) 10 MG tablet Take 15 mg by mouth at bedtime. Take 1-1/2 tablets to = 15 mg qd  . sennosides-docusate sodium (SENOKOT-S) 8.6-50 MG tablet Take 2 tablets by mouth daily.   . Skin Protectants, Misc. (MINERIN) CREA Apply topically to ankles twice a day for dry skin.  . Sodium Phosphates (RA SALINE ENEMA RE) If not relieved by Biscodyl suppository, give disposable Saline Enema rectally X 1 dose/24 hrs as needed (Do not use constipation standing orders for residents with renal failure/CFR less than 30. Contact MD for orders)(Physician Or   No facility-administered encounter medications on file as of 09/23/2019.    Review of Systems  GENERAL: No change in appetite, no fatigue, no weight changes, no fever, chills or weakness MOUTH and THROAT: Denies oral discomfort, gingival pain or bleeding, pain from teeth or hoarseness   RESPIRATORY: no cough, SOB, DOE, wheezing, hemoptysis CARDIAC: No chest pain, edema or palpitations GI: No abdominal pain, diarrhea, constipation, heart burn, nausea or vomiting GU: Denies dysuria, frequency, hematuria or  discharge NEUROLOGICAL: Denies dizziness, syncope, numbness, or headache PSYCHIATRIC: Denies feelings of depression or anxiety. No report of hallucinations, insomnia, paranoia, or agitation   Immunization History  Administered Date(s) Administered  . Influenza-Unspecified 02/17/2016, 04/15/2017, 08/01/2018, 03/18/2019  . Moderna SARS-COVID-2 Vaccination 07/06/2019, 08/03/2019  . PPD Test 04/08/2016  . Pneumococcal Conjugate-13 01/11/2017  . Pneumococcal-Unspecified 02/16/2014  . Tdap 02/27/2019   Pertinent  Health Maintenance Due  Topic Date Due  . OPHTHALMOLOGY EXAM  Never done  . HEMOGLOBIN A1C  11/03/2019  . INFLUENZA VACCINE  01/17/2020  . FOOT EXAM  04/16/2020  . PNA vac Low Risk Adult  Completed  . URINE MICROALBUMIN  Discontinued  . DEXA SCAN  Discontinued   Fall Risk  01/16/2019 01/14/2018 01/10/2017 08/07/2016 04/20/2016  Falls in the past year? 1 No No Yes Yes  Number falls in past yr: 1 - - 2 or more 2 or more  Injury with Fall? 0 - - - Yes  Risk Factor Category  - - - -  High Fall Risk  Risk for fall due to : History of fall(s);Impaired balance/gait;Impaired mobility - - - -  Follow up Falls evaluation completed;Falls prevention discussed;Education provided - - - -     Vitals:   09/23/19 1117  BP: 138/80  Pulse: 70  Resp: 18  Temp: (!) 97.2 F (36.2 C)  TempSrc: Oral  Weight: 195 lb 6.4 oz (88.6 kg)  Height: 5\' 9"  (1.753 m)   Body mass index is 28.86 kg/m.  Physical Exam  GENERAL APPEARANCE: Well nourished. In no acute distress. Normal body habitus SKIN:  Skin is warm and dry.  MOUTH and THROAT: Lips are without lesions. Oral mucosa is moist and without lesions. Tongue is normal in shape, size, and color and without lesions RESPIRATORY: Breathing is even & unlabored, BS CTAB CARDIAC: RRR, no murmur,no extra heart sounds, BLE trace edema GI: Abdomen soft, normal BS, no masses, no tenderness NEUROLOGICAL: There is no tremor. Speech is clear. Alert to self,  disoriented to time and place. PSYCHIATRIC:  Affect and behavior are appropriate  Labs reviewed: Recent Labs    11/01/18 0000  NA 143  K 4.1  BUN 16  CREATININE 0.9    Recent Labs    11/01/18 0000 05/06/19 0000  WBC 4.8 3.4  NEUTROABS 1  --   HGB 12.9 12.5  HCT 40 37  PLT 162 126*   Lab Results  Component Value Date   TSH 9.89 (A) 07/29/2019   Lab Results  Component Value Date   HGBA1C 6.1 05/06/2019   Lab Results  Component Value Date   CHOL 167 08/22/2018   HDL 49 08/22/2018   LDLCALC 106 08/22/2018   TRIG 58 08/22/2018    Assessment/Plan  1. Advance care planning - remains to be full code -Discussed medications, vital signs and weights  2. Hypothyroidism due to acquired atrophy of thyroid Lab Results  Component Value Date   TSH 9.89 (A) 07/29/2019   -Continue levothyroxine 25 mcg and 200 mcg = 225 mcg total daily  3. Lower extremity edema -Continue Lasix 20 mg 1 tab q. MWF  4. Mixed hyperlipidemia Lab Results  Component Value Date   CHOL 167 08/22/2018   HDL 49 08/22/2018   LDLCALC 106 08/22/2018   TRIG 58 08/22/2018   -Continue Lipitor 40 mg 1 tab at bedtime  5. Mood disorder with psychosis (HCC) - has occasional agitation reported -Continue divalproex DR 125 mg 2 capsules= 250 mg twice a day and aripiprazole 5 mg 1 tab daily -Followed up by psych NP  6. Major depression, recurrent, chronic (HCC) -Continue paroxetine 10 mg 1 1/2 tab = 15 mg daily  7. Alzheimer's disease of other onset with behavioral disturbance (HCC) -Continue donepezil 10 mg 1 tab at bedtime and memantine 10 mg 1 tab twice a day     Family/ staff Communication: Discussed plan of care with daughter and IDT.  Labs/tests ordered: None  Goals of care:   Long-term care   10/22/2018, DNP, FNP-BC [facility] [phone] (Monday-Friday 8:00 a.m. - 5:00 p.m.) [phone] (after hours)

## 2019-09-30 DIAGNOSIS — E1140 Type 2 diabetes mellitus with diabetic neuropathy, unspecified: Secondary | ICD-10-CM | POA: Diagnosis not present

## 2019-09-30 DIAGNOSIS — B351 Tinea unguium: Secondary | ICD-10-CM | POA: Diagnosis not present

## 2019-09-30 DIAGNOSIS — M2141 Flat foot [pes planus] (acquired), right foot: Secondary | ICD-10-CM | POA: Diagnosis not present

## 2019-09-30 DIAGNOSIS — Z7984 Long term (current) use of oral hypoglycemic drugs: Secondary | ICD-10-CM | POA: Diagnosis not present

## 2019-10-08 DIAGNOSIS — H04123 Dry eye syndrome of bilateral lacrimal glands: Secondary | ICD-10-CM | POA: Diagnosis not present

## 2019-10-08 DIAGNOSIS — H5213 Myopia, bilateral: Secondary | ICD-10-CM | POA: Diagnosis not present

## 2019-10-08 DIAGNOSIS — E119 Type 2 diabetes mellitus without complications: Secondary | ICD-10-CM | POA: Diagnosis not present

## 2019-10-08 DIAGNOSIS — H25813 Combined forms of age-related cataract, bilateral: Secondary | ICD-10-CM | POA: Diagnosis not present

## 2019-10-14 ENCOUNTER — Non-Acute Institutional Stay (SKILLED_NURSING_FACILITY): Payer: Medicare Other | Admitting: Adult Health

## 2019-10-14 ENCOUNTER — Encounter: Payer: Self-pay | Admitting: Adult Health

## 2019-10-14 DIAGNOSIS — F339 Major depressive disorder, recurrent, unspecified: Secondary | ICD-10-CM

## 2019-10-14 DIAGNOSIS — F39 Unspecified mood [affective] disorder: Secondary | ICD-10-CM | POA: Diagnosis not present

## 2019-10-14 DIAGNOSIS — G308 Other Alzheimer's disease: Secondary | ICD-10-CM

## 2019-10-14 DIAGNOSIS — E034 Atrophy of thyroid (acquired): Secondary | ICD-10-CM

## 2019-10-14 DIAGNOSIS — Z7189 Other specified counseling: Secondary | ICD-10-CM

## 2019-10-14 DIAGNOSIS — F02818 Dementia in other diseases classified elsewhere, unspecified severity, with other behavioral disturbance: Secondary | ICD-10-CM

## 2019-10-14 DIAGNOSIS — E782 Mixed hyperlipidemia: Secondary | ICD-10-CM

## 2019-10-14 DIAGNOSIS — F0281 Dementia in other diseases classified elsewhere with behavioral disturbance: Secondary | ICD-10-CM

## 2019-10-14 NOTE — Progress Notes (Addendum)
Location:  [name] Room Number: 214-B Place of Service:  SNF (31) Provider:  Durenda Age, DNP, FNP-BC  Patient Care Team: [place] Limes, MD as PCP - General (Internal Medicine)  Extended Emergency Contact Information Primary Emergency Contact: [name],Kelly Address: [address]          [place], [name] [address] [name] Phone: [phone] Relation: Son Secondary Emergency Contact: Reid,Cynthia  [place] Mobile Phone: [phone] Relation: Daughter  Code Status:  Full Code  Goals of care: Advanced Directive information Advanced Directives 07/28/2019  Does Patient Have a Medical Advance Directive? Yes  Type of Advance Directive (No Data)  Does patient want to make changes to medical advance directive? No - Patient declined  Copy of [place] in Chart? -  Would patient like information on creating a medical advance directive? -  Pre-existing out of facility DNR order (yellow form or pink MOST form) -     Chief Complaint  Patient presents with  . Advanced Directive    Patient is seen for a care plan meeting.     HPI:  Pt is an 85 y.o. female who had an advance care planning today attended by MDS coordinator, social worker and NP. Resident was invited to the meeting bu declined. Son, Kelly [name], was called but did not answer 2X. She remains to be full code. Discussed medications, weights and vital signs. Currently, she participated in restorative  Programs - transfers and eating/swallowing. Last fall incident was on February 2021. No injury. Recent BIMS score is 9/15, moderate cognitive deficits. She is a long-term care resident of [facility]. She has a PMH of dementia, hypothyroidism and hypertension.  The meeting lasted for 25 minutes.   Past Medical History:  Diagnosis Date  . Chronic kidney disease    Stage III  . Dementia with behavioral  disturbance ([place]) 03/19/2015   Notated on FL2 Form from Dr. Agapito Games [phone]  . Depression    previously saw Dr Casimiro Needle  . Diabetes mellitus without complication ([place])    0/2/77 A1c 6%  . Dyslipidemia    11/18/15 Tg 74, HDL 60, LDL 90 on statin  . Hypertension   . Hypothyroidism    11/18/15 TSH 53.6, 02/07/16 TSH 9.42 @ [facility]  . MDD (major depressive disorder)    With behavioral disturbance  . Scabies    02/21/16 [facility],[place]; Rx: Permethrin cream  . Urinary tract infection due to Proteus 05/04/2019   Uniformly sensitive except to nitrofurantoin; Cipro prescribed  . Vitamin D deficiency    11/18/15 vitamin D 25-hydroxy 39   Past Surgical History:  Procedure Laterality Date  . no record     04/10/15 [facility] note: see old chart    No Known Allergies  Outpatient Encounter Medications as of 10/14/2019  Medication Sig  . acetaminophen (TYLENOL) 325 MG tablet Take 650 mg by mouth every 6 (six) hours as needed for mild pain or moderate pain.  . ARIPiprazole (ABILIFY) 5 MG tablet Take 5 mg by mouth daily.  Marland Kitchen aspirin EC 81 MG tablet Take 81 mg by mouth daily.   Marland Kitchen atorvastatin (LIPITOR) 40 MG tablet Take 40 mg by mouth at bedtime.   . bisacodyl (DULCOLAX) 10 MG suppository If not relieved by MOM, give 10 mg Bisacodyl suppositiory rectally X 1 dose in 24 hours as needed (Do not use constipation standing orders for residents with renal failure/CFR less than 30.  Contact MD for orders) (Physician Order)  . Calcium Carb-Cholecalciferol (CALCIUM-VITAMIN D3) 600-400 MG-UNIT TABS Take 1 tablet by mouth 2 (two) times daily.  . Cholecalciferol (VITAMIN D) 2000 units tablet Take 2,000 Units by mouth daily.   . divalproex (DEPAKOTE) 250 MG DR tablet Take 250 mg by mouth 2 (two) times daily.  Marland Kitchen donepezil (ARICEPT) 10 MG tablet Take 10 mg by mouth at bedtime.   . furosemide (LASIX) 20 MG tablet Take 20 mg by mouth every Monday, Wednesday, and Friday.   . levothyroxine  (SYNTHROID) 200 MCG tablet Take 200 mcg by mouth daily before breakfast.   . levothyroxine (SYNTHROID) 25 MCG tablet Take 25 mcg by mouth daily before breakfast.  . magnesium hydroxide (MILK OF MAGNESIA) 400 MG/5ML suspension If no BM in 3 days, give 30 cc Milk of Magnesium p.o. x 1 dose in 24 hours as needed (Do not use standing constipation orders for residents with renal failure CFR less than 30. Contact MD for orders) (Physician Order)  . memantine (NAMENDA) 10 MG tablet Take 10 mg by mouth 2 (two) times daily.   . Multiple Vitamin (MULTI-VITAMIN DAILY PO) Take one tablet by mouth once daily   . Nutritional Supplement LIQD Take 120 mLs by mouth daily. MedPass  . PARoxetine (PAXIL) 10 MG tablet Take 15 mg by mouth at bedtime. Take 1-1/2 tablets to = 15 mg qd  . sennosides-docusate sodium (SENOKOT-S) 8.6-50 MG tablet Take 2 tablets by mouth daily.   . Skin Protectants, Misc. (MINERIN) CREA Apply topically to ankles twice a day for dry skin.  . Sodium Phosphates (RA SALINE ENEMA RE) If not relieved by Biscodyl suppository, give disposable Saline Enema rectally X 1 dose/24 hrs as needed (Do not use constipation standing orders for residents with renal failure/CFR less than 30. Contact MD for orders)(Physician Or   No facility-administered encounter medications on file as of 10/14/2019.    Review of Systems  GENERAL: No change in appetite, no fatigue, no weight changes, no fever, chills or weakness MOUTH and THROAT: Denies oral discomfort, gingival pain or bleeding, pain from teeth or hoarseness   RESPIRATORY: no cough, SOB, DOE, wheezing, hemoptysis CARDIAC: No chest pain, edema or palpitations GI: No abdominal pain, diarrhea, constipation, heart burn, nausea or vomiting GU: Denies dysuria, frequency, hematuria or discharge NEUROLOGICAL: Denies dizziness, syncope, numbness, or headache PSYCHIATRIC: Denies feelings of depression or anxiety. No report of hallucinations, insomnia, paranoia, or  agitation   Immunization History  Administered Date(s) Administered  . Influenza-Unspecified 02/17/2016, 04/15/2017, 08/01/2018, 03/18/2019  . Moderna SARS-COVID-2 Vaccination 07/06/2019, 08/03/2019  . PPD Test 04/08/2016  . Pneumococcal Conjugate-13 01/11/2017  . Pneumococcal-Unspecified 02/16/2014  . Tdap 02/27/2019   Pertinent  Health Maintenance Due  Topic Date Due  . OPHTHALMOLOGY EXAM  Never done  . HEMOGLOBIN A1C  11/03/2019  . INFLUENZA VACCINE  01/17/2020  . FOOT EXAM  04/16/2020  . PNA vac Low Risk Adult  Completed  . URINE MICROALBUMIN  Discontinued  . DEXA SCAN  Discontinued   Fall Risk  01/16/2019 01/14/2018 01/10/2017 08/07/2016 04/20/2016  Falls in the past year? 1 No No Yes Yes  Number falls in past yr: 1 - - 2 or more 2 or more  Injury with Fall? 0 - - - Yes  Risk Factor Category  - - - - High Fall Risk  Risk for fall due to : History of fall(s);Impaired balance/gait;Impaired mobility - - - -  Follow up Falls evaluation completed;Falls prevention discussed;Education provided - - - -  Vitals:   10/14/19 1154  BP: 134/76  Pulse: 80  Resp: 20  Temp: 97.8 F (36.6 C)  TempSrc: Oral  Weight: 195 lb 6.4 oz (88.6 kg)  Height: 5\' 9"  (1.753 m)   Body mass index is 28.86 kg/m.  Physical Exam  GENERAL APPEARANCE: Well nourished. In no acute distress. Normal body habitus SKIN:  Skin is warm and dry.  MOUTH and THROAT: Lips are without lesions. Oral mucosa is moist and without lesions. Tongue is normal in shape, size, and color and without lesions RESPIRATORY: Breathing is even & unlabored, BS CTAB CARDIAC: RRR, no murmur,no extra heart sounds,BLE trace edema GI: Abdomen soft, normal BS, no masses, no tenderness EXTREMITIES:  Able to move X 4 extremities NEUROLOGICAL: There is no tremor. Speech is clear. Alert to self, disoriented to time and place. PSYCHIATRIC:  Affect and behavior are appropriate  Labs reviewed: Recent Labs    11/01/18 0000  NA 143    K 4.1  BUN 16  CREATININE 0.9    Recent Labs    11/01/18 0000 05/06/19 0000  WBC 4.8 3.4  NEUTROABS 1  --   HGB 12.9 12.5  HCT 40 37  PLT 162 126*   Lab Results  Component Value Date   TSH 9.89 (A) 07/29/2019   Lab Results  Component Value Date   HGBA1C 6.1 05/06/2019   Lab Results  Component Value Date   CHOL 167 08/22/2018   HDL 49 08/22/2018   LDLCALC 106 08/22/2018   TRIG 58 08/22/2018    Assessment/Plan  1. Advance care planning -Remains to be full code -Discussed medications, vital signs and weights  2. Hypothyroidism due to acquired atrophy of thyroid Lab Results  Component Value Date   TSH 9.89 (A) 07/29/2019   -Continue levothyroxine 225 mcg daily  3. Mixed hyperlipidemia -Continue Lipitor 40 mg 1 tab at bedtime -Check lipid panel  4. Major depression, recurrent, chronic (HCC) -Continue paroxetine 10 mg 1 1/2 tab = 15 mg daily - Followed by psych NP  5.  Mood disorder with psychosis -Stop reports with episodes of agitation, gets combative during care -Continue divalproex DR 125 mg 2 capsules = 250 mg twice a day -Check Depakote level  6.  Alzheimer's disease with behavioral disturbance -Continue memantine and donepezil -Continue supportive care   Family/ staff Communication: Discussed plan of care with IDT.   Labs/tests ordered: CBC, CMP, lipid panel, Depakote level and vitamin D level   Goals of care:   Long-term care  09/26/2019, DNP, FNP-BC [facility] [phone] (Monday-Friday 8:00 a.m. - 5:00 p.m.) [phone] (after hours)

## 2019-10-19 ENCOUNTER — Encounter: Payer: Self-pay | Admitting: Adult Health

## 2019-10-19 ENCOUNTER — Non-Acute Institutional Stay (SKILLED_NURSING_FACILITY): Payer: Medicare Other | Admitting: Adult Health

## 2019-10-19 DIAGNOSIS — E034 Atrophy of thyroid (acquired): Secondary | ICD-10-CM

## 2019-10-19 DIAGNOSIS — E782 Mixed hyperlipidemia: Secondary | ICD-10-CM

## 2019-10-19 DIAGNOSIS — G308 Other Alzheimer's disease: Secondary | ICD-10-CM

## 2019-10-19 DIAGNOSIS — M6281 Muscle weakness (generalized): Secondary | ICD-10-CM | POA: Diagnosis not present

## 2019-10-19 DIAGNOSIS — F339 Major depressive disorder, recurrent, unspecified: Secondary | ICD-10-CM | POA: Diagnosis not present

## 2019-10-19 DIAGNOSIS — R600 Localized edema: Secondary | ICD-10-CM

## 2019-10-19 DIAGNOSIS — E039 Hypothyroidism, unspecified: Secondary | ICD-10-CM | POA: Diagnosis not present

## 2019-10-19 DIAGNOSIS — F0281 Dementia in other diseases classified elsewhere with behavioral disturbance: Secondary | ICD-10-CM | POA: Diagnosis not present

## 2019-10-19 DIAGNOSIS — E785 Hyperlipidemia, unspecified: Secondary | ICD-10-CM | POA: Diagnosis not present

## 2019-10-19 DIAGNOSIS — F02818 Dementia in other diseases classified elsewhere, unspecified severity, with other behavioral disturbance: Secondary | ICD-10-CM

## 2019-10-19 DIAGNOSIS — N905 Atrophy of vulva: Secondary | ICD-10-CM | POA: Diagnosis not present

## 2019-10-19 DIAGNOSIS — F39 Unspecified mood [affective] disorder: Secondary | ICD-10-CM | POA: Diagnosis not present

## 2019-10-19 DIAGNOSIS — D649 Anemia, unspecified: Secondary | ICD-10-CM | POA: Diagnosis not present

## 2019-10-19 DIAGNOSIS — D519 Vitamin B12 deficiency anemia, unspecified: Secondary | ICD-10-CM | POA: Diagnosis not present

## 2019-10-19 LAB — CBC AND DIFFERENTIAL
HCT: 37 (ref 36–46)
Hemoglobin: 12.3 (ref 12.0–16.0)
Neutrophils Absolute: 1
Platelets: 112 — AB (ref 150–399)
WBC: 3.6

## 2019-10-19 LAB — CBC: RBC: 3.89 (ref 3.87–5.11)

## 2019-10-19 NOTE — Progress Notes (Signed)
Location:  [facility] Room Number: 214-B Place of Service:  SNF (31) Provider:  Kenard Gower, DNP, FNP-BC  Patient Care Team: Pecola Lawless, MD as PCP - General (Internal Medicine)  Extended Emergency Contact Information Primary Emergency Contact: Robinson,Roberto Address: [address]          [place], [place] [address] [place] Home Phone: [phone] Relation: Son Secondary Emergency Contact: Reid,Cynthia  [place] Mobile Phone: [phone] Relation: Daughter  Code Status:  Full Code  Goals of care: Advanced Directive information Advanced Directives 07/28/2019  Does Patient Have a Medical Advance Directive? Yes  Type of Advance Directive (No Data)  Does patient want to make changes to medical advance directive? No - Patient declined  Copy of Healthcare Power of Attorney in Chart? -  Would patient like information on creating a medical advance directive? -  Pre-existing out of facility DNR order (yellow form or pink MOST form) -     Chief Complaint  Patient presents with  . Medical Management of Chronic Issues    Routine [facility] visit    HPI:  Pt is an 85 y.o. female seen today for medical management of chronic disease. She is a long-term care resident of [facility]. She has a PMH of dementia, hypothyroidism and hypertension. She was seen in the room today. Staff reported having episodes of agitation during care such as scratching staff. She takes Divalproex and Abilify for mood disorder with psychosis and Paroxetine fo depression.   Past Medical History:  Diagnosis Date  . Chronic kidney disease    Stage III  . Dementia with behavioral disturbance (HCC) 03/19/2015   Notated on FL2 Form from Dr. Dreama Saa [phone]  . Depression    previously saw Dr Donell Beers  . Diabetes mellitus without complication (HCC)    11/18/15 A1c 6%  . Dyslipidemia    11/18/15 Tg 74, HDL  60, LDL 90 on statin  . Hypertension   . Hypothyroidism    11/18/15 TSH 53.6, 02/07/16 TSH 9.42 @ [facility]  . MDD (major depressive disorder)    With behavioral disturbance  . Scabies    02/21/16 [facility],[place]; Rx: Permethrin cream  . Urinary tract infection due to Proteus 05/04/2019   Uniformly sensitive except to nitrofurantoin; Cipro prescribed  . Vitamin D deficiency    11/18/15 vitamin D 25-hydroxy 39   Past Surgical History:  Procedure Laterality Date  . no record     04/10/15 [facility] note: see old chart    No Known Allergies  Outpatient Encounter Medications as of 10/19/2019  Medication Sig  . acetaminophen (TYLENOL) 325 MG tablet Take 650 mg by mouth every 6 (six) hours as needed for mild pain or moderate pain.  . ARIPiprazole (ABILIFY) 5 MG tablet Take 5 mg by mouth daily.  Marland Kitchen aspirin EC 81 MG tablet Take 81 mg by mouth daily.   Marland Kitchen atorvastatin (LIPITOR) 40 MG tablet Take 40 mg by mouth at bedtime.   . bisacodyl (DULCOLAX) 10 MG suppository If not relieved by MOM, give 10 mg Bisacodyl suppositiory rectally X 1 dose in 24 hours as needed (Do not use constipation standing orders for residents with renal failure/CFR less than 30. Contact MD for orders) (Physician Order)  . Calcium Carb-Cholecalciferol (CALCIUM-VITAMIN D3) 600-400 MG-UNIT TABS Take 1 tablet by mouth 2 (two) times daily.  . Cholecalciferol (VITAMIN D) 2000 units tablet Take 2,000 Units by mouth daily.   Marland Kitchen  divalproex (DEPAKOTE) 250 MG DR tablet Take 250 mg by mouth 2 (two) times daily.  Marland Kitchen donepezil (ARICEPT) 10 MG tablet Take 10 mg by mouth at bedtime.   . furosemide (LASIX) 20 MG tablet Take 20 mg by mouth every Monday, Wednesday, and Friday.   . levothyroxine (SYNTHROID) 200 MCG tablet Take 200 mcg by mouth daily before breakfast.   . levothyroxine (SYNTHROID) 25 MCG tablet Take 25 mcg by mouth daily before breakfast.  . magnesium hydroxide (MILK OF MAGNESIA) 400 MG/5ML suspension If no BM in 3 days,  give 30 cc Milk of Magnesium p.o. x 1 dose in 24 hours as needed (Do not use standing constipation orders for residents with renal failure CFR less than 30. Contact MD for orders) (Physician Order)  . memantine (NAMENDA) 10 MG tablet Take 10 mg by mouth 2 (two) times daily.   . Multiple Vitamin (MULTI-VITAMIN DAILY PO) Take one tablet by mouth once daily   . Nutritional Supplement LIQD Take 120 mLs by mouth daily. MedPass  . PARoxetine (PAXIL) 10 MG tablet Take 15 mg by mouth at bedtime. Take 1-1/2 tablets to = 15 mg qd  . sennosides-docusate sodium (SENOKOT-S) 8.6-50 MG tablet Take 2 tablets by mouth daily.   . Skin Protectants, Misc. (MINERIN) CREA Apply topically to ankles twice a day for dry skin.  . Sodium Phosphates (RA SALINE ENEMA RE) If not relieved by Biscodyl suppository, give disposable Saline Enema rectally X 1 dose/24 hrs as needed (Do not use constipation standing orders for residents with renal failure/CFR less than 30. Contact MD for orders)(Physician Or   No facility-administered encounter medications on file as of 10/19/2019.    Review of Systems  GENERAL: No change in appetite, no fatigue, no weight changes, no fever, chills or weakness MOUTH and THROAT: Denies oral discomfort, gingival pain or bleeding, pain from teeth or hoarseness   RESPIRATORY: no cough, SOB, DOE, wheezing, hemoptysis CARDIAC: No chest pain or palpitations GI: No abdominal pain, diarrhea, constipation, heart burn, nausea or vomiting GU: Denies dysuria, frequency, hematuria or discharge NEUROLOGICAL: Denies dizziness, syncope, numbness, or headache PSYCHIATRIC: +agitation   Immunization History  Administered Date(s) Administered  . Influenza-Unspecified 02/17/2016, 04/15/2017, 08/01/2018, 03/18/2019  . Moderna SARS-COVID-2 Vaccination 07/06/2019, 08/03/2019  . PPD Test 04/08/2016  . Pneumococcal Conjugate-13 01/11/2017  . Pneumococcal-Unspecified 02/16/2014  . Tdap 02/27/2019   Pertinent  Health  Maintenance Due  Topic Date Due  . OPHTHALMOLOGY EXAM  10/19/2019 (Originally 10/12/1944)  . HEMOGLOBIN A1C  11/03/2019  . INFLUENZA VACCINE  01/17/2020  . FOOT EXAM  04/16/2020  . PNA vac Low Risk Adult  Completed  . URINE MICROALBUMIN  Discontinued  . DEXA SCAN  Discontinued   Fall Risk  01/16/2019 01/14/2018 01/10/2017 08/07/2016 04/20/2016  Falls in the past year? 1 No No Yes Yes  Number falls in past yr: 1 - - 2 or more 2 or more  Injury with Fall? 0 - - - Yes  Risk Factor Category  - - - - High Fall Risk  Risk for fall due to : History of fall(s);Impaired balance/gait;Impaired mobility - - - -  Follow up Falls evaluation completed;Falls prevention discussed;Education provided - - - -     Vitals:   10/19/19 1433  BP: 134/70  Pulse: 78  Resp: 20  Temp: 97.7 F (36.5 C)  TempSrc: Oral  Weight: 195 lb 6.4 oz (88.6 kg)  Height: 5\' 9"  (1.753 m)   Body mass index is 28.86 kg/m.  Physical  Exam  GENERAL APPEARANCE: Well nourished. In no acute distress. Normal body habitus SKIN:  Skin is warm and dry.  MOUTH and THROAT: Lips are without lesions. Oral mucosa is moist and without lesions.  RESPIRATORY: Breathing is even & unlabored, BS CTAB CARDIAC: RRR, no murmur,no extra heart sounds, trace bilateral feet edema GI: Abdomen soft, normal BS, no masses, no tenderness EXTREMITIES:  Able to move X 4 extremities NEUROLOGICAL: There is no tremor. Speech is clear. Alert to self, disoriented to time and place. PSYCHIATRIC:  Affect and behavior are appropriate  Labs reviewed: Recent Labs    11/01/18 0000  NA 143  K 4.1  BUN 16  CREATININE 0.9    Recent Labs    11/01/18 0000 05/06/19 0000 10/19/19 0000  WBC 4.8 3.4 3.6  NEUTROABS 1  --  1  HGB 12.9 12.5 12.3  HCT 40 37 37  PLT 162 126* 112*   Lab Results  Component Value Date   TSH 9.89 (A) 07/29/2019   Lab Results  Component Value Date   HGBA1C 6.1 05/06/2019   Lab Results  Component Value Date   CHOL 167  08/22/2018   HDL 49 08/22/2018   LDLCALC 106 08/22/2018   TRIG 58 08/22/2018    Assessment/Plan  1. Mixed hyperlipidemia Lab Results  Component Value Date   CHOL 167 08/22/2018   HDL 49 08/22/2018   LDLCALC 106 08/22/2018   TRIG 58 08/22/2018   -Continue Lipitor  2. Hypothyroidism due to acquired atrophy of thyroid Lab Results  Component Value Date   TSH 9.89 (A) 07/29/2019   -Continue levothyroxine  3. Major depression, recurrent, chronic (HCC) -Continue paroxetine   4. Mood disorder with psychosis (HCC) - staff reported resident to get combative during care, continuing divalproex and Abilify -Followed by psych NP  5. Lower extremity edema -Continue Lasix  6. Alzheimer's disease of other onset with behavioral disturbance (HCC) -Continue memantine and donepezil -Continue supportive care and fall precautions    Family/ staff Communication:  Discussed plan of care with charge nurse.  Labs/tests ordered: None  Goals of care:   Long-term care   Kenard Gower, DNP, FNP-BC [facility] [phone] (Monday-Friday 8:00 a.m. - 5:00 p.m.) [phone] (after hours)

## 2019-10-30 DIAGNOSIS — R1311 Dysphagia, oral phase: Secondary | ICD-10-CM | POA: Diagnosis not present

## 2019-10-30 DIAGNOSIS — F0280 Dementia in other diseases classified elsewhere without behavioral disturbance: Secondary | ICD-10-CM | POA: Diagnosis not present

## 2019-11-02 DIAGNOSIS — F0280 Dementia in other diseases classified elsewhere without behavioral disturbance: Secondary | ICD-10-CM | POA: Diagnosis not present

## 2019-11-02 DIAGNOSIS — R1311 Dysphagia, oral phase: Secondary | ICD-10-CM | POA: Diagnosis not present

## 2019-11-03 DIAGNOSIS — R1311 Dysphagia, oral phase: Secondary | ICD-10-CM | POA: Diagnosis not present

## 2019-11-03 DIAGNOSIS — F0280 Dementia in other diseases classified elsewhere without behavioral disturbance: Secondary | ICD-10-CM | POA: Diagnosis not present

## 2019-11-04 DIAGNOSIS — F0280 Dementia in other diseases classified elsewhere without behavioral disturbance: Secondary | ICD-10-CM | POA: Diagnosis not present

## 2019-11-04 DIAGNOSIS — R1311 Dysphagia, oral phase: Secondary | ICD-10-CM | POA: Diagnosis not present

## 2019-11-05 DIAGNOSIS — R1311 Dysphagia, oral phase: Secondary | ICD-10-CM | POA: Diagnosis not present

## 2019-11-05 DIAGNOSIS — F0280 Dementia in other diseases classified elsewhere without behavioral disturbance: Secondary | ICD-10-CM | POA: Diagnosis not present

## 2019-11-06 DIAGNOSIS — R1311 Dysphagia, oral phase: Secondary | ICD-10-CM | POA: Diagnosis not present

## 2019-11-06 DIAGNOSIS — F0280 Dementia in other diseases classified elsewhere without behavioral disturbance: Secondary | ICD-10-CM | POA: Diagnosis not present

## 2019-11-09 DIAGNOSIS — F0280 Dementia in other diseases classified elsewhere without behavioral disturbance: Secondary | ICD-10-CM | POA: Diagnosis not present

## 2019-11-09 DIAGNOSIS — R1311 Dysphagia, oral phase: Secondary | ICD-10-CM | POA: Diagnosis not present

## 2019-11-10 DIAGNOSIS — F0280 Dementia in other diseases classified elsewhere without behavioral disturbance: Secondary | ICD-10-CM | POA: Diagnosis not present

## 2019-11-10 DIAGNOSIS — R1311 Dysphagia, oral phase: Secondary | ICD-10-CM | POA: Diagnosis not present

## 2019-11-11 DIAGNOSIS — F0280 Dementia in other diseases classified elsewhere without behavioral disturbance: Secondary | ICD-10-CM | POA: Diagnosis not present

## 2019-11-11 DIAGNOSIS — R1311 Dysphagia, oral phase: Secondary | ICD-10-CM | POA: Diagnosis not present

## 2019-11-12 DIAGNOSIS — F0280 Dementia in other diseases classified elsewhere without behavioral disturbance: Secondary | ICD-10-CM | POA: Diagnosis not present

## 2019-11-12 DIAGNOSIS — R1311 Dysphagia, oral phase: Secondary | ICD-10-CM | POA: Diagnosis not present

## 2019-11-13 DIAGNOSIS — F0280 Dementia in other diseases classified elsewhere without behavioral disturbance: Secondary | ICD-10-CM | POA: Diagnosis not present

## 2019-11-13 DIAGNOSIS — R1311 Dysphagia, oral phase: Secondary | ICD-10-CM | POA: Diagnosis not present

## 2019-11-16 DIAGNOSIS — F0280 Dementia in other diseases classified elsewhere without behavioral disturbance: Secondary | ICD-10-CM | POA: Diagnosis not present

## 2019-11-16 DIAGNOSIS — R1311 Dysphagia, oral phase: Secondary | ICD-10-CM | POA: Diagnosis not present

## 2019-11-18 DIAGNOSIS — F0390 Unspecified dementia without behavioral disturbance: Secondary | ICD-10-CM | POA: Diagnosis not present

## 2019-11-18 DIAGNOSIS — F22 Delusional disorders: Secondary | ICD-10-CM | POA: Diagnosis not present

## 2019-11-18 DIAGNOSIS — F329 Major depressive disorder, single episode, unspecified: Secondary | ICD-10-CM | POA: Diagnosis not present

## 2019-11-18 DIAGNOSIS — F39 Unspecified mood [affective] disorder: Secondary | ICD-10-CM | POA: Diagnosis not present

## 2019-12-01 ENCOUNTER — Encounter: Payer: Self-pay | Admitting: Internal Medicine

## 2019-12-01 ENCOUNTER — Non-Acute Institutional Stay (SKILLED_NURSING_FACILITY): Payer: Medicare Other | Admitting: Internal Medicine

## 2019-12-01 DIAGNOSIS — I10 Essential (primary) hypertension: Secondary | ICD-10-CM | POA: Diagnosis not present

## 2019-12-01 DIAGNOSIS — E034 Atrophy of thyroid (acquired): Secondary | ICD-10-CM

## 2019-12-01 DIAGNOSIS — R7303 Prediabetes: Secondary | ICD-10-CM | POA: Diagnosis not present

## 2019-12-01 DIAGNOSIS — E782 Mixed hyperlipidemia: Secondary | ICD-10-CM

## 2019-12-01 NOTE — Progress Notes (Signed)
   NURSING HOME LOCATION:  [facility] ROOM NUMBER:  [id number]  CODE STATUS:  FULL CODE  PCP:  Pecola Lawless, MD  [address] [place] [address]   This is a nursing facility follow up of chronic medical diagnoses.  Interim medical record and care since last [facility] visit was updated with review of diagnostic studies and change in clinical status since last visit were documented.  HPI: She is a permanent resident facility with medical diagnoses of vitamin D deficiency, major depressive reaction, hypothyroidism, essential hypertension, dyslipidemia, diabetes with CKD, and dementia with behavioral disturbance.  Review of systems: Dementia invalidated responses.  She stated that she was worried about her health because she cannot walk.  She had no specific complaints.  Constitutional: No fever, significant weight change, fatigue  Eyes: No redness, discharge, pain, vision change ENT/mouth: No nasal congestion,  purulent discharge, earache, change in hearing, sore throat  Cardiovascular: No chest pain, palpitations, paroxysmal nocturnal dyspnea, claudication, edema  Respiratory: No cough, sputum production, hemoptysis, DOE, significant snoring, apnea   Gastrointestinal: No heartburn, dysphagia, abdominal pain, nausea /vomiting, rectal bleeding, melena, change in bowels Genitourinary: No dysuria, hematuria, pyuria, incontinence, nocturia Musculoskeletal: No joint stiffness, joint swelling,  pain Dermatologic: No rash, pruritus, change in appearance of skin Neurologic: No dizziness, headache, syncope, seizures, numbness, tingling Psychiatric: No  insomnia, anorexia Endocrine: No change in hair/skin/nails, excessive thirst, excessive hunger, excessive urination  Hematologic/lymphatic: No significant bruising, lymphadenopathy, abnormal bleeding Allergy/immunology: No itchy/watery eyes, significant sneezing, urticaria, angioedema  Physical exam:  Pertinent or positive  findings: She was initially asleep despite the fact it was after 2:00 in the afternoon.  Upon awakening she exhibits the alarmed facies she always does with any verbal interaction.  When communicating she tends to look inferolaterally rather than at the interviewer. The left nasolabial fold appears decreased.  She has a grade 1/2 systolic murmur.  Second heart sound is increased.  Pedal pulses are decreased.  Pes planus is present.  She has 1/2+ edema at the sock line.  She is diffusely weak to opposition especially left upper extremity.  General appearance: Adequately nourished; no acute distress, increased work of breathing is present.   Lymphatic: No lymphadenopathy about the head, neck, axilla. Eyes: No conjunctival inflammation or lid edema is present. There is no scleral icterus. Ears:  External ear exam shows no significant lesions or deformities.   Nose:  External nasal examination shows no deformity or inflammation. Nasal mucosa are pink and moist without lesions, exudates Oral exam:  Lips and gums are healthy appearing. There is no oropharyngeal erythema or exudate. Neck:  No thyromegaly, masses, tenderness noted.    Heart:  Normal rate and regular rhythm. S1 normal without gallop,  click, rub .  Lungs: Chest clear to auscultation without wheezes, rhonchi, rales, rubs. Abdomen: Bowel sounds are normal. Abdomen is soft and nontender with no organomegaly, hernias, masses. GU: Deferred  Extremities:  No cyanosis, clubbing  Neurologic exam :Balance, Rhomberg, finger to nose testing could not be completed due to clinical state Skin: Warm & dry w/o tenting. No significant lesions or rash.  See summary under each active problem in the Problem List with associated updated therapeutic plan

## 2019-12-01 NOTE — Assessment & Plan Note (Addendum)
07/29/2019 TSH 9.89; L-thyroxine increased to 225 mcg daily TSH will be updated

## 2019-12-01 NOTE — Assessment & Plan Note (Addendum)
A1c will be monitored as she is on Abilify with increased risk of hyperglycemia and progression to diabetes

## 2019-12-01 NOTE — Patient Instructions (Signed)
See assessment and plan under each diagnosis in the problem list and acutely for this visit 

## 2019-12-01 NOTE — Assessment & Plan Note (Addendum)
Blood pressure controlled without antihypertensive medication 

## 2019-12-01 NOTE — Assessment & Plan Note (Signed)
Because of relatively high dose of atorvastatin; LFTs and lipids need to be monitored

## 2019-12-02 LAB — BASIC METABOLIC PANEL
BUN: 20 (ref 4–21)
CO2: 24 — AB (ref 13–22)
Chloride: 108 (ref 99–108)
Creatinine: 0.9 (ref 0.5–1.1)
Glucose: 65
Potassium: 4.3 (ref 3.4–5.3)
Sodium: 143 (ref 137–147)

## 2019-12-02 LAB — HEPATIC FUNCTION PANEL
ALT: 8 (ref 7–35)
AST: 21 (ref 13–35)
Alkaline Phosphatase: 68 (ref 25–125)
Bilirubin, Total: 0.3

## 2019-12-02 LAB — LIPID PANEL
Cholesterol: 148 (ref 0–200)
HDL: 42 (ref 35–70)
LDL Cholesterol: 83
LDl/HDL Ratio: 3.5
Triglycerides: 114 (ref 40–160)

## 2019-12-02 LAB — COMPREHENSIVE METABOLIC PANEL
Albumin: 3.5 (ref 3.5–5.0)
Calcium: 9.3 (ref 8.7–10.7)
GFR calc Af Amer: 67.77
GFR calc non Af Amer: 58.47
Globulin: 2.4

## 2019-12-02 LAB — HEMOGLOBIN A1C: Hemoglobin A1C: 6.1

## 2019-12-02 LAB — TSH: TSH: 3.39 (ref 0.41–5.90)

## 2019-12-30 DIAGNOSIS — F329 Major depressive disorder, single episode, unspecified: Secondary | ICD-10-CM | POA: Diagnosis not present

## 2019-12-30 DIAGNOSIS — F22 Delusional disorders: Secondary | ICD-10-CM | POA: Diagnosis not present

## 2019-12-30 DIAGNOSIS — F39 Unspecified mood [affective] disorder: Secondary | ICD-10-CM | POA: Diagnosis not present

## 2019-12-30 DIAGNOSIS — F0390 Unspecified dementia without behavioral disturbance: Secondary | ICD-10-CM | POA: Diagnosis not present

## 2020-01-04 ENCOUNTER — Non-Acute Institutional Stay (SKILLED_NURSING_FACILITY): Payer: Medicare Other | Admitting: Adult Health

## 2020-01-04 ENCOUNTER — Encounter: Payer: Self-pay | Admitting: Adult Health

## 2020-01-04 DIAGNOSIS — R600 Localized edema: Secondary | ICD-10-CM | POA: Diagnosis not present

## 2020-01-04 DIAGNOSIS — F39 Unspecified mood [affective] disorder: Secondary | ICD-10-CM | POA: Diagnosis not present

## 2020-01-04 DIAGNOSIS — E034 Atrophy of thyroid (acquired): Secondary | ICD-10-CM

## 2020-01-04 DIAGNOSIS — F0281 Dementia in other diseases classified elsewhere with behavioral disturbance: Secondary | ICD-10-CM | POA: Diagnosis not present

## 2020-01-04 DIAGNOSIS — N182 Chronic kidney disease, stage 2 (mild): Secondary | ICD-10-CM | POA: Diagnosis not present

## 2020-01-04 DIAGNOSIS — G308 Other Alzheimer's disease: Secondary | ICD-10-CM | POA: Diagnosis not present

## 2020-01-04 DIAGNOSIS — E782 Mixed hyperlipidemia: Secondary | ICD-10-CM | POA: Diagnosis not present

## 2020-01-04 DIAGNOSIS — F339 Major depressive disorder, recurrent, unspecified: Secondary | ICD-10-CM | POA: Diagnosis not present

## 2020-01-04 DIAGNOSIS — E1122 Type 2 diabetes mellitus with diabetic chronic kidney disease: Secondary | ICD-10-CM

## 2020-01-04 NOTE — Progress Notes (Signed)
Location:  [facility] Room Number: 214-B Place of Service:  SNF (31) Provider:  Kenard Gower, DNP, FNP-BC  Patient Care Team: Pecola Lawless, MD as PCP - General (Internal Medicine)  Extended Emergency Contact Information Primary Emergency Contact: Robinson,Roberto Address: [address]          [place], [place] [address] [place] Home Phone: [phone] Relation: Son Secondary Emergency Contact: Reid,Cynthia  [place] Mobile Phone: [phone] Relation: Daughter  Code Status:  FULL CODE  Goals of care: Advanced Directive information Advanced Directives 07/28/2019  Does Patient Have a Medical Advance Directive? Yes  Type of Advance Directive (No Data)  Does patient want to make changes to medical advance directive? No - Patient declined  Copy of Healthcare Power of Attorney in Chart? -  Would patient like information on creating a medical advance directive? -  Pre-existing out of facility DNR order (yellow form or pink MOST form) -     Chief Complaint  Patient presents with  . Medical Management of Chronic Issues    Routine [facility] visit    HPI:  Pt is an 85 y.o. female seen today for medical management of chronic diseases.  She is a long-term care resident of [facility].  She has a PMH of dementia, hypothyroidism and hypertension.  She is currently in having restorative transfer with complete sit to stand transfers from wheelchair to parallel bars. Latest hgbA1c 6.1, 12/02/19. She is not taking medication for diabetes. Latest tsh 3.39, 12/02/19.  Currently, she takes levothyroxine 225 mcg daily for hypothyroidism.   Past Medical History:  Diagnosis Date  . Chronic kidney disease    Stage III  . Dementia with behavioral disturbance (HCC) 03/19/2015   Notated on FL2 Form from Dr. Dreama Saa [phone]  . Depression    previously saw Dr Donell Beers  . Diabetes mellitus  without complication (HCC)    11/18/15 A1c 6%  . Dyslipidemia    11/18/15 Tg 74, HDL 60, LDL 90 on statin  . Hypertension   . Hypothyroidism    11/18/15 TSH 53.6, 02/07/16 TSH 9.42 @ [facility]  . MDD (major depressive disorder)    With behavioral disturbance  . Scabies    02/21/16 [facility],[place]; Rx: Permethrin cream  . Urinary tract infection due to Proteus 05/04/2019   Uniformly sensitive except to nitrofurantoin; Cipro prescribed  . Vitamin D deficiency    11/18/15 vitamin D 25-hydroxy 39   Past Surgical History:  Procedure Laterality Date  . no record     04/10/15 [facility] note: see old chart    No Known Allergies  Outpatient Encounter Medications as of 01/04/2020  Medication Sig  . acetaminophen (TYLENOL) 325 MG tablet Take 650 mg by mouth every 6 (six) hours as needed for mild pain or moderate pain.  . ARIPiprazole (ABILIFY) 5 MG tablet Take 7.5 mg by mouth daily. 1.5 tablets to = 7.5 mg  . aspirin EC 81 MG tablet Take 81 mg by mouth daily.   Marland Kitchen atorvastatin (LIPITOR) 40 MG tablet Take 40 mg by mouth at bedtime.   . bisacodyl (DULCOLAX) 10 MG suppository If not relieved by MOM, give 10 mg Bisacodyl suppositiory rectally X 1 dose in 24 hours as needed (Do not use constipation standing orders for residents with renal failure/CFR less than 30. Contact MD for orders) (Physician Order)  . Calcium Carb-Cholecalciferol (CALCIUM-VITAMIN D3) 600-400 MG-UNIT TABS Take 1 tablet by mouth 2 (two)  times daily.  . Cholecalciferol (VITAMIN D) 2000 units tablet Take 2,000 Units by mouth daily.   . divalproex (DEPAKOTE) 250 MG DR tablet Take 250 mg by mouth 2 (two) times daily.  Marland Kitchen donepezil (ARICEPT) 10 MG tablet Take 10 mg by mouth at bedtime.   . furosemide (LASIX) 20 MG tablet Take 20 mg by mouth every Monday, Wednesday, and Friday.   . levothyroxine (SYNTHROID) 200 MCG tablet Take 200 mcg by mouth daily before breakfast. Take with 25 mcg to = 225 mcg  . levothyroxine (SYNTHROID) 25  MCG tablet Take 25 mcg by mouth daily before breakfast. Take with a 200 mcg tab to = 225 mcg  . magnesium hydroxide (MILK OF MAGNESIA) 400 MG/5ML suspension If no BM in 3 days, give 30 cc Milk of Magnesium p.o. x 1 dose in 24 hours as needed (Do not use standing constipation orders for residents with renal failure CFR less than 30. Contact MD for orders) (Physician Order)  . memantine (NAMENDA) 10 MG tablet Take 10 mg by mouth 2 (two) times daily.   . Multiple Vitamin (MULTI-VITAMIN DAILY PO) Take one tablet by mouth once daily   . Nutritional Supplement LIQD Take 120 mLs by mouth daily. MedPass  . PARoxetine (PAXIL) 10 MG tablet Take 15 mg by mouth at bedtime. Take 1-1/2 tablets to = 15 mg qd  . sennosides-docusate sodium (SENOKOT-S) 8.6-50 MG tablet Take 2 tablets by mouth daily.   . Skin Protectants, Misc. (MINERIN) CREA Apply topically to ankles twice a day for dry skin.  . Sodium Phosphates (RA SALINE ENEMA RE) If not relieved by Biscodyl suppository, give disposable Saline Enema rectally X 1 dose/24 hrs as needed (Do not use constipation standing orders for residents with renal failure/CFR less than 30. Contact MD for orders)(Physician Or   No facility-administered encounter medications on file as of 01/04/2020.    Review of Systems  GENERAL: No change in appetite, no fatigue, no weight changes, no fever, chills or weakness MOUTH and THROAT: Denies oral discomfort, gingival pain or bleeding RESPIRATORY: no cough, SOB, DOE, wheezing, hemoptysis CARDIAC: No chest pain or palpitations GI: No abdominal pain, diarrhea, constipation, heart burn, nausea or vomiting GU: Denies dysuria, frequency, hematuria or discharge NEUROLOGICAL: Denies dizziness, syncope, numbness, or headache PSYCHIATRIC: Denies feelings of depression or anxiety. No report of hallucinations, insomnia, paranoia, or agitation   Immunization History  Administered Date(s) Administered  . Influenza-Unspecified 02/17/2016,  04/15/2017, 08/01/2018, 03/18/2019  . Moderna SARS-COVID-2 Vaccination 07/06/2019, 08/03/2019  . PPD Test 04/08/2016  . Pneumococcal Conjugate-13 01/11/2017  . Pneumococcal-Unspecified 02/16/2014  . Tdap 02/27/2019   Pertinent  Health Maintenance Due  Topic Date Due  . OPHTHALMOLOGY EXAM  Never done  . HEMOGLOBIN A1C  11/03/2019  . INFLUENZA VACCINE  01/17/2020  . FOOT EXAM  04/16/2020  . PNA vac Low Risk Adult  Completed  . URINE MICROALBUMIN  Discontinued  . DEXA SCAN  Discontinued   Fall Risk  01/16/2019 01/14/2018 01/10/2017 08/07/2016 04/20/2016  Falls in the past year? 1 No No Yes Yes  Number falls in past yr: 1 - - 2 or more 2 or more  Injury with Fall? 0 - - - Yes  Risk Factor Category  - - - - High Fall Risk  Risk for fall due to : History of fall(s);Impaired balance/gait;Impaired mobility - - - -  Follow up Falls evaluation completed;Falls prevention discussed;Education provided - - - -     Vitals:   01/04/20 1145  BP: 122/74  Pulse: 68  Resp: 18  Temp: (!) 97.5 F (36.4 C)  TempSrc: Oral  Weight: 192 lb 12.8 oz (87.5 kg)  Height: 5\' 9"  (1.753 m)   Body mass index is 28.47 kg/m.  Physical Exam  GENERAL APPEARANCE: Well nourished. In no acute distress.  SKIN:  Skin is warm and dry.  MOUTH and THROAT: Lips are without lesions. Oral mucosa is moist and without lesions. Tongue is normal in shape, size, and color and without lesions RESPIRATORY: Breathing is even & unlabored, BS CTAB CARDIAC: RRR, no murmur,no extra heart sounds, bilateral feet trace edema GI: Abdomen soft, normal BS, no masses, no tenderness EXTREMITIES:  Able to move X 4 extremities NEUROLOGICAL: There is no tremor. Speech is clear. Alert to self, disoriented to time and place. PSYCHIATRIC:  Affect and behavior are appropriate  Labs reviewed:  Recent Labs    05/06/19 0000 10/19/19 0000  WBC 3.4 3.6  NEUTROABS  --  1  HGB 12.5 12.3  HCT 37 37  PLT 126* 112*   Lab Results  Component  Value Date   TSH 9.89 (A) 07/29/2019   Lab Results  Component Value Date   HGBA1C 6.1 05/06/2019   Lab Results  Component Value Date   CHOL 167 08/22/2018   HDL 49 08/22/2018   LDLCALC 106 08/22/2018   TRIG 58 08/22/2018    Assessment/Plan  1. Type 2 diabetes mellitus with stage 2 chronic kidney disease, without long-term current use of insulin (HCC) Lab Results  Component Value Date   HGBA1C 6.1 12/02/2019   - diet-controlled  2. Hypothyroidism due to acquired atrophy of thyroid Lab Results  Component Value Date   TSH 3.39 12/02/2019   -Continue levothyroxine  3. Mixed hyperlipidemia Lab Results  Component Value Date   CHOL 148 12/02/2019   HDL 42 12/02/2019   LDLCALC 83 12/02/2019   TRIG 114 12/02/2019   -Continue Lipitor  4. Lower extremity edema -Stable, continue Lasix  5. Mood disorder with psychosis (HCC) -Mood is stable, continue divalproex  6. Major depression, recurrent, chronic (HCC) -Stable, continue aripiprazole and paroxetine -Followed up by psych NP  7. Alzheimer's disease of other onset with behavioral disturbance (HCC) -Continue memantine and Aricept -Continue supportive care and fall precautions    Family/ staff Communication: Discussed plan of care with resident and charge nurse.  Labs/tests ordered: None  Goals of care:   Long-term care   12/04/2019, DNP, FNP-BC [facility] [phone] (Monday-Friday 8:00 a.m. - 5:00 p.m.) [phone] (after hours)

## 2020-01-12 ENCOUNTER — Encounter: Payer: Self-pay | Admitting: Adult Health

## 2020-01-12 ENCOUNTER — Non-Acute Institutional Stay (SKILLED_NURSING_FACILITY): Payer: Medicare Other | Admitting: Adult Health

## 2020-01-12 DIAGNOSIS — E782 Mixed hyperlipidemia: Secondary | ICD-10-CM | POA: Diagnosis not present

## 2020-01-12 DIAGNOSIS — E034 Atrophy of thyroid (acquired): Secondary | ICD-10-CM

## 2020-01-12 DIAGNOSIS — N182 Chronic kidney disease, stage 2 (mild): Secondary | ICD-10-CM

## 2020-01-12 DIAGNOSIS — R600 Localized edema: Secondary | ICD-10-CM | POA: Diagnosis not present

## 2020-01-12 DIAGNOSIS — F339 Major depressive disorder, recurrent, unspecified: Secondary | ICD-10-CM

## 2020-01-12 DIAGNOSIS — F39 Unspecified mood [affective] disorder: Secondary | ICD-10-CM | POA: Diagnosis not present

## 2020-01-12 DIAGNOSIS — G309 Alzheimer's disease, unspecified: Secondary | ICD-10-CM

## 2020-01-12 DIAGNOSIS — F0281 Dementia in other diseases classified elsewhere with behavioral disturbance: Secondary | ICD-10-CM | POA: Diagnosis not present

## 2020-01-12 NOTE — Progress Notes (Signed)
Location:  [facility] Room Number: 214-B Place of Service:  SNF (31) Provider:  Kenard Gower, DNP, FNP-BC  Patient Care Team: Pecola Lawless, MD as PCP - General (Internal Medicine)  Extended Emergency Contact Information Primary Emergency Contact: Robinson,Roberto Address: [address]          [place], [place] [address] [place] Home Phone: [phone] Relation: Son Secondary Emergency Contact: Reid,Cynthia  [place] Mobile Phone: [phone] Relation: Daughter  Code Status:  FULL CODE  Goals of care: Advanced Directive information Advanced Directives 07/28/2019  Does Patient Have a Medical Advance Directive? Yes  Type of Advance Directive (No Data)  Does patient want to make changes to medical advance directive? No - Patient declined  Copy of Healthcare Power of Attorney in Chart? -  Would patient like information on creating a medical advance directive? -  Pre-existing out of facility DNR order (yellow form or pink MOST form) -     Chief Complaint  Patient presents with  . Medical Management of Chronic Issues    Routine [facility] visit    HPI:  Pt is an 85 y.o. female seen today for medical management of chronic diseases.  She is a long-term care resident of [facility].  She has a PMH of dementia, hypothyroidism and hypertension.  Currently, she is participating in restorative programs such as 1) swallowing/eating, requires verbal cueing and sitting upright to prevent aspiration, and 2) restorative transfers, sit to stand transfers from wheelchair to parallel bars. She has occasional agitation reported.  She takes divalproex for mood disorder and aripiprazole for psychosis.  Latest TSH is 3.39, normal.  Currently, she is taking levothyroxine 125 mcg daily.  Her weight is 192.8 lbs, stable.   Past Medical History:  Diagnosis Date  . Chronic kidney disease    Stage III  .  Dementia with behavioral disturbance (HCC) 03/19/2015   Notated on FL2 Form from Dr. Dreama Saa [phone]  . Depression    previously saw Dr Donell Beers  . Diabetes mellitus without complication (HCC)    11/18/15 A1c 6%  . Dyslipidemia    11/18/15 Tg 74, HDL 60, LDL 90 on statin  . Hypertension   . Hypothyroidism    11/18/15 TSH 53.6, 02/07/16 TSH 9.42 @ [facility]  . MDD (major depressive disorder)    With behavioral disturbance  . Scabies    02/21/16 [facility],[place]; Rx: Permethrin cream  . Urinary tract infection due to Proteus 05/04/2019   Uniformly sensitive except to nitrofurantoin; Cipro prescribed  . Vitamin D deficiency    11/18/15 vitamin D 25-hydroxy 39   Past Surgical History:  Procedure Laterality Date  . no record     04/10/15 [facility] note: see old chart    No Known Allergies  Outpatient Encounter Medications as of 01/12/2020  Medication Sig  . acetaminophen (TYLENOL) 325 MG tablet Take 650 mg by mouth every 6 (six) hours as needed for mild pain or moderate pain.  . ARIPiprazole (ABILIFY) 5 MG tablet Take 7.5 mg by mouth daily. 1.5 tablets to = 7.5 mg  . aspirin EC 81 MG tablet Take 81 mg by mouth daily.   Marland Kitchen atorvastatin (LIPITOR) 40 MG tablet Take 40 mg by mouth at bedtime.   . bisacodyl (DULCOLAX) 10 MG suppository If not relieved by MOM, give 10 mg Bisacodyl suppositiory rectally X 1 dose in 24 hours as needed (Do not use constipation standing orders for residents  with renal failure/CFR less than 30. Contact MD for orders) (Physician Order)  . Calcium Carb-Cholecalciferol (CALCIUM-VITAMIN D3) 600-400 MG-UNIT TABS Take 1 tablet by mouth 2 (two) times daily.  . Cholecalciferol (VITAMIN D) 2000 units tablet Take 2,000 Units by mouth daily.   . divalproex (DEPAKOTE) 250 MG DR tablet Take 250 mg by mouth 2 (two) times daily.  Marland Kitchen donepezil (ARICEPT) 10 MG tablet Take 10 mg by mouth at bedtime.   . furosemide (LASIX) 20 MG tablet Take 20 mg by mouth every Monday,  Wednesday, and Friday.   . levothyroxine (SYNTHROID) 200 MCG tablet Take 200 mcg by mouth daily before breakfast. Take with 25 mcg to = 225 mcg  . levothyroxine (SYNTHROID) 25 MCG tablet Take 25 mcg by mouth daily before breakfast. Take with a 200 mcg tab to = 225 mcg  . magnesium hydroxide (MILK OF MAGNESIA) 400 MG/5ML suspension If no BM in 3 days, give 30 cc Milk of Magnesium p.o. x 1 dose in 24 hours as needed (Do not use standing constipation orders for residents with renal failure CFR less than 30. Contact MD for orders) (Physician Order)  . memantine (NAMENDA) 10 MG tablet Take 10 mg by mouth 2 (two) times daily.   . Multiple Vitamin (MULTI-VITAMIN DAILY PO) Take one tablet by mouth once daily   . Nutritional Supplement LIQD Take 120 mLs by mouth daily. MedPass  . PARoxetine (PAXIL) 10 MG tablet Take 15 mg by mouth at bedtime. Take 1-1/2 tablets to = 15 mg qd  . sennosides-docusate sodium (SENOKOT-S) 8.6-50 MG tablet Take 2 tablets by mouth daily.   . Skin Protectants, Misc. (MINERIN) CREA Apply topically to ankles twice a day for dry skin.  . Sodium Phosphates (RA SALINE ENEMA RE) If not relieved by Biscodyl suppository, give disposable Saline Enema rectally X 1 dose/24 hrs as needed (Do not use constipation standing orders for residents with renal failure/CFR less than 30. Contact MD for orders)(Physician Or   No facility-administered encounter medications on file as of 01/12/2020.    Review of Systems  GENERAL: No change in appetite, no fatigue, no weight changes, no fever, chills or weakness MOUTH and THROAT: Denies oral discomfort, gingival pain or bleeding RESPIRATORY: no cough, SOB, DOE, wheezing, hemoptysis CARDIAC: No chest pain, edema or palpitations GI: No abdominal pain, diarrhea, constipation, heart burn, nausea or vomiting GU: Denies dysuria, frequency, hematuria or discharge NEUROLOGICAL: Denies dizziness, syncope, numbness, or headache PSYCHIATRIC: +reports of  agitation   Immunization History  Administered Date(s) Administered  . Influenza-Unspecified 02/17/2016, 04/15/2017, 08/01/2018, 03/18/2019  . Moderna SARS-COVID-2 Vaccination 07/06/2019, 08/03/2019  . PPD Test 04/08/2016  . Pneumococcal Conjugate-13 01/11/2017  . Pneumococcal-Unspecified 02/16/2014  . Tdap 02/27/2019   Pertinent  Health Maintenance Due  Topic Date Due  . INFLUENZA VACCINE  01/17/2020  . FOOT EXAM  04/16/2020  . HEMOGLOBIN A1C  06/02/2020  . PNA vac Low Risk Adult  Completed  . OPHTHALMOLOGY EXAM  Discontinued  . URINE MICROALBUMIN  Discontinued  . DEXA SCAN  Discontinued   Fall Risk  01/16/2019 01/14/2018 01/10/2017 08/07/2016 04/20/2016  Falls in the past year? 1 No No Yes Yes  Number falls in past yr: 1 - - 2 or more 2 or more  Injury with Fall? 0 - - - Yes  Risk Factor Category  - - - - High Fall Risk  Risk for fall due to : History of fall(s);Impaired balance/gait;Impaired mobility - - - -  Follow up Falls evaluation completed;Falls  prevention discussed;Education provided - - - -     Vitals:   01/12/20 1147  BP: (!) 130/86  Pulse: 78  Resp: 18  Temp: 98.7 F (37.1 C)  TempSrc: Oral  Weight: 192 lb 12.8 oz (87.5 kg)  Height: 5\' 9"  (1.753 m)   Body mass index is 28.47 kg/m.  Physical Exam  GENERAL APPEARANCE: Well nourished. In no acute distress. Obese SKIN:  Skin is warm and dry.  MOUTH and THROAT: Lips are without lesions. Oral mucosa is moist and without lesions. Tongue is normal in shape, size, and color and without lesions RESPIRATORY: Breathing is even & unlabored, BS CTAB CARDIAC: RRR, no murmur,no extra heart sounds, trace BLE edema GI: Abdomen soft, normal BS, no masses, no tenderness EXTREMITIES:  Able to move X 4 extremities NEUROLOGICAL: There is no tremor. Speech is clear. Alert to self, disoriented to time and place. PSYCHIATRIC:  Affect and behavior are appropriate  Labs reviewed: Recent Labs    12/02/19 0000  NA 143  K 4.3   CL 108  CO2 24*  BUN 20  CREATININE 0.9  CALCIUM 9.3   Recent Labs    12/02/19 0000  AST 21  ALT 8  ALKPHOS 68  ALBUMIN 3.5   Recent Labs    05/06/19 0000 10/19/19 0000  WBC 3.4 3.6  NEUTROABS  --  1  HGB 12.5 12.3  HCT 37 37  PLT 126* 112*   Lab Results  Component Value Date   TSH 3.39 12/02/2019   Lab Results  Component Value Date   HGBA1C 6.1 12/02/2019   Lab Results  Component Value Date   CHOL 148 12/02/2019   HDL 42 12/02/2019   LDLCALC 83 12/02/2019   TRIG 114 12/02/2019    Assessment/Plan  1. Stage 2 chronic kidney disease Lab Results  Component Value Date   NA 143 12/02/2019   K 4.3 12/02/2019   CO2 24 (A) 12/02/2019   BUN 20 12/02/2019   CREATININE 0.9 12/02/2019   CALCIUM 9.3 12/02/2019   GFRNONAA 58.47 12/02/2019   GFRAA 67.77 12/02/2019   -  stable  2. Hypothyroidism due to acquired atrophy of thyroid Lab Results  Component Value Date   TSH 3.39 12/02/2019   Continue levothyroxine  3. Mixed hyperlipidemia Lab Results  Component Value Date   CHOL 148 12/02/2019   HDL 42 12/02/2019   LDLCALC 83 12/02/2019   TRIG 114 12/02/2019   -Continue Lipitor  4. Lower extremity edema -Stable, continue Lasix  5. Major depression, recurrent, chronic (HCC) -Stable, continue paroxetine  6. Mood disorder with psychosis (HCC) -Mood is stable, continue divalproex and aripiprazole -Followed up by psych NP  7. Alzheimer's dementia with behavioral disturbance, unspecified timing of dementia onset (HCC) -Continue donepezil and memantine -Continue supportive care     Family/ staff Communication: Discussed plan of care with resident and charge nurse.  Labs/tests ordered: None  Goals of care:   Long-term care   12/04/2019, DNP, FNP-BC [facility] [phone] (Monday-Friday 8:00 a.m. - 5:00 p.m.) [phone] (after hours)

## 2020-01-13 ENCOUNTER — Non-Acute Institutional Stay (SKILLED_NURSING_FACILITY): Payer: Medicare Other | Admitting: Adult Health

## 2020-01-13 ENCOUNTER — Encounter: Payer: Self-pay | Admitting: Adult Health

## 2020-01-13 DIAGNOSIS — F0281 Dementia in other diseases classified elsewhere with behavioral disturbance: Secondary | ICD-10-CM | POA: Diagnosis not present

## 2020-01-13 DIAGNOSIS — B351 Tinea unguium: Secondary | ICD-10-CM | POA: Diagnosis not present

## 2020-01-13 DIAGNOSIS — Z7984 Long term (current) use of oral hypoglycemic drugs: Secondary | ICD-10-CM | POA: Diagnosis not present

## 2020-01-13 DIAGNOSIS — M6281 Muscle weakness (generalized): Secondary | ICD-10-CM | POA: Diagnosis not present

## 2020-01-13 DIAGNOSIS — E782 Mixed hyperlipidemia: Secondary | ICD-10-CM

## 2020-01-13 DIAGNOSIS — E034 Atrophy of thyroid (acquired): Secondary | ICD-10-CM

## 2020-01-13 DIAGNOSIS — G309 Alzheimer's disease, unspecified: Secondary | ICD-10-CM | POA: Diagnosis not present

## 2020-01-13 DIAGNOSIS — R600 Localized edema: Secondary | ICD-10-CM

## 2020-01-13 DIAGNOSIS — Z7189 Other specified counseling: Secondary | ICD-10-CM

## 2020-01-13 DIAGNOSIS — F339 Major depressive disorder, recurrent, unspecified: Secondary | ICD-10-CM | POA: Diagnosis not present

## 2020-01-13 DIAGNOSIS — F39 Unspecified mood [affective] disorder: Secondary | ICD-10-CM | POA: Diagnosis not present

## 2020-01-13 DIAGNOSIS — E1140 Type 2 diabetes mellitus with diabetic neuropathy, unspecified: Secondary | ICD-10-CM | POA: Diagnosis not present

## 2020-01-13 DIAGNOSIS — M79674 Pain in right toe(s): Secondary | ICD-10-CM | POA: Diagnosis not present

## 2020-01-13 NOTE — Progress Notes (Signed)
Location:  [facility] Room Number: 214-B Place of Service:  SNF (31) Provider:  Kenard Gower, DNP, FNP-BC  Patient Care Team: Pecola Lawless, MD as PCP - General (Internal Medicine)  Extended Emergency Contact Information Primary Emergency Contact: Robinson,Roberto Address: [address]          [place], [place] [address] [place] Home Phone: [phone] Relation: Son Secondary Emergency Contact: Reid,Cynthia  [place] Mobile Phone: [phone] Relation: Daughter  Code Status:  FULL CODE  Goals of care: Advanced Directive information Advanced Directives 07/28/2019  Does Patient Have a Medical Advance Directive? Yes  Type of Advance Directive (No Data)  Does patient want to make changes to medical advance directive? No - Patient declined  Copy of Healthcare Power of Attorney in Chart? -  Would patient like information on creating a medical advance directive? -  Pre-existing out of facility DNR order (yellow form or pink MOST form) -     Chief Complaint  Patient presents with  . Advanced Directive    Patient is seen for a care plan meeting    HPI:  Pt is an 85 y.o. female who had a care plan meeting today attended by social worker, MDS coordinator and daughter, April, who attended via teleconference.  Resident remains to be full code.  Discussed medications, vital signs and weights.  Multiple medical questions were answered.  Rehab director working on family request for a comfortable wheelchair.  Family inquiring if seizures are allowed to come in.  Family was informed that as long as the sitter had COVID-19 vaccine then it is allowed. She currently participates in restorative swallowing. She requires verbal cuing and sitting upright when eating. She has restorative transfers, sit to stand transfers from wheelchair to parallel bars. Daughter suggested that resident's activity be increased during the day so she can  sleep at night.  She is a long-term care resident of [facility]. She has a PMH of dementia, hypothyroidism and hypertension.  The meeting lasted for 30 minutes.   Past Medical History:  Diagnosis Date  . Chronic kidney disease    Stage III  . Dementia with behavioral disturbance (HCC) 03/19/2015   Notated on FL2 Form from Dr. Dreama Saa [phone]  . Depression    previously saw Dr Donell Beers  . Diabetes mellitus without complication (HCC)    11/18/15 A1c 6%  . Dyslipidemia    11/18/15 Tg 74, HDL 60, LDL 90 on statin  . Hypertension   . Hypothyroidism    11/18/15 TSH 53.6, 02/07/16 TSH 9.42 @ [facility]  . MDD (major depressive disorder)    With behavioral disturbance  . Scabies    02/21/16 [facility],[place]; Rx: Permethrin cream  . Urinary tract infection due to Proteus 05/04/2019   Uniformly sensitive except to nitrofurantoin; Cipro prescribed  . Vitamin D deficiency    11/18/15 vitamin D 25-hydroxy 39   Past Surgical History:  Procedure Laterality Date  . no record     04/10/15 [facility] note: see old chart    No Known Allergies  Outpatient Encounter Medications as of 01/13/2020  Medication Sig  . acetaminophen (TYLENOL) 325 MG tablet Take 650 mg by mouth every 6 (six) hours as needed for mild pain or moderate pain.  . ARIPiprazole (ABILIFY) 5 MG tablet Take 7.5 mg by mouth daily. 1.5 tablets to = 7.5 mg  . aspirin EC 81 MG tablet Take 81 mg by mouth daily.   Marland Kitchen  atorvastatin (LIPITOR) 40 MG tablet Take 40 mg by mouth at bedtime.   . bisacodyl (DULCOLAX) 10 MG suppository If not relieved by MOM, give 10 mg Bisacodyl suppositiory rectally X 1 dose in 24 hours as needed (Do not use constipation standing orders for residents with renal failure/CFR less than 30. Contact MD for orders) (Physician Order)  . Calcium Carb-Cholecalciferol (CALCIUM-VITAMIN D3) 600-400 MG-UNIT TABS Take 1 tablet by mouth 2 (two) times daily.  . Cholecalciferol (VITAMIN D)  2000 units tablet Take 2,000 Units by mouth daily.   . divalproex (DEPAKOTE) 250 MG DR tablet Take 250 mg by mouth 2 (two) times daily.  Marland Kitchen donepezil (ARICEPT) 10 MG tablet Take 10 mg by mouth at bedtime.   . furosemide (LASIX) 20 MG tablet Take 20 mg by mouth every Monday, Wednesday, and Friday.   . levothyroxine (SYNTHROID) 200 MCG tablet Take 200 mcg by mouth daily before breakfast. Take with 25 mcg to = 225 mcg  . levothyroxine (SYNTHROID) 25 MCG tablet Take 25 mcg by mouth daily before breakfast. Take with a 200 mcg tab to = 225 mcg  . magnesium hydroxide (MILK OF MAGNESIA) 400 MG/5ML suspension If no BM in 3 days, give 30 cc Milk of Magnesium p.o. x 1 dose in 24 hours as needed (Do not use standing constipation orders for residents with renal failure CFR less than 30. Contact MD for orders) (Physician Order)  . memantine (NAMENDA) 10 MG tablet Take 10 mg by mouth 2 (two) times daily.   . Multiple Vitamin (MULTI-VITAMIN DAILY PO) Take one tablet by mouth once daily   . Nutritional Supplement LIQD Take 120 mLs by mouth daily. MedPass  . PARoxetine (PAXIL) 10 MG tablet Take 15 mg by mouth at bedtime. Take 1-1/2 tablets to = 15 mg qd  . sennosides-docusate sodium (SENOKOT-S) 8.6-50 MG tablet Take 2 tablets by mouth daily.   . Skin Protectants, Misc. (MINERIN) CREA Apply topically to ankles twice a day for dry skin.  . Sodium Phosphates (RA SALINE ENEMA RE) If not relieved by Biscodyl suppository, give disposable Saline Enema rectally X 1 dose/24 hrs as needed (Do not use constipation standing orders for residents with renal failure/CFR less than 30. Contact MD for orders)(Physician Or   No facility-administered encounter medications on file as of 01/13/2020.    Review of Systems  GENERAL: No change in appetite, no fatigue, no weight changes, no fever, chills or weakness MOUTH and THROAT: Denies oral discomfort, gingival pain or bleeding RESPIRATORY: no cough, SOB, DOE, wheezing,  hemoptysis CARDIAC: No chest pain, edema or palpitations GI: No abdominal pain, diarrhea, constipation, heart burn, nausea or vomiting GU: Denies dysuria, frequency, hematuria or discharge NEUROLOGICAL: Denies dizziness, syncope, numbness, or headache PSYCHIATRIC: Denies feelings of depression or anxiety. No report of hallucinations, insomnia, paranoia, or agitation   Immunization History  Administered Date(s) Administered  . Influenza-Unspecified 02/17/2016, 04/15/2017, 08/01/2018, 03/18/2019  . Moderna SARS-COVID-2 Vaccination 07/06/2019, 08/03/2019  . PPD Test 04/08/2016  . Pneumococcal Conjugate-13 01/11/2017  . Pneumococcal-Unspecified 02/16/2014  . Tdap 02/27/2019   Pertinent  Health Maintenance Due  Topic Date Due  . INFLUENZA VACCINE  01/17/2020  . FOOT EXAM  04/16/2020  . HEMOGLOBIN A1C  06/02/2020  . PNA vac Low Risk Adult  Completed  . OPHTHALMOLOGY EXAM  Discontinued  . URINE MICROALBUMIN  Discontinued  . DEXA SCAN  Discontinued   Fall Risk  01/16/2019 01/14/2018 01/10/2017 08/07/2016 04/20/2016  Falls in the past year? 1 No No Yes Yes  Number falls in past yr: 1 - - 2 or more 2 or more  Injury with Fall? 0 - - - Yes  Risk Factor Category  - - - - High Fall Risk  Risk for fall due to : History of fall(s);Impaired balance/gait;Impaired mobility - - - -  Follow up Falls evaluation completed;Falls prevention discussed;Education provided - - - -     Vitals:   01/13/20 1113  BP: (!) 103/60  Pulse: 67  Resp: 18  Temp: (!) 97.5 F (36.4 C)  TempSrc: Oral  Weight: 192 lb 12.8 oz (87.5 kg)  Height: 5\' 9"  (1.753 m)   Body mass index is 28.47 kg/m.  Physical Exam  GENERAL APPEARANCE: Well nourished. In no acute distress. Obese SKIN:  Skin is warm and dry.  MOUTH and THROAT: Lips are without lesions. Oral mucosa is moist and without lesions. Tongue is normal in shape, size, and color and without lesions RESPIRATORY: Breathing is even & unlabored, BS CTAB CARDIAC:  RRR, no murmur,no extra heart sounds, BLE trace edema GI: Abdomen soft, normal BS, no masses, no tenderness EXTREMITIES: Able to move x4 extremities NEUROLOGICAL: There is no tremor. Speech is clear. Alert to self, disoriented to time and place. PSYCHIATRIC:  Affect and behavior are appropriate  Labs reviewed: Recent Labs    12/02/19 0000  NA 143  K 4.3  CL 108  CO2 24*  BUN 20  CREATININE 0.9  CALCIUM 9.3   Recent Labs    12/02/19 0000  AST 21  ALT 8  ALKPHOS 68  ALBUMIN 3.5   Recent Labs    05/06/19 0000 10/19/19 0000  WBC 3.4 3.6  NEUTROABS  --  1  HGB 12.5 12.3  HCT 37 37  PLT 126* 112*   Lab Results  Component Value Date   TSH 3.39 12/02/2019   Lab Results  Component Value Date   HGBA1C 6.1 12/02/2019   Lab Results  Component Value Date   CHOL 148 12/02/2019   HDL 42 12/02/2019   LDLCALC 83 12/02/2019   TRIG 114 12/02/2019     Assessment/Plan  1. Advance care planning -Remains to be full code -Discussed medications, vital signs and weights  2. Hypothyroidism due to acquired atrophy of thyroid Lab Results  Component Value Date   TSH 3.39 12/02/2019   -Continue levothyroxine  3. Lower extremity edema -Stable, continue Lasix  4. Mixed hyperlipidemia Lab Results  Component Value Date   CHOL 148 12/02/2019   HDL 42 12/02/2019   LDLCALC 83 12/02/2019   TRIG 114 12/02/2019   -Lipitor   5. Mood disorder with psychosis (HCC) Mood is stable, continue divalproex DR  6. Major depression, recurrent, chronic (HCC) -  Stable, continue aripiprazole and paroxetine -Followed up by psych NP  7. Alzheimer's dementia with behavioral disturbance, unspecified timing of dementia onset (HCC) -Continue supportive care and fall precautions -Continue memantine and donepezil    Family/ staff Communication: Discussed plan of care with daughter, April, and IDT  Labs/tests ordered:  None  Goals of care:  Long-term care    May,  DNP, FNP-BC [facility] [phone] (Monday-Friday 8:00 a.m. - 5:00 p.m.) [phone] (after hours)

## 2020-01-14 DIAGNOSIS — M6281 Muscle weakness (generalized): Secondary | ICD-10-CM | POA: Diagnosis not present

## 2020-01-18 DIAGNOSIS — M6281 Muscle weakness (generalized): Secondary | ICD-10-CM | POA: Diagnosis not present

## 2020-01-19 DIAGNOSIS — M6281 Muscle weakness (generalized): Secondary | ICD-10-CM | POA: Diagnosis not present

## 2020-01-20 DIAGNOSIS — M6281 Muscle weakness (generalized): Secondary | ICD-10-CM | POA: Diagnosis not present

## 2020-01-22 DIAGNOSIS — M6281 Muscle weakness (generalized): Secondary | ICD-10-CM | POA: Diagnosis not present

## 2020-01-25 DIAGNOSIS — M6281 Muscle weakness (generalized): Secondary | ICD-10-CM | POA: Diagnosis not present

## 2020-01-26 DIAGNOSIS — M6281 Muscle weakness (generalized): Secondary | ICD-10-CM | POA: Diagnosis not present

## 2020-01-27 DIAGNOSIS — M6281 Muscle weakness (generalized): Secondary | ICD-10-CM | POA: Diagnosis not present

## 2020-01-28 DIAGNOSIS — M6281 Muscle weakness (generalized): Secondary | ICD-10-CM | POA: Diagnosis not present

## 2020-01-29 DIAGNOSIS — M6281 Muscle weakness (generalized): Secondary | ICD-10-CM | POA: Diagnosis not present

## 2020-02-02 ENCOUNTER — Non-Acute Institutional Stay (SKILLED_NURSING_FACILITY): Payer: Medicare Other | Admitting: Adult Health

## 2020-02-02 ENCOUNTER — Encounter: Payer: Self-pay | Admitting: Adult Health

## 2020-02-02 DIAGNOSIS — G309 Alzheimer's disease, unspecified: Secondary | ICD-10-CM | POA: Diagnosis not present

## 2020-02-02 DIAGNOSIS — F39 Unspecified mood [affective] disorder: Secondary | ICD-10-CM | POA: Diagnosis not present

## 2020-02-02 DIAGNOSIS — N182 Chronic kidney disease, stage 2 (mild): Secondary | ICD-10-CM | POA: Diagnosis not present

## 2020-02-02 DIAGNOSIS — F339 Major depressive disorder, recurrent, unspecified: Secondary | ICD-10-CM

## 2020-02-02 DIAGNOSIS — R600 Localized edema: Secondary | ICD-10-CM | POA: Diagnosis not present

## 2020-02-02 DIAGNOSIS — M6281 Muscle weakness (generalized): Secondary | ICD-10-CM | POA: Diagnosis not present

## 2020-02-02 DIAGNOSIS — E034 Atrophy of thyroid (acquired): Secondary | ICD-10-CM

## 2020-02-02 DIAGNOSIS — F0281 Dementia in other diseases classified elsewhere with behavioral disturbance: Secondary | ICD-10-CM

## 2020-02-02 DIAGNOSIS — E782 Mixed hyperlipidemia: Secondary | ICD-10-CM

## 2020-02-02 DIAGNOSIS — E1122 Type 2 diabetes mellitus with diabetic chronic kidney disease: Secondary | ICD-10-CM | POA: Diagnosis not present

## 2020-02-02 NOTE — Progress Notes (Signed)
Location:  [facility] Room Number: 214-B Place of Service:  SNF (31) Provider:  Kenard Gower, DNP, FNP-BC  Patient Care Team: Pecola Lawless, MD as PCP - General (Internal Medicine)  Extended Emergency Contact Information Primary Emergency Contact: Robinson,Roberto Address: [address]          [place], [place] [address] [place] Home Phone: [phone] Relation: Son Secondary Emergency Contact: Reid,Cynthia  [place] Mobile Phone: [phone] Relation: Daughter  Code Status:  FULL CODE  Goals of care: Advanced Directive information Advanced Directives 07/28/2019  Does Patient Have a Medical Advance Directive? Yes  Type of Advance Directive (No Data)  Does patient want to make changes to medical advance directive? No - Patient declined  Copy of Healthcare Power of Attorney in Chart? -  Would patient like information on creating a medical advance directive? -  Pre-existing out of facility DNR order (yellow form or pink MOST form) -     Chief Complaint  Patient presents with  . Medical Management of Chronic Issues    Routine [facility] visit    HPI:  Pt is an 85 y.o. female seen today for medical management of chronic diseases.  She is a long-term care resident of [facility].  She has a PMH of dementia, hypothyroidism and hypertension. She was seen in the room today. Currently, she participates in restorative programs, 1) eating/swallowing, she requires verbal cuing in sitting in upright position to complete meals due to aspiration risk, on finger foods and regular diet, and 2) Transfers, to complete sit to stand transfers from wheelchair to parallel bars to prevent loss of function. She was reported to have combative episodes during care.  She takes aripiprazole 7.5 mg daily for psychosis and divalproex DR 125 mg 2 capsules = 250 mg twice a day for mood disorder.  Latest TSH 3.39,  12/02/2019.  She takes levothyroxine 225 mcg daily for hypothyroidism.   Past Medical History:  Diagnosis Date  . Chronic kidney disease    Stage III  . Dementia with behavioral disturbance (HCC) 03/19/2015   Notated on FL2 Form from Dr. Dreama Saa [phone]  . Depression    previously saw Dr Donell Beers  . Diabetes mellitus without complication (HCC)    11/18/15 A1c 6%  . Dyslipidemia    11/18/15 Tg 74, HDL 60, LDL 90 on statin  . Hypertension   . Hypothyroidism    11/18/15 TSH 53.6, 02/07/16 TSH 9.42 @ [facility]  . MDD (major depressive disorder)    With behavioral disturbance  . Scabies    02/21/16 [facility],[place]; Rx: Permethrin cream  . Urinary tract infection due to Proteus 05/04/2019   Uniformly sensitive except to nitrofurantoin; Cipro prescribed  . Vitamin D deficiency    11/18/15 vitamin D 25-hydroxy 39   Past Surgical History:  Procedure Laterality Date  . no record     04/10/15 [facility] note: see old chart    No Known Allergies  Outpatient Encounter Medications as of 02/02/2020  Medication Sig  . acetaminophen (TYLENOL) 325 MG tablet Take 650 mg by mouth every 6 (six) hours as needed for mild pain or moderate pain.  . ARIPiprazole (ABILIFY) 5 MG tablet Take 7.5 mg by mouth daily. 1.5 tablets to = 7.5 mg  . aspirin EC 81 MG tablet Take 81 mg by mouth daily.   Marland Kitchen atorvastatin (LIPITOR) 40 MG tablet Take 40 mg by mouth at bedtime.   Marland Kitchen  bisacodyl (DULCOLAX) 10 MG suppository If not relieved by MOM, give 10 mg Bisacodyl suppositiory rectally X 1 dose in 24 hours as needed (Do not use constipation standing orders for residents with renal failure/CFR less than 30. Contact MD for orders) (Physician Order)  . Calcium Carb-Cholecalciferol (CALCIUM-VITAMIN D3) 600-400 MG-UNIT TABS Take 1 tablet by mouth 2 (two) times daily.  . Cholecalciferol (VITAMIN D) 2000 units tablet Take 2,000 Units by mouth daily.   . divalproex (DEPAKOTE) 250 MG DR tablet Take 250 mg by mouth 2  (two) times daily.  Marland Kitchen donepezil (ARICEPT) 10 MG tablet Take 10 mg by mouth at bedtime.   . furosemide (LASIX) 20 MG tablet Take 20 mg by mouth every Monday, Wednesday, and Friday.   . levothyroxine (SYNTHROID) 200 MCG tablet Take 200 mcg by mouth daily before breakfast. Take with 25 mcg to = 225 mcg  . levothyroxine (SYNTHROID) 25 MCG tablet Take 25 mcg by mouth daily before breakfast. Take with a 200 mcg tab to = 225 mcg  . magnesium hydroxide (MILK OF MAGNESIA) 400 MG/5ML suspension If no BM in 3 days, give 30 cc Milk of Magnesium p.o. x 1 dose in 24 hours as needed (Do not use standing constipation orders for residents with renal failure CFR less than 30. Contact MD for orders) (Physician Order)  . memantine (NAMENDA) 10 MG tablet Take 10 mg by mouth 2 (two) times daily.   . Multiple Vitamin (MULTI-VITAMIN DAILY PO) Take one tablet by mouth once daily   . Nutritional Supplement LIQD Take 120 mLs by mouth daily. MedPass  . PARoxetine (PAXIL) 10 MG tablet Take 15 mg by mouth at bedtime. Take 1-1/2 tablets to = 15 mg qd  . sennosides-docusate sodium (SENOKOT-S) 8.6-50 MG tablet Take 2 tablets by mouth daily.   . Skin Protectants, Misc. (MINERIN) CREA Apply topically to ankles twice a day for dry skin.  . Sodium Phosphates (RA SALINE ENEMA RE) If not relieved by Biscodyl suppository, give disposable Saline Enema rectally X 1 dose/24 hrs as needed (Do not use constipation standing orders for residents with renal failure/CFR less than 30. Contact MD for orders)(Physician Or   No facility-administered encounter medications on file as of 02/02/2020.    Review of Systems  GENERAL: No change in appetite, no fatigue, no weight changes, no fever, chills or weakness MOUTH and THROAT: Denies oral discomfort, gingival pain or bleeding, pain from teeth or hoarseness   RESPIRATORY: no cough, SOB, DOE, wheezing, hemoptysis CARDIAC: No chest pain, edema or palpitations GI: No abdominal pain, diarrhea,  constipation, heart burn, nausea or vomiting GU: Denies dysuria, frequency, hematuria or discharge NEUROLOGICAL: Denies dizziness, syncope, numbness, or headache PSYCHIATRIC: +agitation   Immunization History  Administered Date(s) Administered  . Influenza-Unspecified 02/17/2016, 04/15/2017, 08/01/2018, 03/18/2019  . Moderna SARS-COVID-2 Vaccination 07/06/2019, 08/03/2019  . PPD Test 04/08/2016  . Pneumococcal Conjugate-13 01/11/2017  . Pneumococcal-Unspecified 02/16/2014  . Tdap 02/27/2019   Pertinent  Health Maintenance Due  Topic Date Due  . INFLUENZA VACCINE  01/17/2020  . FOOT EXAM  04/16/2020  . HEMOGLOBIN A1C  06/02/2020  . PNA vac Low Risk Adult  Completed  . OPHTHALMOLOGY EXAM  Discontinued  . URINE MICROALBUMIN  Discontinued  . DEXA SCAN  Discontinued   Fall Risk  01/16/2019 01/14/2018 01/10/2017 08/07/2016 04/20/2016  Falls in the past year? 1 No No Yes Yes  Number falls in past yr: 1 - - 2 or more 2 or more  Injury with Fall? 0 - - -  Yes  Risk Factor Category  - - - - High Fall Risk  Risk for fall due to : History of fall(s);Impaired balance/gait;Impaired mobility - - - -  Follow up Falls evaluation completed;Falls prevention discussed;Education provided - - - -     Vitals:   02/02/20 1125  BP: 136/61  Pulse: 68  Resp: 18  Temp: 97.7 F (36.5 C)  TempSrc: Oral  Weight: 182 lb 3.2 oz (82.6 kg)  Height: 5\' 9"  (1.753 m)   Body mass index is 26.91 kg/m.  Physical Exam  GENERAL APPEARANCE: Well nourished. In no acute distress. Normal body habitus SKIN:  Skin is warm and dry.  MOUTH and THROAT: Lips are without lesions. Oral mucosa is moist and without lesions. Tongue is normal in shape, size, and color and without lesions RESPIRATORY: Breathing is even & unlabored, BS CTAB CARDIAC: RRR, no murmur,no extra heart sounds, no edema GI: Abdomen soft, normal BS, no masses, no tenderness NEUROLOGICAL: There is no tremor. Speech is clear. Alert to self, disoriented  to time and place. PSYCHIATRIC:  Affect and behavior are appropriate  Labs reviewed: Recent Labs    12/02/19 0000  NA 143  K 4.3  CL 108  CO2 24*  BUN 20  CREATININE 0.9  CALCIUM 9.3   Recent Labs    12/02/19 0000  AST 21  ALT 8  ALKPHOS 68  ALBUMIN 3.5   Recent Labs    05/06/19 0000 10/19/19 0000  WBC 3.4 3.6  NEUTROABS  --  1  HGB 12.5 12.3  HCT 37 37  PLT 126* 112*   Lab Results  Component Value Date   TSH 3.39 12/02/2019   Lab Results  Component Value Date   HGBA1C 6.1 12/02/2019   Lab Results  Component Value Date   CHOL 148 12/02/2019   HDL 42 12/02/2019   LDLCALC 83 12/02/2019   TRIG 114 12/02/2019    Assessment/Plan  1. Lower extremity edema - no edema, continue furosemide  2. Type 2 diabetes mellitus with stage 2 chronic kidney disease, without long-term current use of insulin (HCC) Lab Results  Component Value Date   HGBA1C 6.1 12/02/2019   -Diet controlled  3. Mixed hyperlipidemia Lab Results  Component Value Date   CHOL 148 12/02/2019   HDL 42 12/02/2019   LDLCALC 83 12/02/2019   TRIG 114 12/02/2019   -Continue Lipitor  4. Hypothyroidism due to acquired atrophy of thyroid Lab Results  Component Value Date   TSH 3.39 12/02/2019   -Continue levothyroxine  5. Mood disorder with psychosis (HCC) -Staff reported combative episodes during care/agitation -Continue divalproex DR and Abilify -Will refer for psych consult follow-up  6. Major depression, recurrent, chronic (HCC) -Continue paroxetine -Follow-up with psych NP  7. Alzheimer's dementia with behavioral disturbance, unspecified timing of dementia onset (HCC) -Continue donepezil and memantine -Continue supportive care    Family/ staff Communication:   Discussed plan of care with resident and charge nurse.  Labs/tests ordered: None  Goals of care:   Long-term care   12/04/2019, DNP, MSN, FNP-BC [facility] [phone]  (Monday-Friday 8:00 a.m. - 5:00 p.m.) [phone] (after hours)

## 2020-02-03 DIAGNOSIS — M6281 Muscle weakness (generalized): Secondary | ICD-10-CM | POA: Diagnosis not present

## 2020-02-04 DIAGNOSIS — M6281 Muscle weakness (generalized): Secondary | ICD-10-CM | POA: Diagnosis not present

## 2020-02-05 DIAGNOSIS — M6281 Muscle weakness (generalized): Secondary | ICD-10-CM | POA: Diagnosis not present

## 2020-02-08 DIAGNOSIS — M6281 Muscle weakness (generalized): Secondary | ICD-10-CM | POA: Diagnosis not present

## 2020-02-09 DIAGNOSIS — M6281 Muscle weakness (generalized): Secondary | ICD-10-CM | POA: Diagnosis not present

## 2020-02-15 DIAGNOSIS — M25551 Pain in right hip: Secondary | ICD-10-CM | POA: Diagnosis not present

## 2020-02-24 DIAGNOSIS — F338 Other recurrent depressive disorders: Secondary | ICD-10-CM | POA: Diagnosis not present

## 2020-02-24 DIAGNOSIS — F39 Unspecified mood [affective] disorder: Secondary | ICD-10-CM | POA: Diagnosis not present

## 2020-02-24 DIAGNOSIS — F22 Delusional disorders: Secondary | ICD-10-CM | POA: Diagnosis not present

## 2020-02-24 DIAGNOSIS — F0390 Unspecified dementia without behavioral disturbance: Secondary | ICD-10-CM | POA: Diagnosis not present

## 2020-02-26 DIAGNOSIS — F329 Major depressive disorder, single episode, unspecified: Secondary | ICD-10-CM | POA: Diagnosis not present

## 2020-03-02 ENCOUNTER — Encounter: Payer: Self-pay | Admitting: Adult Health

## 2020-03-02 ENCOUNTER — Non-Acute Institutional Stay (SKILLED_NURSING_FACILITY): Payer: Medicare Other | Admitting: Adult Health

## 2020-03-02 DIAGNOSIS — E1122 Type 2 diabetes mellitus with diabetic chronic kidney disease: Secondary | ICD-10-CM | POA: Diagnosis not present

## 2020-03-02 DIAGNOSIS — N182 Chronic kidney disease, stage 2 (mild): Secondary | ICD-10-CM

## 2020-03-02 DIAGNOSIS — Z9189 Other specified personal risk factors, not elsewhere classified: Secondary | ICD-10-CM | POA: Diagnosis not present

## 2020-03-02 NOTE — Progress Notes (Signed)
Location:  [facility] Room Number: 214-B Place of Service:  SNF (31) Provider:  Kenard Gower, DNP, FNP-BC  Patient Care Team: Pecola Lawless, MD as PCP - General (Internal Medicine)  Extended Emergency Contact Information Primary Emergency Contact: Robinson,Roberto Address: [address]          [place], [place] [address] [place] Home Phone: [phone] Relation: Son Secondary Emergency Contact: Reid,Cynthia  [place] Mobile Phone: [phone] Relation: Daughter  Code Status:  Full Code  Goals of care: Advanced Directive information Advanced Directives 07/28/2019  Does Patient Have a Medical Advance Directive? Yes  Type of Advance Directive (No Data)  Does patient want to make changes to medical advance directive? No - Patient declined  Copy of Healthcare Power of Attorney in Chart? -  Would patient like information on creating a medical advance directive? -  Pre-existing out of facility DNR order (yellow form or pink MOST form) -     Chief Complaint  Patient presents with  . Acute Visit    Patient is seen for diabetic shoe evaluation.    HPI:  Pt is an 85 y.o. female seen today for evaluation for diabetic shoes.  She is a long-term care resident of [facility].  She has a PMH of dementia, hypothyroidism and hypertension.  Latest hgbA1c 6.1, 12/02/19. He does not take any diabetic medications. Dorsalis pedis pulse are present. Toenails are thickened and discolored. No wound on feet. She has bilateral feet trace edema.   Past Medical History:  Diagnosis Date  . Chronic kidney disease    Stage III  . Dementia with behavioral disturbance (HCC) 03/19/2015   Notated on FL2 Form from Dr. Dreama Saa [phone]  . Depression    previously saw Dr Donell Beers  . Diabetes mellitus without complication (HCC)    11/18/15 A1c 6%  . Dyslipidemia    11/18/15 Tg 74, HDL 60, LDL 90 on statin  .  Hypertension   . Hypothyroidism    11/18/15 TSH 53.6, 02/07/16 TSH 9.42 @ [facility]  . MDD (major depressive disorder)    With behavioral disturbance  . Scabies    02/21/16 [facility],[place]; Rx: Permethrin cream  . Urinary tract infection due to Proteus 05/04/2019   Uniformly sensitive except to nitrofurantoin; Cipro prescribed  . Vitamin D deficiency    11/18/15 vitamin D 25-hydroxy 39   Past Surgical History:  Procedure Laterality Date  . no record     04/10/15 [facility] note: see old chart    No Known Allergies  Outpatient Encounter Medications as of 03/02/2020  Medication Sig  . acetaminophen (TYLENOL) 325 MG tablet Take 650 mg by mouth every 6 (six) hours as needed for mild pain or moderate pain.  . ARIPiprazole (ABILIFY) 5 MG tablet Take 7.5 mg by mouth daily. 1.5 tablets to = 7.5 mg  . aspirin EC 81 MG tablet Take 81 mg by mouth daily.   Marland Kitchen atorvastatin (LIPITOR) 40 MG tablet Take 40 mg by mouth at bedtime.   . bisacodyl (DULCOLAX) 10 MG suppository If not relieved by MOM, give 10 mg Bisacodyl suppositiory rectally X 1 dose in 24 hours as needed (Do not use constipation standing orders for residents with renal failure/CFR less than 30. Contact MD for orders) (Physician Order)  . Calcium Carb-Cholecalciferol (CALCIUM-VITAMIN D3) 600-400 MG-UNIT TABS Take 1 tablet by mouth 2 (two) times daily.  . Cholecalciferol (VITAMIN D) 2000 units tablet Take 2,000 Units  by mouth daily.   . divalproex (DEPAKOTE) 250 MG DR tablet Take 250 mg by mouth 2 (two) times daily.  Marland Kitchen donepezil (ARICEPT) 10 MG tablet Take 10 mg by mouth at bedtime.   . furosemide (LASIX) 20 MG tablet Take 20 mg by mouth every Monday, Wednesday, and Friday.   . levothyroxine (SYNTHROID) 200 MCG tablet Take 200 mcg by mouth daily before breakfast. Take with 25 mcg to = 225 mcg  . levothyroxine (SYNTHROID) 25 MCG tablet Take 25 mcg by mouth daily before breakfast. Take with a 200 mcg tab to = 225 mcg  . magnesium  hydroxide (MILK OF MAGNESIA) 400 MG/5ML suspension If no BM in 3 days, give 30 cc Milk of Magnesium p.o. x 1 dose in 24 hours as needed (Do not use standing constipation orders for residents with renal failure CFR less than 30. Contact MD for orders) (Physician Order)  . memantine (NAMENDA) 10 MG tablet Take 10 mg by mouth 2 (two) times daily.   . Multiple Vitamin (MULTI-VITAMIN DAILY PO) Take one tablet by mouth once daily   . Nutritional Supplement LIQD Take 120 mLs by mouth daily. MedPass  . PARoxetine (PAXIL) 10 MG tablet Take 15 mg by mouth at bedtime. Take 1-1/2 tablets to = 15 mg qd  . sennosides-docusate sodium (SENOKOT-S) 8.6-50 MG tablet Take 2 tablets by mouth daily.   . Skin Protectants, Misc. (MINERIN) CREA Apply topically to ankles twice a day for dry skin.  . Sodium Phosphates (RA SALINE ENEMA RE) If not relieved by Biscodyl suppository, give disposable Saline Enema rectally X 1 dose/24 hrs as needed (Do not use constipation standing orders for residents with renal failure/CFR less than 30. Contact MD for orders)(Physician Or   No facility-administered encounter medications on file as of 03/02/2020.    Review of Systems  GENERAL: No change in appetite, no fatigue, no weight changes, no fever, chills or weakness MOUTH and THROAT: Denies oral discomfort, gingival pain or bleeding RESPIRATORY: no cough, SOB, DOE, wheezing, hemoptysis CARDIAC: No chest pain or palpitations GI: No abdominal pain, diarrhea, constipation, heart burn, nausea or vomiting GU: Denies dysuria, frequency, hematuria or dischargec NEUROLOGICAL: Denies dizziness, syncope, numbness, or headache PSYCHIATRIC: Denies feelings of depression or anxiety.   Immunization History  Administered Date(s) Administered  . Influenza-Unspecified 02/17/2016, 04/15/2017, 08/01/2018, 03/18/2019  . Moderna SARS-COVID-2 Vaccination 07/06/2019, 08/03/2019  . PPD Test 04/08/2016  . Pneumococcal Conjugate-13 01/11/2017  .  Pneumococcal-Unspecified 02/16/2014  . Tdap 02/27/2019   Pertinent  Health Maintenance Due  Topic Date Due  . INFLUENZA VACCINE  01/17/2020  . FOOT EXAM  04/16/2020  . HEMOGLOBIN A1C  06/02/2020  . PNA vac Low Risk Adult  Completed  . OPHTHALMOLOGY EXAM  Discontinued  . URINE MICROALBUMIN  Discontinued  . DEXA SCAN  Discontinued   Fall Risk  01/16/2019 01/14/2018 01/10/2017 08/07/2016 04/20/2016  Falls in the past year? 1 No No Yes Yes  Number falls in past yr: 1 - - 2 or more 2 or more  Injury with Fall? 0 - - - Yes  Risk Factor Category  - - - - High Fall Risk  Risk for fall due to : History of fall(s);Impaired balance/gait;Impaired mobility - - - -  Follow up Falls evaluation completed;Falls prevention discussed;Education provided - - - -     Vitals:   03/02/20 1038  BP: 108/64  Pulse: 84  Resp: 16  Temp: (!) 97.5 F (36.4 C)  TempSrc: Oral  Weight: 181 lb  12.8 oz (82.5 kg)  Height: 5\' 9"  (1.753 m)   Body mass index is 26.85 kg/m.  Physical Exam  GENERAL APPEARANCE: Well nourished. In no acute distress. Normal body habitus SKIN:  Skin is warm and dry.  MOUTH and THROAT: Lips are without lesions. Oral mucosa is moist and without lesions. Tongue is normal in shape, size, and color and without lesions RESPIRATORY: Breathing is even & unlabored, BS CTAB CARDIAC: RRR, no murmur,no extra heart sounds, bilateral feet trace edema GI: Abdomen soft, normal BS, no masses, no tenderness EXTREMITIES: Able to move x4 extremities NEUROLOGICAL: There is no tremor. Speech is clear.  Alert to self, disoriented days. PSYCHIATRIC:  Affect and behavior are appropriate  Labs reviewed: Recent Labs    12/02/19 0000  NA 143  K 4.3  CL 108  CO2 24*  BUN 20  CREATININE 0.9  CALCIUM 9.3   Recent Labs    12/02/19 0000  AST 21  ALT 8  ALKPHOS 68  ALBUMIN 3.5   Recent Labs    05/06/19 0000 10/19/19 0000  WBC 3.4 3.6  NEUTROABS  --  1  HGB 12.5 12.3  HCT 37 37  PLT 126*  112*   Lab Results  Component Value Date   TSH 3.39 12/02/2019   Lab Results  Component Value Date   HGBA1C 6.1 12/02/2019   Lab Results  Component Value Date   CHOL 148 12/02/2019   HDL 42 12/02/2019   LDLCALC 83 12/02/2019   TRIG 114 12/02/2019    Assessment/Plan  1. At risk for diabetic foot ulcer -  Needs diabetic shoes to prevent foot ulcers  2. Type 2 diabetes mellitus with stage 2 chronic kidney disease, without long-term current use of insulin (HCC) Lab Results  Component Value Date   HGBA1C 6.1 12/02/2019   -Diet controlled     Family/ staff Communication: Discussed plan of care with resident and charge nurse.  Labs/tests ordered: None  Goals of care:   Long-term care  12/04/2019, DNP, MSN, FNP-BC [facility] [phone] (Monday-Friday 8:00 a.m. - 5:00 p.m.) [phone] (after hours)

## 2020-03-10 ENCOUNTER — Encounter: Payer: Self-pay | Admitting: Adult Health

## 2020-03-10 ENCOUNTER — Non-Acute Institutional Stay (SKILLED_NURSING_FACILITY): Payer: Medicare Other | Admitting: Adult Health

## 2020-03-10 DIAGNOSIS — E034 Atrophy of thyroid (acquired): Secondary | ICD-10-CM | POA: Diagnosis not present

## 2020-03-10 DIAGNOSIS — G309 Alzheimer's disease, unspecified: Secondary | ICD-10-CM | POA: Diagnosis not present

## 2020-03-10 DIAGNOSIS — N182 Chronic kidney disease, stage 2 (mild): Secondary | ICD-10-CM

## 2020-03-10 DIAGNOSIS — F0281 Dementia in other diseases classified elsewhere with behavioral disturbance: Secondary | ICD-10-CM | POA: Diagnosis not present

## 2020-03-10 DIAGNOSIS — E782 Mixed hyperlipidemia: Secondary | ICD-10-CM | POA: Diagnosis not present

## 2020-03-10 DIAGNOSIS — E1122 Type 2 diabetes mellitus with diabetic chronic kidney disease: Secondary | ICD-10-CM | POA: Diagnosis not present

## 2020-03-10 DIAGNOSIS — F339 Major depressive disorder, recurrent, unspecified: Secondary | ICD-10-CM | POA: Diagnosis not present

## 2020-03-10 DIAGNOSIS — R600 Localized edema: Secondary | ICD-10-CM

## 2020-03-10 DIAGNOSIS — F39 Unspecified mood [affective] disorder: Secondary | ICD-10-CM

## 2020-03-10 NOTE — Progress Notes (Signed)
Location:  [facility] Room Number: 214-B Place of Service:  SNF (31) Provider:  Kenard Gower, DNP, FNP-BC  Patient Care Team: Pecola Lawless, MD as PCP - General (Internal Medicine)  Extended Emergency Contact Information Primary Emergency Contact: Robinson,Roberto Address: [address]          [place], [place] [address] [place] Home Phone: [phone] Relation: Son Secondary Emergency Contact: Reid,Cynthia  [place] Mobile Phone: [phone] Relation: Daughter  Code Status:  FULL CODE  Goals of care: Advanced Directive information Advanced Directives 03/02/2020  Does Patient Have a Medical Advance Directive? No  Type of Advance Directive -  Does patient want to make changes to medical advance directive? -  Copy of Healthcare Power of Attorney in Chart? -  Would patient like information on creating a medical advance directive? No - Patient declined  Pre-existing out of facility DNR order (yellow form or pink MOST form) -     Chief Complaint  Patient presents with   Medical Management of Chronic Issues    Routine [facility] visit    HPI:  Kelly [name] is an 85 y.o. female seen today for medical management of chronic diseases.  She is a long-term care resident of [facility].  She has a PMH of dementia, hypothyroidism and hypertension. She was seen in the room today. Staff reported that she gets combative during personal care change. She was recently started on Depakote 250 mg TID for mood disorder. She takes Aripiprazole 7.5 mg daily for psychosis. She has trace edema on bilateral feet and takes Furosemide 20 mg on MWF for the edema.     Past Medical History:  Diagnosis Date   Chronic kidney disease    Stage III   Dementia with behavioral disturbance (HCC) 03/19/2015   Notated on FL2 Form from Dr. Dreama Saa [phone]   Depression    previously saw Dr Donell Beers   Diabetes  mellitus without complication (HCC)    11/18/15 A1c 6%   Dyslipidemia    11/18/15 Tg 74, HDL 60, LDL 90 on statin   Hypertension    Hypothyroidism    11/18/15 TSH 53.6, 02/07/16 TSH 9.42 @ [facility]   MDD (major depressive disorder)    With behavioral disturbance   Scabies    02/21/16 [facility],[place]; Rx: Permethrin cream   Urinary tract infection due to Proteus 05/04/2019   Uniformly sensitive except to nitrofurantoin; Cipro prescribed   Vitamin D deficiency    11/18/15 vitamin D 25-hydroxy 39   Past Surgical History:  Procedure Laterality Date   no record     04/10/15 [facility] note: see old chart    No Known Allergies  Outpatient Encounter Medications as of 03/10/2020  Medication Sig   acetaminophen (TYLENOL) 325 MG tablet Take 650 mg by mouth every 6 (six) hours as needed for mild pain or moderate pain.   ARIPiprazole (ABILIFY) 5 MG tablet Take 7.5 mg by mouth daily. 1.5 tablets to = 7.5 mg   aspirin EC 81 MG tablet Take 81 mg by mouth daily.    atorvastatin (LIPITOR) 40 MG tablet Take 40 mg by mouth at bedtime.    bisacodyl (DULCOLAX) 10 MG suppository If not relieved by MOM, give 10 mg Bisacodyl suppositiory rectally X 1 dose in 24 hours as needed (Do not use constipation standing orders for residents with renal failure/CFR less than 30. Contact MD for orders) (Physician Order)   Calcium Carb-Cholecalciferol (CALCIUM-VITAMIN  D3) 600-400 MG-UNIT TABS Take 1 tablet by mouth 2 (two) times daily.   Cholecalciferol (VITAMIN D) 2000 units tablet Take 2,000 Units by mouth daily.    divalproex (DEPAKOTE SPRINKLE) 125 MG capsule Take 250 mg by mouth 3 (three) times daily.   donepezil (ARICEPT) 10 MG tablet Take 10 mg by mouth at bedtime.    furosemide (LASIX) 20 MG tablet Take 20 mg by mouth every Monday, Wednesday, and Friday.    levothyroxine (SYNTHROID) 200 MCG tablet Take 200 mcg by mouth daily before breakfast. Take with 25 mcg to = 225 mcg    levothyroxine (SYNTHROID) 25 MCG tablet Take 25 mcg by mouth daily before breakfast. Take with a 200 mcg tab to = 225 mcg   magnesium hydroxide (MILK OF MAGNESIA) 400 MG/5ML suspension If no BM in 3 days, give 30 cc Milk of Magnesium p.o. x 1 dose in 24 hours as needed (Do not use standing constipation orders for residents with renal failure CFR less than 30. Contact MD for orders) (Physician Order)   memantine (NAMENDA) 10 MG tablet Take 10 mg by mouth 2 (two) times daily.    Multiple Vitamin (MULTI-VITAMIN DAILY PO) Take one tablet by mouth once daily    Nutritional Supplement LIQD Take 120 mLs by mouth 2 (two) times daily. MedPass   PARoxetine (PAXIL) 10 MG tablet Take 15 mg by mouth at bedtime. Take 1-1/2 tablets to = 15 mg qd   sennosides-docusate sodium (SENOKOT-S) 8.6-50 MG tablet Take 2 tablets by mouth daily.    Skin Protectants, Misc. (MINERIN) CREA Apply topically to ankles twice a day for dry skin.   Sodium Phosphates (RA SALINE ENEMA RE) If not relieved by Biscodyl suppository, give disposable Saline Enema rectally X 1 dose/24 hrs as needed (Do not use constipation standing orders for residents with renal failure/CFR less than 30. Contact MD for orders)(Physician Or   No facility-administered encounter medications on file as of 03/10/2020.    Review of Systems  GENERAL: No change in appetite, no fatigue, no weight changes, no fever, chills or weakness MOUTH and THROAT: Denies oral discomfort, gingival pain or bleeding RESPIRATORY: no cough, SOB, DOE, wheezing, hemoptysis CARDIAC: No chest pain or palpitations GI: No abdominal pain, diarrhea, constipation, heart burn, nausea or vomiting NEUROLOGICAL: Denies dizziness, syncope, numbness, or headache PSYCHIATRIC: staff reported agitation during personal care  Immunization History  Administered Date(s) Administered   Influenza-Unspecified 02/17/2016, 04/15/2017, 08/01/2018, 03/18/2019   Moderna SARS-COVID-2 Vaccination  07/06/2019, 08/03/2019   PPD Test 04/08/2016   Pneumococcal Conjugate-13 01/11/2017   Pneumococcal-Unspecified 02/16/2014   Tdap 02/27/2019   Pertinent  Health Maintenance Due  Topic Date Due   INFLUENZA VACCINE  01/17/2020   FOOT EXAM  04/16/2020   HEMOGLOBIN A1C  06/02/2020   PNA vac Low Risk Adult  Completed   OPHTHALMOLOGY EXAM  Discontinued   URINE MICROALBUMIN  Discontinued   DEXA SCAN  Discontinued   Fall Risk  01/16/2019 01/14/2018 01/10/2017 08/07/2016 04/20/2016  Falls in the past year? 1 No No Yes Yes  Number falls in past yr: 1 - - 2 or more 2 or more  Injury with Fall? 0 - - - Yes  Risk Factor Category  - - - - High Fall Risk  Risk for fall due to : History of fall(s);Impaired balance/gait;Impaired mobility - - - -  Follow up Falls evaluation completed;Falls prevention discussed;Education provided - - - -     Vitals:   03/10/20 1107  BP: 124/90  Pulse: 78  Resp: 15  Temp: 97.7 F (36.5 C)  TempSrc: Oral  Weight: 181 lb 12.8 oz (82.5 kg)  Height: 5\' 9"  (1.753 m)   Body mass index is 26.85 kg/m.  Physical Exam  GENERAL APPEARANCE: Well nourished. In no acute distress. Normal body habitus SKIN:  Skin is warm and dry.  MOUTH and THROAT: Lips are without lesions. Oral mucosa is moist and without lesions. Tongue is normal in shape, size, and color and without lesions RESPIRATORY: Breathing is even & unlabored, BS CTAB CARDIAC: RRR, no murmur,no extra heart sounds, trace bilateral feet edema GI: Abdomen soft, normal BS, no masses, no tenderness EXTREMITIES:  Able to move X 4 extremities NEUROLOGICAL: There is no tremor. Speech is clear. Alert to self, disoriented to time and place. PSYCHIATRIC:  Affect and behavior are appropriate  Labs reviewed: Recent Labs    12/02/19 0000  NA 143  K 4.3  CL 108  CO2 24*  BUN 20  CREATININE 0.9  CALCIUM 9.3   Recent Labs    12/02/19 0000  AST 21  ALT 8  ALKPHOS 68  ALBUMIN 3.5   Recent Labs     05/06/19 0000 10/19/19 0000  WBC 3.4 3.6  NEUTROABS  --  1  HGB 12.5 12.3  HCT 37 37  PLT 126* 112*   Lab Results  Component Value Date   TSH 3.39 12/02/2019   Lab Results  Component Value Date   HGBA1C 6.1 12/02/2019   Lab Results  Component Value Date   CHOL 148 12/02/2019   HDL 42 12/02/2019   LDLCALC 83 12/02/2019   TRIG 114 12/02/2019    Assessment/Plan  1. Hypothyroidism due to acquired atrophy of thyroid Lab Results  Component Value Date   TSH 3.39 12/02/2019   -Continue levothyroxine  2. Type 2 diabetes mellitus with stage 2 chronic kidney disease, without long-term current use of insulin (HCC) Lab Results  Component Value Date   HGBA1C 6.1 12/02/2019   -  Diet-controlled  3. Mixed hyperlipidemia Lab Results  Component Value Date   CHOL 148 12/02/2019   HDL 42 12/02/2019   LDLCALC 83 12/02/2019   TRIG 114 12/02/2019   -  Continue lipitor  4. Mood disorder with psychosis (HCC) - continue Depakote and Abilify -  Followed by psych NP  5. Lower extremity edema -  Stable, continue Furosemide  6. Major depression, recurrent, chronic (HCC) -  Sable, continue Paroxetine  7. Alzheimer's dementia with behavioral disturbance, unspecified timing of dementia onset (HCC) -  Continue Memantine and Donepezil -  Continue supportive care    Family/ staff Communication: Discussed plan of care with resident and charge nurse.  Labs/tests ordered: None  Goals of care:   Long-term care    12/04/2019, DNP, MSN, FNP-BC [facility] [phone] (Monday-Friday 8:00 a.m. - 5:00 p.m.) [phone] (after hours)

## 2020-03-16 DIAGNOSIS — M6281 Muscle weakness (generalized): Secondary | ICD-10-CM | POA: Diagnosis not present

## 2020-03-16 DIAGNOSIS — R293 Abnormal posture: Secondary | ICD-10-CM | POA: Diagnosis not present

## 2020-03-17 DIAGNOSIS — M6281 Muscle weakness (generalized): Secondary | ICD-10-CM | POA: Diagnosis not present

## 2020-03-17 DIAGNOSIS — R293 Abnormal posture: Secondary | ICD-10-CM | POA: Diagnosis not present

## 2020-03-21 DIAGNOSIS — M6281 Muscle weakness (generalized): Secondary | ICD-10-CM | POA: Diagnosis not present

## 2020-03-21 DIAGNOSIS — R293 Abnormal posture: Secondary | ICD-10-CM | POA: Diagnosis not present

## 2020-03-22 DIAGNOSIS — R293 Abnormal posture: Secondary | ICD-10-CM | POA: Diagnosis not present

## 2020-03-22 DIAGNOSIS — M6281 Muscle weakness (generalized): Secondary | ICD-10-CM | POA: Diagnosis not present

## 2020-03-23 DIAGNOSIS — R293 Abnormal posture: Secondary | ICD-10-CM | POA: Diagnosis not present

## 2020-03-23 DIAGNOSIS — M6281 Muscle weakness (generalized): Secondary | ICD-10-CM | POA: Diagnosis not present

## 2020-03-24 DIAGNOSIS — M6281 Muscle weakness (generalized): Secondary | ICD-10-CM | POA: Diagnosis not present

## 2020-03-24 DIAGNOSIS — R293 Abnormal posture: Secondary | ICD-10-CM | POA: Diagnosis not present

## 2020-03-25 DIAGNOSIS — R293 Abnormal posture: Secondary | ICD-10-CM | POA: Diagnosis not present

## 2020-03-25 DIAGNOSIS — M6281 Muscle weakness (generalized): Secondary | ICD-10-CM | POA: Diagnosis not present

## 2020-03-28 DIAGNOSIS — R293 Abnormal posture: Secondary | ICD-10-CM | POA: Diagnosis not present

## 2020-03-28 DIAGNOSIS — M6281 Muscle weakness (generalized): Secondary | ICD-10-CM | POA: Diagnosis not present

## 2020-03-29 DIAGNOSIS — R293 Abnormal posture: Secondary | ICD-10-CM | POA: Diagnosis not present

## 2020-03-29 DIAGNOSIS — M6281 Muscle weakness (generalized): Secondary | ICD-10-CM | POA: Diagnosis not present

## 2020-03-30 DIAGNOSIS — R293 Abnormal posture: Secondary | ICD-10-CM | POA: Diagnosis not present

## 2020-03-30 DIAGNOSIS — M6281 Muscle weakness (generalized): Secondary | ICD-10-CM | POA: Diagnosis not present

## 2020-03-31 DIAGNOSIS — R293 Abnormal posture: Secondary | ICD-10-CM | POA: Diagnosis not present

## 2020-03-31 DIAGNOSIS — M6281 Muscle weakness (generalized): Secondary | ICD-10-CM | POA: Diagnosis not present

## 2020-04-01 DIAGNOSIS — R293 Abnormal posture: Secondary | ICD-10-CM | POA: Diagnosis not present

## 2020-04-01 DIAGNOSIS — M6281 Muscle weakness (generalized): Secondary | ICD-10-CM | POA: Diagnosis not present

## 2020-04-04 DIAGNOSIS — M6281 Muscle weakness (generalized): Secondary | ICD-10-CM | POA: Diagnosis not present

## 2020-04-04 DIAGNOSIS — R293 Abnormal posture: Secondary | ICD-10-CM | POA: Diagnosis not present

## 2020-04-05 ENCOUNTER — Encounter: Payer: Self-pay | Admitting: Adult Health

## 2020-04-05 ENCOUNTER — Non-Acute Institutional Stay (SKILLED_NURSING_FACILITY): Payer: Medicare Other | Admitting: Adult Health

## 2020-04-05 DIAGNOSIS — R600 Localized edema: Secondary | ICD-10-CM

## 2020-04-05 DIAGNOSIS — F39 Unspecified mood [affective] disorder: Secondary | ICD-10-CM

## 2020-04-05 DIAGNOSIS — F339 Major depressive disorder, recurrent, unspecified: Secondary | ICD-10-CM

## 2020-04-05 DIAGNOSIS — E782 Mixed hyperlipidemia: Secondary | ICD-10-CM

## 2020-04-05 DIAGNOSIS — E034 Atrophy of thyroid (acquired): Secondary | ICD-10-CM | POA: Diagnosis not present

## 2020-04-05 DIAGNOSIS — F0281 Dementia in other diseases classified elsewhere with behavioral disturbance: Secondary | ICD-10-CM | POA: Diagnosis not present

## 2020-04-05 DIAGNOSIS — G309 Alzheimer's disease, unspecified: Secondary | ICD-10-CM | POA: Diagnosis not present

## 2020-04-05 NOTE — Progress Notes (Signed)
Location:  [facility] Room Number: 214-B Place of Service:  SNF (31) Provider:  Kenard Gower, DNP, FNP-BC  Patient Care Team: Pecola Lawless, MD as PCP - General (Internal Medicine)  Extended Emergency Contact Information Primary Emergency Contact: Robinson,Roberto Address: [address]          [place], [place] [address] [place] Home Phone: [phone] Relation: Son Secondary Emergency Contact: Reid,Cynthia  [place] Mobile Phone: [phone] Relation: Daughter  Code Status:  FULL CODE  Goals of care: Advanced Directive information Advanced Directives 03/02/2020  Does Patient Have a Medical Advance Directive? No  Type of Advance Directive -  Does patient want to make changes to medical advance directive? -  Copy of Healthcare Power of Attorney in Chart? -  Would patient like information on creating a medical advance directive? No - Patient declined  Pre-existing out of facility DNR order (yellow form or pink MOST form) -     Chief Complaint  Patient presents with  . Medical Management of Chronic Issues    Routine [facility] visit    HPI:  Kelly [name] is an 85 y.o. female seen today for medical management of chronic diseases.  She is a long-term care resident of [facility].  She has a PMH of dementia, hypothyroidism and hypertension. She is currently having restorative programs such as 1) eating/swallowing and 2) transporters from wheelchair to parallel bars. Staff reported resident to have agitation episodes during ADL care. She is currently taking divalproex DR 125 mg 2 capsules = 250 mg 3 times daily for mood disorder and aripiprazole 5 mg takes 1.5 tab = 7.5 mg daily for psychosis.   Past Medical History:  Diagnosis Date  . Chronic kidney disease    Stage III  . Dementia with behavioral disturbance (HCC) 03/19/2015   Notated on FL2 Form from Dr. Dreama Saa [phone]  .  Depression    previously saw Dr Donell Beers  . Diabetes mellitus without complication (HCC)    11/18/15 A1c 6%  . Dyslipidemia    11/18/15 Tg 74, HDL 60, LDL 90 on statin  . Hypertension   . Hypothyroidism    11/18/15 TSH 53.6, 02/07/16 TSH 9.42 @ [facility]  . MDD (major depressive disorder)    With behavioral disturbance  . Scabies    02/21/16 [facility],[place]; Rx: Permethrin cream  . Urinary tract infection due to Proteus 05/04/2019   Uniformly sensitive except to nitrofurantoin; Cipro prescribed  . Vitamin D deficiency    11/18/15 vitamin D 25-hydroxy 39   Past Surgical History:  Procedure Laterality Date  . no record     04/10/15 [facility] note: see old chart    No Known Allergies  Outpatient Encounter Medications as of 04/05/2020  Medication Sig  . acetaminophen (TYLENOL) 325 MG tablet Take 650 mg by mouth every 6 (six) hours as needed for mild pain or moderate pain.  . ARIPiprazole (ABILIFY) 5 MG tablet Take 7.5 mg by mouth daily. 1.5 tablets to = 7.5 mg  . aspirin EC 81 MG tablet Take 81 mg by mouth daily.   Marland Kitchen atorvastatin (LIPITOR) 40 MG tablet Take 40 mg by mouth at bedtime.   . bisacodyl (DULCOLAX) 10 MG suppository If not relieved by MOM, give 10 mg Bisacodyl suppositiory rectally X 1 dose in 24 hours as needed (Do not use constipation standing orders for residents with renal failure/CFR less than 30. Contact MD for orders) (Physician Order)  .  Calcium Carb-Cholecalciferol (CALCIUM-VITAMIN D3) 600-400 MG-UNIT TABS Take 1 tablet by mouth 2 (two) times daily.  . Cholecalciferol (VITAMIN D) 2000 units tablet Take 2,000 Units by mouth daily.   . divalproex (DEPAKOTE SPRINKLE) 125 MG capsule Take 250 mg by mouth 3 (three) times daily.  Marland Kitchen donepezil (ARICEPT) 10 MG tablet Take 10 mg by mouth at bedtime.   . furosemide (LASIX) 20 MG tablet Take 20 mg by mouth every Monday, Wednesday, and Friday.   . levothyroxine (SYNTHROID) 200 MCG tablet Take 200 mcg by mouth daily before  breakfast. Take with 25 mcg to = 225 mcg  . levothyroxine (SYNTHROID) 25 MCG tablet Take 25 mcg by mouth daily before breakfast. Take with a 200 mcg tab to = 225 mcg  . magnesium hydroxide (MILK OF MAGNESIA) 400 MG/5ML suspension If no BM in 3 days, give 30 cc Milk of Magnesium p.o. x 1 dose in 24 hours as needed (Do not use standing constipation orders for residents with renal failure CFR less than 30. Contact MD for orders) (Physician Order)  . memantine (NAMENDA) 10 MG tablet Take 10 mg by mouth 2 (two) times daily.   . Multiple Vitamin (MULTI-VITAMIN DAILY PO) Take one tablet by mouth once daily   . Nutritional Supplement LIQD Take 120 mLs by mouth 2 (two) times daily. MedPass  . Nutritional Supplements (NUTRITIONAL SUPPLEMENT PO) Take 1 each by mouth daily. Magic Cup to help stabilize weight  . PARoxetine (PAXIL) 10 MG tablet Take 15 mg by mouth at bedtime. Take 1-1/2 tablets to = 15 mg qd  . sennosides-docusate sodium (SENOKOT-S) 8.6-50 MG tablet Take 2 tablets by mouth daily.   . Skin Protectants, Misc. (MINERIN) CREA Apply topically to ankles twice a day for dry skin.  . Sodium Phosphates (RA SALINE ENEMA RE) If not relieved by Biscodyl suppository, give disposable Saline Enema rectally X 1 dose/24 hrs as needed (Do not use constipation standing orders for residents with renal failure/CFR less than 30. Contact MD for orders)(Physician Or   No facility-administered encounter medications on file as of 04/05/2020.    Review of Systems  GENERAL: No change in appetite, no fatigue, no weight changes, no fever, chills or weakness MOUTH and THROAT: Denies oral discomfort, gingival pain or bleeding RESPIRATORY: no cough, SOB, DOE, wheezing, hemoptysis CARDIAC: No chest pain, edema or palpitations GI: No abdominal pain, diarrhea, constipation, heart burn, nausea or vomiting GU: Denies dysuria, frequency, hematuria or discharge NEUROLOGICAL: Denies dizziness, syncope, numbness, or  headache PSYCHIATRIC: +agitation   Immunization History  Administered Date(s) Administered  . Influenza-Unspecified 02/17/2016, 04/15/2017, 08/01/2018, 03/18/2019  . Moderna SARS-COVID-2 Vaccination 07/06/2019, 08/03/2019  . PPD Test 04/08/2016  . Pneumococcal Conjugate-13 01/11/2017  . Pneumococcal-Unspecified 02/16/2014  . Tdap 02/27/2019   Pertinent  Health Maintenance Due  Topic Date Due  . INFLUENZA VACCINE  09/15/2020 (Originally 01/17/2020)  . FOOT EXAM  04/16/2020  . HEMOGLOBIN A1C  06/02/2020  . PNA vac Low Risk Adult  Completed  . OPHTHALMOLOGY EXAM  Discontinued  . URINE MICROALBUMIN  Discontinued  . DEXA SCAN  Discontinued   Fall Risk  01/16/2019 01/14/2018 01/10/2017 08/07/2016 04/20/2016  Falls in the past year? 1 No No Yes Yes  Number falls in past yr: 1 - - 2 or more 2 or more  Injury with Fall? 0 - - - Yes  Risk Factor Category  - - - - High Fall Risk  Risk for fall due to : History of fall(s);Impaired balance/gait;Impaired mobility - - - -  Follow up Falls evaluation completed;Falls prevention discussed;Education provided - - - -     Vitals:   04/05/20 0900  BP: 125/66  Pulse: (!) 57  Resp: 18  Temp: 97.7 F (36.5 C)  TempSrc: Oral  Weight: 172 lb 12.8 oz (78.4 kg)  Height: 5\' 9"  (1.753 m)   Body mass index is 25.52 kg/m.  Physical Exam  GENERAL APPEARANCE: Well nourished. In no acute distress. Normal body habitus SKIN:  Skin is warm and dry.  MOUTH and THROAT: Lips are without lesions. Oral mucosa is moist and without lesions. Tongue is normal in shape, size, and color and without lesions RESPIRATORY: Breathing is even & unlabored, BS CTAB CARDIAC: RRR, no murmur,no extra heart sounds GI: Abdomen soft, normal BS, no masses, no tenderness EXTREMITIES:  Able to move X 4 extremities NEUROLOGICAL: There is no tremor. Speech is clear. Alert to self, disoriented to time and place. PSYCHIATRIC:  Affect and behavior are appropriate  Labs  reviewed: Recent Labs    12/02/19 0000  NA 143  K 4.3  CL 108  CO2 24*  BUN 20  CREATININE 0.9  CALCIUM 9.3   Recent Labs    12/02/19 0000  AST 21  ALT 8  ALKPHOS 68  ALBUMIN 3.5   Recent Labs    05/06/19 0000 10/19/19 0000  WBC 3.4 3.6  NEUTROABS  --  1  HGB 12.5 12.3  HCT 37 37  PLT 126* 112*   Lab Results  Component Value Date   TSH 3.39 12/02/2019   Lab Results  Component Value Date   HGBA1C 6.1 12/02/2019   Lab Results  Component Value Date   CHOL 148 12/02/2019   HDL 42 12/02/2019   LDLCALC 83 12/02/2019   TRIG 114 12/02/2019    Assessment/Plan  1. Lower extremity edema - no edema noted, continue furosemide  2. Hypothyroidism due to acquired atrophy of thyroid Lab Results  Component Value Date   TSH 3.39 12/02/2019   -Continue levothyroxine  3. Mixed hyperlipidemia Lab Results  Component Value Date   CHOL 148 12/02/2019   HDL 42 12/02/2019   LDLCALC 83 12/02/2019   TRIG 114 12/02/2019   -Continue Lipitor  4. Mood disorder with psychosis (HCC) -  Has episodes of agitation, continue divalproex and aripiprazole  5. Major depression, recurrent, chronic (HCC) -Continue paroxetine -Followed by psych NP  6. Alzheimer's dementia with behavioral disturbance, unspecified timing of dementia onset (HCC) -Continue memantine and donepezil -Continue supportive care and fall precautions    Family/ staff Communication: Discussed plan of care with resident and charge nurse.  Labs/tests ordered: None  Goals of care:   Long-term care   12/04/2019, DNP, MSN, FNP-BC [facility] [phone] (Monday-Friday 8:00 a.m. - 5:00 p.m.) [phone] (after hours)

## 2020-04-07 DIAGNOSIS — Z23 Encounter for immunization: Secondary | ICD-10-CM | POA: Diagnosis not present

## 2020-05-03 ENCOUNTER — Encounter: Payer: Self-pay | Admitting: Adult Health

## 2020-05-03 ENCOUNTER — Non-Acute Institutional Stay (SKILLED_NURSING_FACILITY): Payer: Medicare Other | Admitting: Adult Health

## 2020-05-03 DIAGNOSIS — F339 Major depressive disorder, recurrent, unspecified: Secondary | ICD-10-CM

## 2020-05-03 DIAGNOSIS — R600 Localized edema: Secondary | ICD-10-CM | POA: Diagnosis not present

## 2020-05-03 DIAGNOSIS — F39 Unspecified mood [affective] disorder: Secondary | ICD-10-CM | POA: Diagnosis not present

## 2020-05-03 DIAGNOSIS — G309 Alzheimer's disease, unspecified: Secondary | ICD-10-CM

## 2020-05-03 DIAGNOSIS — E034 Atrophy of thyroid (acquired): Secondary | ICD-10-CM

## 2020-05-03 DIAGNOSIS — N182 Chronic kidney disease, stage 2 (mild): Secondary | ICD-10-CM

## 2020-05-03 DIAGNOSIS — F0281 Dementia in other diseases classified elsewhere with behavioral disturbance: Secondary | ICD-10-CM

## 2020-05-03 DIAGNOSIS — E1122 Type 2 diabetes mellitus with diabetic chronic kidney disease: Secondary | ICD-10-CM | POA: Diagnosis not present

## 2020-05-03 NOTE — Progress Notes (Signed)
Location:  [facility] Room Number: 214-B Place of Service:  SNF (31) Provider:  Kenard Gower, DNP, FNP-BC  Patient Care Team: Pecola Lawless, MD as PCP - General (Internal Medicine)  Extended Emergency Contact Information Primary Emergency Contact: Robinson,Roberto Address: [address]          [place], [place] [address] [place] Home Phone: [phone] Relation: Son Secondary Emergency Contact: Reid,Cynthia  [place] Mobile Phone: [phone] Relation: Daughter  Code Status:  FULL CODE  Goals of care: Advanced Directive information Advanced Directives 04/05/2020  Does Patient Have a Medical Advance Directive? No  Type of Advance Directive -  Does patient want to make changes to medical advance directive? -  Copy of Healthcare Power of Attorney in Chart? -  Would patient like information on creating a medical advance directive? No - Patient declined  Pre-existing out of facility DNR order (yellow form or pink MOST form) -     Chief Complaint  Patient presents with  . Medical Management of Chronic Issues    Routine [facility] visit  . Quality Metric Gaps    Diabetic foot exam, was previously ordered 11/2018, but has not been performed    HPI:  Pt is an 85 y.o. female seen today for medical management of chronic diseases.  She is a long-term care resident of [facility].  She has a PMH of dementia, hypothyroidism hypertension.She currently participates in restorative program such as 1) transfers, and 2) eating/swallowing. Staff reported that she has episodes of agitation/scratching  Staff during her ADL care. She takes Divalproex 250 mg TID for mood disorder and Aripiprazole 7.5 mg daily for psychosis. Latest hgbA1c 6.1 (12/02/19). She is not taking any medication for diabetes mellitus.   Past Medical History:  Diagnosis Date  . Chronic kidney disease    Stage III  . Dementia  with behavioral disturbance (HCC) 03/19/2015   Notated on FL2 Form from Dr. Dreama Saa [phone]  . Depression    previously saw Dr Donell Beers  . Diabetes mellitus without complication (HCC)    11/18/15 A1c 6%  . Dyslipidemia    11/18/15 Tg 74, HDL 60, LDL 90 on statin  . Hypertension   . Hypothyroidism    11/18/15 TSH 53.6, 02/07/16 TSH 9.42 @ [facility]  . MDD (major depressive disorder)    With behavioral disturbance  . Scabies    02/21/16 [facility],[place]; Rx: Permethrin cream  . Urinary tract infection due to Proteus 05/04/2019   Uniformly sensitive except to nitrofurantoin; Cipro prescribed  . Vitamin D deficiency    11/18/15 vitamin D 25-hydroxy 39   Past Surgical History:  Procedure Laterality Date  . no record     04/10/15 [facility] note: see old chart    No Known Allergies  Outpatient Encounter Medications as of 05/03/2020  Medication Sig  . acetaminophen (TYLENOL) 325 MG tablet Take 650 mg by mouth every 6 (six) hours as needed for mild pain or moderate pain.  . ARIPiprazole (ABILIFY) 5 MG tablet Take 7.5 mg by mouth daily. 1.5 tablets to = 7.5 mg  . aspirin EC 81 MG tablet Take 81 mg by mouth daily.   Marland Kitchen atorvastatin (LIPITOR) 40 MG tablet Take 40 mg by mouth at bedtime.   . bisacodyl (DULCOLAX) 10 MG suppository If not relieved by MOM, give 10 mg Bisacodyl suppositiory rectally X 1 dose in 24 hours as needed (Do not use constipation standing orders for  residents with renal failure/CFR less than 30. Contact MD for orders) (Physician Order)  . Calcium Carb-Cholecalciferol (CALCIUM-VITAMIN D3) 600-400 MG-UNIT TABS Take 1 tablet by mouth 2 (two) times daily.  . Cholecalciferol (VITAMIN D) 2000 units tablet Take 2,000 Units by mouth daily.   . divalproex (DEPAKOTE SPRINKLE) 125 MG capsule Take 250 mg by mouth 3 (three) times daily.  Marland Kitchen donepezil (ARICEPT) 10 MG tablet Take 10 mg by mouth at bedtime.   . furosemide (LASIX) 20 MG tablet Take 20 mg by mouth every Monday,  Wednesday, and Friday.   . levothyroxine (SYNTHROID) 200 MCG tablet Take 200 mcg by mouth daily before breakfast. Take with 25 mcg to = 225 mcg  . levothyroxine (SYNTHROID) 25 MCG tablet Take 25 mcg by mouth daily before breakfast. Take with a 200 mcg tab to = 225 mcg  . magnesium hydroxide (MILK OF MAGNESIA) 400 MG/5ML suspension If no BM in 3 days, give 30 cc Milk of Magnesium p.o. x 1 dose in 24 hours as needed (Do not use standing constipation orders for residents with renal failure CFR less than 30. Contact MD for orders) (Physician Order)  . memantine (NAMENDA) 10 MG tablet Take 10 mg by mouth 2 (two) times daily.   . Nutritional Supplement LIQD Take 120 mLs by mouth 2 (two) times daily. MedPass  . Nutritional Supplements (NUTRITIONAL SUPPLEMENT PO) Take 1 each by mouth daily. Magic Cup to help stabilize weight  . PARoxetine (PAXIL) 10 MG tablet Take 15 mg by mouth at bedtime. Take 1-1/2 tablets to = 15 mg qd  . sennosides-docusate sodium (SENOKOT-S) 8.6-50 MG tablet Take 2 tablets by mouth daily.   . Skin Protectants, Misc. (MINERIN) CREA Apply topically to ankles twice a day for dry skin.  . Sodium Phosphates (RA SALINE ENEMA RE) If not relieved by Biscodyl suppository, give disposable Saline Enema rectally X 1 dose/24 hrs as needed (Do not use constipation standing orders for residents with renal failure/CFR less than 30. Contact MD for orders)(Physician Or  . Multiple Vitamin (MULTI-VITAMIN DAILY PO) Take one tablet by mouth once daily    No facility-administered encounter medications on file as of 05/03/2020.    Review of Systems  GENERAL: No change in appetite, no fatigue, no weight changes, no fever, chills or weakness MOUTH and THROAT: Denies oral discomfort, gingival pain or bleeding, pain from teeth or hoarseness   RESPIRATORY: no cough, SOB, DOE, wheezing, hemoptysis CARDIAC: No chest pain or palpitations GI: No abdominal pain, diarrhea, constipation, heart burn, nausea or  vomiting GU: Denies dysuria, frequency, hematuria or discharge NEUROLOGICAL: Denies dizziness, syncope, numbness, or headache PSYCHIATRIC: +agitation   Immunization History  Administered Date(s) Administered  . Influenza-Unspecified 02/17/2016, 08/01/2018, 03/18/2019, 04/07/2020  . Moderna SARS-COVID-2 Vaccination 07/06/2019, 08/03/2019  . PPD Test 04/08/2016  . Pneumococcal Conjugate-13 01/11/2017  . Pneumococcal-Unspecified 02/16/2014  . Tdap 02/27/2019   Pertinent  Health Maintenance Due  Topic Date Due  . FOOT EXAM  04/16/2020  . HEMOGLOBIN A1C  06/02/2020  . INFLUENZA VACCINE  Completed  . PNA vac Low Risk Adult  Completed  . OPHTHALMOLOGY EXAM  Discontinued  . URINE MICROALBUMIN  Discontinued  . DEXA SCAN  Discontinued   Fall Risk  01/16/2019 01/14/2018 01/10/2017 08/07/2016 04/20/2016  Falls in the past year? 1 No No Yes Yes  Number falls in past yr: 1 - - 2 or more 2 or more  Injury with Fall? 0 - - - Yes  Risk Factor Category  - - - -  High Fall Risk  Risk for fall due to : History of fall(s);Impaired balance/gait;Impaired mobility - - - -  Follow up Falls evaluation completed;Falls prevention discussed;Education provided - - - -     Vitals:   05/03/20 1628  BP: (!) 124/57  Pulse: 64  Resp: 18  Temp: 97.6 F (36.4 C)  TempSrc: Oral  Weight: 171 lb 12.8 oz (77.9 kg)  Height: 5\' 9"  (1.753 m)   Body mass index is 25.37 kg/m.  Physical Exam  GENERAL APPEARANCE: Well nourished. In no acute distress. Normal body habitus SKIN:  Skin is warm and dry.  MOUTH and THROAT: Lips are without lesions. Oral mucosa is moist and without lesions. Tongue is normal in shape, size, and color and without lesions RESPIRATORY: Breathing is even & unlabored, BS CTAB CARDIAC: RRR, no murmur,no extra heart sounds, bilateral feet with trace edema GI: Abdomen soft, normal BS, no masses, no tenderness EXTREMITIES:  Able to move X 4 extremities NEUROLOGICAL: There is no tremor. Speech is  clear. Alert to self, disoriented to time and place. PSYCHIATRIC:  Affect and behavior are appropriate  Labs reviewed: Recent Labs    12/02/19 0000  NA 143  K 4.3  CL 108  CO2 24*  BUN 20  CREATININE 0.9  CALCIUM 9.3   Recent Labs    12/02/19 0000  AST 21  ALT 8  ALKPHOS 68  ALBUMIN 3.5   Recent Labs    05/06/19 0000 10/19/19 0000  WBC 3.4 3.6  NEUTROABS  --  1  HGB 12.5 12.3  HCT 37 37  PLT 126* 112*   Lab Results  Component Value Date   TSH 3.39 12/02/2019   Lab Results  Component Value Date   HGBA1C 6.1 12/02/2019   Lab Results  Component Value Date   CHOL 148 12/02/2019   HDL 42 12/02/2019   LDLCALC 83 12/02/2019   TRIG 114 12/02/2019    Assessment/Plan  1. Type 2 diabetes mellitus with stage 2 chronic kidney disease, without long-term current use of insulin (HCC) Lab Results  Component Value Date   HGBA1C 6.1 12/02/2019   -  Diet-controlled  2. Hypothyroidism due to acquired atrophy of thyroid Lab Results  Component Value Date   TSH 3.39 12/02/2019   -Continue levothyroxine  3. Lower extremity edema -Stable, continue furosemide  4. Mood disorder with psychosis (HCC) -Continue divalproex DR and aripiprazole -  5. Major depression, recurrent, chronic (HCC) -Continue paroxetine and aripiprazole -Followed by psych NP  6. Alzheimer's dementia with behavioral disturbance, unspecified timing of dementia onset (HCC) -Continue donepezil and memantine -Continue supportive care   Family/ staff Communication: Discussed plan of care with resident and charge nurse.  Labs/tests ordered:  None  Goals of care:   Long-term care   12/04/2019, DNP, MSN, FNP-BC [facility] [phone] (Monday-Friday 8:00 a.m. - 5:00 p.m.) [phone] (after hours)

## 2020-05-04 DIAGNOSIS — F22 Delusional disorders: Secondary | ICD-10-CM | POA: Diagnosis not present

## 2020-05-04 DIAGNOSIS — F0390 Unspecified dementia without behavioral disturbance: Secondary | ICD-10-CM | POA: Diagnosis not present

## 2020-05-04 DIAGNOSIS — F39 Unspecified mood [affective] disorder: Secondary | ICD-10-CM | POA: Diagnosis not present

## 2020-05-04 DIAGNOSIS — F338 Other recurrent depressive disorders: Secondary | ICD-10-CM | POA: Diagnosis not present

## 2020-05-05 DIAGNOSIS — F0280 Dementia in other diseases classified elsewhere without behavioral disturbance: Secondary | ICD-10-CM | POA: Diagnosis not present

## 2020-05-05 DIAGNOSIS — F0391 Unspecified dementia with behavioral disturbance: Secondary | ICD-10-CM | POA: Diagnosis not present

## 2020-05-05 DIAGNOSIS — M6281 Muscle weakness (generalized): Secondary | ICD-10-CM | POA: Diagnosis not present

## 2020-05-05 DIAGNOSIS — R1311 Dysphagia, oral phase: Secondary | ICD-10-CM | POA: Diagnosis not present

## 2020-05-05 DIAGNOSIS — I129 Hypertensive chronic kidney disease with stage 1 through stage 4 chronic kidney disease, or unspecified chronic kidney disease: Secondary | ICD-10-CM | POA: Diagnosis not present

## 2020-05-06 DIAGNOSIS — I129 Hypertensive chronic kidney disease with stage 1 through stage 4 chronic kidney disease, or unspecified chronic kidney disease: Secondary | ICD-10-CM | POA: Diagnosis not present

## 2020-05-06 DIAGNOSIS — R1311 Dysphagia, oral phase: Secondary | ICD-10-CM | POA: Diagnosis not present

## 2020-05-06 DIAGNOSIS — F0391 Unspecified dementia with behavioral disturbance: Secondary | ICD-10-CM | POA: Diagnosis not present

## 2020-05-06 DIAGNOSIS — M6281 Muscle weakness (generalized): Secondary | ICD-10-CM | POA: Diagnosis not present

## 2020-05-06 DIAGNOSIS — F0280 Dementia in other diseases classified elsewhere without behavioral disturbance: Secondary | ICD-10-CM | POA: Diagnosis not present

## 2020-05-09 DIAGNOSIS — R1311 Dysphagia, oral phase: Secondary | ICD-10-CM | POA: Diagnosis not present

## 2020-05-09 DIAGNOSIS — M6281 Muscle weakness (generalized): Secondary | ICD-10-CM | POA: Diagnosis not present

## 2020-05-09 DIAGNOSIS — I129 Hypertensive chronic kidney disease with stage 1 through stage 4 chronic kidney disease, or unspecified chronic kidney disease: Secondary | ICD-10-CM | POA: Diagnosis not present

## 2020-05-09 DIAGNOSIS — F0391 Unspecified dementia with behavioral disturbance: Secondary | ICD-10-CM | POA: Diagnosis not present

## 2020-05-09 DIAGNOSIS — F0280 Dementia in other diseases classified elsewhere without behavioral disturbance: Secondary | ICD-10-CM | POA: Diagnosis not present

## 2020-05-10 DIAGNOSIS — I129 Hypertensive chronic kidney disease with stage 1 through stage 4 chronic kidney disease, or unspecified chronic kidney disease: Secondary | ICD-10-CM | POA: Diagnosis not present

## 2020-05-10 DIAGNOSIS — F0280 Dementia in other diseases classified elsewhere without behavioral disturbance: Secondary | ICD-10-CM | POA: Diagnosis not present

## 2020-05-10 DIAGNOSIS — F0391 Unspecified dementia with behavioral disturbance: Secondary | ICD-10-CM | POA: Diagnosis not present

## 2020-05-10 DIAGNOSIS — M6281 Muscle weakness (generalized): Secondary | ICD-10-CM | POA: Diagnosis not present

## 2020-05-10 DIAGNOSIS — R1311 Dysphagia, oral phase: Secondary | ICD-10-CM | POA: Diagnosis not present

## 2020-05-11 DIAGNOSIS — F0280 Dementia in other diseases classified elsewhere without behavioral disturbance: Secondary | ICD-10-CM | POA: Diagnosis not present

## 2020-05-11 DIAGNOSIS — I129 Hypertensive chronic kidney disease with stage 1 through stage 4 chronic kidney disease, or unspecified chronic kidney disease: Secondary | ICD-10-CM | POA: Diagnosis not present

## 2020-05-11 DIAGNOSIS — F0391 Unspecified dementia with behavioral disturbance: Secondary | ICD-10-CM | POA: Diagnosis not present

## 2020-05-11 DIAGNOSIS — R1311 Dysphagia, oral phase: Secondary | ICD-10-CM | POA: Diagnosis not present

## 2020-05-11 DIAGNOSIS — M6281 Muscle weakness (generalized): Secondary | ICD-10-CM | POA: Diagnosis not present

## 2020-05-12 DIAGNOSIS — M6281 Muscle weakness (generalized): Secondary | ICD-10-CM | POA: Diagnosis not present

## 2020-05-12 DIAGNOSIS — F0391 Unspecified dementia with behavioral disturbance: Secondary | ICD-10-CM | POA: Diagnosis not present

## 2020-05-12 DIAGNOSIS — I129 Hypertensive chronic kidney disease with stage 1 through stage 4 chronic kidney disease, or unspecified chronic kidney disease: Secondary | ICD-10-CM | POA: Diagnosis not present

## 2020-05-12 DIAGNOSIS — R1311 Dysphagia, oral phase: Secondary | ICD-10-CM | POA: Diagnosis not present

## 2020-05-12 DIAGNOSIS — F0280 Dementia in other diseases classified elsewhere without behavioral disturbance: Secondary | ICD-10-CM | POA: Diagnosis not present

## 2020-05-13 DIAGNOSIS — I129 Hypertensive chronic kidney disease with stage 1 through stage 4 chronic kidney disease, or unspecified chronic kidney disease: Secondary | ICD-10-CM | POA: Diagnosis not present

## 2020-05-13 DIAGNOSIS — M6281 Muscle weakness (generalized): Secondary | ICD-10-CM | POA: Diagnosis not present

## 2020-05-13 DIAGNOSIS — R1311 Dysphagia, oral phase: Secondary | ICD-10-CM | POA: Diagnosis not present

## 2020-05-13 DIAGNOSIS — F0391 Unspecified dementia with behavioral disturbance: Secondary | ICD-10-CM | POA: Diagnosis not present

## 2020-05-13 DIAGNOSIS — F0280 Dementia in other diseases classified elsewhere without behavioral disturbance: Secondary | ICD-10-CM | POA: Diagnosis not present

## 2020-05-14 DIAGNOSIS — F0280 Dementia in other diseases classified elsewhere without behavioral disturbance: Secondary | ICD-10-CM | POA: Diagnosis not present

## 2020-05-14 DIAGNOSIS — I129 Hypertensive chronic kidney disease with stage 1 through stage 4 chronic kidney disease, or unspecified chronic kidney disease: Secondary | ICD-10-CM | POA: Diagnosis not present

## 2020-05-14 DIAGNOSIS — F0391 Unspecified dementia with behavioral disturbance: Secondary | ICD-10-CM | POA: Diagnosis not present

## 2020-05-14 DIAGNOSIS — M6281 Muscle weakness (generalized): Secondary | ICD-10-CM | POA: Diagnosis not present

## 2020-05-14 DIAGNOSIS — R1311 Dysphagia, oral phase: Secondary | ICD-10-CM | POA: Diagnosis not present

## 2020-05-16 DIAGNOSIS — I129 Hypertensive chronic kidney disease with stage 1 through stage 4 chronic kidney disease, or unspecified chronic kidney disease: Secondary | ICD-10-CM | POA: Diagnosis not present

## 2020-05-16 DIAGNOSIS — M6281 Muscle weakness (generalized): Secondary | ICD-10-CM | POA: Diagnosis not present

## 2020-05-16 DIAGNOSIS — R1311 Dysphagia, oral phase: Secondary | ICD-10-CM | POA: Diagnosis not present

## 2020-05-16 DIAGNOSIS — F0391 Unspecified dementia with behavioral disturbance: Secondary | ICD-10-CM | POA: Diagnosis not present

## 2020-05-16 DIAGNOSIS — F0280 Dementia in other diseases classified elsewhere without behavioral disturbance: Secondary | ICD-10-CM | POA: Diagnosis not present

## 2020-05-17 DIAGNOSIS — F0391 Unspecified dementia with behavioral disturbance: Secondary | ICD-10-CM | POA: Diagnosis not present

## 2020-05-17 DIAGNOSIS — I129 Hypertensive chronic kidney disease with stage 1 through stage 4 chronic kidney disease, or unspecified chronic kidney disease: Secondary | ICD-10-CM | POA: Diagnosis not present

## 2020-05-17 DIAGNOSIS — M6281 Muscle weakness (generalized): Secondary | ICD-10-CM | POA: Diagnosis not present

## 2020-05-17 DIAGNOSIS — R1311 Dysphagia, oral phase: Secondary | ICD-10-CM | POA: Diagnosis not present

## 2020-05-17 DIAGNOSIS — F0280 Dementia in other diseases classified elsewhere without behavioral disturbance: Secondary | ICD-10-CM | POA: Diagnosis not present

## 2020-05-18 DIAGNOSIS — M6281 Muscle weakness (generalized): Secondary | ICD-10-CM | POA: Diagnosis not present

## 2020-05-18 DIAGNOSIS — F0280 Dementia in other diseases classified elsewhere without behavioral disturbance: Secondary | ICD-10-CM | POA: Diagnosis not present

## 2020-05-18 DIAGNOSIS — I129 Hypertensive chronic kidney disease with stage 1 through stage 4 chronic kidney disease, or unspecified chronic kidney disease: Secondary | ICD-10-CM | POA: Diagnosis not present

## 2020-05-19 DIAGNOSIS — F0280 Dementia in other diseases classified elsewhere without behavioral disturbance: Secondary | ICD-10-CM | POA: Diagnosis not present

## 2020-05-19 DIAGNOSIS — M6281 Muscle weakness (generalized): Secondary | ICD-10-CM | POA: Diagnosis not present

## 2020-05-19 DIAGNOSIS — I129 Hypertensive chronic kidney disease with stage 1 through stage 4 chronic kidney disease, or unspecified chronic kidney disease: Secondary | ICD-10-CM | POA: Diagnosis not present

## 2020-05-20 DIAGNOSIS — J069 Acute upper respiratory infection, unspecified: Secondary | ICD-10-CM | POA: Diagnosis not present

## 2020-05-20 DIAGNOSIS — Z20818 Contact with and (suspected) exposure to other bacterial communicable diseases: Secondary | ICD-10-CM | POA: Diagnosis not present

## 2020-05-20 DIAGNOSIS — F0280 Dementia in other diseases classified elsewhere without behavioral disturbance: Secondary | ICD-10-CM | POA: Diagnosis not present

## 2020-05-20 DIAGNOSIS — I129 Hypertensive chronic kidney disease with stage 1 through stage 4 chronic kidney disease, or unspecified chronic kidney disease: Secondary | ICD-10-CM | POA: Diagnosis not present

## 2020-05-20 DIAGNOSIS — M6281 Muscle weakness (generalized): Secondary | ICD-10-CM | POA: Diagnosis not present

## 2020-05-21 DIAGNOSIS — U071 COVID-19: Secondary | ICD-10-CM | POA: Diagnosis not present

## 2020-05-21 DIAGNOSIS — I2510 Atherosclerotic heart disease of native coronary artery without angina pectoris: Secondary | ICD-10-CM | POA: Diagnosis not present

## 2020-05-23 DIAGNOSIS — U071 COVID-19: Secondary | ICD-10-CM | POA: Diagnosis not present

## 2020-06-01 DIAGNOSIS — R791 Abnormal coagulation profile: Secondary | ICD-10-CM | POA: Diagnosis not present

## 2020-06-06 ENCOUNTER — Non-Acute Institutional Stay (SKILLED_NURSING_FACILITY): Payer: Medicare Other | Admitting: Adult Health

## 2020-06-06 ENCOUNTER — Encounter: Payer: Self-pay | Admitting: Adult Health

## 2020-06-06 DIAGNOSIS — U071 COVID-19: Secondary | ICD-10-CM

## 2020-06-06 DIAGNOSIS — R7989 Other specified abnormal findings of blood chemistry: Secondary | ICD-10-CM

## 2020-06-06 DIAGNOSIS — F39 Unspecified mood [affective] disorder: Secondary | ICD-10-CM | POA: Diagnosis not present

## 2020-06-06 DIAGNOSIS — F339 Major depressive disorder, recurrent, unspecified: Secondary | ICD-10-CM | POA: Diagnosis not present

## 2020-06-06 DIAGNOSIS — E034 Atrophy of thyroid (acquired): Secondary | ICD-10-CM | POA: Diagnosis not present

## 2020-06-06 DIAGNOSIS — F0281 Dementia in other diseases classified elsewhere with behavioral disturbance: Secondary | ICD-10-CM | POA: Diagnosis not present

## 2020-06-06 DIAGNOSIS — G309 Alzheimer's disease, unspecified: Secondary | ICD-10-CM | POA: Diagnosis not present

## 2020-06-06 NOTE — Progress Notes (Signed)
Location:  [facility] Room Number: 214-B Place of Service:  SNF (31) Provider:  Kenard Gower, DNP, FNP-BC  Patient Care Team: Pecola Lawless, MD as PCP - General (Internal Medicine)  Extended Emergency Contact Information Primary Emergency Contact: [name],Kelly Address: [address]          [place], [place] [address] [place] of [place] Home Phone: [phone] Relation: Son Secondary Emergency Contact: [name],Kelly  [place] Mobile Phone: [phone] Relation: Daughter  Code Status:  FULL CODE   Goals of care: Advanced Directive information Advanced Directives 04/05/2020  Does Patient Have a Medical Advance Directive? No  Type of Advance Directive -  Does patient want to make changes to medical advance directive? -  Copy of Healthcare Power of Attorney in Chart? -  Would patient like information on creating a medical advance directive? No - Patient declined  Pre-existing out of facility DNR order (yellow form or pink MOST form) -     Chief Complaint  Patient presents with   Medical Management of Chronic Issues    Routine [facility] visit    HPI:  Pt is aN 85 y.o. female seen today for medical management of chronic diseases. She is a long-term care resident of [name]. She has a PMH of dementia, hypothyroidism and hypertension. She was reported to have an episode of being combative and spitting.  She takes aripiprazol 7.5 mg daily for psychosis and divalproex [place] 250 mg 3 times a day for mood disorder. She has recently completed treatment for COVID-19 infection. She was treated with [name] (monoclonal antibody), Doxycycline and Zinc. She is now back to her regular room. No reported cough, SOB nor fever. She remains to be on Eliquis due to elevated D-Dimer 0.7 (05/23/20).  Past Medical History:  Diagnosis Date   Chronic kidney disease    Stage III   Dementia with behavioral  disturbance (HCC) 03/19/2015   Notated on FL2 Form from Kelly. Dreama [name] [phone]   Depression    previously saw [name]   Diabetes mellitus without complication (HCC)    11/18/15 A1c 6%   Dyslipidemia    11/18/15 Tg 74, HDL 60, LDL 90 on statin   Hypertension    Hypothyroidism    11/18/15 TSH 53.6, 02/07/16 TSH 9.42 @ [facility]   MDD (major depressive disorder)    With behavioral disturbance   Scabies    02/21/16 [facility],[place]; Rx: Permethrin cream   Urinary tract infection due to Proteus 05/04/2019   Uniformly sensitive except to nitrofurantoin; Cipro prescribed   Vitamin D deficiency    11/18/15 vitamin D 25-hydroxy 39   Past Surgical History:  Procedure Laterality Date   no record     04/10/15 [facility] note: see old chart    No Known Allergies  Outpatient Encounter Medications as of 06/06/2020  Medication Sig   acetaminophen (TYLENOL) 325 MG tablet Take 650 mg by mouth every 6 (six) hours as needed for mild pain or moderate pain.   ARIPiprazole (ABILIFY) 5 MG tablet Take 7.5 mg by mouth daily. 1.5 tablets to = 7.5 mg   aspirin EC 81 MG tablet Take 81 mg by mouth daily.    atorvastatin (LIPITOR) 40 MG tablet Take 40 mg by mouth at bedtime.    bisacodyl (DULCOLAX) 10 MG suppository If not relieved by MOM, give 10 mg Bisacodyl suppositiory rectally X 1 dose in 24 hours as needed (Do not use constipation standing orders  for residents with renal failure/CFR less than 30. Contact MD for orders) (Physician Order)   Calcium Carb-Cholecalciferol (CALCIUM-VITAMIN D3) 600-400 MG-UNIT TABS Take 1 tablet by mouth 2 (two) times daily.   Cholecalciferol (VITAMIN D) 2000 units tablet Take 2,000 Units by mouth daily.    divalproex (DEPAKOTE SPRINKLE) 125 MG capsule Take 250 mg by mouth 3 (three) times daily.   donepezil (ARICEPT) 10 MG tablet Take 10 mg by mouth at bedtime.    furosemide (LASIX) 20 MG tablet Take 20 mg by mouth every Monday, Wednesday, and  Friday.    levothyroxine (SYNTHROID) 200 MCG tablet Take 200 mcg by mouth daily before breakfast. Take with 25 mcg to = 225 mcg   levothyroxine (SYNTHROID) 25 MCG tablet Take 25 mcg by mouth daily before breakfast. Take with a 200 mcg tab to = 225 mcg   magnesium hydroxide (MILK OF MAGNESIA) 400 MG/5ML suspension If no BM in 3 days, give 30 cc Milk of Magnesium p.o. x 1 dose in 24 hours as needed (Do not use standing constipation orders for residents with renal failure CFR less than 30. Contact MD for orders) (Physician Order)   memantine (NAMENDA) 10 MG tablet Take 10 mg by mouth 2 (two) times daily.    Multiple Vitamin (MULTI-VITAMIN DAILY PO) Take one tablet by mouth once daily    Nutritional Supplement LIQD Take 120 mLs by mouth 2 (two) times daily. MedPass   Nutritional Supplements (NUTRITIONAL SUPPLEMENT PO) Take 1 each by mouth daily. Magic Cup to help stabilize weight   PARoxetine (PAXIL) 10 MG tablet Take 15 mg by mouth at bedtime. Take 1-1/2 tablets to = 15 mg qd   sennosides-docusate sodium (SENOKOT-S) 8.6-50 MG tablet Take 2 tablets by mouth daily.    Skin Protectants, Misc. (MINERIN) CREA Apply topically to ankles twice a day for dry skin.   Sodium Phosphates (RA SALINE ENEMA RE) If not relieved by Biscodyl suppository, give disposable Saline Enema rectally X 1 dose/24 hrs as needed (Do not use constipation standing orders for residents with renal failure/CFR less than 30. Contact MD for orders)(Physician Or   No facility-administered encounter medications on file as of 06/06/2020.    Review of Systems  GENERAL: No change in appetite, no fatigue, no weight changes, no fever, chills or weakness MOUTH and THROAT: Denies oral discomfort, gingival pain or bleeding RESPIRATORY: no cough, SOB, DOE, wheezing, hemoptysis CARDIAC: No chest pain, edema or palpitations GI: No abdominal pain, diarrhea, constipation, heart burn, nausea or vomiting GU: Denies dysuria, frequency,  hematuria or discharge NEUROLOGICAL: Denies dizziness, syncope, numbness, or headache PSYCHIATRIC:  No report of hallucinations, insomnia, paranoia, or agitation   Immunization History  Administered Date(s) Administered   Influenza-Unspecified 02/17/2016, 08/01/2018, 03/18/2019, 04/07/2020   Moderna Sars-Covid-2 Vaccination 07/06/2019, 08/03/2019   PPD Test 04/08/2016   Pneumococcal Conjugate-13 01/11/2017   Pneumococcal-Unspecified 02/16/2014   Tdap 02/27/2019   Pertinent  Health Maintenance Due  Topic Date Due   FOOT EXAM  04/16/2020   HEMOGLOBIN A1C  06/02/2020   INFLUENZA VACCINE  Completed   PNA vac Low Risk Adult  Completed   OPHTHALMOLOGY EXAM  Discontinued   URINE MICROALBUMIN  Discontinued   DEXA SCAN  Discontinued   Fall Risk  01/16/2019 01/14/2018 01/10/2017 08/07/2016 04/20/2016  Falls in the past year? 1 No No Yes Yes  Number falls in past yr: 1 - - 2 or more 2 or more  Injury with Fall? 0 - - - Yes  Risk Factor Category  - - - -  High Fall Risk  Risk for fall due to : History of fall(s);Impaired balance/gait;Impaired mobility - - - -  Follow up Falls evaluation completed;Falls prevention discussed;Education provided - - - -     Vitals:   06/06/20 1140  BP: 124/76  Pulse: 77  Resp: 20  Temp: (!) 97.2 F (36.2 C)  TempSrc: Oral  Weight: 170 lb 6.4 oz (77.3 kg)  Height: 5\' 9"  (1.753 m)   Body mass index is 25.16 kg/m.  Physical Exam  GENERAL APPEARANCE: Well nourished. In no acute distress. Normal body habitus SKIN:  Skin is warm and dry.  MOUTH and THROAT: Lips are without lesions. Oral mucosa is moist and without lesions. Tongue is normal in shape, size, and color and without lesions RESPIRATORY: Breathing is even & unlabored, BS CTAB CARDIAC: RRR, no murmur,no extra heart sounds, no edema NEUROLOGICAL: There is no tremor. Speech is clear. Alert to self, disoriented to time and place PSYCHIATRIC: Agitated  Labs reviewed: Recent Labs     12/02/19 0000  NA 143  K 4.3  CL 108  CO2 24*  BUN 20  CREATININE 0.9  CALCIUM 9.3   Recent Labs    12/02/19 0000  AST 21  ALT 8  ALKPHOS 68  ALBUMIN 3.5   Recent Labs    10/19/19 0000  WBC 3.6  NEUTROABS 1  HGB 12.3  HCT 37  PLT 112*   Lab Results  Component Value Date   TSH 3.39 12/02/2019   Lab Results  Component Value Date   HGBA1C 6.1 12/02/2019   Lab Results  Component Value Date   CHOL 148 12/02/2019   HDL 42 12/02/2019   LDLCALC 83 12/02/2019   TRIG 114 12/02/2019    Assessment/Plan  1. Positive D dimer -  Repeat d-dimer 0.4, down from 0.7 -  No SOB -  Will discontinue Eliquis  2. Hypothyroidism due to acquired atrophy of thyroid Lab Results  Component Value Date   TSH 3.39 12/02/2019   -  Continue levothyroxine  3. COVID-19 virus infection -  completed treatment, resolved  4. Mood disorder with psychosis (HCC) -  Has episodes of agitation, continue Depakote and aripiprazole -  Followed by psych NP  5. Major depression, recurrent, chronic (HCC) -Continue paroxetine  6. Alzheimer's dementia with behavioral disturbance, unspecified timing of dementia onset (HCC) -Continue donepezil and memantine -Continue supportive care    Family/ staff Communication: Discussed plan of care with resident and charge nurse.  Labs/tests ordered:  None  Goals of care:   Long-term care  12/04/2019, DNP, MSN, FNP-BC [facility] [phone] (Monday-Friday 8:00 a.m. - 5:00 p.m.) [phone] (after hours)

## 2020-06-15 DIAGNOSIS — F39 Unspecified mood [affective] disorder: Secondary | ICD-10-CM | POA: Diagnosis not present

## 2020-06-15 DIAGNOSIS — F0390 Unspecified dementia without behavioral disturbance: Secondary | ICD-10-CM | POA: Diagnosis not present

## 2020-06-15 DIAGNOSIS — F338 Other recurrent depressive disorders: Secondary | ICD-10-CM | POA: Diagnosis not present

## 2020-06-15 DIAGNOSIS — F22 Delusional disorders: Secondary | ICD-10-CM | POA: Diagnosis not present

## 2020-06-28 ENCOUNTER — Non-Acute Institutional Stay (SKILLED_NURSING_FACILITY): Payer: Medicare Other | Admitting: Adult Health

## 2020-06-28 ENCOUNTER — Encounter: Payer: Self-pay | Admitting: Adult Health

## 2020-06-28 DIAGNOSIS — E034 Atrophy of thyroid (acquired): Secondary | ICD-10-CM

## 2020-06-28 DIAGNOSIS — E1122 Type 2 diabetes mellitus with diabetic chronic kidney disease: Secondary | ICD-10-CM | POA: Diagnosis not present

## 2020-06-28 DIAGNOSIS — R600 Localized edema: Secondary | ICD-10-CM

## 2020-06-28 DIAGNOSIS — F339 Major depressive disorder, recurrent, unspecified: Secondary | ICD-10-CM

## 2020-06-28 DIAGNOSIS — E782 Mixed hyperlipidemia: Secondary | ICD-10-CM | POA: Diagnosis not present

## 2020-06-28 DIAGNOSIS — F39 Unspecified mood [affective] disorder: Secondary | ICD-10-CM

## 2020-06-28 DIAGNOSIS — N182 Chronic kidney disease, stage 2 (mild): Secondary | ICD-10-CM

## 2020-06-28 NOTE — Progress Notes (Signed)
Location:  [facility] Room Number: 214-B Place of Service:  SNF (31) Provider:  Kenard Gower, DNP, FNP-BC  Patient Care Team: Pecola Lawless, MD as PCP - General (Internal Medicine)  Extended Emergency Contact Information Primary Emergency Contact: Robinson,Roberto Address: [address]          [place], [place] [address] [place] Home Phone: [phone] Relation: Son Secondary Emergency Contact: Reid,Cynthia  [place] Mobile Phone: [phone] Relation: Daughter  Code Status:  FULL CODE  Goals of care: Advanced Directive information Advanced Directives 04/05/2020  Does Patient Have a Medical Advance Directive? No  Type of Advance Directive -  Does patient want to make changes to medical advance directive? -  Copy of Healthcare Power of Attorney in Chart? -  Would patient like information on creating a medical advance directive? No - Patient declined  Pre-existing out of facility DNR order (yellow form or pink MOST form) -     Chief Complaint  Patient presents with  . Medical Management of Chronic Issues    Routine [facility] visit    HPI:  Pt is an 86 y.o. female seen today for medical management of chronic diseases. She is a long-term care resident of [facility]. She has a PMH of dementia, hypothyroidism and hypertension. She is currently on restorative eating/swallowing due to dysphagia. She requires verbal cuing to be in upright position to complete meals due to aspiration precautions. She had her Moderna COVID-19 vaccine booster on 1 /04/22. No  side effects reported. Paxil was decreased to 10 mg daily for GDR. She has occasional agitation. .     Past Medical History:  Diagnosis Date  . Chronic kidney disease    Stage III  . Dementia with behavioral disturbance (HCC) 03/19/2015   Notated on FL2 Form from Dr. Dreama Saa [phone]  . Depression    previously saw Dr  Donell Beers  . Diabetes mellitus without complication (HCC)    11/18/15 A1c 6%  . Dyslipidemia    11/18/15 Tg 74, HDL 60, LDL 90 on statin  . Hypertension   . Hypothyroidism    11/18/15 TSH 53.6, 02/07/16 TSH 9.42 @ [facility]  . MDD (major depressive disorder)    With behavioral disturbance  . Scabies    02/21/16 [facility],[place]; Rx: Permethrin cream  . Urinary tract infection due to Proteus 05/04/2019   Uniformly sensitive except to nitrofurantoin; Cipro prescribed  . Vitamin D deficiency    11/18/15 vitamin D 25-hydroxy 39   Past Surgical History:  Procedure Laterality Date  . no record     04/10/15 [facility] note: see old chart    No Known Allergies  Outpatient Encounter Medications as of 06/28/2020  Medication Sig  . acetaminophen (TYLENOL) 325 MG tablet Take 650 mg by mouth every 6 (six) hours as needed for mild pain or moderate pain.  Marland Kitchen apixaban (ELIQUIS) 2.5 MG TABS tablet Take 2.5 mg by mouth 2 (two) times daily.  . ARIPiprazole (ABILIFY) 5 MG tablet Take 7.5 mg by mouth daily. 1.5 tablets to = 7.5 mg  . aspirin EC 81 MG tablet Take 81 mg by mouth daily.   Marland Kitchen atorvastatin (LIPITOR) 40 MG tablet Take 40 mg by mouth at bedtime.   . bisacodyl (DULCOLAX) 10 MG suppository If not relieved by MOM, give 10 mg Bisacodyl suppositiory rectally X 1 dose in 24 hours as needed (Do not use constipation standing orders for residents with renal  failure/CFR less than 30. Contact MD for orders) (Physician Order)  . Calcium Carb-Cholecalciferol (CALCIUM-VITAMIN D3) 600-400 MG-UNIT TABS Take 1 tablet by mouth 2 (two) times daily.  . Cholecalciferol (VITAMIN D) 2000 units tablet Take 2,000 Units by mouth daily.   . divalproex (DEPAKOTE SPRINKLE) 125 MG capsule Take 250 mg by mouth 3 (three) times daily.  Marland Kitchen donepezil (ARICEPT) 10 MG tablet Take 10 mg by mouth at bedtime.   . furosemide (LASIX) 20 MG tablet Take 20 mg by mouth every Monday, Wednesday, and Friday.   . levothyroxine (SYNTHROID)  200 MCG tablet Take 200 mcg by mouth daily before breakfast. Take with 25 mcg to = 225 mcg  . levothyroxine (SYNTHROID) 25 MCG tablet Take 25 mcg by mouth daily before breakfast. Take with a 200 mcg tab to = 225 mcg  . magnesium hydroxide (MILK OF MAGNESIA) 400 MG/5ML suspension If no BM in 3 days, give 30 cc Milk of Magnesium p.o. x 1 dose in 24 hours as needed (Do not use standing constipation orders for residents with renal failure CFR less than 30. Contact MD for orders) (Physician Order)  . memantine (NAMENDA) 10 MG tablet Take 10 mg by mouth 2 (two) times daily.   . Multiple Vitamin (MULTI-VITAMIN DAILY PO) Take one tablet by mouth once daily  . Nutritional Supplement LIQD Take 120 mLs by mouth 2 (two) times daily. MedPass  . Nutritional Supplements (NUTRITIONAL SUPPLEMENT PO) Take 1 each by mouth daily. Magic Cup to help stabilize weight  . PARoxetine (PAXIL) 10 MG tablet Take 15 mg by mouth at bedtime. Take 1-1/2 tablets to = 15 mg qd  . sennosides-docusate sodium (SENOKOT-S) 8.6-50 MG tablet Take 2 tablets by mouth daily.   . Skin Protectants, Misc. (MINERIN) CREA Apply topically to ankles twice a day for dry skin.  . Sodium Phosphates (RA SALINE ENEMA RE) If not relieved by Biscodyl suppository, give disposable Saline Enema rectally X 1 dose/24 hrs as needed (Do not use constipation standing orders for residents with renal failure/CFR less than 30. Contact MD for orders)(Physician Or   No facility-administered encounter medications on file as of 06/28/2020.    Review of Systems  GENERAL: No fever or chills  MOUTH and THROAT: Denies oral discomfort, gingival pain or bleeding RESPIRATORY: no cough, SOB, DOE, wheezing, hemoptysis CARDIAC: No chest pain, edema or palpitations GI: No abdominal pain, diarrhea, constipation, heart burn, nausea or vomiting GU: Denies dysuria, frequency, hematuria or discharge NEUROLOGICAL: Denies dizziness, syncope, numbness, or headache PSYCHIATRIC: Has  occasional agitation   Immunization History  Administered Date(s) Administered  . Influenza-Unspecified 02/17/2016, 08/01/2018, 03/18/2019, 04/07/2020  . Moderna Sars-Covid-2 Vaccination 07/06/2019, 08/03/2019  . PPD Test 04/08/2016  . Pneumococcal Conjugate-13 01/11/2017  . Pneumococcal-Unspecified 02/16/2014  . Tdap 02/27/2019   Pertinent  Health Maintenance Due  Topic Date Due  . FOOT EXAM  04/16/2020  . HEMOGLOBIN A1C  06/02/2020  . INFLUENZA VACCINE  Completed  . PNA vac Low Risk Adult  Completed  . OPHTHALMOLOGY EXAM  Discontinued  . URINE MICROALBUMIN  Discontinued  . DEXA SCAN  Discontinued   Fall Risk  01/16/2019 01/14/2018 01/10/2017 08/07/2016 04/20/2016  Falls in the past year? 1 No No Yes Yes  Number falls in past yr: 1 - - 2 or more 2 or more  Injury with Fall? 0 - - - Yes  Risk Factor Category  - - - - High Fall Risk  Risk for fall due to : History of fall(s);Impaired balance/gait;Impaired mobility - - - -  Follow up Falls evaluation completed;Falls prevention discussed;Education provided - - - -     Vitals:   06/28/20 0917  Weight: 159 lb 12.8 oz (72.5 kg)  Height: 5\' 9"  (1.753 m)   Body mass index is 23.6 kg/m.  Physical Exam  GENERAL APPEARANCE: Well nourished. In no acute distress. Normal body habitus SKIN:  Skin is warm and dry.  MOUTH and THROAT: Lips are without lesions. Oral mucosa is moist and without lesions. Tongue is normal in shape, size, and color and without lesions RESPIRATORY: Breathing is even & unlabored, BS CTAB CARDIAC: RRR, no murmur,no extra heart sounds, no edema GI: Abdomen soft, normal BS, no masses, no tenderness EXTREMITIES:  Able to move X 4 extremities NEUROLOGICAL: There is no tremor. Speech is clear. Alert to self, disoriented to time and place. PSYCHIATRIC:  Affect and behavior are appropriate  Labs reviewed: Recent Labs    12/02/19 0000  NA 143  K 4.3  CL 108  CO2 24*  BUN 20  CREATININE 0.9  CALCIUM 9.3    Recent Labs    12/02/19 0000  AST 21  ALT 8  ALKPHOS 68  ALBUMIN 3.5   Recent Labs    10/19/19 0000  WBC 3.6  NEUTROABS 1  HGB 12.3  HCT 37  PLT 112*   Lab Results  Component Value Date   TSH 3.39 12/02/2019   Lab Results  Component Value Date   HGBA1C 6.1 12/02/2019   Lab Results  Component Value Date   CHOL 148 12/02/2019   HDL 42 12/02/2019   LDLCALC 83 12/02/2019   TRIG 114 12/02/2019    Assessment/Plan  1. Hypothyroidism due to acquired atrophy of thyroid Lab Results  Component Value Date   TSH 3.39 12/02/2019   -Continue levothyroxine  2. Type 2 diabetes mellitus with stage 2 chronic kidney disease, without long-term current use of insulin (HCC) Lab Results  Component Value Date   HGBA1C 6.1 12/02/2019   -Diet controlled -  Refer for eye and foot exam   3. Lower extremity edema -  Stable, continue furosemide  4. Mixed hyperlipidemia Lab Results  Component Value Date   CHOL 148 12/02/2019   HDL 42 12/02/2019   LDLCALC 83 12/02/2019   TRIG 114 12/02/2019   -Continue Lipitor  5. Mood disorder with psychosis (HCC) -  Stable, continue aripiprazole and divalproex DR -  Followed by psych NP  6. Major depression, recurrent, chronic (HCC) -  Mood is stable, continue Paxil     Family/ staff Communication:  Discussed plan of care with resident and charge nurse.  Labs/tests ordered:  hgbA1C, lipid panel, CBC and CMP  Goals of care:  Long-term care   12/04/2019, DNP, MSN, FNP-BC [facility] [phone] (Monday-Friday 8:00 a.m. - 5:00 p.m.) [phone] (after hours)

## 2020-07-01 ENCOUNTER — Encounter: Payer: Self-pay | Admitting: Adult Health

## 2020-07-02 LAB — LIPID PANEL
Cholesterol: 144 (ref 0–200)
HDL: 33 — AB (ref 35–70)
LDL Cholesterol: 88
LDl/HDL Ratio: 4.3
Triglycerides: 113 (ref 40–160)

## 2020-07-02 LAB — CBC AND DIFFERENTIAL
HCT: 35 — AB (ref 36–46)
Hemoglobin: 11.9 — AB (ref 12.0–16.0)
Neutrophils Absolute: 0.7
Platelets: 95 — AB (ref 150–399)
WBC: 3.6

## 2020-07-02 LAB — BASIC METABOLIC PANEL
BUN: 18 (ref 4–21)
CO2: 30 — AB (ref 13–22)
Chloride: 106 (ref 99–108)
Creatinine: 0.8 (ref 0.5–1.1)
Glucose: 82
Potassium: 4.3 (ref 3.4–5.3)
Sodium: 145 (ref 137–147)

## 2020-07-02 LAB — COMPREHENSIVE METABOLIC PANEL
Albumin: 3.4 — AB (ref 3.5–5.0)
Calcium: 9.1 (ref 8.7–10.7)
GFR calc Af Amer: 74.35
GFR calc non Af Amer: 64.15
Globulin: 2.3

## 2020-07-02 LAB — CBC: RBC: 3.92 (ref 3.87–5.11)

## 2020-07-02 LAB — HEPATIC FUNCTION PANEL
ALT: 10 (ref 7–35)
AST: 19 (ref 13–35)
Alkaline Phosphatase: 67 (ref 25–125)
Bilirubin, Total: 0.3

## 2020-07-02 LAB — HEMOGLOBIN A1C: Hemoglobin A1C: 6

## 2020-07-05 ENCOUNTER — Non-Acute Institutional Stay (SKILLED_NURSING_FACILITY): Payer: Medicare Other | Admitting: Internal Medicine

## 2020-07-05 ENCOUNTER — Encounter: Payer: Self-pay | Admitting: Internal Medicine

## 2020-07-05 DIAGNOSIS — R7303 Prediabetes: Secondary | ICD-10-CM

## 2020-07-05 DIAGNOSIS — D61818 Other pancytopenia: Secondary | ICD-10-CM

## 2020-07-05 DIAGNOSIS — E782 Mixed hyperlipidemia: Secondary | ICD-10-CM | POA: Diagnosis not present

## 2020-07-05 NOTE — Assessment & Plan Note (Signed)
07/02/2020 lipids are at goal except for HDL of 33.  No change in therapy indicated.

## 2020-07-05 NOTE — Assessment & Plan Note (Signed)
Despite Abilify therapy, A1c remains prediabetic at 6%

## 2020-07-05 NOTE — Patient Instructions (Signed)
See assessment and plan under each diagnosis in the problem list and acutely for this visit 

## 2020-07-05 NOTE — Assessment & Plan Note (Addendum)
Unfortunately abdominal exam to assess possible hypersplenism could not be completed due to lack of cooperation.  Unfortunately we will simply have to monitor the blood counts to rule out progression. Pancytopenia will be reviewed in Up to Date.
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
## 2020-07-06 ENCOUNTER — Encounter: Payer: Self-pay | Admitting: Internal Medicine

## 2020-07-13 ENCOUNTER — Encounter: Payer: Self-pay | Admitting: Adult Health

## 2020-07-13 ENCOUNTER — Non-Acute Institutional Stay (SKILLED_NURSING_FACILITY): Payer: Medicare Other | Admitting: Adult Health

## 2020-07-13 DIAGNOSIS — Z7189 Other specified counseling: Secondary | ICD-10-CM | POA: Diagnosis not present

## 2020-07-13 DIAGNOSIS — F339 Major depressive disorder, recurrent, unspecified: Secondary | ICD-10-CM

## 2020-07-13 DIAGNOSIS — E034 Atrophy of thyroid (acquired): Secondary | ICD-10-CM

## 2020-07-13 DIAGNOSIS — F39 Unspecified mood [affective] disorder: Secondary | ICD-10-CM | POA: Diagnosis not present

## 2020-07-13 DIAGNOSIS — R600 Localized edema: Secondary | ICD-10-CM

## 2020-07-13 DIAGNOSIS — G309 Alzheimer's disease, unspecified: Secondary | ICD-10-CM

## 2020-07-13 DIAGNOSIS — F0281 Dementia in other diseases classified elsewhere with behavioral disturbance: Secondary | ICD-10-CM
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
## 2020-08-02 ENCOUNTER — Encounter: Payer: Self-pay | Admitting: Adult Health

## 2020-08-02 ENCOUNTER — Non-Acute Institutional Stay (SKILLED_NURSING_FACILITY): Payer: Medicare Other | Admitting: Adult Health

## 2020-08-02 DIAGNOSIS — G309 Alzheimer's disease, unspecified: Secondary | ICD-10-CM

## 2020-08-02 DIAGNOSIS — F339 Major depressive disorder, recurrent, unspecified: Secondary | ICD-10-CM

## 2020-08-02 DIAGNOSIS — F39 Unspecified mood [affective] disorder: Secondary | ICD-10-CM

## 2020-08-02 DIAGNOSIS — N182 Chronic kidney disease, stage 2 (mild): Secondary | ICD-10-CM

## 2020-08-02 DIAGNOSIS — R600 Localized edema: Secondary | ICD-10-CM | POA: Diagnosis not present

## 2020-08-02 DIAGNOSIS — E034 Atrophy of thyroid (acquired): Secondary | ICD-10-CM

## 2020-08-02 DIAGNOSIS — E1122 Type 2 diabetes mellitus with diabetic chronic kidney disease: Secondary | ICD-10-CM

## 2020-08-02 DIAGNOSIS — F0281 Dementia in other diseases classified elsewhere with behavioral disturbance: Secondary | ICD-10-CM

## 2020-08-02 NOTE — Progress Notes (Signed)
Location:  [facility] Room Number: 214/B Place of Service:  SNF (31) Provider:  Kenard Gower, DNP, FNP-BC  Patient Care Team: Pecola Lawless, MD as PCP - General (Internal Medicine)  Extended Emergency Contact Information Primary Emergency Contact: Robinson,Roberto Address: [address]          [place], [place] [address] [place] Home Phone: [phone] Relation: Son Secondary Emergency Contact: Reid,Cynthia  [place] Mobile Phone: [phone] Relation: Daughter  Code Status:  Full Code  Goals of care: Advanced Directive information Advanced Directives 08/02/2020  Does Patient Have a Medical Advance Directive? Yes  Type of Advance Directive Healthcare Power of Attorney  Does patient want to make changes to medical advance directive? No - Patient declined  Copy of Healthcare Power of Attorney in Chart? Yes - validated most recent copy scanned in chart (See row information)  Would patient like information on creating a medical advance directive? -  Pre-existing out of facility DNR order (yellow form or pink MOST form) -     Chief Complaint  Patient presents with  . Medical Management of Chronic Issues    Routine Visit of Medical Management     HPI:  Pt is a 86 y.o. female seen today for medical management of chronic diseases.  She has a PMH of dementia, hypothyroidism and hypertension.  She is a long-term care resident of [facility]. Latest hgbA1C 6.0, 07/02/20. She does not take any diabetic medications. She takes Furosemide 20 mg daily on MWFs for BLE edema, noted trace edema.   Past Medical History:  Diagnosis Date  . Chronic kidney disease    Stage III  . Dementia with behavioral disturbance (HCC) 03/19/2015   Notated on FL2 Form from Dr. Dreama Saa [phone]  . Depression    previously saw Dr Donell Beers  . Diabetes mellitus without complication (HCC)    11/18/15 A1c 6%   . Dyslipidemia    11/18/15 Tg 74, HDL 60, LDL 90 on statin  . Hypertension   . Hypothyroidism    11/18/15 TSH 53.6, 02/07/16 TSH 9.42 @ [facility]  . MDD (major depressive disorder)    With behavioral disturbance  . Scabies    02/21/16 [facility],[place]; Rx: Permethrin cream  . Urinary tract infection due to Proteus 05/04/2019   Uniformly sensitive except to nitrofurantoin; Cipro prescribed  . Vitamin D deficiency    11/18/15 vitamin D 25-hydroxy 39   Past Surgical History:  Procedure Laterality Date  . no record     04/10/15 [facility] note: see old chart    No Known Allergies  Outpatient Encounter Medications as of 08/02/2020  Medication Sig  . acetaminophen (TYLENOL) 325 MG tablet Take 650 mg by mouth every 6 (six) hours as needed for mild pain or moderate pain.  . ARIPiprazole (ABILIFY) 5 MG tablet Take 7.5 mg by mouth daily. 1.5 tablets to = 7.5 mg  . aspirin EC 81 MG tablet Take 81 mg by mouth daily.   Marland Kitchen atorvastatin (LIPITOR) 40 MG tablet Take 40 mg by mouth at bedtime.   . bisacodyl (DULCOLAX) 10 MG suppository If not relieved by MOM, give 10 mg Bisacodyl suppositiory rectally X 1 dose in 24 hours as needed (Do not use constipation standing orders for residents with renal failure/CFR less than 30. Contact MD for orders) (Physician Order)  . Calcium Carb-Cholecalciferol (CALCIUM-VITAMIN D3) 600-400 MG-UNIT TABS Take 1 tablet by mouth 2 (two) times daily.  Marland Kitchen  Cholecalciferol (VITAMIN D) 2000 units tablet Take 2,000 Units by mouth daily.   . divalproex (DEPAKOTE SPRINKLE) 125 MG capsule Take 250 mg by mouth 3 (three) times daily.  Marland Kitchen donepezil (ARICEPT) 10 MG tablet Take 10 mg by mouth at bedtime.   . furosemide (LASIX) 20 MG tablet Take 20 mg by mouth every Monday, Wednesday, and Friday.   . levothyroxine (SYNTHROID) 200 MCG tablet Take 200 mcg by mouth daily before breakfast. Take with 25 mcg to = 225 mcg  . levothyroxine (SYNTHROID) 25 MCG tablet Take 25 mcg by mouth  daily before breakfast. Take with a 200 mcg tab to = 225 mcg  . magnesium hydroxide (MILK OF MAGNESIA) 400 MG/5ML suspension If no BM in 3 days, give 30 cc Milk of Magnesium p.o. x 1 dose in 24 hours as needed (Do not use standing constipation orders for residents with renal failure CFR less than 30. Contact MD for orders) (Physician Order)  . memantine (NAMENDA) 10 MG tablet Take 10 mg by mouth 2 (two) times daily.   . Multiple Vitamin (MULTI-VITAMIN DAILY PO) Take one tablet by mouth once daily  . Nutritional Supplement LIQD Take 120 mLs by mouth in the morning, at noon, and at bedtime. MedPass  . Nutritional Supplements (NUTRITIONAL SUPPLEMENT PO) Take 1 each by mouth daily. Magic Cup to help stabilize weight  . PARoxetine (PAXIL) 10 MG tablet Take 10 mg by mouth daily. For Depression  . sennosides-docusate sodium (SENOKOT-S) 8.6-50 MG tablet Take 2 tablets by mouth daily.   . Skin Protectants, Misc. (MINERIN) CREA Apply topically to ankles twice a day for dry skin.  . Sodium Phosphates (RA SALINE ENEMA RE) If not relieved by Biscodyl suppository, give disposable Saline Enema rectally X 1 dose/24 hrs as needed (Do not use constipation standing orders for residents with renal failure/CFR less than 30. Contact MD for orders)(Physician Or   No facility-administered encounter medications on file as of 08/02/2020.    Review of Systems  GENERAL: No change in appetite, no fatigue, no weight changes, no fever, chills or weakness MOUTH and THROAT: Denies oral discomfort, gingival pain or bleeding, pain from teeth or hoarseness   RESPIRATORY: no cough, SOB, DOE, wheezing, hemoptysis CARDIAC: No chest pain or palpitations GI: No abdominal pain, diarrhea, constipation, heart burn, nausea or vomiting GU: Denies dysuria, frequency, hematuria or discharge NEUROLOGICAL: Denies dizziness, syncope, numbness, or headache PSYCHIATRIC: Denies feelings of depression or anxiety. No report of hallucinations,  insomnia, paranoia, or agitation   Immunization History  Administered Date(s) Administered  . Influenza-Unspecified 02/17/2016, 08/01/2018, 03/18/2019, 04/07/2020  . Moderna Sars-Covid-2 Vaccination 07/06/2019, 08/03/2019  . PPD Test 04/08/2016  . Pneumococcal Conjugate-13 01/11/2017  . Pneumococcal-Unspecified 02/16/2014  . Tdap 02/27/2019  . Unspecified SARS-COV-2 Vaccination 06/21/2020   Pertinent  Health Maintenance Due  Topic Date Due  . FOOT EXAM  04/16/2020  . HEMOGLOBIN A1C  12/30/2020  . INFLUENZA VACCINE  Completed  . PNA vac Low Risk Adult  Completed  . OPHTHALMOLOGY EXAM  Discontinued  . URINE MICROALBUMIN  Discontinued  . DEXA SCAN  Discontinued   Fall Risk  01/16/2019 01/14/2018 01/10/2017 08/07/2016 04/20/2016  Falls in the past year? 1 No No Yes Yes  Number falls in past yr: 1 - - 2 or more 2 or more  Injury with Fall? 0 - - - Yes  Risk Factor Category  - - - - High Fall Risk  Risk for fall due to : History of fall(s);Impaired balance/gait;Impaired mobility - - - -  Follow up Falls evaluation completed;Falls prevention discussed;Education provided - - - -     Vitals:   08/02/20 1445  BP: 130/83  Pulse: 66  Resp: 16  Temp: 97.9 F (36.6 C)  SpO2: 95%  Weight: 167 lb 6.4 oz (75.9 kg)  Height: 5\' 9"  (1.753 m)   Body mass index is 24.72 kg/m.  Physical Exam  GENERAL APPEARANCE: Well nourished. In no acute distress. Normal body habitus SKIN:  Skin is warm and dry.  MOUTH and THROAT: Lips are without lesions. Oral mucosa is moist and without lesions. Tongue is normal in shape, size, and color and without lesions RESPIRATORY: Breathing is even & unlabored, BS CTAB CARDIAC: RRR, no murmur,no extra heart sounds, BLE trace edema GI: Abdomen soft, normal BS, no masses, no tenderness EXTREMITIES:  Able to move X 4 extremities. NEUROLOGICAL: There is no tremor. Speech is clear. Alert to self, disoriented to time and place. PSYCHIATRIC:  Affect and behavior are  appropriate  Labs reviewed: Recent Labs    12/02/19 0000 07/02/20 0000  NA 143 145  K 4.3 4.3  CL 108 106  CO2 24* 30*  BUN 20 18  CREATININE 0.9 0.8  CALCIUM 9.3 9.1   Recent Labs    12/02/19 0000 07/02/20 0000  AST 21 19  ALT 8 10  ALKPHOS 68 67  ALBUMIN 3.5 3.4*   Recent Labs    10/19/19 0000 07/02/20 0000  WBC 3.6 3.6  NEUTROABS 1 0.70  HGB 12.3 11.9*  HCT 37 35*  PLT 112* 95*   Lab Results  Component Value Date   TSH 3.39 12/02/2019   Lab Results  Component Value Date   HGBA1C 6.0 07/02/2020   Lab Results  Component Value Date   CHOL 144 07/02/2020   HDL 33 (A) 07/02/2020   LDLCALC 88 07/02/2020   TRIG 113 07/02/2020     Assessment/Plan  1. Type 2 diabetes mellitus with stage 2 chronic kidney disease, without long-term current use of insulin (HCC) Lab Results  Component Value Date   HGBA1C 6.0 07/02/2020   -   Diet-controlled  2. Lower extremity edema -    Stable, continue furosemide -    Encourage elevation of legs  3. Hypothyroidism due to acquired atrophy of thyroid Lab Results  Component Value Date   TSH 3.39 12/02/2019   -   Continue levothyroxine  4. Mood disorder with psychosis (HCC) -   Has reports of agitation, continue Divalproex DR and Aripiprazole -    followed up by psych NP  5. Major depression, recurrent, chronic (HCC) -   Stable, continue paroxetine  6. Alzheimer's dementia with behavioral disturbance, unspecified timing of dementia onset (HCC) -    Continue donepezil and memantine -    Continue supportive care     Family/ staff Communication:    Discussed plan of care with resident and charge nurse.  Labs/tests ordered: None  Goals of care:   Long-term care     12/04/2019, DNP, MSN, FNP-BC [facility] [phone] (Monday-Friday 8:00 a.m. - 5:00 p.m.) [phone] (after hours)

## 2020-08-22 ENCOUNTER — Non-Acute Institutional Stay (SKILLED_NURSING_FACILITY): Payer: Medicare Other | Admitting: Adult Health

## 2020-08-22 ENCOUNTER — Encounter: Payer: Self-pay | Admitting: Adult Health

## 2020-08-22 DIAGNOSIS — N182 Chronic kidney disease, stage 2 (mild): Secondary | ICD-10-CM

## 2020-08-22 DIAGNOSIS — G309 Alzheimer's disease, unspecified: Secondary | ICD-10-CM

## 2020-08-22 DIAGNOSIS — R600 Localized edema: Secondary | ICD-10-CM | POA: Diagnosis not present

## 2020-08-22 DIAGNOSIS — E1122 Type 2 diabetes mellitus with diabetic chronic kidney disease: Secondary | ICD-10-CM | POA: Diagnosis not present

## 2020-08-22 DIAGNOSIS — F39 Unspecified mood [affective] disorder: Secondary | ICD-10-CM

## 2020-08-22 DIAGNOSIS — E034 Atrophy of thyroid (acquired): Secondary | ICD-10-CM | POA: Diagnosis not present

## 2020-08-22 DIAGNOSIS — F0281 Dementia in other diseases classified elsewhere with behavioral disturbance: Secondary | ICD-10-CM

## 2020-08-22 DIAGNOSIS — F339 Major depressive disorder, recurrent, unspecified: Secondary | ICD-10-CM

## 2020-08-22 NOTE — Progress Notes (Signed)
Location:  [facility] Room Number: 214/B Place of Service:  SNF (31) Provider:  Kenard Gower, DNP, FNP-BC  Patient Care Team: Pecola Lawless, MD as PCP - General (Internal Medicine)  Extended Emergency Contact Information Primary Emergency Contact: Robinson,Roberto Address: [address]          [place], [place] [address] [place] Home Phone: [phone] Relation: Son Secondary Emergency Contact: Reid,Cynthia  [place] Mobile Phone: [phone] Relation: Daughter  Code Status:  Full Code  Goals of care: Advanced Directive information Advanced Directives 08/22/2020  Does Patient Have a Medical Advance Directive? Yes  Type of Advance Directive Healthcare Power of Attorney  Does patient want to make changes to medical advance directive? No - Patient declined  Copy of Healthcare Power of Attorney in Chart? Yes - validated most recent copy scanned in chart (See row information)  Would patient like information on creating a medical advance directive? -  Pre-existing out of facility DNR order (yellow form or pink MOST form) -     Chief Complaint  Patient presents with  . Medical Management of Chronic Issues    Routine Visit of Medical Management     HPI:  Pt is a 86 y.o. female seen today for medical management of chronic diseases. She is a long-term care resident of [facility].  She has a PMH of dementia, hypothyroidism and hypertension. She was seen in the room today. She has trace edema on BLE. She takes furosemide 20 mg 1 tab daily on MWF for bilateral lower extremity edema. TSH 3.39, 12/02/19. She takes levothyroxine 200 mcg 1 tab daily and levothyroxine 25 mcg 1 tab daily for hypothyroidism.   Past Medical History:  Diagnosis Date  . Chronic kidney disease    Stage III  . Dementia with behavioral disturbance (HCC) 03/19/2015   Notated on FL2 Form from Dr. Dreama Saa [phone]   . Depression    previously saw Dr Donell Beers  . Diabetes mellitus without complication (HCC)    11/18/15 A1c 6%  . Dyslipidemia    11/18/15 Tg 74, HDL 60, LDL 90 on statin  . Hypertension   . Hypothyroidism    11/18/15 TSH 53.6, 02/07/16 TSH 9.42 @ [facility]  . MDD (major depressive disorder)    With behavioral disturbance  . Scabies    02/21/16 [facility],[place]; Rx: Permethrin cream  . Urinary tract infection due to Proteus 05/04/2019   Uniformly sensitive except to nitrofurantoin; Cipro prescribed  . Vitamin D deficiency    11/18/15 vitamin D 25-hydroxy 39   Past Surgical History:  Procedure Laterality Date  . no record     04/10/15 [facility] note: see old chart    No Known Allergies  Outpatient Encounter Medications as of 08/22/2020  Medication Sig  . acetaminophen (TYLENOL) 325 MG tablet Take 650 mg by mouth every 6 (six) hours as needed for mild pain or moderate pain.  . ARIPiprazole (ABILIFY) 5 MG tablet Take 7.5 mg by mouth daily. 1.5 tablets to = 7.5 mg  . aspirin EC 81 MG tablet Take 81 mg by mouth daily.   Marland Kitchen atorvastatin (LIPITOR) 40 MG tablet Take 40 mg by mouth at bedtime.   . bisacodyl (DULCOLAX) 10 MG suppository If not relieved by MOM, give 10 mg Bisacodyl suppositiory rectally X 1 dose in 24 hours as needed (Do not use constipation standing orders for residents with renal failure/CFR less than 30. Contact MD for orders) (  Physician Order)  . Calcium Carb-Cholecalciferol (CALCIUM-VITAMIN D3) 600-400 MG-UNIT TABS Take 1 tablet by mouth 2 (two) times daily.  . Cholecalciferol (VITAMIN D) 2000 units tablet Take 2,000 Units by mouth daily.   . divalproex (DEPAKOTE SPRINKLE) 125 MG capsule Take 250 mg by mouth 3 (three) times daily.  Marland Kitchen donepezil (ARICEPT) 10 MG tablet Take 10 mg by mouth at bedtime.   . furosemide (LASIX) 20 MG tablet Take 20 mg by mouth every Monday, Wednesday, and Friday.   . levothyroxine (SYNTHROID) 200 MCG tablet Take 200 mcg by mouth daily before  breakfast. Take with 25 mcg to = 225 mcg  . levothyroxine (SYNTHROID) 25 MCG tablet Take 25 mcg by mouth daily before breakfast. Take with a 200 mcg tab to = 225 mcg  . magnesium hydroxide (MILK OF MAGNESIA) 400 MG/5ML suspension If no BM in 3 days, give 30 cc Milk of Magnesium p.o. x 1 dose in 24 hours as needed (Do not use standing constipation orders for residents with renal failure CFR less than 30. Contact MD for orders) (Physician Order)  . memantine (NAMENDA) 10 MG tablet Take 10 mg by mouth 2 (two) times daily.   . Multiple Vitamin (MULTI-VITAMIN DAILY PO) Take one tablet by mouth once daily  . Nutritional Supplement LIQD Take 120 mLs by mouth in the morning, at noon, and at bedtime. MedPass  . Nutritional Supplements (NUTRITIONAL SUPPLEMENT PO) Take 1 each by mouth daily. Magic Cup to help stabilize weight  . PARoxetine (PAXIL) 10 MG tablet Take 10 mg by mouth daily. For Depression  . sennosides-docusate sodium (SENOKOT-S) 8.6-50 MG tablet Take 2 tablets by mouth daily.   . Skin Protectants, Misc. (MINERIN) CREA Apply topically to ankles twice a day for dry skin.  . Sodium Phosphates (RA SALINE ENEMA RE) If not relieved by Biscodyl suppository, give disposable Saline Enema rectally X 1 dose/24 hrs as needed (Do not use constipation standing orders for residents with renal failure/CFR less than 30. Contact MD for orders)(Physician Or   No facility-administered encounter medications on file as of 08/22/2020.    Review of Systems  GENERAL: No change in appetite, no fatigue, no weight changes, no fever, chills or weakness MOUTH and THROAT: Denies oral discomfort, gingival pain or bleeding RESPIRATORY: no cough, SOB, DOE, wheezing, hemoptysis CARDIAC: No chest pain, edema or palpitations GI: No abdominal pain, diarrhea, constipation, heart burn, nausea or vomiting GU: Denies dysuria, frequency, hematuria, incontinence, or discharge MUSCULOSKELETAL: Denies joint pain, muscle pain, back pain,  restricted movement, or unusual weakness CIRCULATION: Denies claudication, edema of legs, varicosities, or cold extremities NEUROLOGICAL: Denies dizziness, syncope, numbness, or headache PSYCHIATRIC: Denies feelings of depression or anxiety. No report of hallucinations, insomnia, paranoia, or agitation   Immunization History  Administered Date(s) Administered  . Influenza-Unspecified 02/17/2016, 08/01/2018, 03/18/2019, 04/07/2020  . Moderna Sars-Covid-2 Vaccination 07/06/2019, 08/03/2019  . PPD Test 04/08/2016  . Pneumococcal Conjugate-13 01/11/2017  . Pneumococcal-Unspecified 02/16/2014  . Tdap 02/27/2019  . Unspecified SARS-COV-2 Vaccination 06/21/2020   Pertinent  Health Maintenance Due  Topic Date Due  . FOOT EXAM  04/16/2020  . HEMOGLOBIN A1C  12/30/2020  . INFLUENZA VACCINE  Completed  . PNA vac Low Risk Adult  Completed  . OPHTHALMOLOGY EXAM  Discontinued  . URINE MICROALBUMIN  Discontinued  . DEXA SCAN  Discontinued   Fall Risk  01/16/2019 01/14/2018 01/10/2017 08/07/2016 04/20/2016  Falls in the past year? 1 No No Yes Yes  Number falls in past yr: 1 - -  2 or more 2 or more  Injury with Fall? 0 - - - Yes  Risk Factor Category  - - - - High Fall Risk  Risk for fall due to : History of fall(s);Impaired balance/gait;Impaired mobility - - - -  Follow up Falls evaluation completed;Falls prevention discussed;Education provided - - - -     Vitals:   08/22/20 1633  BP: 130/83  Pulse: 66  Resp: 16  Temp: 97.9 F (36.6 C)  SpO2: 95%  Weight: 167 lb 6.4 oz (75.9 kg)  Height: 5\' 9"  (1.753 m)   Body mass index is 24.72 kg/m.  Physical Exam  GENERAL APPEARANCE: Well nourished. In no acute distress. Normal body habitus SKIN:  Skin is warm and dry.  MOUTH and THROAT: Lips are without lesions. Oral mucosa is moist and without lesions.  RESPIRATORY: Breathing is even & unlabored, BS CTAB CARDIAC: RRR, no murmur,no extra heart sounds, no edema GI: Abdomen soft, normal BS, no  masses, no tenderness EXTREMITIES:  Able to move x4 extremities NEUROLOGICAL: There is no tremor. Speech is clear.  Alert to self, disoriented to time and place PSYCHIATRIC: Affect and behavior are appropriate  Labs reviewed: Recent Labs    12/02/19 0000 07/02/20 0000  NA 143 145  K 4.3 4.3  CL 108 106  CO2 24* 30*  BUN 20 18  CREATININE 0.9 0.8  CALCIUM 9.3 9.1   Recent Labs    12/02/19 0000 07/02/20 0000  AST 21 19  ALT 8 10  ALKPHOS 68 67  ALBUMIN 3.5 3.4*   Recent Labs    10/19/19 0000 07/02/20 0000  WBC 3.6 3.6  NEUTROABS 1 0.70  HGB 12.3 11.9*  HCT 37 35*  PLT 112* 95*   Lab Results  Component Value Date   TSH 3.39 12/02/2019   Lab Results  Component Value Date   HGBA1C 6.0 07/02/2020   Lab Results  Component Value Date   CHOL 144 07/02/2020   HDL 33 (A) 07/02/2020   LDLCALC 88 07/02/2020   TRIG 113 07/02/2020     Assessment/Plan  1. Lower extremity edema -   Continue furosemide  2. Hypothyroidism due to acquired atrophy of thyroid Lab Results  Component Value Date   TSH 3.39 12/02/2019   -    Stable, continue levothyroxine  3. Type 2 diabetes mellitus with stage 2 chronic kidney disease, without long-term current use of insulin (HCC) Lab Results  Component Value Date   HGBA1C 6.0 07/02/2020   -   Diet-controlled  4. Mood disorder with psychosis (HCC) Stable, continue divalproex and aripiprazole  5. Major depression, recurrent, chronic (HCC) -   Mood is stable, continue paroxetine  6. Alzheimer's dementia with behavioral disturbance, unspecified timing of dementia onset (HCC) -Continue donepezil and memantine -Continue supportive care      Family/ staff Communication: Discussed plan of care with resident and charge nurse.  Labs/tests ordered: None  Goals of care:   Long-term care   07/04/2020, DNP, MSN, FNP-BC [facility] [phone] (Monday-Friday 8:00 a.m. - 5:00  p.m.) [phone] (after hours)

## 2020-08-30 LAB — TSH: TSH: 0.01 — AB (ref <=5.90)

## 2020-09-13 LAB — BASIC METABOLIC PANEL
BUN: 20 (ref 4–21)
CO2: 28 — AB (ref 13–22)
Chloride: 108 (ref 99–108)
Creatinine: 0.7 (ref <=1.1)
Glucose: 97
Potassium: 4.3 (ref 3.4–5.3)
Sodium: 142 (ref 137–147)

## 2020-09-13 LAB — COMPREHENSIVE METABOLIC PANEL: Calcium: 8.4 — AB (ref 8.7–10.7)

## 2020-09-23 ENCOUNTER — Non-Acute Institutional Stay (SKILLED_NURSING_FACILITY): Payer: Medicare Other | Admitting: Adult Health

## 2020-09-23 ENCOUNTER — Encounter: Payer: Self-pay | Admitting: Adult Health

## 2020-09-23 DIAGNOSIS — F0281 Dementia in other diseases classified elsewhere with behavioral disturbance: Secondary | ICD-10-CM

## 2020-09-23 DIAGNOSIS — E1122 Type 2 diabetes mellitus with diabetic chronic kidney disease: Secondary | ICD-10-CM | POA: Diagnosis not present

## 2020-09-23 DIAGNOSIS — F339 Major depressive disorder, recurrent, unspecified: Secondary | ICD-10-CM | POA: Diagnosis not present

## 2020-09-23 DIAGNOSIS — E034 Atrophy of thyroid (acquired): Secondary | ICD-10-CM | POA: Diagnosis not present

## 2020-09-23 DIAGNOSIS — E782 Mixed hyperlipidemia: Secondary | ICD-10-CM

## 2020-09-23 DIAGNOSIS — N182 Chronic kidney disease, stage 2 (mild): Secondary | ICD-10-CM

## 2020-09-23 DIAGNOSIS — G309 Alzheimer's disease, unspecified: Secondary | ICD-10-CM

## 2020-09-23 NOTE — Progress Notes (Signed)
Location:  [facility] Room Number: 214/B Place of Service:  SNF (31) Provider:  Kenard Gower, DNP, FNP-BC  Patient Care Team: Pecola Lawless, MD as PCP - General (Internal Medicine)  Extended Emergency Contact Information Primary Emergency Contact: Robinson,Roberto Address: [address]          [place], [place] [address] [place] Home Phone: [phone] Relation: Son Secondary Emergency Contact: Reid,Cynthia  [place] Mobile Phone: [phone] Relation: Daughter  Code Status:  Full Code  Goals of care: Advanced Directive information Advanced Directives 09/23/2020  Does Patient Have a Medical Advance Directive? Yes  Type of Advance Directive Healthcare Power of Attorney  Does patient want to make changes to medical advance directive? No - Patient declined  Copy of Healthcare Power of Attorney in Chart? Yes - validated most recent copy scanned in chart (See row information)  Would patient like information on creating a medical advance directive? -  Pre-existing out of facility DNR order (yellow form or pink MOST form) -     Chief Complaint  Patient presents with  . Medical Management of Chronic Issues    Routine Visit of Medical Management     HPI:  Pt is a 86 y.o. female seen today for medical management of chronic diseases. She is a long-term care resident of [facility]. She has a PMH of dementia, hypothyroidism and hypertension. She takes Levothyroxine 225 mcg daily for hypothyroidism. Latest hgbA1C 6.0, 07/02/20. She is not on any medication for diabetes.    Past Medical History:  Diagnosis Date  . Chronic kidney disease    Stage III  . Dementia with behavioral disturbance (HCC) 03/19/2015   Notated on FL2 Form from Dr. Dreama Saa [phone]  . Depression    previously saw Dr Donell Beers  . Diabetes mellitus without complication (HCC)    11/18/15 A1c 6%  . Dyslipidemia     11/18/15 Tg 74, HDL 60, LDL 90 on statin  . Hypertension   . Hypothyroidism    11/18/15 TSH 53.6, 02/07/16 TSH 9.42 @ [facility]  . MDD (major depressive disorder)    With behavioral disturbance  . Scabies    02/21/16 [facility],[place]; Rx: Permethrin cream  . Urinary tract infection due to Proteus 05/04/2019   Uniformly sensitive except to nitrofurantoin; Cipro prescribed  . Vitamin D deficiency    11/18/15 vitamin D 25-hydroxy 39   Past Surgical History:  Procedure Laterality Date  . no record     04/10/15 [facility] note: see old chart    No Known Allergies  Outpatient Encounter Medications as of 09/23/2020  Medication Sig  . acetaminophen (TYLENOL) 325 MG tablet Take 650 mg by mouth every 6 (six) hours as needed for mild pain or moderate pain.  . ARIPiprazole (ABILIFY) 5 MG tablet Take 7.5 mg by mouth daily. 1.5 tablets to = 7.5 mg  . aspirin EC 81 MG tablet Take 81 mg by mouth daily.   Marland Kitchen atorvastatin (LIPITOR) 40 MG tablet Take 40 mg by mouth at bedtime.   . bisacodyl (DULCOLAX) 10 MG suppository If not relieved by MOM, give 10 mg Bisacodyl suppositiory rectally X 1 dose in 24 hours as needed (Do not use constipation standing orders for residents with renal failure/CFR less than 30. Contact MD for orders) (Physician Order)  . Calcium Carb-Cholecalciferol (CALCIUM-VITAMIN D3) 600-400 MG-UNIT TABS Take 1 tablet by mouth 2 (two) times daily.  . Cholecalciferol (VITAMIN D) 2000 units tablet  Take 2,000 Units by mouth daily.   . divalproex (DEPAKOTE SPRINKLE) 125 MG capsule Take 250 mg by mouth 3 (three) times daily.  Marland Kitchen donepezil (ARICEPT) 10 MG tablet Take 10 mg by mouth at bedtime.   . furosemide (LASIX) 20 MG tablet Take 20 mg by mouth every Monday, Wednesday, and Friday.   . levothyroxine (SYNTHROID) 200 MCG tablet Take 200 mcg by mouth daily before breakfast. Take with 25 mcg to = 225 mcg  . levothyroxine (SYNTHROID) 25 MCG tablet Take 25 mcg by mouth daily before breakfast.  Take with a 200 mcg tab to = 225 mcg  . LORazepam (ATIVAN) 2 MG tablet Take 2 mg by mouth. 1 hour prior to dental appointment  . magnesium hydroxide (MILK OF MAGNESIA) 400 MG/5ML suspension If no BM in 3 days, give 30 cc Milk of Magnesium p.o. x 1 dose in 24 hours as needed (Do not use standing constipation orders for residents with renal failure CFR less than 30. Contact MD for orders) (Physician Order)  . memantine (NAMENDA) 10 MG tablet Take 10 mg by mouth 2 (two) times daily.   . Multiple Vitamin (MULTI-VITAMIN DAILY PO) Take one tablet by mouth once daily  . Nutritional Supplement LIQD Take 120 mLs by mouth in the morning, at noon, and at bedtime. MedPass  . Nutritional Supplements (NUTRITIONAL SUPPLEMENT PO) Take 1 each by mouth daily. Magic Cup to help stabilize weight  . PARoxetine (PAXIL) 10 MG tablet Take 10 mg by mouth daily. For Depression  . sennosides-docusate sodium (SENOKOT-S) 8.6-50 MG tablet Take 2 tablets by mouth daily.   . Skin Protectants, Misc. (MINERIN) CREA Apply topically to ankles twice a day for dry skin.  . Sodium Phosphates (RA SALINE ENEMA RE) If not relieved by Biscodyl suppository, give disposable Saline Enema rectally X 1 dose/24 hrs as needed (Do not use constipation standing orders for residents with renal failure/CFR less than 30. Contact MD for orders)(Physician Or   No facility-administered encounter medications on file as of 09/23/2020.    Review of Systems  Unable to obtain due to dementia    Immunization History  Administered Date(s) Administered  . Influenza-Unspecified 02/17/2016, 08/01/2018, 03/18/2019, 04/07/2020  . Moderna Sars-Covid-2 Vaccination 07/06/2019, 08/03/2019  . PPD Test 04/08/2016  . Pneumococcal Conjugate-13 01/11/2017  . Pneumococcal-Unspecified 02/16/2014  . Tdap 02/27/2019  . Unspecified SARS-COV-2 Vaccination 06/21/2020   Pertinent  Health Maintenance Due  Topic Date Due  . HEMOGLOBIN A1C  12/30/2020  . FOOT EXAM   01/12/2021  . INFLUENZA VACCINE  01/16/2021  . PNA vac Low Risk Adult  Completed  . OPHTHALMOLOGY EXAM  Discontinued  . URINE MICROALBUMIN  Discontinued  . DEXA SCAN  Discontinued   Fall Risk  01/16/2019 01/14/2018 01/10/2017 08/07/2016 04/20/2016  Falls in the past year? 1 No No Yes Yes  Number falls in past yr: 1 - - 2 or more 2 or more  Injury with Fall? 0 - - - Yes  Risk Factor Category  - - - - High Fall Risk  Risk for fall due to : History of fall(s);Impaired balance/gait;Impaired mobility - - - -  Follow up Falls evaluation completed;Falls prevention discussed;Education provided - - - -     Vitals:   09/23/20 1522  BP: 130/83  Pulse: 66  Resp: 16  Temp: 97.9 F (36.6 C)  SpO2: 95%  Weight: 166 lb (75.3 kg)  Height: 5\' 9"  (1.753 m)   Body mass index is 24.51 kg/m.  Physical Exam  GENERAL APPEARANCE: Well nourished. In no acute distress. Normal body habitus SKIN:  Skin is warm and dry.  MOUTH and THROAT: Lips are without lesions. Oral mucosa is moist and without lesions. Tongue is normal in shape, size, and color and without lesions RESPIRATORY: Breathing is even & unlabored, BS CTAB CARDIAC: RRR, no murmur,no extra heart sounds GI: Abdomen soft, normal BS, no masses, no tenderness NEUROLOGICAL: There is no tremor. Speech is clear. Alert to self, disoriented to time and place. PSYCHIATRIC:  Affect and behavior are appropriate  Labs reviewed: Recent Labs    12/02/19 0000 07/02/20 0000  NA 143 145  K 4.3 4.3  CL 108 106  CO2 24* 30*  BUN 20 18  CREATININE 0.9 0.8  CALCIUM 9.3 9.1   Recent Labs    12/02/19 0000 07/02/20 0000  AST 21 19  ALT 8 10  ALKPHOS 68 67  ALBUMIN 3.5 3.4*   Recent Labs    10/19/19 0000 07/02/20 0000  WBC 3.6 3.6  NEUTROABS 1 0.70  HGB 12.3 11.9*  HCT 37 35*  PLT 112* 95*   Lab Results  Component Value Date   TSH 3.39 12/02/2019   Lab Results  Component Value Date   HGBA1C 6.0 07/02/2020   Lab Results  Component  Value Date   CHOL 144 07/02/2020   HDL 33 (A) 07/02/2020   LDLCALC 88 07/02/2020   TRIG 113 07/02/2020     Assessment/Plan  1. Hypothyroidism due to acquired atrophy of thyroid -   Continue levothyroxine  2. Type 2 diabetes mellitus with stage 2 chronic kidney disease, without long-term current use of insulin (HCC) Lab Results  Component Value Date   HGBA1C 6.0 07/02/2020   -   Diet controlled  3. Mixed hyperlipidemia Lab Results  Component Value Date   CHOL 144 07/02/2020   HDL 33 (A) 07/02/2020   LDLCALC 88 07/02/2020   TRIG 113 07/02/2020   -Continue Lipitor  4. Major depression, recurrent, chronic (HCC) -   Continue aripiprazole and paroxetine -   Follows-up with psych NP  6.  Alzheimer's dementia -  BIMS score 0/15, ranging in severe cognitive impairment -  Continue memantine and donepezil -   Continue supportive care    Family/ staff Communication: Discussed plan of care with charge nurse.  Labs/tests ordered: None  Goals of care:   Long-term care   Kenard Gower, DNP, MSN, FNP-BC [facility] [phone] (Monday-Friday 8:00 a.m. - 5:00 p.m.) [phone] (after hours)

## 2020-09-27 ENCOUNTER — Non-Acute Institutional Stay (SKILLED_NURSING_FACILITY): Payer: Medicare Other | Admitting: Adult Health

## 2020-09-27 ENCOUNTER — Encounter: Payer: Self-pay | Admitting: Adult Health

## 2020-09-27 DIAGNOSIS — F39 Unspecified mood [affective] disorder: Secondary | ICD-10-CM | POA: Diagnosis not present

## 2020-09-27 DIAGNOSIS — F0281 Dementia in other diseases classified elsewhere with behavioral disturbance: Secondary | ICD-10-CM | POA: Diagnosis not present

## 2020-09-27 DIAGNOSIS — G309 Alzheimer's disease, unspecified: Secondary | ICD-10-CM
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
## 2020-10-12 ENCOUNTER — Encounter: Payer: Self-pay | Admitting: Adult Health

## 2020-10-12 ENCOUNTER — Non-Acute Institutional Stay (SKILLED_NURSING_FACILITY): Payer: Medicare Other | Admitting: Adult Health

## 2020-10-12 DIAGNOSIS — E034 Atrophy of thyroid (acquired): Secondary | ICD-10-CM

## 2020-10-12 DIAGNOSIS — Z7189 Other specified counseling: Secondary | ICD-10-CM

## 2020-10-12 DIAGNOSIS — R600 Localized edema: Secondary | ICD-10-CM | POA: Diagnosis not present

## 2020-10-12 DIAGNOSIS — F339 Major depressive disorder, recurrent, unspecified: Secondary | ICD-10-CM

## 2020-10-12 DIAGNOSIS — F0281 Dementia in other diseases classified elsewhere with behavioral disturbance: Secondary | ICD-10-CM

## 2020-10-12 DIAGNOSIS — F39 Unspecified mood [affective] disorder: Secondary | ICD-10-CM

## 2020-10-12 DIAGNOSIS — E1122 Type 2 diabetes mellitus with diabetic chronic kidney disease: Secondary | ICD-10-CM

## 2020-10-12 DIAGNOSIS — N182 Chronic kidney disease, stage 2 (mild): Secondary | ICD-10-CM

## 2020-10-12 DIAGNOSIS — G309 Alzheimer's disease, unspecified: Secondary | ICD-10-CM

## 2020-10-12 NOTE — Progress Notes (Signed)
Location:  [facility] Room Number: 214/B Place of Service:  SNF (31) Provider:  Kenard Gower, DNP, FNP-BC  Patient Care Team: Pecola Lawless, MD as PCP - General (Internal Medicine)  Extended Emergency Contact Information Primary Emergency Contact: Robinson,Roberto Address: [address]          [place], [place] [address] [place] Home Phone: [phone] Relation: Son Secondary Emergency Contact: Reid,Cynthia  [place] Mobile Phone: [phone] Relation: Daughter  Code Status: Full Code   Goals of care: Advanced Directive information Advanced Directives 10/12/2020  Does Patient Have a Medical Advance Directive? Yes  Type of Advance Directive Healthcare Power of Attorney  Does patient want to make changes to medical advance directive? No - Patient declined  Copy of Healthcare Power of Attorney in Chart? Yes - validated most recent copy scanned in chart (See row information)  Would patient like information on creating a medical advance directive? -  Pre-existing out of facility DNR order (yellow form or pink MOST form) -     Chief Complaint  Patient presents with  . Acute Visit    Care Plan Meeting     HPI:  Pt is a 86 y.o. female seen today for care plan meeting. The meeting was attended by MDS Coordinator, Child psychotherapist and NP. Responsible party was called but did not answer. Resident was invited to the meeting but declined. She remains to be full code. Discussed medications, vital signs and weights. Staff reported having occasional agitations during care. She participates in restorative eating/swallowing wherein she is verbally cued to sit up during meals to complete her meals.  She sometimes becomes combative and had to be removed.  Latest BIMS score 2/15, ranging in severe cognitive impairment.  Depakote dosage was recently increased to 250 mg twice daily and 500 mg at bedtime.  Latest weight is 163.6 lbs,  stable.  She is now changed to monthly weights.  She is encouraged to participate in activities of her choice.  The meeting lasted for 20 minutes.  Past Medical History:  Diagnosis Date  . Chronic kidney disease    Stage III  . Dementia with behavioral disturbance (HCC) 03/19/2015   Notated on FL2 Form from Dr. Dreama Saa [phone]  . Depression    previously saw Dr Donell Beers  . Diabetes mellitus without complication (HCC)    11/18/15 A1c 6%  . Dyslipidemia    11/18/15 Tg 74, HDL 60, LDL 90 on statin  . Hypertension   . Hypothyroidism    11/18/15 TSH 53.6, 02/07/16 TSH 9.42 @ [facility]  . MDD (major depressive disorder)    With behavioral disturbance  . Scabies    02/21/16 [facility],[place]; Rx: Permethrin cream  . Urinary tract infection due to Proteus 05/04/2019   Uniformly sensitive except to nitrofurantoin; Cipro prescribed  . Vitamin D deficiency    11/18/15 vitamin D 25-hydroxy 39   Past Surgical History:  Procedure Laterality Date  . no record     04/10/15 [facility] note: see old chart    No Known Allergies  Outpatient Encounter Medications as of 10/12/2020  Medication Sig  . acetaminophen (TYLENOL) 325 MG tablet Take 650 mg by mouth every 6 (six) hours as needed for mild pain or moderate pain.  . ARIPiprazole (ABILIFY) 5 MG tablet Take 7.5 mg by mouth daily. 1.5 tablets to = 7.5 mg  . aspirin EC 81 MG tablet Take 81 mg by mouth daily.   Marland Kitchen  atorvastatin (LIPITOR) 40 MG tablet Take 40 mg by mouth at bedtime.   . bisacodyl (DULCOLAX) 10 MG suppository If not relieved by MOM, give 10 mg Bisacodyl suppositiory rectally X 1 dose in 24 hours as needed (Do not use constipation standing orders for residents with renal failure/CFR less than 30. Contact MD for orders) (Physician Order)  . Calcium Carb-Cholecalciferol (CALCIUM-VITAMIN D3) 600-400 MG-UNIT TABS Take 1 tablet by mouth 2 (two) times daily.  . Cholecalciferol (VITAMIN D) 2000 units tablet Take 2,000 Units by mouth  daily.   . divalproex (DEPAKOTE SPRINKLE) 125 MG capsule SPRNK TAKE 2 CAPSULES (250MG ) BY MOUTH TWICE A DAY FOR MOOD DISORDER (DO NOT CRUSH);TAKE 4 CAPSULES (500MG ) BY MOUTH AT BEDTIME FOR MOOD DISORDER (DO NOT CRUSH)  . donepezil (ARICEPT) 10 MG tablet Take 10 mg by mouth at bedtime.   . furosemide (LASIX) 20 MG tablet Take 20 mg by mouth every Monday, Wednesday, and Friday.   . levothyroxine (SYNTHROID) 200 MCG tablet Take 200 mcg by mouth daily before breakfast.  . magnesium hydroxide (MILK OF MAGNESIA) 400 MG/5ML suspension If no BM in 3 days, give 30 cc Milk of Magnesium p.o. x 1 dose in 24 hours as needed (Do not use standing constipation orders for residents with renal failure CFR less than 30. Contact MD for orders) (Physician Order)  . memantine (NAMENDA) 10 MG tablet Take 10 mg by mouth 2 (two) times daily.   . Multiple Vitamin (MULTI-VITAMIN DAILY PO) Take one tablet by mouth once daily  . Nutritional Supplement LIQD Take 120 mLs by mouth in the morning, at noon, and at bedtime. MedPass  . Nutritional Supplements (NUTRITIONAL SUPPLEMENT PO) Take 1 each by mouth daily. Magic Cup to help stabilize weight  . PARoxetine (PAXIL) 10 MG tablet Take 10 mg by mouth daily. For Depression  . sennosides-docusate sodium (SENOKOT-S) 8.6-50 MG tablet Take 2 tablets by mouth daily.   . Skin Protectants, Misc. (MINERIN) CREA Apply topically to ankles twice a day for dry skin.  . Sodium Phosphates (RA SALINE ENEMA RE) If not relieved by Biscodyl suppository, give disposable Saline Enema rectally X 1 dose/24 hrs as needed (Do not use constipation standing orders for residents with renal failure/CFR less than 30. Contact MD for orders)(Physician Or  . [DISCONTINUED] levothyroxine (SYNTHROID) 25 MCG tablet Take 25 mcg by mouth daily before breakfast. Take with a 200 mcg tab to = 225 mcg  . [DISCONTINUED] LORazepam (ATIVAN) 2 MG tablet Take 2 mg by mouth. 1 hour prior to dental appointment   No  facility-administered encounter medications on file as of 10/12/2020.    Review of Systems unable to obtain due to dementia    Immunization History  Administered Date(s) Administered  . Influenza-Unspecified 02/17/2016, 08/01/2018, 03/18/2019, 04/07/2020  . Moderna Sars-Covid-2 Vaccination 07/06/2019, 08/03/2019  . PPD Test 04/08/2016  . Pneumococcal Conjugate-13 01/11/2017  . Pneumococcal-Unspecified 02/16/2014  . Tdap 02/27/2019  . Unspecified SARS-COV-2 Vaccination 06/21/2020   Pertinent  Health Maintenance Due  Topic Date Due  . HEMOGLOBIN A1C  12/30/2020  . FOOT EXAM  01/12/2021  . INFLUENZA VACCINE  01/16/2021  . PNA vac Low Risk Adult  Completed  . OPHTHALMOLOGY EXAM  Discontinued  . URINE MICROALBUMIN  Discontinued  . DEXA SCAN  Discontinued   Fall Risk  01/16/2019 01/14/2018 01/10/2017 08/07/2016 04/20/2016  Falls in the past year? 1 No No Yes Yes  Number falls in past yr: 1 - - 2 or more 2 or more  Injury  with Fall? 0 - - - Yes  Risk Factor Category  - - - - High Fall Risk  Risk for fall due to : History of fall(s);Impaired balance/gait;Impaired mobility - - - -  Follow up Falls evaluation completed;Falls prevention discussed;Education provided - - - -     Vitals:   10/12/20 1219  BP: 128/77  Pulse: 74  Resp: 18  Temp: (!) 97 F (36.1 C)  SpO2: 95%  Weight: 163 lb 9.6 oz (74.2 kg)  Height: 5\' 9"  (1.753 m)   Body mass index is 24.16 kg/m.  Physical Exam  GENERAL APPEARANCE: Well nourished. In no acute distress. Normal body habitus SKIN:  Skin is warm and dry.  MOUTH and THROAT: Lips are without lesions. Oral mucosa is moist and without lesions.  RESPIRATORY: Breathing is even & unlabored, BS CTAB CARDIAC: RRR, no murmur,no extra heart sounds GI: Abdomen soft, normal BS, no masses, no tenderness NEUROLOGICAL: There is no tremor. Speech is clear.  Alert to self, disoriented to time and place. PSYCHIATRIC:  Affect and behavior are appropriate  Labs  reviewed: Recent Labs    12/02/19 0000 07/02/20 0000  NA 143 145  K 4.3 4.3  CL 108 106  CO2 24* 30*  BUN 20 18  CREATININE 0.9 0.8  CALCIUM 9.3 9.1   Recent Labs    12/02/19 0000 07/02/20 0000  AST 21 19  ALT 8 10  ALKPHOS 68 67  ALBUMIN 3.5 3.4*   Recent Labs    10/19/19 0000 07/02/20 0000  WBC 3.6 3.6  NEUTROABS 1 0.70  HGB 12.3 11.9*  HCT 37 35*  PLT 112* 95*   Lab Results  Component Value Date   TSH 3.39 12/02/2019   Lab Results  Component Value Date   HGBA1C 6.0 07/02/2020   Lab Results  Component Value Date   CHOL 144 07/02/2020   HDL 33 (A) 07/02/2020   LDLCALC 88 07/02/2020   TRIG 113 07/02/2020     Assessment/Plan  1. Advance care planning -Remains to be full code -Discussed medications, vital signs and weights  2. Hypothyroidism due to acquired atrophy of thyroid Lab Results  Component Value Date   TSH 3.39 12/02/2019   -Continue levothyroxine  3. Lower extremity edema -Continue Lasix -Elevate lower extremities at night when in bed  4. Major depression, recurrent, chronic (HCC) -   Continue paroxetine  5. Mood disorder with psychosis (HCC) -Continue Depakote and aripiprazole -Followed by psych NP  6. Type 2 diabetes mellitus with stage 2 chronic kidney disease, without long-term current use of insulin (HCC) Lab Results  Component Value Date   HGBA1C 6.0 07/02/2020   -Diet controlled  7. Alzheimer's dementia with behavioral disturbance, unspecified timing of dementia onset (HCC) -  BIMS score 2/15, ranging in severe cognitive impairment -   Continue memantine and donepezil -   Continue supportive care     Family/ staff Communication:   Discussed plan of care with IDT.  Labs/tests ordered: None  Goals of care: Long-term care   3/15, DNP, MSN, FNP-BC [facility] [phone] (Monday-Friday 8:00 a.m. - 5:00 p.m.) [phone] (after hours)

## 2020-10-21 LAB — CBC AND DIFFERENTIAL
HCT: 38 (ref 36–46)
Hemoglobin: 12.5 (ref 12.0–16.0)
Platelets: 132 — AB (ref 150–399)
WBC: 3.2

## 2020-10-21 LAB — CBC: RBC: 4.07 (ref 3.87–5.11)

## 2020-10-25 ENCOUNTER — Non-Acute Institutional Stay (SKILLED_NURSING_FACILITY): Payer: Medicare Other | Admitting: Internal Medicine

## 2020-10-25 ENCOUNTER — Encounter: Payer: Self-pay | Admitting: Internal Medicine

## 2020-10-25 DIAGNOSIS — F0281 Dementia in other diseases classified elsewhere with behavioral disturbance: Secondary | ICD-10-CM | POA: Diagnosis not present

## 2020-10-25 DIAGNOSIS — I10 Essential (primary) hypertension: Secondary | ICD-10-CM | POA: Diagnosis not present

## 2020-10-25 DIAGNOSIS — G308 Other Alzheimer's disease: Secondary | ICD-10-CM
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
## 2020-10-25 NOTE — Addendum Note (Signed)
Addended by: Elveria Royals on: 10/25/2020 04:34 PM   Modules accepted: Kipp Brood

## 2020-10-25 NOTE — Progress Notes (Deleted)
This encounter was created in error - please disregard.
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
## 2020-10-25 NOTE — Patient Instructions (Signed)
See assessment and plan under each diagnosis in the problem list and acutely for this visit 

## 2020-10-25 NOTE — Assessment & Plan Note (Signed)
Blood pressure today was actually low at 97/50.  Most days she will not allow vital signs to be taken.

## 2020-12-02 ENCOUNTER — Non-Acute Institutional Stay (SKILLED_NURSING_FACILITY): Payer: Medicare Other | Admitting: Adult Health

## 2020-12-02 ENCOUNTER — Encounter: Payer: Self-pay | Admitting: Adult Health

## 2020-12-02 DIAGNOSIS — G308 Other Alzheimer's disease: Secondary | ICD-10-CM

## 2020-12-02 DIAGNOSIS — I10 Essential (primary) hypertension: Secondary | ICD-10-CM

## 2020-12-02 DIAGNOSIS — E1122 Type 2 diabetes mellitus with diabetic chronic kidney disease: Secondary | ICD-10-CM

## 2020-12-02 DIAGNOSIS — F339 Major depressive disorder, recurrent, unspecified: Secondary | ICD-10-CM | POA: Diagnosis not present

## 2020-12-02 DIAGNOSIS — F02818 Dementia in other diseases classified elsewhere, unspecified severity, with other behavioral disturbance: Secondary | ICD-10-CM

## 2020-12-02 DIAGNOSIS — E034 Atrophy of thyroid (acquired): Secondary | ICD-10-CM

## 2020-12-02 DIAGNOSIS — F0281 Dementia in other diseases classified elsewhere with behavioral disturbance: Secondary | ICD-10-CM

## 2020-12-02 DIAGNOSIS — N182 Chronic kidney disease, stage 2 (mild): Secondary | ICD-10-CM

## 2020-12-02 NOTE — Progress Notes (Signed)
Location:  [facility] Room Number: 214-B Place of Service:  SNF (31) Provider:  Kenard Gower, DNP, FNP-BC  Patient Care Team: Pecola Lawless, MD as PCP - General (Internal Medicine)  Extended Emergency Contact Information Primary Emergency Contact: Robinson,Roberto Address: [address]          [place], [place] [address] [place] Home Phone: [phone] Relation: Son Secondary Emergency Contact: Reid,Cynthia  [place] Mobile Phone: [phone] Relation: Daughter  Code Status:  Full  Goals of care: Advanced Directive information Advanced Directives 12/02/2020  Does Patient Have a Medical Advance Directive? Yes  Type of Advance Directive Healthcare Power of Attorney  Does patient want to make changes to medical advance directive? No - Patient declined  Copy of Healthcare Power of Attorney in Chart? Yes - validated most recent copy scanned in chart (See row information)  Would patient like information on creating a medical advance directive? -  Pre-existing out of facility DNR order (yellow form or pink MOST form) -     Chief Complaint  Patient presents with   Medical Management of Chronic Issues    Routine Visit    HPI:  Pt is a 86 y.o. female seen today for medical management of chronic diseases. She is a long-term care resident of [facility]. She has a PMH of dementia, hypothyroidism and hypertension. She currently participates in restorative eating and swallowing. Paxil and Abilify were recently discontinued. Seroquel was started for depression.She is sometimes reported to be combative. Last hgbA1C 6.0. She is not on any medication for diabetes. SBPs ranging from 108 to 130. She takes Furosemide 20 mg daily on MWF for hypertension.    Past Medical History:  Diagnosis Date   Chronic kidney disease    Stage III   Dementia with behavioral disturbance (HCC) 03/19/2015   Notated on  FL2 Form from Dr. Dreama Saa [phone]   Depression    previously saw Dr Donell Beers   Diabetes mellitus without complication (HCC)    11/18/15 A1c 6%   Dyslipidemia    11/18/15 Tg 74, HDL 60, LDL 90 on statin   Hypertension    Hypothyroidism    11/18/15 TSH 53.6, 02/07/16 TSH 9.42 @ [facility]   MDD (major depressive disorder)    With behavioral disturbance   Scabies    02/21/16 [facility],[place]; Rx: Permethrin cream   Urinary tract infection due to Proteus 05/04/2019   Uniformly sensitive except to nitrofurantoin; Cipro prescribed   Vitamin D deficiency    11/18/15 vitamin D 25-hydroxy 39   Past Surgical History:  Procedure Laterality Date   no record     04/10/15 [facility] note: see old chart    No Known Allergies  Outpatient Encounter Medications as of 12/02/2020  Medication Sig   aspirin EC 81 MG tablet Take 81 mg by mouth daily.    atorvastatin (LIPITOR) 40 MG tablet Take 40 mg by mouth at bedtime.    bisacodyl (DULCOLAX) 10 MG suppository If not relieved by MOM, give 10 mg Bisacodyl suppositiory rectally X 1 dose in 24 hours as needed (Do not use constipation standing orders for residents with renal failure/CFR less than 30. Contact MD for orders) (Physician Order)   Calcium Carb-Cholecalciferol (CALCIUM-VITAMIN D3) 600-400 MG-UNIT TABS Take 1 tablet by mouth 2 (two) times daily.   Cholecalciferol (VITAMIN D) 2000 units tablet Take 2,000 Units by mouth daily.    divalproex (DEPAKOTE SPRINKLE) 125 MG capsule SPRNK  TAKE 2 CAPSULES (250MG ) BY MOUTH TWICE A DAY FOR MOOD DISORDER (DO NOT CRUSH);TAKE 4 CAPSULES (500MG ) BY MOUTH AT BEDTIME FOR MOOD DISORDER (DO NOT CRUSH)   donepezil (ARICEPT) 10 MG tablet Take 10 mg by mouth at bedtime.    furosemide (LASIX) 20 MG tablet Take 20 mg by mouth every Monday, Wednesday, and Friday.    levothyroxine (SYNTHROID) 200 MCG tablet Take 200 mcg by mouth daily before breakfast.   LORazepam (ATIVAN) 0.5 MG tablet Take 0.25 mg by mouth  every 8 (eight) hours. Give 1/2 tablet by mouth one hour before GENTIC TESTING   magnesium hydroxide (MILK OF MAGNESIA) 400 MG/5ML suspension If no BM in 3 days, give 30 cc Milk of Magnesium p.o. x 1 dose in 24 hours as needed (Do not use standing constipation orders for residents with renal failure CFR less than 30. Contact MD for orders) (Physician Order)   memantine (NAMENDA) 10 MG tablet Take 10 mg by mouth 2 (two) times daily.    Multiple Vitamin (MULTI-VITAMIN DAILY PO) Take one tablet by mouth once daily   Nutritional Supplement LIQD Take 120 mLs by mouth in the morning, at noon, and at bedtime. MedPass   Nutritional Supplements (NUTRITIONAL SUPPLEMENT PO) Take 1 each by mouth daily. Magic Cup to help stabilize weight   PARoxetine (PAXIL) 10 MG tablet Take 10 mg by mouth daily. For Depression   sennosides-docusate sodium (SENOKOT-S) 8.6-50 MG tablet Take 2 tablets by mouth daily.    Skin Protectants, Misc. (MINERIN) CREA Apply topically to ankles twice a day for dry skin.   Sodium Phosphates (RA SALINE ENEMA RE) If not relieved by Biscodyl suppository, give disposable Saline Enema rectally X 1 dose/24 hrs as needed (Do not use constipation standing orders for residents with renal failure/CFR less than 30. Contact MD for orders)(Physician Or   [DISCONTINUED] acetaminophen (TYLENOL) 325 MG tablet Take 650 mg by mouth every 6 (six) hours as needed for mild pain or moderate pain.   [DISCONTINUED] ARIPiprazole (ABILIFY) 5 MG tablet Take 7.5 mg by mouth daily. 1.5 tablets to = 7.5 mg   No facility-administered encounter medications on file as of 12/02/2020.    Review of Systems  Unable to obtain due to dementia.   Immunization History  Administered Date(s) Administered   Influenza-Unspecified 02/17/2016, 08/01/2018, 03/18/2019, 04/07/2020   Moderna Sars-Covid-2 Vaccination 07/06/2019, 08/03/2019   PPD Test 04/08/2016   Pneumococcal Conjugate-13 01/11/2017   Pneumococcal-Unspecified  02/16/2014   Tdap 02/27/2019   Unspecified SARS-COV-2 Vaccination 06/21/2020   Pertinent  Health Maintenance Due  Topic Date Due   HEMOGLOBIN A1C  12/30/2020   FOOT EXAM  01/12/2021   INFLUENZA VACCINE  01/16/2021   PNA vac Low Risk Adult  Completed   OPHTHALMOLOGY EXAM  Discontinued   URINE MICROALBUMIN  Discontinued   DEXA SCAN  Discontinued   Fall Risk  01/16/2019 01/14/2018 01/10/2017 08/07/2016 04/20/2016  Falls in the past year? 1 No No Yes Yes  Number falls in past yr: 1 - - 2 or more 2 or more  Injury with Fall? 0 - - - Yes  Risk Factor Category  - - - - High Fall Risk  Risk for fall due to : History of fall(s);Impaired balance/gait;Impaired mobility - - - -  Follow up Falls evaluation completed;Falls prevention discussed;Education provided - - - -     Vitals:   12/02/20 1619  BP: 108/65  Pulse: 63  Resp: 16  Temp: 97.7 F (36.5 C)  Height: 5'  9" (1.753 m)   Body mass index is 23.54 kg/m.  Physical Exam  GENERAL APPEARANCE: Well nourished. In no acute distress. Normal body habitus SKIN:  Skin is warm and dry.  MOUTH and THROAT: Lips are without lesions. Oral mucosa is moist and without lesions.  RESPIRATORY: Breathing is even & unlabored, BS CTAB CARDIAC: RRR, no murmur,no extra heart sounds, no edema GI: Abdomen soft, normal BS, no masses, no tenderness EXTREMITIES:  Able to move X 4 extremities NEUROLOGICAL: There is no tremor. Speech is clear. Alert to self, disoriented to time and place. PSYCHIATRIC:  Affect and behavior are appropriate  Labs reviewed: Recent Labs    07/02/20 0000 09/13/20 0000  NA 145 142  K 4.3 4.3  CL 106 108  CO2 30* 28*  BUN 18 20  CREATININE 0.8 0.7  CALCIUM 9.1 8.4*   Recent Labs    07/02/20 0000  AST 19  ALT 10  ALKPHOS 67  ALBUMIN 3.4*   Recent Labs    07/02/20 0000 10/21/20 0000  WBC 3.6 3.2  NEUTROABS 0.70  --   HGB 11.9* 12.5  HCT 35* 38  PLT 95* 132*   Lab Results  Component Value Date   TSH 0.01 (A)  08/30/2020   Lab Results  Component Value Date   HGBA1C 6.0 07/02/2020   Lab Results  Component Value Date   CHOL 144 07/02/2020   HDL 33 (A) 07/02/2020   LDLCALC 88 07/02/2020   TRIG 113 07/02/2020    Significant Diagnostic Results in last 30 days:  No results found.  Assessment/Plan  1. Type 2 diabetes mellitus with stage 2 chronic kidney disease, without long-term current use of insulin (HCC) Lab Results  Component Value Date   HGBA1C 6.0 07/02/2020   -  diet-controlled  2. Hypothyroidism due to acquired atrophy of thyroid -  continue Levothyroxine  3. Essential hypertension -   BPs stable, continue Furosemide  4. Major depression, recurrent, chronic (HCC) -  has occasional combativeness -  continue Seroquel -  followed by psych NP  5. Alzheimer's dementia of other onset with behavioral disturbance (HCC) -  BIMS score 2/15, ranging in severe cognitive impairment -  continue Memantine and Aricept -   continue supportive care    Family/ staff Communication:   Labs/tests ordered:    Goals of care:      Kenard Gower, DNP, MSN, FNP-BC [facility] [phone] (Monday-Friday 8:00 a.m. - 5:00 p.m.) [phone] (after hours)

## 2020-12-23 ENCOUNTER — Non-Acute Institutional Stay (SKILLED_NURSING_FACILITY): Payer: Medicare Other | Admitting: Adult Health

## 2020-12-23 ENCOUNTER — Encounter: Payer: Self-pay | Admitting: Adult Health

## 2020-12-23 DIAGNOSIS — G308 Other Alzheimer's disease: Secondary | ICD-10-CM

## 2020-12-23 DIAGNOSIS — H04123 Dry eye syndrome of bilateral lacrimal glands: Secondary | ICD-10-CM | POA: Diagnosis not present

## 2020-12-23 DIAGNOSIS — F39 Unspecified mood [affective] disorder: Secondary | ICD-10-CM | POA: Diagnosis not present

## 2020-12-23 DIAGNOSIS — F02818 Dementia in other diseases classified elsewhere, unspecified severity, with other behavioral disturbance: Secondary | ICD-10-CM

## 2020-12-23 DIAGNOSIS — R600 Localized edema: Secondary | ICD-10-CM | POA: Diagnosis not present

## 2020-12-23 DIAGNOSIS — E034 Atrophy of thyroid (acquired): Secondary | ICD-10-CM | POA: Diagnosis not present

## 2020-12-23 DIAGNOSIS — F0281 Dementia in other diseases classified elsewhere with behavioral disturbance: Secondary | ICD-10-CM

## 2020-12-23 NOTE — Progress Notes (Signed)
Location:  [facility] Room Number: 214-B Place of Service:  SNF (31) Provider:  Kenard Gower, DNP, FNP-BC  Patient Care Team: Pecola Lawless, MD as PCP - General (Internal Medicine)  Extended Emergency Contact Information Primary Emergency Contact: Robinson,Roberto Address: [address]          [place], [place] [address] [place] Home Phone: [phone] Relation: Son Secondary Emergency Contact: Reid,Cynthia  [place] Mobile Phone: [phone] Relation: Daughter  Code Status: Full   Goals of care: Advanced Directive information Advanced Directives 12/23/2020  Does Patient Have a Medical Advance Directive? No  Type of Advance Directive Healthcare Power of Attorney  Does patient want to make changes to medical advance directive? No - Patient declined  Copy of Healthcare Power of Attorney in Chart? Yes - validated most recent copy scanned in chart (See row information)  Would patient like information on creating a medical advance directive? -  Pre-existing out of facility DNR order (yellow form or pink MOST form) -     Chief Complaint  Patient presents with   Medical Management of Chronic Issues    Routine Visit    HPI:  Pt is a 86 y.o. female seen today for medical management of chronic diseases. She has a PMH of dementia, hypothyroidism and hypertension.  She is a long-term care resident of [facility]. Noted to have redness on bilateral sclerae. No complaints of eye pain. No eye discharge noted. Latest tsh  0.01, low. She takes Levothyroxine 200 mcg daily for hypothyroidism. She is reported to be combative during ADL care. She takes Seroquel 25 mg BID for psychosis and Depakote DR 250 mg at 1 pm for mood disorder.   Past Medical History:  Diagnosis Date   Chronic kidney disease    Stage III   Dementia with behavioral disturbance (HCC) 03/19/2015   Notated on FL2 Form from Dr.  Dreama Saa [phone]   Depression    previously saw Dr Donell Beers   Diabetes mellitus without complication (HCC)    11/18/15 A1c 6%   Dyslipidemia    11/18/15 Tg 74, HDL 60, LDL 90 on statin   Hypertension    Hypothyroidism    11/18/15 TSH 53.6, 02/07/16 TSH 9.42 @ [facility]   MDD (major depressive disorder)    With behavioral disturbance   Scabies    02/21/16 [facility],[place]; Rx: Permethrin cream   Urinary tract infection due to Proteus 05/04/2019   Uniformly sensitive except to nitrofurantoin; Cipro prescribed   Vitamin D deficiency    11/18/15 vitamin D 25-hydroxy 39   Past Surgical History:  Procedure Laterality Date   no record     04/10/15 [facility] note: see old chart    No Known Allergies  Outpatient Encounter Medications as of 12/23/2020  Medication Sig   aspirin EC 81 MG tablet Take 81 mg by mouth daily.    atorvastatin (LIPITOR) 40 MG tablet Take 40 mg by mouth at bedtime.    bisacodyl (DULCOLAX) 10 MG suppository If not relieved by MOM, give 10 mg Bisacodyl suppositiory rectally X 1 dose in 24 hours as needed (Do not use constipation standing orders for residents with renal failure/CFR less than 30. Contact MD for orders) (Physician Order)   Calcium Carb-Cholecalciferol (CALCIUM-VITAMIN D3) 600-400 MG-UNIT TABS Take 1 tablet by mouth 2 (two) times daily.   Cholecalciferol (VITAMIN D) 2000 units tablet Take 2,000 Units by mouth daily.    divalproex (DEPAKOTE  SPRINKLE) 125 MG capsule SPRNK TAKE 2 CAPSULES (250MG ) BY MOUTH TWICE A DAY FOR MOOD DISORDER (DO NOT CRUSH);TAKE 4 CAPSULES (500MG ) BY MOUTH AT BEDTIME FOR MOOD DISORDER (DO NOT CRUSH)   donepezil (ARICEPT) 10 MG tablet Take 10 mg by mouth at bedtime.    furosemide (LASIX) 20 MG tablet Take 20 mg by mouth every Monday, Wednesday, and Friday.    levothyroxine (SYNTHROID) 200 MCG tablet Take 200 mcg by mouth daily before breakfast.   LORazepam (ATIVAN) 0.5 MG tablet Take 0.25 mg by mouth every 8 (eight) hours.  Give 1/2 tablet by mouth one hour before GENTIC TESTING   magnesium hydroxide (MILK OF MAGNESIA) 400 MG/5ML suspension If no BM in 3 days, give 30 cc Milk of Magnesium p.o. x 1 dose in 24 hours as needed (Do not use standing constipation orders for residents with renal failure CFR less than 30. Contact MD for orders) (Physician Order)   memantine (NAMENDA) 10 MG tablet Take 10 mg by mouth 2 (two) times daily.    Multiple Vitamin (MULTI-VITAMIN DAILY PO) Take one tablet by mouth once daily   Nutritional Supplement LIQD Take 120 mLs by mouth in the morning, at noon, and at bedtime. MedPass   Nutritional Supplements (NUTRITIONAL SUPPLEMENT PO) Take 1 each by mouth daily. Magic Cup to help stabilize weight   PARoxetine (PAXIL) 10 MG tablet Take 10 mg by mouth daily. For Depression   QUEtiapine (SEROQUEL) 25 MG tablet Take 25 mg by mouth. GIVE ONE TAB PO BID FOR PSYCHOSIS/MOOD INSTABILITY. MONITOR AND HOLD FOR SEDATION.   sennosides-docusate sodium (SENOKOT-S) 8.6-50 MG tablet Take 2 tablets by mouth daily.    Skin Protectants, Misc. (MINERIN) CREA Apply topically to ankles twice a day for dry skin.   Sodium Phosphates (RA SALINE ENEMA RE) If not relieved by Biscodyl suppository, give disposable Saline Enema rectally X 1 dose/24 hrs as needed (Do not use constipation standing orders for residents with renal failure/CFR less than 30. Contact MD for orders)(Physician Or   No facility-administered encounter medications on file as of 12/23/2020.    Review of Systems  Unable to obtain due to dementia.   Immunization History  Administered Date(s) Administered   Influenza-Unspecified 02/17/2016, 08/01/2018, 03/18/2019, 04/07/2020   Moderna Sars-Covid-2 Vaccination 07/06/2019, 08/03/2019   PPD Test 04/08/2016   Pneumococcal Conjugate-13 01/11/2017   Pneumococcal-Unspecified 02/16/2014   Tdap 02/27/2019   Unspecified SARS-COV-2 Vaccination 06/21/2020   Pertinent  Health Maintenance Due  Topic Date Due    HEMOGLOBIN A1C  12/30/2020   FOOT EXAM  01/12/2021   INFLUENZA VACCINE  01/16/2021   PNA vac Low Risk Adult  Completed   OPHTHALMOLOGY EXAM  Discontinued   URINE MICROALBUMIN  Discontinued   DEXA SCAN  Discontinued   Fall Risk  01/16/2019 01/14/2018 01/10/2017 08/07/2016 04/20/2016  Falls in the past year? 1 No No Yes Yes  Number falls in past yr: 1 - - 2 or more 2 or more  Injury with Fall? 0 - - - Yes  Risk Factor Category  - - - - High Fall Risk  Risk for fall due to : History of fall(s);Impaired balance/gait;Impaired mobility - - - -  Follow up Falls evaluation completed;Falls prevention discussed;Education provided - - - -     Vitals:   12/23/20 1603  BP: (!) 144/81  Pulse: 65  Resp: 17  Temp: (!) 97.3 F (36.3 C)  Weight: 155 lb 6.4 oz (70.5 kg)   Body mass index is 22.95 kg/m.  Physical Exam  GENERAL APPEARANCE: Well nourished. In no acute distress. Normal body habitus SKIN:  Skin is warm and dry.  MOUTH and THROAT: Lips are without lesions. Oral mucosa is moist and without lesions.  RESPIRATORY: Breathing is even & unlabored, BS CTAB CARDIAC: RRR, no murmur,no extra heart sounds, BLE trace edema GI: Abdomen soft, normal BS, no masses, no tenderness NEUROLOGICAL: There is no tremor. Speech is clear. Alert to self, disoriented to time and place. PSYCHIATRIC:  Affect and behavior are appropriate  Labs reviewed: Recent Labs    07/02/20 0000 09/13/20 0000  NA 145 142  K 4.3 4.3  CL 106 108  CO2 30* 28*  BUN 18 20  CREATININE 0.8 0.7  CALCIUM 9.1 8.4*   Recent Labs    07/02/20 0000  AST 19  ALT 10  ALKPHOS 67  ALBUMIN 3.4*   Recent Labs    07/02/20 0000 10/21/20 0000  WBC 3.6 3.2  NEUTROABS 0.70  --   HGB 11.9* 12.5  HCT 35* 38  PLT 95* 132*   Lab Results  Component Value Date   TSH 0.01 (A) 08/30/2020   Lab Results  Component Value Date   HGBA1C 6.0 07/02/2020   Lab Results  Component Value Date   CHOL 144 07/02/2020   HDL 33 (A)  07/02/2020   LDLCALC 88 07/02/2020   TRIG 113 07/02/2020    Significant Diagnostic Results in last 30 days:  No results found.  Assessment/Plan  1. Dry eyes -  will start artificial tears ophthalmic solution instill 2 drops to bilateral eyes twice a day  2. Hypothyroidism due to acquired atrophy of thyroid Lab Results  Component Value Date   TSH 0.01 (A) 08/30/2020   -  will decrease Levothyroxine fromm to 175 mcg daily -    repeat TSH in 6 weeks  3. Lower extremity edema -Stable, continue furosemide  4. Mood disorder with psychosis (HCC) -   reported to have agitations during ADLs -  continue Seroquel and Depakote -  followed by psych NP  5. Alzheimer's dementia of other onset with behavioral disturbance (HCC) -   BIMS score  2/15, ranging in severe cognitive impairment -   Continue memantine and Aricept -   Continue supportive care    Family/ staff Communication: Discussed plan of care with charge nurse.  Labs/tests ordered: TSH in 6 weeks  Goals of care:   Long-term care   Kenard Gower, DNP, MSN, FNP-BC [facility] [phone] (Monday-Friday 8:00 a.m. - 5:00 p.m.) [phone] (after hours)

## 2020-12-26 LAB — BASIC METABOLIC PANEL
BUN: 18 (ref 4–21)
CO2: 28 — AB (ref 13–22)
Chloride: 106 (ref 99–108)
Creatinine: 0.8 (ref 0.5–1.1)
Glucose: 89
Potassium: 4.2 (ref 3.4–5.3)
Sodium: 142 (ref 137–147)

## 2020-12-26 LAB — COMPREHENSIVE METABOLIC PANEL
Albumin: 3.3 — AB (ref 3.5–5.0)
Calcium: 9 (ref 8.7–10.7)
GFR calc Af Amer: 74.1
GFR calc non Af Amer: 63.93
Globulin: 2.2

## 2020-12-26 LAB — HEPATIC FUNCTION PANEL
ALT: 7 (ref 7–35)
AST: 16 (ref 13–35)
Alkaline Phosphatase: 65 (ref 25–125)
Bilirubin, Total: 0.3

## 2020-12-26 LAB — CBC AND DIFFERENTIAL
HCT: 91 — AB (ref 36–46)
Hemoglobin: 12.1 (ref 12.0–16.0)
Neutrophils Absolute: 0.9
Platelets: 86 — AB (ref 150–399)
WBC: 3.2

## 2020-12-26 LAB — CBC: RBC: 3.9 (ref 3.87–5.11)

## 2021-01-04 ENCOUNTER — Encounter: Payer: Self-pay | Admitting: Adult Health

## 2021-01-04 ENCOUNTER — Non-Acute Institutional Stay (SKILLED_NURSING_FACILITY): Payer: Medicare Other | Admitting: Adult Health

## 2021-01-04 DIAGNOSIS — E782 Mixed hyperlipidemia: Secondary | ICD-10-CM

## 2021-01-04 DIAGNOSIS — E034 Atrophy of thyroid (acquired): Secondary | ICD-10-CM | POA: Diagnosis not present

## 2021-01-04 DIAGNOSIS — Z7189 Other specified counseling: Secondary | ICD-10-CM | POA: Diagnosis not present

## 2021-01-04 DIAGNOSIS — G308 Other Alzheimer's disease: Secondary | ICD-10-CM

## 2021-01-04 DIAGNOSIS — F02818 Dementia in other diseases classified elsewhere, unspecified severity, with other behavioral disturbance: Secondary | ICD-10-CM

## 2021-01-04 DIAGNOSIS — F0281 Dementia in other diseases classified elsewhere with behavioral disturbance: Secondary | ICD-10-CM

## 2021-01-04 DIAGNOSIS — F39 Unspecified mood [affective] disorder: Secondary | ICD-10-CM

## 2021-01-04 DIAGNOSIS — R600 Localized edema: Secondary | ICD-10-CM

## 2021-01-04 NOTE — Progress Notes (Signed)
Location:  [facility] Room Number: 214 B Place of Service:  SNF (31) Provider:  Kenard Gower, DNP, FNP-BC  Patient Care Team: Pecola Lawless, MD as PCP - General (Internal Medicine)  Extended Emergency Contact Information Primary Emergency Contact: Robinson,Roberto Address: [address]          [place], [place] [address] [place] of [place] Home Phone: [phone] Relation: Son Secondary Emergency Contact: Reid,Cynthia  [place] Mobile Phone: [phone] Relation: Daughter  Code Status:  FULL CODE  Goals of care: Advanced Directive information Advanced Directives 01/04/2021  Does Patient Have a Medical Advance Directive? Yes  Type of Advance Directive Healthcare Power of Attorney  Does patient want to make changes to medical advance directive? No - Patient declined  Copy of Healthcare Power of Attorney in Chart? Yes - validated most recent copy scanned in chart (See row information)  Would patient like information on creating a medical advance directive? -  Pre-existing out of facility DNR order (yellow form or pink MOST form) -     Chief Complaint  Patient presents with   Follow-up    Care plan meeting    HPI:  Pt is a 86 y.o. female who had a care plan meeting today attended by social worker, NP, life enrichment and son, who attended via telephone conference. She remains to be full code. Discussed medications, vital signs and weights. Latest weight is 164.2 lbs, fluctuating. She gets irritated/frustrated with staff and tries to grab/scratch during ADL care. She takes Depakote DR 125 mg  4 capsules= 500 mg at 9 AM and 9 PM  and 250 mg @ 1PM and Seroquel 37.5 mg BID for Mood Disorder with psychosis. She is being followed by psych NP. The meeting lasted for 20 minutes.   Past Medical History:  Diagnosis Date   Chronic kidney disease    Stage III   Dementia with behavioral disturbance (HCC) 03/19/2015   Notated on  FL2 Form from Dr. Dreama Saa [phone]   Depression    previously saw Dr Donell Beers   Diabetes mellitus without complication (HCC)    11/18/15 A1c 6%   Dyslipidemia    11/18/15 Tg 74, HDL 60, LDL 90 on statin   Hypertension    Hypothyroidism    11/18/15 TSH 53.6, 02/07/16 TSH 9.42 @ [facility]   MDD (major depressive disorder)    With behavioral disturbance   Scabies    02/21/16 [facility],[place]; Rx: Permethrin cream   Urinary tract infection due to Proteus 05/04/2019   Uniformly sensitive except to nitrofurantoin; Cipro prescribed   Vitamin D deficiency    11/18/15 vitamin D 25-hydroxy 39   Past Surgical History:  Procedure Laterality Date   no record     04/10/15 [facility] note: see old chart    No Known Allergies  Outpatient Encounter Medications as of 01/04/2021  Medication Sig   acetaminophen (MAPAP) 325 MG tablet Take 325 mg by mouth every 6 (six) hours as needed. TAKE 2 TABLETS =650MG  BY MOUTH EVERY 6 HOURS AS NEEDED - PAIN   aspirin EC 81 MG tablet Take 81 mg by mouth daily.    atorvastatin (LIPITOR) 40 MG tablet Take 40 mg by mouth at bedtime.    bisacodyl (DULCOLAX) 10 MG suppository If not relieved by MOM, give 10 mg Bisacodyl suppositiory rectally X 1 dose in 24 hours as needed (Do not use constipation standing orders for residents with renal failure/CFR less than 30. Contact MD  for orders) (Physician Order)   Calcium Carb-Cholecalciferol (CALCIUM-VITAMIN D3) 600-400 MG-UNIT TABS Take 1 tablet by mouth 2 (two) times daily.   cholecalciferol (VITAMIN D3) 25 MCG (1000 UNIT) tablet Take 2,000 Units by mouth daily. Take 2 tablet (2000 unit total) by mouth one time daily for vitamin deficiency   divalproex (DEPAKOTE SPRINKLE) 125 MG capsule SPRNK TAKE 2 CAPSULES (250MG ) BY MOUTH TWICE A DAY FOR MOOD DISORDER (DO NOT CRUSH);TAKE 4 CAPSULES (500MG ) BY MOUTH AT BEDTIME FOR MOOD DISORDER (DO NOT CRUSH)   donepezil (ARICEPT) 10 MG tablet Take 10 mg by mouth at bedtime.     furosemide (LASIX) 20 MG tablet Take 20 mg by mouth every Monday, Wednesday, and Friday.    hydroxypropyl methylcellulose / hypromellose (ISOPTO TEARS / GONIOVISC) 2.5 % ophthalmic solution Place 2 drops into both eyes 2 (two) times daily.   levothyroxine (SYNTHROID) 175 MCG tablet Take 175 mcg by mouth daily before breakfast.   magnesium hydroxide (MILK OF MAGNESIA) 400 MG/5ML suspension If no BM in 3 days, give 30 cc Milk of Magnesium p.o. x 1 dose in 24 hours as needed (Do not use standing constipation orders for residents with renal failure CFR less than 30. Contact MD for orders) (Physician Order)   memantine (NAMENDA) 10 MG tablet Take 10 mg by mouth 2 (two) times daily.    Multiple Vitamin (MULTI-VITAMIN DAILY PO) Take one tablet by mouth once daily   Nutritional Supplement LIQD Take 120 mLs by mouth in the morning, at noon, and at bedtime. MedPass   Nutritional Supplements (NUTRITIONAL SUPPLEMENT PO) Take 1 each by mouth daily. Magic Cup to help stabilize weight   PARoxetine (PAXIL) 20 MG tablet Take 20 mg by mouth daily. For Depression   QUEtiapine (SEROQUEL) 25 MG tablet Take 25 mg by mouth. Give 1 1/2 tablet by mouth twice daily(monitor and hold for sedation)   sennosides-docusate sodium (SENOKOT-S) 8.6-50 MG tablet Take 2 tablets by mouth daily.    Skin Protectants, Misc. (MINERIN) CREA Apply topically to ankles twice a day for dry skin.   Sodium Phosphates (RA SALINE ENEMA RE) If not relieved by Biscodyl suppository, give disposable Saline Enema rectally X 1 dose/24 hrs as needed (Do not use constipation standing orders for residents with renal failure/CFR less than 30. Contact MD for orders)(Physician Or   [DISCONTINUED] levothyroxine (SYNTHROID) 200 MCG tablet Take 200 mcg by mouth daily before breakfast.   [DISCONTINUED] LORazepam (ATIVAN) 0.5 MG tablet Take 0.25 mg by mouth every 8 (eight) hours. Give 1/2 tablet by mouth one hour before GENTIC TESTING   No facility-administered  encounter medications on file as of 01/04/2021.    Review of Systems  Unable to obtain due to dementia.   Immunization History  Administered Date(s) Administered   Influenza-Unspecified 02/17/2016, 08/01/2018, 03/18/2019, 04/07/2020   Moderna Sars-Covid-2 Vaccination 07/06/2019, 08/03/2019   PPD Test 04/08/2016   Pneumococcal Conjugate-13 01/11/2017   Pneumococcal-Unspecified 02/16/2014   Tdap 02/27/2019   Unspecified SARS-COV-2 Vaccination 06/21/2020   Pertinent  Health Maintenance Due  Topic Date Due   HEMOGLOBIN A1C  12/30/2020   FOOT EXAM  01/12/2021   INFLUENZA VACCINE  01/16/2021   PNA vac Low Risk Adult  Completed   OPHTHALMOLOGY EXAM  Discontinued   URINE MICROALBUMIN  Discontinued   DEXA SCAN  Discontinued   Fall Risk  01/16/2019 01/14/2018 01/10/2017 08/07/2016 04/20/2016  Falls in the past year? 1 No No Yes Yes  Number falls in past yr: 1 - - 2 or  more 2 or more  Injury with Fall? 0 - - - Yes  Risk Factor Category  - - - - High Fall Risk  Risk for fall due to : History of fall(s);Impaired balance/gait;Impaired mobility - - - -  Follow up Falls evaluation completed;Falls prevention discussed;Education provided - - - -     Vitals:   01/04/21 1023  BP: (!) 143/94  Pulse: 82  Resp: 20  Temp: (!) 97 F (36.1 C)  Weight: 164 lb 3.2 oz (74.5 kg)  Height: 5\' 9"  (1.753 m)   Body mass index is 24.25 kg/m.  Physical Exam  GENERAL APPEARANCE: Well nourished. In no acute distress. Normal body habitus SKIN:  Skin is warm and dry.  MOUTH and THROAT: Lips are without lesions. Oral mucosa is moist and without lesions.  RESPIRATORY: Breathing is even & unlabored, BS CTAB CARDIAC: RRR, no murmur,no extra heart sounds, no edema GI: Abdomen soft, normal BS, no masses, no tenderness NEUROLOGICAL: There is no tremor. Speech is clear. Alert to self, disoriented to time and place. PSYCHIATRIC:  Affect and behavior are appropriate  Labs reviewed: Recent Labs    07/02/20 0000  09/13/20 0000  NA 145 142  K 4.3 4.3  CL 106 108  CO2 30* 28*  BUN 18 20  CREATININE 0.8 0.7  CALCIUM 9.1 8.4*   Recent Labs    07/02/20 0000  AST 19  ALT 10  ALKPHOS 67  ALBUMIN 3.4*   Recent Labs    07/02/20 0000 10/21/20 0000  WBC 3.6 3.2  NEUTROABS 0.70  --   HGB 11.9* 12.5  HCT 35* 38  PLT 95* 132*   Lab Results  Component Value Date   TSH 0.01 (A) 08/30/2020   Lab Results  Component Value Date   HGBA1C 6.0 07/02/2020   Lab Results  Component Value Date   CHOL 144 07/02/2020   HDL 33 (A) 07/02/2020   LDLCALC 88 07/02/2020   TRIG 113 07/02/2020    Significant Diagnostic Results in last 30 days:  No results found.  Assessment/Plan  1. ACP (advance care planning) -   Remains to be full code -   Discussed medications, vital signs and weights  2. Hypothyroidism due to acquired atrophy of thyroid Lab Results  Component Value Date   TSH 0.01 (A) 08/30/2020   -  continue Levothyroxine  3. Lower extremity edema -  continue Furosemide  4. Mood disorder with psychosis (HCC) -   reported to have agitation episodes, continue Depakote and Seroquel -  followed by psych NP  5. Mixed hyperlipidemia Lab Results  Component Value Date   CHOL 144 07/02/2020   HDL 33 (A) 07/02/2020   LDLCALC 88 07/02/2020   TRIG 113 07/02/2020   -   continue Lipitor  6. Alzheimer's dementia of other onset with behavioral disturbance (HCC) -  BIMS score 3/15,  ranging in severe cognitive impairment -  continue Aricept and Memantine -   fall precautions     Family/ staff Communication:  Discussed plan of care with son and IDT.  Labs/tests ordered:  None  Goals of care:  Long-term care    4/15, DNP, MSN, FNP-BC [facility] [phone] (Monday-Friday 8:00 a.m. - 5:00 p.m.) [phone] (after hours)

## 2021-01-24 ENCOUNTER — Non-Acute Institutional Stay (SKILLED_NURSING_FACILITY): Payer: Medicare Other | Admitting: Internal Medicine

## 2021-01-24 ENCOUNTER — Encounter: Payer: Self-pay | Admitting: Internal Medicine

## 2021-01-24 DIAGNOSIS — N182 Chronic kidney disease, stage 2 (mild): Secondary | ICD-10-CM | POA: Diagnosis not present

## 2021-01-24 DIAGNOSIS — G308 Other Alzheimer's disease: Secondary | ICD-10-CM | POA: Diagnosis not present

## 2021-01-24 DIAGNOSIS — E034 Atrophy of thyroid (acquired): Secondary | ICD-10-CM

## 2021-01-24 DIAGNOSIS — F0281 Dementia in other diseases classified elsewhere with behavioral disturbance: Secondary | ICD-10-CM

## 2021-01-24 DIAGNOSIS — F02818 Dementia in other diseases classified elsewhere, unspecified severity, with other behavioral disturbance: Secondary | ICD-10-CM

## 2021-01-24 DIAGNOSIS — D61818 Other pancytopenia: Secondary | ICD-10-CM | POA: Diagnosis not present

## 2021-01-24 LAB — TSH: TSH: 7.79 — AB (ref 0.41–5.90)

## 2021-01-24 NOTE — Assessment & Plan Note (Addendum)
Initially calm today but soon began to kick @ me during exam and began pushing WC away exclaiming " I want to go see my Mama!" ( Note: she is 68!). As she rolls through the hallways she'll attempt to grab & pinch Staff. Psych NP to monitor

## 2021-01-24 NOTE — Assessment & Plan Note (Signed)
Most recent platelet count is only mildly reduced at 132,000; prior value was 95,000.  Staff has not reported new bleeding dyscrasias

## 2021-01-24 NOTE — Assessment & Plan Note (Deleted)
Initially calm but soon began to kick @ me during exam. As she rolls through the hallways she'll attempt to grab & pinch Staff. Psych NP to monitor

## 2021-01-24 NOTE — Progress Notes (Signed)
   NURSING HOME LOCATION:  [facility] ROOM NUMBER:  214 B  CODE STATUS:  Full Code   PCP:  Douglass Rivers MD  This is a nursing facility follow up visit of chronic medical diagnoses & to document compliance with Regulation 483.30 (c) in The Long Term Care Survey Manual Phase 2 which mandates caregiver visit ( visits can alternate among physician, PA or NP as per statutes) within 10 days of 30 days / 60 days/ 90 days post admission to SNF date    Interim medical record and care since last SNF visit was updated with review of diagnostic studies and change in clinical status since last visit were documented.  HPI: She is a permanent resident of this facility with medical diagnoses of CKD, dementia with behavioral disturbances, recurrent depression, diabetes with neurovascular complications, dyslipidemia, essential hypertension, and hypothyroidism.  Review of systems: Dementia precluded completion of review of systems.    Physical exam:  Pertinent or positive findings: She was sitting in the wheelchair finishing her lunch.  She was using her fingers to mop up some liquid residue in a small bowl and then would lick the liquid from her fingers. Initially she seemed to agree to an exam.  Breath sounds are decreased and heart sounds are distant.  As I began to check the feet for pedal pulses she began to kick at me may with her left foot. And exclaiming " I want to go see my Mama!" There is an antiwandering bracelet around the right ankle.  General appearance: Adequately nourished; no acute distress, increased work of breathing is present.   Eyes: No conjunctival inflammation or lid edema is present. There is no scleral icterus. Ears:  External ear exam shows no significant lesions or deformities.   Nose:  External nasal examination shows no deformity or inflammation. Nasal mucosa are pink and moist without lesions, exudates Oral exam:  Lips , gums , & visualized teeth are  healthy appearing. Heart:  Normal rate and regular rhythm. S1 and S2 normal without gallop, murmur, click, rub .  Lungs:  without wheezes, rhonchi, rales, rubs. Abdomen: Bowel sounds are normal. Abdomen is soft and nontender with no organomegaly, hernias, masses. GU: Deferred  Extremities:  No cyanosis, clubbing, edema  Neurologic exam :Balance, Rhomberg, finger to nose testing could not be completed due to clinical state Skin: Warm & dry w/o tenting. No significant lesions or rash.  See summary under each active problem in the Problem List with associated updated therapeutic plan

## 2021-01-24 NOTE — Assessment & Plan Note (Signed)
Most recent A1c 6% and creatinine 0.7. A1c goal = < 8% No change indicated

## 2021-01-24 NOTE — Assessment & Plan Note (Signed)
BP controlled w/o antihypertensive medications  

## 2021-01-25 NOTE — Assessment & Plan Note (Signed)
Current TSH 0.01 on 175 mcg L thyroxine Recheck TSH & adjust L thyroxine dose

## 2021-01-25 NOTE — Assessment & Plan Note (Signed)
Current creatinine 0.8 / GFR 74  ; CKD Stage 2 Medication List reviewed. No nephrotoxic agents identified.

## 2021-01-25 NOTE — Assessment & Plan Note (Signed)
Current H/H 12.1/91! ( error)  Platelet # 86,000 WBC stable @ 3.2 Anemia improved  No bleeding dyscrasias reported by Staff Monitor CBC

## 2021-01-25 NOTE — Patient Instructions (Signed)
See assessment and plan under each diagnosis in the problem list and acutely for this visit 

## 2021-01-27 LAB — CBC AND DIFFERENTIAL
HCT: 37 (ref 36–46)
Hemoglobin: 12.4 (ref 12.0–16.0)
Platelets: 120 — AB (ref 150–399)
WBC: 3.8

## 2021-01-27 LAB — CBC: RBC: 3.97 (ref 3.87–5.11)

## 2021-03-17 ENCOUNTER — Non-Acute Institutional Stay (SKILLED_NURSING_FACILITY): Payer: Medicare Other | Admitting: Adult Health

## 2021-03-17 ENCOUNTER — Encounter: Payer: Self-pay | Admitting: Adult Health

## 2021-03-17 DIAGNOSIS — N182 Chronic kidney disease, stage 2 (mild): Secondary | ICD-10-CM

## 2021-03-17 DIAGNOSIS — E1122 Type 2 diabetes mellitus with diabetic chronic kidney disease: Secondary | ICD-10-CM | POA: Diagnosis not present

## 2021-03-17 DIAGNOSIS — F333 Major depressive disorder, recurrent, severe with psychotic symptoms: Secondary | ICD-10-CM

## 2021-03-17 DIAGNOSIS — E782 Mixed hyperlipidemia: Secondary | ICD-10-CM | POA: Diagnosis not present

## 2021-03-17 DIAGNOSIS — D696 Thrombocytopenia, unspecified: Secondary | ICD-10-CM | POA: Diagnosis not present

## 2021-03-17 DIAGNOSIS — F03918 Unspecified dementia, unspecified severity, with other behavioral disturbance: Secondary | ICD-10-CM

## 2021-03-17 DIAGNOSIS — E034 Atrophy of thyroid (acquired): Secondary | ICD-10-CM | POA: Diagnosis not present

## 2021-03-17 DIAGNOSIS — F0391 Unspecified dementia with behavioral disturbance: Secondary | ICD-10-CM

## 2021-03-17 NOTE — Progress Notes (Signed)
Location:  [facility] Room Number: 214-B Place of Service:  SNF (31) Provider:  Kenard Gower, DNP, FNP-BC  Patient Care Team: Pecola Lawless, MD as PCP - General (Internal Medicine)  Extended Emergency Contact Information Primary Emergency Contact: Robinson,Roberto Address: [address]          [place], [place] [address] [place] Home Phone: [phone] Relation: Son Secondary Emergency Contact: Reid,Cynthia  [place] Mobile Phone: [phone] Relation: Daughter  Code Status: Full code   Goals of care: Advanced Directive information Advanced Directives 03/17/2021  Does Patient Have a Medical Advance Directive? Yes  Type of Advance Directive Healthcare Power of Attorney  Does patient want to make changes to medical advance directive? No - Patient declined  Copy of Healthcare Power of Attorney in Chart? Yes - validated most recent copy scanned in chart (See row information)  Would patient like information on creating a medical advance directive? -  Pre-existing out of facility DNR order (yellow form or pink MOST form) -     Chief Complaint  Patient presents with   Medical Management of Chronic Issues    Routine Visit    HPI:  Pt is a 86 y.o. female seen today for medical management of chronic diseases.  She is a long-term care resident of [facility]. She has a PMH of dementia, hypothyroidism and hypertension. Latest platelet 120. No bruising noted. She was recently started on ABH gel 0.5 mg/12.5 mg / 0.5mg /mL apply to skin TID for agitation. She takes Lexapro 10 mg daily and Quetiapine 50 mg BID for recurrent depression with psychosis.  She takes levothyroxine 175 mcg 1 tab daily for hypothyroidism.   Past Medical History:  Diagnosis Date   Chronic kidney disease    Stage III   Dementia with behavioral disturbance (HCC) 03/19/2015   Notated on FL2 Form from Dr. Dreama Saa  [phone]   Depression    previously saw Dr Donell Beers   Diabetes mellitus without complication (HCC)    11/18/15 A1c 6%   Dyslipidemia    11/18/15 Tg 74, HDL 60, LDL 90 on statin   Hypertension    Hypothyroidism    11/18/15 TSH 53.6, 02/07/16 TSH 9.42 @ [facility]   MDD (major depressive disorder)    With behavioral disturbance   Scabies    02/21/16 [facility],[place]; Rx: Permethrin cream   Urinary tract infection due to Proteus 05/04/2019   Uniformly sensitive except to nitrofurantoin; Cipro prescribed   Vitamin D deficiency    11/18/15 vitamin D 25-hydroxy 39   Past Surgical History:  Procedure Laterality Date   no record     04/10/15 [facility] note: see old chart    No Known Allergies  Outpatient Encounter Medications as of 03/17/2021  Medication Sig   acetaminophen (TYLENOL) 325 MG tablet Take 325 mg by mouth every 6 (six) hours as needed. TAKE 2 TABLETS =650MG  BY MOUTH EVERY 6 HOURS AS NEEDED - PAIN   aspirin EC 81 MG tablet Take 81 mg by mouth daily.    atorvastatin (LIPITOR) 40 MG tablet Take 40 mg by mouth at bedtime.    bisacodyl (DULCOLAX) 10 MG suppository If not relieved by MOM, give 10 mg Bisacodyl suppositiory rectally X 1 dose in 24 hours as needed (Do not use constipation standing orders for residents with renal failure/CFR less than 30. Contact MD for orders) (Physician Order)   Calcium Carb-Cholecalciferol (CALCIUM-VITAMIN D3) 600-400 MG-UNIT TABS Take 1  tablet by mouth 2 (two) times daily.   Carboxymethylcellulose Sodium (ARTIFICIAL TEARS OP) Apply to eye. Instill 2 gtts to both eyes twice daily Dx: dry eyes   cholecalciferol (VITAMIN D3) 25 MCG (1000 UNIT) tablet Take 2,000 Units by mouth daily. Take 2 tablet (2000 unit total) by mouth one time daily for vitamin deficiency   divalproex (DEPAKOTE SPRINKLE) 125 MG capsule SPRNK TAKE 2 CAPSULES (250MG ) BY MOUTH TWICE A DAY FOR MOOD DISORDER (DO NOT CRUSH);TAKE 4 CAPSULES (500MG ) BY MOUTH AT BEDTIME FOR MOOD  DISORDER (DO NOT CRUSH)   donepezil (ARICEPT) 10 MG tablet Take 10 mg by mouth at bedtime.    escitalopram (LEXAPRO) 10 MG tablet Take 10 mg by mouth daily.   furosemide (LASIX) 20 MG tablet Take 20 mg by mouth every Monday, Wednesday, and Friday.    levothyroxine (SYNTHROID) 175 MCG tablet Take 175 mcg by mouth daily before breakfast.   magnesium hydroxide (MILK OF MAGNESIA) 400 MG/5ML suspension If no BM in 3 days, give 30 cc Milk of Magnesium p.o. x 1 dose in 24 hours as needed (Do not use standing constipation orders for residents with renal failure CFR less than 30. Contact MD for orders) (Physician Order)   memantine (NAMENDA) 10 MG tablet Take 10 mg by mouth 2 (two) times daily.    Multiple Vitamin (MULTI-VITAMIN DAILY PO) Take one tablet by mouth once daily   NON FORMULARY DIET: REGULAR, THIN LIQUIDS   Nutritional Supplements (NUTRITIONAL SUPPLEMENT PO) Take 1 each by mouth daily. Magic Cup to help stabilize weight   PRESCRIPTION MEDICATION ABH gel 0.5mg /12.5mg /0.5mg  per 78ml Apply 11ml to no-hairy area of skin TID -behavior disturbance/ mood stability   QUEtiapine (SEROQUEL) 25 MG tablet Take 25 mg by mouth. Give 1 1/2 tablet by mouth twice daily(monitor and hold for sedation)   sennosides-docusate sodium (SENOKOT-S) 8.6-50 MG tablet Take 2 tablets by mouth daily.    Skin Protectants, Misc. (MINERIN) CREA Apply topically to ankles twice a day for dry skin.   Sodium Phosphates (RA SALINE ENEMA RE) If not relieved by Biscodyl suppository, give disposable Saline Enema rectally X 1 dose/24 hrs as needed (Do not use constipation standing orders for residents with renal failure/CFR less than 30. Contact MD for orders)(Physician Or   [DISCONTINUED] LORazepam 1 MG/0.5ML CONC Take 0.5 mg by mouth 2 (two) times daily as needed for anxiety.   No facility-administered encounter medications on file as of 03/17/2021.    Review of Systems unable to obtain due to dementia.    Immunization History   Administered Date(s) Administered   Influenza-Unspecified 02/17/2016, 08/01/2018, 03/18/2019, 04/07/2020   Moderna Sars-Covid-2 Vaccination 07/06/2019, 08/03/2019   PPD Test 04/08/2016   Pneumococcal Conjugate-13 01/11/2017   Pneumococcal-Unspecified 02/16/2014   Tdap 02/27/2019   Unspecified SARS-COV-2 Vaccination 06/21/2020   Pertinent  Health Maintenance Due  Topic Date Due   HEMOGLOBIN A1C  12/30/2020   FOOT EXAM  01/12/2021   INFLUENZA VACCINE  01/16/2021   OPHTHALMOLOGY EXAM  Discontinued   URINE MICROALBUMIN  Discontinued   DEXA SCAN  Discontinued   Fall Risk  01/16/2019 01/14/2018 01/10/2017 08/07/2016 04/20/2016  Falls in the past year? 1 No No Yes Yes  Number falls in past yr: 1 - - 2 or more 2 or more  Injury with Fall? 0 - - - Yes  Risk Factor Category  - - - - High Fall Risk  Risk for fall due to : History of fall(s);Impaired balance/gait;Impaired mobility - - - -  Follow up Falls evaluation completed;Falls prevention discussed;Education provided - - - -     Vitals:   03/17/21 1104  BP: (!) 130/56  Pulse: 72  Resp: 18  Temp: (!) 97.2 F (36.2 C)  Weight: 164 lb 9.6 oz (74.7 kg)  Height: 5\' 9"  (1.753 m)   Body mass index is 24.31 kg/m.  Physical Exam  GENERAL APPEARANCE: Well nourished. In no acute distress. Normal body habitus SKIN:  Skin is warm and dry.  MOUTH and THROAT: Lips are without lesions. Oral mucosa is moist and without lesions.  RESPIRATORY: Breathing is even & unlabored, BS CTAB CARDIAC: RRR, no murmur,no extra heart sounds, no edema GI: Abdomen soft, normal BS, no masses, no tenderness EXTREMITIES: Able to move x4 extremities NEUROLOGICAL: There is no tremor. Speech is clear.  Alert to self, disoriented to time and place PSYCHIATRIC:  Affect and behavior are appropriate  Labs reviewed: Recent Labs    07/02/20 0000 09/13/20 0000 12/26/20 0000  NA 145 142 142  K 4.3 4.3 4.2  CL 106 108 106  CO2 30* 28* 28*  BUN 18 20 18    CREATININE 0.8 0.7 0.8  CALCIUM 9.1 8.4* 9.0   Recent Labs    07/02/20 0000 12/26/20 0000  AST 19 16  ALT 10 7  ALKPHOS 67 65  ALBUMIN 3.4* 3.3*   Recent Labs    07/02/20 0000 10/21/20 0000 12/26/20 0000 01/27/21 0000  WBC 3.6 3.2 3.2 3.8  NEUTROABS 0.70  --  0.90  --   HGB 11.9* 12.5 12.1 12.4  HCT 35* 38 91* 37  PLT 95* 132* 86* 120*   Lab Results  Component Value Date   TSH 7.79 (A) 01/24/2021   Lab Results  Component Value Date   HGBA1C 6.0 07/02/2020   Lab Results  Component Value Date   CHOL 144 07/02/2020   HDL 33 (A) 07/02/2020   LDLCALC 88 07/02/2020   TRIG 113 07/02/2020    Significant Diagnostic Results in last 30 days:  No results found.  Assessment/Plan  1. Thrombocytopenia (HCC) Lab Results  Component Value Date   PLT 120 (A) 01/27/2021   -  no bruising, will monitor  2. Hypothyroidism due to acquired atrophy of thyroid Lab Results  Component Value Date   TSH 7.79 (A) 01/24/2021   -  will continue levothyroxine  3. Mixed hyperlipidemia Lab Results  Component Value Date   CHOL 144 07/02/2020   HDL 33 (A) 07/02/2020   LDLCALC 88 07/02/2020   TRIG 113 07/02/2020   -Continue Lipitor  4. Type 2 diabetes mellitus with stage 2 chronic kidney disease, without long-term current use of insulin (HCC) Lab Results  Component Value Date   HGBA1C 6.0 07/02/2020   -Diet controlled   5. Depression, major, recurrent, severe with psychosis (HCC) -     Mood this is stable, continue Lexapro, Quetiapine and ABH gel -     Followed by psych NP  6. Dementia with behavioral disturbance -  BIMS score 3/15, ranging in severe cognitive impairment -   Continue donepezil and memantine     Family/ staff Communication: Discussed plan of care with charge nurse.  Labs/tests ordered: Lipid panel, BMP with GFR, vitamin D level, CBC, A1c  Goals of care:   Long-term care   07/04/2020, DNP, MSN, FNP-BC [facility] [phone] (Monday-Friday 8:00 a.m. - 5:00 p.m.) [phone] (after hours)

## 2021-04-05 ENCOUNTER — Non-Acute Institutional Stay (SKILLED_NURSING_FACILITY): Payer: Medicare Other | Admitting: Adult Health

## 2021-04-05 ENCOUNTER — Encounter: Payer: Self-pay | Admitting: Adult Health

## 2021-04-05 DIAGNOSIS — E1122 Type 2 diabetes mellitus with diabetic chronic kidney disease: Secondary | ICD-10-CM

## 2021-04-05 DIAGNOSIS — Z7189 Other specified counseling: Secondary | ICD-10-CM

## 2021-04-05 DIAGNOSIS — F03918 Unspecified dementia, unspecified severity, with other behavioral disturbance: Secondary | ICD-10-CM

## 2021-04-05 DIAGNOSIS — E782 Mixed hyperlipidemia: Secondary | ICD-10-CM | POA: Diagnosis not present

## 2021-04-05 DIAGNOSIS — F333 Major depressive disorder, recurrent, severe with psychotic symptoms: Secondary | ICD-10-CM

## 2021-04-05 DIAGNOSIS — Z23 Encounter for immunization: Secondary | ICD-10-CM

## 2021-04-05 DIAGNOSIS — N182 Chronic kidney disease, stage 2 (mild): Secondary | ICD-10-CM

## 2021-04-05 DIAGNOSIS — E034 Atrophy of thyroid (acquired): Secondary | ICD-10-CM

## 2021-04-05 NOTE — Progress Notes (Signed)
Location:  [facility] Room Number: 214 B Place of Service:  SNF (31) Provider:  Kenard Gower, DNP, FNP-BC  Patient Care Team: Pecola Lawless, MD as PCP - General (Internal Medicine)  Extended Emergency Contact Information Primary Emergency Contact: Robinson,Roberto Address: [address]          [place], [place] [address] [place] Home Phone: [phone] Relation: Son Secondary Emergency Contact: Reid,Cynthia  [place] Mobile Phone: [phone] Relation: Daughter  Code Status:  Full Code  Goals of care: Advanced Directive information Advanced Directives 03/17/2021  Does Patient Have a Medical Advance Directive? Yes  Type of Advance Directive Healthcare Power of Attorney  Does patient want to make changes to medical advance directive? No - Patient declined  Copy of Healthcare Power of Attorney in Chart? Yes - validated most recent copy scanned in chart (See row information)  Would patient like information on creating a medical advance directive? -  Pre-existing out of facility DNR order (yellow form or pink MOST form) -     Chief Complaint  Patient presents with   Advance Care Plan    Care Plan Meeting    HPI:  Pt is a 86 y.o. female who had a care plan meeting today attended by MDS coordinator, life enrichment, social worker, NP and April Cox, daughter, who attended via teleconference.  Resident ws invited but declined. She remains to be full code.  Discussed medications, vital signs and weights. She is very social when sitting by the nurses' station. She attends the bible study but doesn't stay. Daughter is happy that she attends the setting hands naily care. Occasionally she swings at staff when agitated. She is currently taking Quetiapine 50 mg BID for depression with psychosis. Weight fluctuates and weight is currently 161.2 lbs. Goal is to at least maintain current weight. Latest BIMS score 3/15, ranging  in severe cognitive impairment. She takes Donepezil 10 mg at bedtime and Memantine 10 mg at bedtime for dementia. The meeting lasted for 20 minutes.   Past Medical History:  Diagnosis Date   Chronic kidney disease    Stage III   Dementia with behavioral disturbance 03/19/2015   Notated on FL2 Form from Dr. Dreama Saa [phone]   Depression    previously saw Dr Donell Beers   Diabetes mellitus without complication (HCC)    11/18/15 A1c 6%   Dyslipidemia    11/18/15 Tg 74, HDL 60, LDL 90 on statin   Hypertension    Hypothyroidism    11/18/15 TSH 53.6, 02/07/16 TSH 9.42 @ [facility]   MDD (major depressive disorder)    With behavioral disturbance   Scabies    02/21/16 [facility],[place]; Rx: Permethrin cream   Urinary tract infection due to Proteus 05/04/2019   Uniformly sensitive except to nitrofurantoin; Cipro prescribed   Vitamin D deficiency    11/18/15 vitamin D 25-hydroxy 39   Past Surgical History:  Procedure Laterality Date   no record     04/10/15 [facility] note: see old chart    No Known Allergies  Outpatient Encounter Medications as of 04/05/2021  Medication Sig   acetaminophen (TYLENOL) 325 MG tablet Take 325 mg by mouth every 6 (six) hours as needed. TAKE 2 TABLETS =650MG  BY MOUTH EVERY 6 HOURS AS NEEDED - PAIN   aspirin EC 81 MG tablet Take 81 mg by mouth daily.    atorvastatin (LIPITOR) 40 MG tablet Take 40 mg by mouth at bedtime.  bisacodyl (DULCOLAX) 10 MG suppository If not relieved by MOM, give 10 mg Bisacodyl suppositiory rectally X 1 dose in 24 hours as needed (Do not use constipation standing orders for residents with renal failure/CFR less than 30. Contact MD for orders) (Physician Order)   Calcium Carb-Cholecalciferol (CALCIUM-VITAMIN D3) 600-400 MG-UNIT TABS Take 1 tablet by mouth 2 (two) times daily.   Carboxymethylcellulose Sodium (ARTIFICIAL TEARS OP) Apply to eye. Instill 2 gtts to both eyes twice daily Dx: dry eyes   cholecalciferol (VITAMIN D3)  25 MCG (1000 UNIT) tablet Take 2,000 Units by mouth daily. Take 2 tablet (2000 unit total) by mouth one time daily for vitamin deficiency   divalproex (DEPAKOTE SPRINKLE) 125 MG capsule SPRNK TAKE 2 CAPSULES (250MG ) BY MOUTH TWICE A DAY FOR MOOD DISORDER (DO NOT CRUSH);TAKE 4 CAPSULES (500MG ) BY MOUTH AT BEDTIME FOR MOOD DISORDER (DO NOT CRUSH)   donepezil (ARICEPT) 10 MG tablet Take 10 mg by mouth at bedtime.    escitalopram (LEXAPRO) 10 MG tablet Take 10 mg by mouth daily.   furosemide (LASIX) 20 MG tablet Take 20 mg by mouth every Monday, Wednesday, and Friday.    levothyroxine (SYNTHROID) 175 MCG tablet Take 175 mcg by mouth daily before breakfast.   magnesium hydroxide (MILK OF MAGNESIA) 400 MG/5ML suspension If no BM in 3 days, give 30 cc Milk of Magnesium p.o. x 1 dose in 24 hours as needed (Do not use standing constipation orders for residents with renal failure CFR less than 30. Contact MD for orders) (Physician Order)   memantine (NAMENDA) 10 MG tablet Take 10 mg by mouth 2 (two) times daily.    Multiple Vitamin (MULTI-VITAMIN DAILY PO) Take one tablet by mouth once daily   NON FORMULARY DIET: REGULAR, THIN LIQUIDS   Nutritional Supplements (NUTRITIONAL SUPPLEMENT PO) Take 1 each by mouth daily. Magic Cup to help stabilize weight   PRESCRIPTION MEDICATION ABH gel 0.5mg /12.5mg /0.5mg  per 69ml Apply 65ml to no-hairy area of skin TID -behavior disturbance/ mood stability   QUEtiapine (SEROQUEL) 25 MG tablet Take 25 mg by mouth. Give 1 1/2 tablet by mouth twice daily(monitor and hold for sedation)   sennosides-docusate sodium (SENOKOT-S) 8.6-50 MG tablet Take 2 tablets by mouth daily.    Skin Protectants, Misc. (MINERIN) CREA Apply topically to ankles twice a day for dry skin.   Sodium Phosphates (RA SALINE ENEMA RE) If not relieved by Biscodyl suppository, give disposable Saline Enema rectally X 1 dose/24 hrs as needed (Do not use constipation standing orders for residents with renal failure/CFR  less than 30. Contact MD for orders)(Physician Or   No facility-administered encounter medications on file as of 04/05/2021.    Review of Systems  Unable to obtain due to dementia.    Immunization History  Administered Date(s) Administered   Influenza-Unspecified 02/17/2016, 08/01/2018, 03/18/2019, 04/07/2020   Moderna Sars-Covid-2 Vaccination 07/06/2019, 08/03/2019   PPD Test 04/08/2016   Pneumococcal Conjugate-13 01/11/2017   Pneumococcal-Unspecified 02/16/2014   Tdap 02/27/2019   Unspecified SARS-COV-2 Vaccination 06/21/2020   Pertinent  Health Maintenance Due  Topic Date Due   HEMOGLOBIN A1C  12/30/2020   FOOT EXAM  01/12/2021   INFLUENZA VACCINE  01/16/2021   OPHTHALMOLOGY EXAM  Discontinued   URINE MICROALBUMIN  Discontinued   DEXA SCAN  Discontinued   Fall Risk  01/16/2019 01/14/2018 01/10/2017 08/07/2016 04/20/2016  Falls in the past year? 1 No No Yes Yes  Number falls in past yr: 1 - - 2 or more 2 or more  Injury  with Fall? 0 - - - Yes  Risk Factor Category  - - - - High Fall Risk  Risk for fall due to : History of fall(s);Impaired balance/gait;Impaired mobility - - - -  Follow up Falls evaluation completed;Falls prevention discussed;Education provided - - - -     Vitals:   04/05/21 1400  BP: 122/74  Pulse: 77  Resp: 17  Temp: (!) 97.4 F (36.3 C)  Weight: 161 lb 3.2 oz (73.1 kg)  Height: 5\' 9"  (1.753 m)   Body mass index is 23.81 kg/m.  Physical Exam  GENERAL APPEARANCE: Well nourished. In no acute distress. Normal body habitus SKIN:  Skin is warm and dry.  MOUTH and THROAT: Lips are without lesions. Oral mucosa is moist and without lesions.  RESPIRATORY: Breathing is even & unlabored, BS CTAB CARDIAC: RRR, no murmur,no extra heart sounds, no edema GI: Abdomen soft, normal BS, no masses, no tenderness EXTREMITIES:  Able to move X 4 extremities NEUROLOGICAL: There is no tremor. Speech is clear. Alert to self, disoriented to time and place. PSYCHIATRIC:   Affect and behavior are appropriate  Labs reviewed: Recent Labs    07/02/20 0000 09/13/20 0000 12/26/20 0000  NA 145 142 142  K 4.3 4.3 4.2  CL 106 108 106  CO2 30* 28* 28*  BUN 18 20 18   CREATININE 0.8 0.7 0.8  CALCIUM 9.1 8.4* 9.0   Recent Labs    07/02/20 0000 12/26/20 0000  AST 19 16  ALT 10 7  ALKPHOS 67 65  ALBUMIN 3.4* 3.3*   Recent Labs    07/02/20 0000 10/21/20 0000 12/26/20 0000 01/27/21 0000  WBC 3.6 3.2 3.2 3.8  NEUTROABS 0.70  --  0.90  --   HGB 11.9* 12.5 12.1 12.4  HCT 35* 38 91* 37  PLT 95* 132* 86* 120*   Lab Results  Component Value Date   TSH 7.79 (A) 01/24/2021   Lab Results  Component Value Date   HGBA1C 6.0 07/02/2020   Lab Results  Component Value Date   CHOL 144 07/02/2020   HDL 33 (A) 07/02/2020   LDLCALC 88 07/02/2020   TRIG 113 07/02/2020    Significant Diagnostic Results in last 30 days:  No results found.  Assessment/Plan  1. Advance care planning -  remains to be full code -  discussed medications, vital signs and weights  2. Type 2 diabetes mellitus with stage 2 chronic kidney disease, without long-term current use of insulin (HCC) Lab Results  Component Value Date   HGBA1C 6.0 07/02/2020   -   diet-controlled -   podiatry consult  3. Hypothyroidism due to acquired atrophy of thyroid Lab Results  Component Value Date   TSH 7.79 (A) 01/24/2021   -  continue Levothyroxine  4. Mixed hyperlipidemia Lab Results  Component Value Date   CHOL 144 07/02/2020   HDL 33 (A) 07/02/2020   LDLCALC 88 07/02/2020   TRIG 113 07/02/2020   -   continue Lipitor  5. Depression, major, recurrent, severe with psychosis (HCC) - PHQ-9 is 0, no depression -  continue Escitalopram -   followed by psych NP  6. Dementia with behavioral disturbance -  BIMS score 3/15, ranging in severe cognitive impairment -  continue Donepezil and Memantine  7.  Need for shingles vaccine -  Shingrix 0.5 ml IM X 1  8.  Need for  pneumonia and flu vaccine -    PPSV 23  0.5 ml vaccine IM X 1 -  Flu vaccine given 03/29/21     Family/ staff Communication:   Discussed plan of care with IDT and daughter.  Labs/tests ordered:  hgbA1C  Goals of care:   Long-term care   Kenard Gower, DNP, MSN, FNP-BC [facility] [phone] (Monday-Friday 8:00 a.m. - 5:00 p.m.) [phone] (after hours)

## 2021-04-13 ENCOUNTER — Non-Acute Institutional Stay (SKILLED_NURSING_FACILITY): Payer: Medicare Other | Admitting: Adult Health

## 2021-04-13 ENCOUNTER — Encounter: Payer: Self-pay | Admitting: Adult Health

## 2021-04-13 DIAGNOSIS — F333 Major depressive disorder, recurrent, severe with psychotic symptoms: Secondary | ICD-10-CM

## 2021-04-13 DIAGNOSIS — N182 Chronic kidney disease, stage 2 (mild): Secondary | ICD-10-CM

## 2021-04-13 DIAGNOSIS — I10 Essential (primary) hypertension: Secondary | ICD-10-CM

## 2021-04-13 DIAGNOSIS — E034 Atrophy of thyroid (acquired): Secondary | ICD-10-CM | POA: Diagnosis not present

## 2021-04-13 DIAGNOSIS — E1122 Type 2 diabetes mellitus with diabetic chronic kidney disease: Secondary | ICD-10-CM

## 2021-04-13 NOTE — Progress Notes (Signed)
Location:  [facility] Room Number: 214 B Place of Service:  SNF (31) Provider:  Kenard Gower, DNP, FNP-BC  Patient Care Team: Pecola Lawless, MD as PCP - General (Internal Medicine)  Extended Emergency Contact Information Primary Emergency Contact: [name],Kelly Address: [address]          [place], [place] [address] D[place] of [place] Home Phone: [phone] Relation: Son Secondary Emergency Contact: Kelly,[name]  [place] Mobile Phone: [phone] Relation: Daughter  Code Status:    Goals of care: Advanced Directive information Advanced Directives 03/17/2021  Does Patient Have a Medical Advance Directive? Yes  Type of Advance Directive Healthcare Power of Attorney  Does patient want to make changes to medical advance directive? No - Patient declined  Copy of Healthcare Power of Attorney in Chart? Yes - validated most recent copy scanned in chart (See row information)  Would patient like information on creating a medical advance directive? -  Pre-existing out of facility DNR order (yellow form or pink MOST form) -     Chief Complaint  Patient presents with   Medical Management of Chronic Issues    Routine Visit    HPI:  Pt is a 86 y.o. [name] seen today for medical management of chronic diseases. She is a long-term care resident of [facility]. She has a PMH of dementia, hypothyroidism and hypertension. SBPs ranging from 130 15-124, with outlier 149.  She does not take any anti-hypertensive medications.  Latest tsh 7.79, 01/24/21.  She takes memantine 10 mg twice a day and donepezil 10 mg at bedtime for dementia.  She takes levothyroxine 175 mcg daily for hypothyroidism. Latest BIMS score 3/15, ranging in severe cognitive impairment.  She takes ABH gel 1 mg/25 mg / 1 mg topically 3 times a day, escitalopram 5 mg / 5 mL give 10 mL daily for depression with psychosis.  PH Q-9 score is 0, no  depression.   Past Medical History:  Diagnosis Date   Chronic kidney disease    Stage III   Dementia with behavioral disturbance 03/19/2015   Notated on FL2 Form from Dr. Dreama Saa [phone]   Depression    previously saw Dr Donell Beers   Diabetes mellitus without complication (HCC)    11/18/15 A1c 6%   Dyslipidemia    11/18/15 Tg 74, HDL 60, LDL 90 on statin   Hypertension    Hypothyroidism    11/18/15 TSH 53.6, 02/07/16 TSH 9.42 @ [facility]   MDD (major depressive disorder)    With behavioral disturbance   Scabies    02/21/16 [facility],[place]; Rx: Permethrin cream   Urinary tract infection due to Proteus 05/04/2019   Uniformly sensitive except to nitrofurantoin; Cipro prescribed   Vitamin D deficiency    11/18/15 vitamin D 25-hydroxy 39   Past Surgical History:  Procedure Laterality Date   no record     04/10/15 [facility] note: see old chart    No Known Allergies  Outpatient Encounter Medications as of 04/13/2021  Medication Sig   acetaminophen (TYLENOL) 325 MG tablet Take 325 mg by mouth every 6 (six) hours as needed. TAKE 2 TABLETS =650MG  BY MOUTH EVERY 6 HOURS AS NEEDED - PAIN   aspirin EC 81 MG tablet Take 81 mg by mouth daily.    atorvastatin (LIPITOR) 40 MG tablet Take 40 mg by mouth at bedtime.    bisacodyl (DULCOLAX) 10 MG suppository If not relieved by MOM, give 10 mg Bisacodyl  suppositiory rectally X 1 dose in 24 hours as needed (Do not use constipation standing orders for residents with renal failure/CFR less than 30. Contact MD for orders) (Physician Order)   Calcium Carb-Cholecalciferol (CALCIUM-VITAMIN D3) 600-400 MG-UNIT TABS Take 1 tablet by mouth 2 (two) times daily.   Carboxymethylcellulose Sodium (ARTIFICIAL TEARS OP) Apply to eye. Instill 2 gtts to both eyes twice daily Dx: dry eyes   cholecalciferol (VITAMIN D3) 25 MCG (1000 UNIT) tablet Take 2,000 Units by mouth daily. Take 2 tablet (2000 unit total) by mouth one time daily for vitamin  deficiency   divalproex (DEPAKOTE SPRINKLE) 125 MG capsule SPRNK TAKE 2 CAPSULES (250MG ) BY MOUTH TWICE A DAY FOR MOOD DISORDER (DO NOT CRUSH);TAKE 4 CAPSULES (500MG ) BY MOUTH AT BEDTIME FOR MOOD DISORDER (DO NOT CRUSH)   donepezil (ARICEPT) 10 MG tablet Take 10 mg by mouth at bedtime.    escitalopram (LEXAPRO) 10 MG tablet Take 10 mg by mouth daily.   furosemide (LASIX) 20 MG tablet Take 20 mg by mouth every Monday, Wednesday, and Friday.    levothyroxine (SYNTHROID) 175 MCG tablet Take 175 mcg by mouth daily before breakfast.   magnesium hydroxide (MILK OF MAGNESIA) 400 MG/5ML suspension If no BM in 3 days, give 30 cc Milk of Magnesium p.o. x 1 dose in 24 hours as needed (Do not use standing constipation orders for residents with renal failure CFR less than 30. Contact MD for orders) (Physician Order)   memantine (NAMENDA) 10 MG tablet Take 10 mg by mouth 2 (two) times daily.    Multiple Vitamin (MULTI-VITAMIN DAILY PO) Take one tablet by mouth once daily   NON FORMULARY DIET: REGULAR, THIN LIQUIDS   Nutritional Supplements (NUTRITIONAL SUPPLEMENT PO) Take 1 each by mouth daily. Magic Cup to help stabilize weight   PRESCRIPTION MEDICATION ABH gel 0.5mg /12.5mg /0.5mg  per 25ml Apply 67ml to no-hairy area of skin TID -behavior disturbance/ mood stability   QUEtiapine (SEROQUEL) 25 MG tablet Take 25 mg by mouth. Give 1 1/2 tablet by mouth twice daily(monitor and hold for sedation)   sennosides-docusate sodium (SENOKOT-S) 8.6-50 MG tablet Take 2 tablets by mouth daily.    Skin Protectants, Misc. (MINERIN) CREA Apply topically to ankles twice a day for dry skin.   Sodium Phosphates (RA SALINE ENEMA RE) If not relieved by Biscodyl suppository, give disposable Saline Enema rectally X 1 dose/24 hrs as needed (Do not use constipation standing orders for residents with renal failure/CFR less than 30. Contact MD for orders)(Physician Or   No facility-administered encounter medications on file as of 04/13/2021.     Review of Systems unable to obtain due to dementia.   Immunization History  Administered Date(s) Administered   Influenza-Unspecified 02/17/2016, 08/01/2018, 03/18/2019, 04/07/2020, 03/29/2021   Moderna Sars-Covid-2 Vaccination 07/06/2019, 08/03/2019   PPD Test 04/08/2016   Pneumococcal Conjugate-13 01/11/2017   Pneumococcal-Unspecified 02/16/2014   Tdap 02/27/2019   Unspecified SARS-COV-2 Vaccination 06/21/2020   Pertinent  Health Maintenance Due  Topic Date Due   HEMOGLOBIN A1C  12/30/2020   FOOT EXAM  01/12/2021   INFLUENZA VACCINE  Completed   OPHTHALMOLOGY EXAM  Discontinued   URINE MICROALBUMIN  Discontinued   DEXA SCAN  Discontinued   Fall Risk 04/20/2016 08/07/2016 01/10/2017 01/14/2018 01/16/2019  Falls in the past year? Yes Yes No No 1  Was there an injury with Fall? Yes - - - 0  Fall Risk Category Calculator - - - - 2  Fall Risk Category - - - - Moderate  Patient  at Risk for Falls Due to - - - - History of fall(s);Impaired balance/gait;Impaired mobility  Fall risk Follow up - - - - Falls evaluation completed;Falls prevention discussed;Education provided     Vitals:   04/13/21 1000  BP: 124/69  Pulse: 63  Resp: 18  Temp: (!) 96.7 F (35.9 C)  Weight: 161 lb 3.2 oz (73.1 kg)  Height: 5\' 9"  (1.753 m)   Body mass index is 23.81 kg/m.  Physical Exam  GENERAL APPEARANCE: Well nourished. In no acute distress. Normal body habitus SKIN:  Skin is warm and dry.  MOUTH and THROAT: Lips are without lesions. Oral mucosa is moist and without lesions.  RESPIRATORY: Breathing is even & unlabored, BS CTAB CARDIAC: RRR, no murmur,no extra heart sounds, no edema GI: Abdomen soft, normal BS, no masses, no tenderness EXTREMITIES: Able to move x4 extremities NEUROLOGICAL: There is no tremor. Speech is clear.  Alert to self, disoriented to time and place. PSYCHIATRIC:  Affect and behavior are appropriate  Labs reviewed: Recent Labs    07/02/20 0000 09/13/20 0000  12/26/20 0000  NA 145 142 142  K 4.3 4.3 4.2  CL 106 108 106  CO2 30* 28* 28*  BUN 18 20 18   CREATININE 0.8 0.7 0.8  CALCIUM 9.1 8.4* 9.0   Recent Labs    07/02/20 0000 12/26/20 0000  AST 19 16  ALT 10 7  ALKPHOS 67 65  ALBUMIN 3.4* 3.3*   Recent Labs    07/02/20 0000 10/21/20 0000 12/26/20 0000 01/27/21 0000  WBC 3.6 3.2 3.2 3.8  NEUTROABS 0.70  --  0.90  --   HGB 11.9* 12.5 12.1 12.4  HCT 35* 38 91* 37  PLT 95* 132* 86* 120*   Lab Results  Component Value Date   TSH 7.79 (A) 01/24/2021   Lab Results  Component Value Date   HGBA1C 6.0 07/02/2020   Lab Results  Component Value Date   CHOL 144 07/02/2020   HDL 33 (A) 07/02/2020   LDLCALC 88 07/02/2020   TRIG 113 07/02/2020    Significant Diagnostic Results in last 30 days:  No results found.  Assessment/Plan  1. Essential hypertension -  BPs  stable, not on any antihypertensive medication  2. Hypothyroidism due to acquired atrophy of thyroid Lab Results  Component Value Date   TSH 7.79 (A) 01/24/2021   -   Continue levothyroxine  3. Type 2 diabetes mellitus with stage 2 chronic kidney disease, without long-term current use of insulin (HCC) Lab Results  Component Value Date   HGBA1C 6.0 07/02/2020   -   Diet controlled  4. Depression, major, recurrent, severe with psychosis (HCC) - PHQ-9 score 0, no depression -   Continue escitalopram, quetiapine and ABH gel     Family/ staff Communication: Discussed plan of care with resident and charge nurse.  Labs/tests ordered: None  Goals of care:   Long-term care   03/26/2021, DNP, MSN, FNP-BC [facility] [phone] (Monday-Friday 8:00 a.m. - 5:00 p.m.) [phone] (after hours)

## 2021-04-14 LAB — HEMOGLOBIN A1C: Hemoglobin A1C: 5.8

## 2021-05-02 ENCOUNTER — Non-Acute Institutional Stay (SKILLED_NURSING_FACILITY): Payer: Medicare Other | Admitting: Internal Medicine

## 2021-05-02 ENCOUNTER — Encounter: Payer: Self-pay | Admitting: Internal Medicine

## 2021-05-02 DIAGNOSIS — D72819 Decreased white blood cell count, unspecified: Secondary | ICD-10-CM | POA: Diagnosis not present

## 2021-05-02 DIAGNOSIS — E034 Atrophy of thyroid (acquired): Secondary | ICD-10-CM

## 2021-05-02 DIAGNOSIS — R7303 Prediabetes: Secondary | ICD-10-CM

## 2021-05-02 DIAGNOSIS — D696 Thrombocytopenia, unspecified: Secondary | ICD-10-CM | POA: Diagnosis not present

## 2021-05-02 DIAGNOSIS — N182 Chronic kidney disease, stage 2 (mild): Secondary | ICD-10-CM | POA: Diagnosis not present

## 2021-05-02 DIAGNOSIS — E46 Unspecified protein-calorie malnutrition: Secondary | ICD-10-CM | POA: Insufficient documentation

## 2021-05-02 DIAGNOSIS — E441 Mild protein-calorie malnutrition: Secondary | ICD-10-CM

## 2021-05-02 NOTE — Patient Instructions (Signed)
See assessment and plan under each diagnosis in the problem list and acutely for this visit 

## 2021-05-02 NOTE — Assessment & Plan Note (Signed)
White blood count ranges 3200-3800 with increased lymphocytes.  Clinically no evidence of any leukemic transformation.

## 2021-05-02 NOTE — Progress Notes (Signed)
   NURSING HOME LOCATION:  [facility] ROOM NUMBER:  214 B  CODE STATUS:  Full Code  PCP:  Douglass Rivers MD  This is a nursing facility follow up visit of chronic medical diagnoses & to document compliance with Regulation 483.30 (c) in The Long Term Care Survey Manual Phase 2 which mandates caregiver visit ( visits can alternate among physician, PA or NP as per statutes) within 10 days of 30 days / 60 days/ 90 days post admission to SNF date    Interim medical record and care since last SNF visit was updated with review of diagnostic studies and change in clinical status since last visit were documented.  HPI: She is a permanent resident of the facility with medical diagnoses of diabetes with CKD stage III; dementia with behavioral disturbances; history of depression; dyslipidemia; essential hypertension; and hypothyroidism.  Hemoglobin A1c was 5.8% on 04/14/2021, prediabetic.  The last CBC and differential on record was 8/12 with a decreased white count of 3800 with increased lymphocytes and decreased platelet count of 120,000.  White count has ranged from 3200 up to 3800.  Platelet count has ranged from 86,000 up to 132,000.  No bleeding dyscrasias reported by staff.  The lymphocytosis is a persistent finding.  There was no anemia.  TSH was elevated at 7.79 on 8/9. Staff had reported intermittent refusal to take the L-thyroxine 175 mcg daily.  The prior TSH had suggested excess supplementation as TSH was 0.01 on 08/30/2020.  Depakote level was low therapeutic at 62 with normal ranges of 50-100.  Review of systems: Dementia invalidated responses.  She was unable to provide any focused answers and could not follow commands reliably.  Physical exam:  Pertinent or positive findings: As is usually the case she was in her wheelchair propelling herself with her feet.  When addressed she opens her eyes widely as if amazed or startled.  There is slight exotropia intermittently of the  right eye.  Dentition appears to be good but she cannot follow command  to open her mouth.  Grade 1/2-3/4 systolic murmur is suggested.  Breath sounds were decreased.  Pedal pulses were decreased.  Strength to opposition seems fairly good; but she would not oppose with her left arm saying "do not know".  General appearance: Adequately nourished; no acute distress, increased work of breathing is present.   Lymphatic: No lymphadenopathy about the head, neck, axilla. Eyes: No conjunctival inflammation or lid edema is present. There is no scleral icterus. Ears:  External ear exam shows no significant lesions or deformities.   Nose:  External nasal examination shows no deformity or inflammation. Nasal mucosa are pink and moist without lesions, exudates Neck:  No thyromegaly, masses, tenderness noted.    Heart:  Normal rate and regular rhythm. S1 and S2 normal without gallop,  click, rub .  Lungs:  without wheezes, rhonchi, rales, rubs. Abdomen: Bowel sounds are normal. Abdomen is soft and nontender with no organomegaly, hernias, masses. GU: Deferred  Extremities:  No cyanosis, clubbing, edema  Neurologic exam :Balance, Rhomberg, finger to nose testing could not be completed due to clinical state Skin: Warm & dry w/o tenting. No significant lesions or rash.  See summary under each active problem in the Problem List with associated updated therapeutic plan

## 2021-05-02 NOTE — Assessment & Plan Note (Addendum)
Platelet count has ranged from a low of 86,000 up to 132,000.  Current platelet count is 120,000.  Continue to monitor for bleeding dyscrasias; none reported by staff.

## 2021-05-02 NOTE — Assessment & Plan Note (Signed)
12/26/2020 creatinine 0.84 with GFR of 74.10 indicating high stage II CKD.  No nephrotoxic drugs identified in med list.

## 2021-05-02 NOTE — Assessment & Plan Note (Signed)
Repeat TSH on present dosage in December.

## 2021-05-02 NOTE — Assessment & Plan Note (Signed)
04/14/2021 hemoglobin A1c is 5.8%, prediabetic.  No change indicated.

## 2021-05-05 ENCOUNTER — Encounter: Payer: Self-pay | Admitting: Adult Health

## 2021-05-05 ENCOUNTER — Non-Acute Institutional Stay (SKILLED_NURSING_FACILITY): Payer: Medicare Other | Admitting: Adult Health

## 2021-05-05 DIAGNOSIS — Z79899 Other long term (current) drug therapy: Secondary | ICD-10-CM

## 2021-05-05 LAB — CBC AND DIFFERENTIAL
HCT: 38 (ref 36–46)
HCT: 38 (ref 36–46)
Hemoglobin: 12.1 (ref 12.0–16.0)
Hemoglobin: 12.1 (ref 12.0–16.0)
Platelets: 160 (ref 150–399)
Platelets: 160 (ref 150–399)
WBC: 5.5
WBC: 5.5

## 2021-05-05 LAB — HEPATIC FUNCTION PANEL
ALT: 16 (ref 7–35)
ALT: 16 (ref 7–35)
AST: 25 (ref 13–35)
AST: 25 (ref 13–35)
Alkaline Phosphatase: 111 (ref 25–125)
Alkaline Phosphatase: 111 (ref 25–125)

## 2021-05-05 LAB — BASIC METABOLIC PANEL
BUN: 13 (ref 4–21)
BUN: 13 (ref 4–21)
CO2: 26 — AB (ref 13–22)
CO2: 26 — AB (ref 13–22)
Chloride: 107 (ref 99–108)
Chloride: 107 (ref 99–108)
Creatinine: 0.9 (ref 0.5–1.1)
Creatinine: 0.9 (ref 0.5–1.1)
Glucose: 133
Glucose: 133
Potassium: 4 (ref 3.4–5.3)
Potassium: 4 (ref 3.4–5.3)
Sodium: 143 (ref 137–147)
Sodium: 143 (ref 137–147)

## 2021-05-05 LAB — COMPREHENSIVE METABOLIC PANEL
Albumin: 3.6 (ref 3.5–5.0)
Calcium: 9.5 (ref 8.7–10.7)
Calcium: 9.5 (ref 8.7–10.7)
GFR calc Af Amer: 66.22
GFR calc non Af Amer: 57.13
Globulin: 2.4

## 2021-05-05 LAB — CBC
RBC: 3.98 (ref 3.87–5.11)
RBC: 3.98 (ref 3.87–5.11)

## 2021-05-05 NOTE — Progress Notes (Signed)
Error

## 2021-05-05 NOTE — Progress Notes (Signed)
Location:  [place] Room Number: 214-B Place of Service:  SNF (31) Provider:  Durenda Age, DNP, FNP-BC  Patient Care Team: Hendricks Limes, MD as PCP - General (Internal Medicine)  Extended Emergency Contact Information Primary Emergency Contact: Robinson,Roberto Address: [address]          [place], [place] [address] [name] of [place] Phone: [phone] Mobile Phone: [phone] Relation: Son Secondary Emergency Contact: Reid,Cynthia  [place] Mobile Phone: [phone] Relation: Daughter  Code Status:  FULL CODE  Goals of care: Advanced Directive information Advanced Directives 05/15/2021  Does Patient Have a Medical Advance Directive? Yes  Type of Advance Directive [place]  Does patient want to make changes to medical advance directive? No - Patient declined  Copy of [place] in Chart? Yes - validated most recent copy scanned in chart (See row information)  Would patient like information on creating a medical advance directive? -  Pre-existing out of facility DNR order (yellow form or pink MOST form) -     Chief Complaint  Patient presents with   Acute Visit    Reported twitching episode.    HPI:  Pt is a 86 y.o. female seen today for an acute visit. She is a long-term care resident of [facility]. Staff reported observing resident to have an episode of facial twitching. She was seen in her room today. No facial twitching noted. She was seen sitting on a geri-chair and pushing herself out of the room using her feet. She is currently taking ABH gel 1mg /25 mg/1 mg applying 1 ml topically TID and  Quetiapine 50 mg BID for psychosis/agitation.      Past Medical History:  Diagnosis Date   Chronic kidney disease    Stage III   Dementia with behavioral disturbance 03/19/2015   Notated on FL2 Form from Dr. Agapito Games [phone]    Depression    previously saw Dr Casimiro Needle   Diabetes mellitus without complication ([place])    99991111 A1c 6%   Dyslipidemia    11/18/15 Tg 74, HDL 60, LDL 90 on statin   Hypertension    Hypothyroidism    11/18/15 TSH 53.6, 02/07/16 TSH 9.42 @ [facility]   MDD (major depressive disorder)    With behavioral disturbance   Scabies    02/21/16 [facility],[place]; Rx: Permethrin cream   Urinary tract infection due to Proteus 05/04/2019   Uniformly sensitive except to nitrofurantoin; Cipro prescribed   Vitamin D deficiency    11/18/15 vitamin D 25-hydroxy 39   Past Surgical History:  Procedure Laterality Date   no record     04/10/15 [facility] note: see old chart    No Known Allergies  Outpatient Encounter Medications as of 05/05/2021  Medication Sig   acetaminophen (TYLENOL) 325 MG tablet Take 325 mg by mouth every 6 (six) hours as needed. TAKE 2 TABLETS =650MG  BY MOUTH EVERY 6 HOURS AS NEEDED - PAIN   aspirin EC 81 MG tablet Take 81 mg by mouth daily.    atorvastatin (LIPITOR) 40 MG tablet Take 40 mg by mouth at bedtime.    bisacodyl (DULCOLAX) 10 MG suppository If not relieved by MOM, give 10 mg Bisacodyl suppositiory rectally X 1 dose in 24 hours as needed (Do not use constipation standing orders for residents with renal failure/CFR less than 30. Contact MD for orders) (Physician Order)   Calcium Carb-Cholecalciferol (CALCIUM-VITAMIN D3) 600-400 MG-UNIT TABS Take  1 tablet by mouth 2 (two) times daily.   Carboxymethylcellulose Sodium (ARTIFICIAL TEARS OP) Apply to eye. Instill 2 gtts to both eyes twice daily Dx: dry eyes   cholecalciferol (VITAMIN D3) 25 MCG (1000 UNIT) tablet Take 2,000 Units by mouth daily. Take 2 tablet (2000 unit total) by mouth one time daily for vitamin deficiency   donepezil (ARICEPT) 10 MG tablet Take 10 mg by mouth at bedtime.    escitalopram (LEXAPRO) 10 MG tablet Take 10 mg by mouth daily.   furosemide (LASIX) 20 MG tablet Take 20 mg by mouth every Monday,  Wednesday, and Friday.    levothyroxine (SYNTHROID) 175 MCG tablet Take 175 mcg by mouth daily before breakfast.   magnesium hydroxide (MILK OF MAGNESIA) 400 MG/5ML suspension If no BM in 3 days, give 30 cc Milk of Magnesium p.o. x 1 dose in 24 hours as needed (Do not use standing constipation orders for residents with renal failure CFR less than 30. Contact MD for orders) (Physician Order)   memantine (NAMENDA) 10 MG tablet Take 10 mg by mouth 2 (two) times daily.    Multiple Vitamin (MULTI-VITAMIN DAILY PO) Take one tablet by mouth once daily   NON FORMULARY DIET: REGULAR, THIN LIQUIDS   Nutritional Supplements (NUTRITIONAL SUPPLEMENT PO) Take 1 each by mouth daily. Magic Cup to help stabilize weight   PRESCRIPTION MEDICATION ABH gel 0.5mg /12.5mg /0.5mg  per 53ml Apply 51ml to no-hairy area of skin TID -behavior disturbance/ mood stability   sennosides-docusate sodium (SENOKOT-S) 8.6-50 MG tablet Take 2 tablets by mouth daily.    Skin Protectants, Misc. (MINERIN) CREA Apply topically to ankles twice a day for dry skin.   Sodium Phosphates (RA SALINE ENEMA RE) If not relieved by Biscodyl suppository, give disposable Saline Enema rectally X 1 dose/24 hrs as needed (Do not use constipation standing orders for residents with renal failure/CFR less than 30. Contact MD for orders)(Physician Or   [DISCONTINUED] QUEtiapine (SEROQUEL) 25 MG tablet Take 25 mg by mouth. Give 1 1/2 tablet by mouth twice daily(monitor and hold for sedation)   No facility-administered encounter medications on file as of 05/05/2021.    Review of Systems  Unable to obtain due to dementia.   Immunization History  Administered Date(s) Administered   Influenza-Unspecified 02/17/2016, 08/01/2018, 03/18/2019, 04/07/2020, 03/29/2021   Moderna Sars-Covid-2 Vaccination 07/06/2019, 08/03/2019   PPD Test 04/08/2016   Pneumococcal Conjugate-13 01/11/2017   Pneumococcal-Unspecified 02/16/2014   Tdap 02/27/2019, 05/09/2021   Unspecified  SARS-COV-2 Vaccination 06/21/2020   Pertinent  Health Maintenance Due  Topic Date Due   FOOT EXAM  01/12/2021   HEMOGLOBIN A1C  10/13/2021   INFLUENZA VACCINE  Completed   OPHTHALMOLOGY EXAM  Discontinued   URINE MICROALBUMIN  Discontinued   DEXA SCAN  Discontinued   Fall Risk 08/07/2016 01/10/2017 01/14/2018 01/16/2019 05/09/2021  Falls in the past year? Yes No No 1 -  Was there an injury with Fall? - - - 0 -  Fall Risk Category Calculator - - - 2 -  Fall Risk Category - - - Moderate -  Patient Fall Risk Level - - - - High fall risk  Patient at Risk for Falls Due to - - - History of fall(s);Impaired balance/gait;Impaired mobility -  Fall risk Follow up - - - Falls evaluation completed;Falls prevention discussed;Education provided -     Vitals:   05/05/21 1516  BP: (!) 146/62  Pulse: 67  Resp: 18  Temp: (!) 97.5 F (36.4 C)  Height: 5\' 9"  (1.753 m)  Body mass index is 23.81 kg/m.  Physical Exam  GENERAL APPEARANCE: Well nourished. In no acute distress. Normal body habitus SKIN:  Skin is warm and dry.  MOUTH and THROAT: Lips are without lesions. Oral mucosa is moist and without lesions.  RESPIRATORY: Breathing is even & unlabored, BS CTAB CARDIAC: RRR, no murmur,no extra heart sounds, no edema GI: Abdomen soft, normal BS, no masses, no tenderness EXTREMITIES:  Able to move X 4 extremities NEUROLOGICAL: There is no tremor. Speech is clear. Alert to self, disoriented to time and place. PSYCHIATRIC:  Affect and behavior are appropriate  Labs reviewed: Recent Labs    12/26/20 0000 05/05/21 0000   NA 142 143  143   K 4.2 4.0  4.0   CL 106 107  107   CO2 28* 26*  26*   GLUCOSE  --   --    BUN 18 13  13    CREATININE 0.8 0.9  0.9   CALCIUM 9.0 9.5  9.5   MG  --   --     Recent Labs    12/26/20 0000 05/05/21 0000   AST 16 25  25    ALT 7 16  16    ALKPHOS 65 111  111   BILITOT  --   --    PROT  --   --    ALBUMIN 3.3* 3.6    Recent Labs     07/02/20 0000 10/21/20 0000 12/26/20 0000 01/27/21 0000 05/05/21 0000   WBC 3.6   < > 3.2 3.8 5.5  5.5   NEUTROABS 0.70  --  0.90  --   --    HGB 11.9*   < > 12.1 12.4 12.1  12.1   HCT 35*   < > 91* 37 38  38   MCV  --   --   --   --   --    PLT 95*   < > 86* 120* 160  160    < > = values in this interval not displayed.     Lab Results  Component Value Date   HGBA1C 5.8 04/14/2021   Lab Results  Component Value Date   CHOL 144 07/02/2020   HDL 33 (A) 07/02/2020   LDLCALC 88 07/02/2020   TRIG 113 07/02/2020     Assessment/Plan  1. Medication management -  will decrease ABH gel 1mg /25 mg/1 mg applying 1 ml topically TID to BID and continue Quetiapine 50 mg BID for agitation/psychosis -  monitor for for any facial twitching    Family/ staff Communication:  Discussed plan of care with charge nurse.  Labs/tests ordered:  None  Goals of care:   Long-term care   Durenda Age, DNP, MSN, FNP-BC [facility] [phone] (Monday-Friday 8:00 a.m. - 5:00 p.m.) [phone] (after hours)

## 2021-05-09 ENCOUNTER — Non-Acute Institutional Stay (SKILLED_NURSING_FACILITY): Payer: Medicare Other | Admitting: Adult Health

## 2021-05-09 ENCOUNTER — Emergency Department (HOSPITAL_COMMUNITY): Payer: Medicare Other

## 2021-05-09 ENCOUNTER — Emergency Department (HOSPITAL_COMMUNITY)
Admission: EM | Admit: 2021-05-09 | Discharge: 2021-05-09 | Disposition: A | Discharge status: Home / Self Care | Payer: Medicare Other | Attending: Emergency Medicine | Admitting: Emergency Medicine

## 2021-05-09 ENCOUNTER — Encounter: Payer: Self-pay | Admitting: Adult Health

## 2021-05-09 DIAGNOSIS — I129 Hypertensive chronic kidney disease with stage 1 through stage 4 chronic kidney disease, or unspecified chronic kidney disease: Secondary | ICD-10-CM | POA: Insufficient documentation

## 2021-05-09 DIAGNOSIS — N390 Urinary tract infection, site not specified: Secondary | ICD-10-CM

## 2021-05-09 DIAGNOSIS — Z79899 Other long term (current) drug therapy: Secondary | ICD-10-CM | POA: Insufficient documentation

## 2021-05-09 DIAGNOSIS — N183 Chronic kidney disease, stage 3 unspecified: Secondary | ICD-10-CM | POA: Diagnosis not present

## 2021-05-09 DIAGNOSIS — M25522 Pain in left elbow: Secondary | ICD-10-CM | POA: Insufficient documentation

## 2021-05-09 DIAGNOSIS — R410 Disorientation, unspecified: Secondary | ICD-10-CM | POA: Insufficient documentation

## 2021-05-09 DIAGNOSIS — Z20822 Contact with and (suspected) exposure to covid-19: Secondary | ICD-10-CM | POA: Diagnosis not present

## 2021-05-09 DIAGNOSIS — E1122 Type 2 diabetes mellitus with diabetic chronic kidney disease: Secondary | ICD-10-CM | POA: Diagnosis not present

## 2021-05-09 DIAGNOSIS — M25512 Pain in left shoulder: Secondary | ICD-10-CM | POA: Diagnosis not present

## 2021-05-09 DIAGNOSIS — W19XXXA Unspecified fall, initial encounter: Secondary | ICD-10-CM

## 2021-05-09 DIAGNOSIS — N3000 Acute cystitis without hematuria: Secondary | ICD-10-CM | POA: Diagnosis not present

## 2021-05-09 DIAGNOSIS — F03918 Unspecified dementia, unspecified severity, with other behavioral disturbance: Secondary | ICD-10-CM | POA: Insufficient documentation

## 2021-05-09 DIAGNOSIS — R296 Repeated falls: Secondary | ICD-10-CM | POA: Insufficient documentation

## 2021-05-09 DIAGNOSIS — Z87891 Personal history of nicotine dependence: Secondary | ICD-10-CM | POA: Diagnosis not present

## 2021-05-09 DIAGNOSIS — W1839XA Other fall on same level, initial encounter: Secondary | ICD-10-CM | POA: Diagnosis not present

## 2021-05-09 DIAGNOSIS — R451 Restlessness and agitation: Secondary | ICD-10-CM

## 2021-05-09 DIAGNOSIS — L989 Disorder of the skin and subcutaneous tissue, unspecified: Secondary | ICD-10-CM | POA: Diagnosis not present

## 2021-05-09 DIAGNOSIS — Z23 Encounter for immunization: Secondary | ICD-10-CM | POA: Insufficient documentation

## 2021-05-09 DIAGNOSIS — E039 Hypothyroidism, unspecified: Secondary | ICD-10-CM | POA: Diagnosis not present

## 2021-05-09 DIAGNOSIS — Z7982 Long term (current) use of aspirin: Secondary | ICD-10-CM | POA: Diagnosis not present

## 2021-05-09 LAB — URINALYSIS, MICROSCOPIC (REFLEX)

## 2021-05-09 LAB — COMPREHENSIVE METABOLIC PANEL
ALT: 19 U/L (ref 0–44)
AST: 32 U/L (ref 15–41)
Albumin: 3.5 g/dL (ref 3.5–5.0)
Alkaline Phosphatase: 80 U/L (ref 38–126)
Anion gap: 8 (ref 5–15)
BUN: 15 mg/dL (ref 8–23)
CO2: 26 mmol/L (ref 22–32)
Calcium: 9 mg/dL (ref 8.9–10.3)
Chloride: 105 mmol/L (ref 98–111)
Creatinine, Ser: 1.06 mg/dL — ABNORMAL HIGH (ref 0.44–1.00)
GFR, Estimated: 52 mL/min — ABNORMAL LOW (ref >60)
Glucose, Bld: 100 mg/dL — ABNORMAL HIGH (ref 70–99)
Potassium: 4 mmol/L (ref 3.5–5.1)
Sodium: 139 mmol/L (ref 135–145)
Total Bilirubin: 0.3 mg/dL (ref 0.3–1.2)
Total Protein: 6.6 g/dL (ref 6.5–8.1)

## 2021-05-09 LAB — CBC
HCT: 39.4 % (ref 36.0–46.0)
Hemoglobin: 13 g/dL (ref 12.0–15.0)
MCH: 31.1 pg (ref 26.0–34.0)
MCHC: 33 g/dL (ref 30.0–36.0)
MCV: 94.3 fL (ref 80.0–100.0)
Platelets: 205 10*3/uL (ref 150–400)
RBC: 4.18 MIL/uL (ref 3.87–5.11)
RDW: 12.5 % (ref 11.5–15.5)
WBC: 4.7 10*3/uL (ref 4.0–10.5)
nRBC: 0 % (ref 0.0–0.2)

## 2021-05-09 LAB — RESP PANEL BY RT-PCR (FLU A&B, COVID) ARPGX2
Influenza A by PCR: NEGATIVE
Influenza B by PCR: NEGATIVE
SARS Coronavirus 2 by RT PCR: NEGATIVE

## 2021-05-09 LAB — URINALYSIS, ROUTINE W REFLEX MICROSCOPIC
Bilirubin Urine: NEGATIVE
Glucose, UA: NEGATIVE mg/dL
Hgb urine dipstick: NEGATIVE
Ketones, ur: NEGATIVE mg/dL
Leukocytes,Ua: NEGATIVE
Nitrite: POSITIVE — AB
Protein, ur: NEGATIVE mg/dL
Specific Gravity, Urine: 1.02 (ref 1.005–1.030)
pH: 8.5 — ABNORMAL HIGH (ref 5.0–8.0)

## 2021-05-09 LAB — TSH: TSH: 0.731 u[IU]/mL (ref 0.350–4.500)

## 2021-05-09 LAB — MAGNESIUM: Magnesium: 1.9 mg/dL (ref 1.7–2.4)

## 2021-05-09 MED ORDER — ACETAMINOPHEN 325 MG PO TABS
650.0000 mg | ORAL_TABLET | Freq: Once | ORAL | Status: AC
  Administered 2021-05-09: 650 mg via ORAL
  Filled 2021-05-09: qty 2

## 2021-05-09 MED ORDER — TETANUS-DIPHTH-ACELL PERTUSSIS 5-2.5-18.5 LF-MCG/0.5 IM SUSY
0.5000 mL | PREFILLED_SYRINGE | Freq: Once | INTRAMUSCULAR | Status: AC
  Administered 2021-05-09: 0.5 mL via INTRAMUSCULAR
  Filled 2021-05-09: qty 0.5

## 2021-05-09 MED ORDER — CEPHALEXIN 500 MG PO CAPS: 500.0000 mg | ORAL_CAPSULE | Freq: Four times a day (QID) | ORAL | 0 refills | Status: DC

## 2021-05-09 MED ORDER — LORAZEPAM 2 MG/ML IJ SOLN
0.5000 mg | Freq: Once | INTRAMUSCULAR | Status: AC
  Administered 2021-05-09: 0.5 mg via INTRAVENOUS
  Filled 2021-05-09: qty 1

## 2021-05-09 MED ORDER — SODIUM CHLORIDE 0.9 % IV SOLN
1.0000 g | Freq: Once | INTRAVENOUS | Status: AC
  Administered 2021-05-09: 1 g via INTRAVENOUS
  Filled 2021-05-09: qty 10

## 2021-05-09 MED ORDER — CEPHALEXIN 500 MG PO CAPS: 500.0000 mg | ORAL_CAPSULE | Freq: Four times a day (QID) | ORAL | 0 refills | Status: AC

## 2021-05-09 NOTE — ED Notes (Signed)
Patient changed into clean brief. Patient bed linen also changed. Pericare provided.

## 2021-05-09 NOTE — Progress Notes (Signed)
Location:  Financial [facility] of Service:  SNF (31) Provider:  Durenda Age, DNP, FNP-BC  Patient Care Team: Hendricks Limes, MD as PCP - General (Internal Medicine)  Extended Emergency Contact Information Primary Emergency Contact: Robinson,Roberto Address: [address]          [place], [place] [address] [name] of [place] Phone: [phone] Relation: Son Secondary Emergency Contact: Reid,Cynthia  [place] Mobile Phone: [phone] Relation: Daughter  Code Status:    Goals of care: Advanced Directive information Advanced Directives 05/05/2021  Does Patient Have a Medical Advance Directive? Yes  Type of Advance Directive [place]  Does patient want to make changes to medical advance directive? No - Patient declined  Copy of [place] in Chart? Yes - validated most recent copy scanned in chart (See row information)  Would patient like information on creating a medical advance directive? -  Pre-existing out of facility DNR order (yellow form or pink MOST form) -     Chief Complaint  Patient presents with   Acute Visit    Agitation and UTI    HPI:  Pt is a 86 y.o. female seen today for an acute visit. She had a fall yesterday and sustained a small  laceration on her right lateral orbital area. Staff reported that she  got up from her geri-chair and fell. She is agitated and tries to grab staff.  Yesterday, urine culture came back and showing E.coli 100,000 CFU/ml lactose fermenting gram negative rods. No reported fever nor chills. She was ordered  to start on cipro 500 mg BID X 7 days. Today, she fell again trying to get up from her geri-chair. She is currently taking Quetiapine 50 mg BID  and ABH gel 1 mg/25 mg/1 mg BID for psychosis.   Past Medical History:  Diagnosis Date   Chronic kidney disease    Stage III   Dementia with behavioral disturbance 03/19/2015   Notated on FL2 Form  from Dr. Agapito Games [phone]   Depression    previously saw Dr Casimiro Needle   Diabetes mellitus without complication ([place])    99991111 A1c 6%   Dyslipidemia    11/18/15 Tg 74, HDL 60, LDL 90 on statin   Hypertension    Hypothyroidism    11/18/15 TSH 53.6, 02/07/16 TSH 9.42 @ [facility]   MDD (major depressive disorder)    With behavioral disturbance   Scabies    02/21/16 [facility],[place],; Rx: Permethrin cream   Urinary tract infection due to Proteus 05/04/2019   Uniformly sensitive except to nitrofurantoin; Cipro prescribed   Vitamin D deficiency    11/18/15 vitamin D 25-hydroxy 39   Past Surgical History:  Procedure Laterality Date   no record     04/10/15 [facility] note: see old chart    No Known Allergies  Outpatient Encounter Medications as of 05/09/2021  Medication Sig   acetaminophen (TYLENOL) 325 MG tablet Take 325 mg by mouth every 6 (six) hours as needed. TAKE 2 TABLETS =650MG  BY MOUTH EVERY 6 HOURS AS NEEDED - PAIN   aspirin EC 81 MG tablet Take 81 mg by mouth daily.    atorvastatin (LIPITOR) 40 MG tablet Take 40 mg by mouth at bedtime.    bisacodyl (DULCOLAX) 10 MG suppository If not relieved by MOM, give 10 mg Bisacodyl suppositiory rectally X 1 dose in 24 hours as needed (Do not use constipation standing orders for residents with renal  failure/CFR less than 30. Contact MD for orders) (Physician Order)   Calcium Carb-Cholecalciferol (CALCIUM-VITAMIN D3) 600-400 MG-UNIT TABS Take 1 tablet by mouth 2 (two) times daily.   Carboxymethylcellulose Sodium (ARTIFICIAL TEARS OP) Apply to eye. Instill 2 gtts to both eyes twice daily Dx: dry eyes   cholecalciferol (VITAMIN D3) 25 MCG (1000 UNIT) tablet Take 2,000 Units by mouth daily. Take 2 tablet (2000 unit total) by mouth one time daily for vitamin deficiency   donepezil (ARICEPT) 10 MG tablet Take 10 mg by mouth at bedtime.    escitalopram (LEXAPRO) 10 MG tablet Take 10 mg by mouth daily.   furosemide (LASIX) 20 MG  tablet Take 20 mg by mouth every Monday, Wednesday, and Friday.    levothyroxine (SYNTHROID) 175 MCG tablet Take 175 mcg by mouth daily before breakfast.   magnesium hydroxide (MILK OF MAGNESIA) 400 MG/5ML suspension If no BM in 3 days, give 30 cc Milk of Magnesium p.o. x 1 dose in 24 hours as needed (Do not use standing constipation orders for residents with renal failure CFR less than 30. Contact MD for orders) (Physician Order)   memantine (NAMENDA) 10 MG tablet Take 10 mg by mouth 2 (two) times daily.    Multiple Vitamin (MULTI-VITAMIN DAILY PO) Take one tablet by mouth once daily   NON FORMULARY DIET: REGULAR, THIN LIQUIDS   Nutritional Supplements (NUTRITIONAL SUPPLEMENT PO) Take 1 each by mouth daily. Magic Cup to help stabilize weight   PRESCRIPTION MEDICATION ABH gel 0.5mg /12.5mg /0.5mg  per 41ml Apply 18ml to no-hairy area of skin TID -behavior disturbance/ mood stability   QUEtiapine (SEROQUEL) 25 MG tablet Take 25 mg by mouth. Give 1 1/2 tablet by mouth twice daily(monitor and hold for sedation)   sennosides-docusate sodium (SENOKOT-S) 8.6-50 MG tablet Take 2 tablets by mouth daily.    Skin Protectants, Misc. (MINERIN) CREA Apply topically to ankles twice a day for dry skin.   Sodium Phosphates (RA SALINE ENEMA RE) If not relieved by Biscodyl suppository, give disposable Saline Enema rectally X 1 dose/24 hrs as needed (Do not use constipation standing orders for residents with renal failure/CFR less than 30. Contact MD for orders)(Physician Or   No facility-administered encounter medications on file as of 05/09/2021.    Review of Systems  Unable to obtain due to dementia.   Immunization History  Administered Date(s) Administered   Influenza-Unspecified 02/17/2016, 08/01/2018, 03/18/2019, 04/07/2020, 03/29/2021   Moderna Sars-Covid-2 Vaccination 07/06/2019, 08/03/2019   PPD Test 04/08/2016   Pneumococcal Conjugate-13 01/11/2017   Pneumococcal-Unspecified 02/16/2014   Tdap 02/27/2019    Unspecified SARS-COV-2 Vaccination 06/21/2020   Pertinent  Health Maintenance Due  Topic Date Due   FOOT EXAM  01/12/2021   HEMOGLOBIN A1C  10/13/2021   INFLUENZA VACCINE  Completed   OPHTHALMOLOGY EXAM  Discontinued   URINE MICROALBUMIN  Discontinued   DEXA SCAN  Discontinued   Fall Risk 04/20/2016 08/07/2016 01/10/2017 01/14/2018 01/16/2019  Falls in the past year? Yes Yes No No 1  Was there an injury with Fall? Yes - - - 0  Fall Risk Category Calculator - - - - 2  Fall Risk Category - - - - Moderate  Patient at Risk for Falls Due to - - - - History of fall(s);Impaired balance/gait;Impaired mobility  Fall risk Follow up - - - - Falls evaluation completed;Falls prevention discussed;Education provided     Vitals:   05/09/21 0937  BP: 132/74  Pulse: 70  Resp: 18  Temp: (!) 97.5 F (36.4 C)  Weight: 157 lb 6.4 oz (71.4 kg)  Height: 5\' 9"  (1.753 m)   Body mass index is 23.24 kg/m.  Physical Exam  GENERAL APPEARANCE: Well nourished. In no acute distress. Normal body habitus SKIN:  Right lateral orbital area with dry blood  MOUTH and THROAT: Lips are without lesions. Oral mucosa is moist and without lesions.  RESPIRATORY: Breathing is even & unlabored, BS CTAB CARDIAC: RRR, no murmur,no extra heart sounds, no edema GI: Abdomen soft, normal BS, no masses, no tenderness EXTREMITIES:  Able to move X 4 extremities NEUROLOGICAL: There is no tremor. Speech is clear. Alert to self, disoriented to time and place. PSYCHIATRIC:  agitated  Labs reviewed: Recent Labs    07/02/20 0000 09/13/20 0000 12/26/20 0000  NA 145 142 142  K 4.3 4.3 4.2  CL 106 108 106  CO2 30* 28* 28*  BUN 18 20 18   CREATININE 0.8 0.7 0.8  CALCIUM 9.1 8.4* 9.0   Recent Labs    07/02/20 0000 12/26/20 0000  AST 19 16  ALT 10 7  ALKPHOS 67 65  ALBUMIN 3.4* 3.3*   Recent Labs    07/02/20 0000 10/21/20 0000 12/26/20 0000 01/27/21 0000  WBC 3.6 3.2 3.2 3.8  NEUTROABS 0.70  --  0.90  --   HGB  11.9* 12.5 12.1 12.4  HCT 35* 38 91* 37  PLT 95* 132* 86* 120*   Lab Results  Component Value Date   TSH 7.79 (A) 01/24/2021   Lab Results  Component Value Date   HGBA1C 6.0 07/02/2020   Lab Results  Component Value Date   CHOL 144 07/02/2020   HDL 33 (A) 07/02/2020   LDLCALC 88 07/02/2020   TRIG 113 07/02/2020      Assessment/Plan  1. Fall, initial encounter -  fell trying to get up from her geri-chair sustaining a small laceration on her right lateral orbital area on 05/08/21 -   fell again twice this morning -  will send to ED for further evaluation  2. Urinary tract infection without hematuria, site unspecified -  urine culture showed  E. Coli 100,000 CFU/ml lactose fermenting gram negative rods -  ordered Cipro 500 mg BID X 7 days and Florastor 250 mg BID X 10 days  3. Agitation -  continue Quetiapine 50 mg BID and ABH gel 1 mg/25 mg/1mg  gel  to skin  BID  4. Dementia with behavioral disturbance -  BIMS score 3/15, ranging in severe cognitive impairment -   continue Memantine 10 mg BID and Donepezil 10 mg at bedtime    Family/ staff Communication:   Discussed plan of care with charge nurse  Labs/tests ordered:  None  Goals of care:   Long-term care    Durenda Age, DNP, MSN, FNP-BC [facility] [phone] (Monday-Friday 8:00 a.m. - 5:00 p.m.) [phone] (after hours)

## 2021-05-09 NOTE — ED Provider Notes (Signed)
[facility] EMERGENCY DEPARTMENT Provider Note   CSN: [id number] Arrival date & time: 05/09/21  [id number]     History Chief Complaint  Patient presents with   Urinary Tract Infection   Fall   Dementia    Kelly [name] is a 86 y.o. female.   Urinary Tract Infection Fall 86 year old female with history of dementia, presenting from [place] for multiple recent falls.  History is provided by patient's daughters.  Patient has reportedly had multiple falls over the past 3 days.  She has reportedly had a decrease in her dose of Ativan.  Urinalysis was recently performed and results became available yesterday.  Results did show concern of acute UTI.  Patient has reportedly not been started on antibiotics thus far.  Due to multiple falls this morning, patient presents to the ED.  She does present via EMS.  EMS provided limited history.  Patient unable to provide history due to history of dementia.     Past Medical History:  Diagnosis Date   Chronic kidney disease    Stage III   Dementia with behavioral disturbance 03/19/2015   Notated on FL2 Form from Dr. Agapito Games [phone]   Depression    previously saw Dr Casimiro Needle   Diabetes mellitus without complication ([place])    [id number] A1c 6%   Dyslipidemia    11/18/15 Tg 74, HDL 60, LDL 90 on statin   Hypertension    Hypothyroidism    11/18/15 TSH 53.6, 02/07/16 TSH 9.42 @ [facility]   MDD (major depressive disorder)    With behavioral disturbance   Scabies    02/21/16 [facility],[place]; Rx: Permethrin cream   Urinary tract infection due to Proteus 05/04/2019   Uniformly sensitive except to nitrofurantoin; Cipro prescribed   Vitamin D deficiency    11/18/15 vitamin D 25-hydroxy 39    Patient Active Problem List   Diagnosis Date Noted   Unspecified protein-calorie malnutrition ([place]) 05/02/2021   Pancytopenia ([place]) 07/05/2020   Secondary DM with CKD stage 3 and hypertension ([place])    UTI (urinary tract infection)  05/05/2019   Fall 05/03/2019   Anxiety 04/16/2019   Vitamin D deficiency    Healthcare maintenance 02/26/2019   Occult blood positive stool 02/17/2019   MDD (major depressive disorder)    Chronic leukopenia 03/04/2018   Thrombocytopenia ([place]) 02/20/2018   Mood disorder with psychosis ([place]) 02/20/2018   Hyperlipidemia 10/03/2016   Dementia with behavioral disturbance 04/12/2016   Prediabetes 04/12/2016   Essential hypertension 04/12/2016   Hypothyroidism 04/12/2016   Chronic kidney disease 04/12/2016   Advance care planning 03/19/2015    Past Surgical History:  Procedure Laterality Date   no record     04/10/15 [facility] note: see old chart     OB History   No obstetric history on file.     Family History  Family history unknown: Yes    Social History   Tobacco Use   Smoking status: Former   Smokeless tobacco: Never   Tobacco comments:    Pt has not smoked while at SNF per staff.   Vaping Use   Vaping Use: Never used  Substance Use Topics   Alcohol use: No   Drug use: No    Home Medications Prior to Admission medications   Medication Sig Start Date End Date Taking? Authorizing Provider  acetaminophen (TYLENOL) 325 MG tablet Take 325 mg by mouth every 6 (six) hours as needed. TAKE 2 TABLETS =650MG  BY MOUTH EVERY 6 HOURS AS  NEEDED - PAIN    [provider]  aspirin EC 81 MG tablet Take 81 mg by mouth daily.     [provider]  atorvastatin (LIPITOR) 40 MG tablet Take 40 mg by mouth at bedtime.     [provider]  bisacodyl (DULCOLAX) 10 MG suppository If not relieved by MOM, give 10 mg Bisacodyl suppositiory rectally X 1 dose in 24 hours as needed (Do not use constipation standing orders for residents with renal failure/CFR less than 30. Contact MD for orders) (Physician Order)    [provider]  Calcium Carb-Cholecalciferol (CALCIUM-VITAMIN D3) 600-400 MG-UNIT TABS Take 1 tablet by mouth 2 (two) times daily.    [provider]  Carboxymethylcellulose Sodium (ARTIFICIAL TEARS OP) Apply to eye. Instill 2 gtts to both eyes twice daily Dx: dry eyes    [provider]  cephALEXin (KEFLEX) 500 MG capsule Take 1 capsule (500 mg total) by mouth 4 (four) times daily for 5 days. 05/09/21 05/14/21  Godfrey Pick, MD  cholecalciferol (VITAMIN D3) 25 MCG (1000 UNIT) tablet Take 2,000 Units by mouth daily. Take 2 tablet (2000 unit total) by mouth one time daily for vitamin deficiency    [provider]  donepezil (ARICEPT) 10 MG tablet Take 10 mg by mouth at bedtime.     [provider]  escitalopram (LEXAPRO) 10 MG tablet Take 10 mg by mouth daily.    [provider]  furosemide (LASIX) 20 MG tablet Take 20 mg by mouth every Monday, Wednesday, and Friday.     [provider]  levothyroxine (SYNTHROID) 175 MCG tablet Take 175 mcg by mouth daily before breakfast.    [provider]  magnesium hydroxide (MILK OF MAGNESIA) 400 MG/5ML suspension If no BM in 3 days, give 30 cc Milk of Magnesium p.o. x 1 dose in 24 hours as needed (Do not use standing constipation orders for residents with renal failure CFR less than 30. Contact MD for orders) (Physician Order)    [provider]  memantine (NAMENDA) 10 MG tablet Take 10 mg by mouth 2 (two) times daily.     [provider]  Multiple Vitamin (MULTI-VITAMIN DAILY PO) Take one tablet by mouth once daily    [provider]  NON FORMULARY DIET: REGULAR, [place]    [provider]  Nutritional Supplements (NUTRITIONAL SUPPLEMENT PO) Take 1 each by mouth daily. Magic Cup to help stabilize weight    [provider]  PRESCRIPTION MEDICATION ABH gel 0.5mg /12.5mg /0.5mg  per 77ml Apply 12ml to no-hairy area of skin TID -behavior disturbance/ mood stability    [provider]  QUEtiapine (SEROQUEL) 25 MG tablet Take 25 mg by mouth. Give 1 1/2 tablet by mouth twice daily(monitor and hold  for sedation)    [provider]  sennosides-docusate sodium (SENOKOT-S) 8.6-50 MG tablet Take 2 tablets by mouth daily.     [provider]  Skin Protectants, Misc. (MINERIN) CREA Apply topically to ankles twice a day for dry skin.    [provider]  Sodium Phosphates (RA SALINE ENEMA RE) If not relieved by Biscodyl suppository, give disposable Saline Enema rectally X 1 dose/24 hrs as needed (Do not use constipation standing orders for residents with renal failure/CFR less than 30. Contact MD for orders)(Physician Or    [provider]    Allergies    Patient has no known allergies.  Review of Systems   Review of Systems  Unable to perform ROS:  Dementia   Physical Exam Updated Vital Signs BP (!) 116/57   Pulse (!) 59   Temp 98.3 F (36.8 C)   Resp 15   SpO2 98%   Physical Exam Vitals and nursing note reviewed.  Constitutional:      General: She is not in acute distress.    Appearance: Normal appearance. She is well-developed and normal weight. She is not ill-appearing, toxic-appearing or diaphoretic.  HENT:     Head: Normocephalic.     Comments: Small, scabbed wound to area lateral to right eyebrow    Right Ear: External ear normal.     Left Ear: External ear normal.     Nose: Nose normal.     Mouth/Throat:     Mouth: Mucous membranes are moist.     Pharynx: Oropharynx is clear.  Eyes:     General: No scleral icterus.    Extraocular Movements: Extraocular movements intact.     Conjunctiva/sclera: Conjunctivae normal.  Cardiovascular:     Rate and Rhythm: Normal rate and regular rhythm.     Heart sounds: No murmur heard. Pulmonary:     Effort: Pulmonary effort is normal. No respiratory distress.     Breath sounds: Normal breath sounds. No wheezing or rales.  Chest:     Chest wall: No tenderness.  Abdominal:     Palpations: Abdomen is soft.     Tenderness: There is no abdominal tenderness.  Musculoskeletal:        General:  Tenderness (Left shoulder, left elbow) present. No swelling or deformity. Normal range of motion.     Cervical back: Normal range of motion and neck supple. No rigidity or tenderness.     Right lower leg: No edema.     Left lower leg: No edema.  Skin:    General: Skin is warm and dry.     Capillary Refill: Capillary refill takes less than 2 seconds.     Coloration: Skin is not jaundiced or pale.  Neurological:     General: No focal deficit present.     Mental Status: She is alert. She is disoriented.  Psychiatric:        Mood and Affect: Mood normal.        Behavior: Behavior normal.    ED Results / Procedures / Treatments   Labs (all labs ordered are listed, but only abnormal results are displayed) Labs Reviewed  COMPREHENSIVE METABOLIC PANEL - Abnormal; Notable for the following components:      Result Value   Glucose, Bld 100 (*)    Creatinine, Ser 1.06 (*)    GFR, Estimated 52 (*)    All other components within normal limits  URINALYSIS, ROUTINE W REFLEX MICROSCOPIC - Abnormal; Notable for the following components:   APPearance HAZY (*)    pH 8.5 (*)    Nitrite POSITIVE (*)    All other components within normal limits  URINALYSIS, MICROSCOPIC (REFLEX) - Abnormal; Notable for the following components:   Bacteria, UA FEW (*)    All other components within normal limits  RESP PANEL BY RT-PCR (FLU A&B, COVID) [id number]  URINE CULTURE  CBC  MAGNESIUM  TSH    EKG EKG Interpretation  Date/Time:  Tuesday May 09 2021 11:58:18 EST Ventricular Rate:  61 PR Interval:  163 QRS Duration: 101 QT Interval:  442 QTC Calculation: 446 R Axis:   -33 Text Interpretation: Sinus rhythm Atrial premature complex Left axis deviation Probable anteroseptal infarct, old Confirmed by Doren Custard, Eladio Dentremont ([id number]) on  05/09/2021 12:42:35 PM  Radiology DG Elbow Complete Left  Result Date: 05/09/2021 CLINICAL DATA:  Fall, pain EXAM: LEFT ELBOW - COMPLETE 3+ VIEW COMPARISON:  None. FINDINGS: Suspect  a very subtle transversely oriented fracture of the left radial head. No definite fracture. There is a large elbow joint effusion. Soft tissue edema overlying the olecranon. IMPRESSION: 1. Suspect a very subtle transversely oriented fracture of the left radial head. There is however no definite fracture. 2. Large elbow joint effusion, in general strongly associated with radiographically occult fractures of the radial head and neck. 3.  Soft tissue edema overlying the olecranon. Electronically Signed   By: Delanna Ahmadi M.D.   On: 05/09/2021 13:10   CT HEAD WO CONTRAST  Result Date: 05/09/2021 CLINICAL DATA:  Head trauma, minor. Neck trauma. Additional history provided: Recent falls. EXAM: CT HEAD WITHOUT CONTRAST CT CERVICAL SPINE WITHOUT CONTRAST TECHNIQUE: Multidetector CT imaging of the head and cervical spine was performed following the standard protocol without intravenous contrast. Multiplanar CT image reconstructions of the cervical spine were also generated. COMPARISON:  Brain MRI 03/10/2016. FINDINGS: CT HEAD FINDINGS Brain: Mild generalized cerebral and cerebellar atrophy. Mild patchy and ill-defined hypoattenuation within the cerebral white matter, nonspecific but compatible chronic small vessel ischemic disease. There is no acute intracranial hemorrhage. No demarcated cortical infarct. No extra-axial fluid collection. No evidence of an intracranial mass. No midline shift. Vascular: No hyperdense vessel.  Atherosclerotic calcifications Skull: Normal. Negative for fracture or focal lesion. Sinuses/Orbits: Visualized orbits show no acute finding. No significant paranasal sinus disease at the imaged levels. Other: Subtle right periorbital soft tissue swelling is questioned. CT CERVICAL SPINE FINDINGS Alignment: Reversal of the expected cervical lordosis. No significant spondylolisthesis. Skull base and vertebrae: The basion-dental and atlanto-dental intervals are maintained.No evidence of acute fracture  to the cervical spine. Soft tissues and spinal canal: No prevertebral fluid or swelling. No visible canal hematoma. Disc levels: Cervical spondylosis. Most notably at C5-C6, there is advanced disc space narrowing with a posterior disc osteophyte complex and bilateral uncovertebral hypertrophy. No appreciable high-grade spinal canal stenosis. Bony neural foraminal narrowing bilaterally at C6-C7. Upper chest: No consolidation within the imaged lung apices. No visible pneumothorax. Incidentally noted azygos fissure (anatomic variant). IMPRESSION: CT head: 1. No evidence of acute intracranial abnormality. 2. Mild chronic small-vessel ischemic changes within the cerebral white matter. 3. Mild generalized parenchymal atrophy. 4. Subtle right periorbital soft tissue swelling is questioned. Correlate with findings on direct examination. CT cervical spine: 1. No evidence of acute fracture to the cervical spine. 2. Reversal of the expected cervical lordosis. 3. Cervical spondylosis, as described and greatest at C5-C6 and C6-C7. Electronically Signed   By: Kellie Simmering D.O.   On: 05/09/2021 12:05   CT CERVICAL SPINE WO CONTRAST  Result Date: 05/09/2021 CLINICAL DATA:  Head trauma, minor. Neck trauma. Additional history provided: Recent falls. EXAM: CT HEAD WITHOUT CONTRAST CT CERVICAL SPINE WITHOUT CONTRAST TECHNIQUE: Multidetector CT imaging of the head and cervical spine was performed following the standard protocol without intravenous contrast. Multiplanar CT image reconstructions of the cervical spine were also generated. COMPARISON:  Brain MRI 03/10/2016. FINDINGS: CT HEAD FINDINGS Brain: Mild generalized cerebral and cerebellar atrophy. Mild patchy and ill-defined hypoattenuation within the cerebral white matter, nonspecific but compatible chronic small vessel ischemic disease. There is no acute intracranial hemorrhage. No demarcated cortical infarct. No extra-axial fluid collection. No evidence of an intracranial  mass. No midline shift. Vascular: No hyperdense vessel.  Atherosclerotic calcifications Skull: Normal. Negative  for fracture or focal lesion. Sinuses/Orbits: Visualized orbits show no acute finding. No significant paranasal sinus disease at the imaged levels. Other: Subtle right periorbital soft tissue swelling is questioned. CT CERVICAL SPINE FINDINGS Alignment: Reversal of the expected cervical lordosis. No significant spondylolisthesis. Skull base and vertebrae: The basion-dental and atlanto-dental intervals are maintained.No evidence of acute fracture to the cervical spine. Soft tissues and spinal canal: No prevertebral fluid or swelling. No visible canal hematoma. Disc levels: Cervical spondylosis. Most notably at C5-C6, there is advanced disc space narrowing with a posterior disc osteophyte complex and bilateral uncovertebral hypertrophy. No appreciable high-grade spinal canal stenosis. Bony neural foraminal narrowing bilaterally at C6-C7. Upper chest: No consolidation within the imaged lung apices. No visible pneumothorax. Incidentally noted azygos fissure (anatomic variant). IMPRESSION: CT head: 1. No evidence of acute intracranial abnormality. 2. Mild chronic small-vessel ischemic changes within the cerebral white matter. 3. Mild generalized parenchymal atrophy. 4. Subtle right periorbital soft tissue swelling is questioned. Correlate with findings on direct examination. CT cervical spine: 1. No evidence of acute fracture to the cervical spine. 2. Reversal of the expected cervical lordosis. 3. Cervical spondylosis, as described and greatest at C5-C6 and C6-C7. Electronically Signed   By: Kellie Simmering D.O.   On: 05/09/2021 12:05   DG Pelvis Portable  Result Date: 05/09/2021 CLINICAL DATA:  Fall this morning.  Dementia. EXAM: PORTABLE PELVIS 1-2 VIEWS COMPARISON:  None. FINDINGS: 1208 hours. The bones appear mildly demineralized. No evidence of acute fracture or dislocation. Linear lucency projecting  over the right superior pubic ramus extends superiorly beyond the bone, attributed to overlying gas in the rectum. There is prominent stool in the rectum. Mild degenerative changes are present in the hips and sacroiliac joints. There are surgical clips in the right groin. IMPRESSION: No evidence of acute pelvic fracture or dislocation. Electronically Signed   By: Richardean Sale M.D.   On: 05/09/2021 12:19   DG Chest Port 1 View  Result Date: 05/09/2021 CLINICAL DATA:  Fall EXAM: PORTABLE CHEST 1 VIEW COMPARISON:  Portable exam 1207 hours without priors for comparison FINDINGS: Normal heart size and pulmonary vascularity. Atherosclerotic calcification and tortuosity of thoracic aorta. Bronchitic changes with RIGHT basilar atelectasis. Remaining lungs clear. No infiltrate, pleural effusion, or pneumothorax. As this fissure noted. No acute osseous findings. IMPRESSION: Bronchitic changes with RIGHT basilar atelectasis. Aortic Atherosclerosis (ICD10-I70.0). Electronically Signed   By: Lavonia Dana M.D.   On: 05/09/2021 12:33   DG Shoulder Left  Result Date: 05/09/2021 CLINICAL DATA:  Left shoulder pain after fall. EXAM: LEFT SHOULDER - 2+ VIEW COMPARISON:  None. FINDINGS: There is no evidence of fracture or dislocation. There is no evidence of arthropathy or other focal bone abnormality. Soft tissues are unremarkable. IMPRESSION: Negative. Electronically Signed   By: Marijo Conception M.D.   On: 05/09/2021 13:09    Procedures Procedures   Medications Ordered in ED Medications  Tdap (BOOSTRIX) injection 0.5 mL (0.5 mLs Intramuscular Given 05/09/21 1122)  acetaminophen (TYLENOL) tablet 650 mg (650 mg Oral Given 05/09/21 1123)  LORazepam (ATIVAN) injection 0.5 mg (0.5 mg Intravenous Given 05/09/21 1117)  cefTRIAXone (ROCEPHIN) 1 g in sodium chloride 0.9 % 100 mL IVPB (0 g Intravenous Stopped 05/09/21 1712)    ED Course  I have reviewed the triage vital signs and the nursing notes.  Pertinent labs &  imaging results that were available during my care of the patient were reviewed by me and considered in my medical decision making (see chart  for details).    MDM Rules/Calculators/A&P                          Patient is 86 year old female with history of dementia, presenting for a recent fall.  History is provided by EMS only.  On exam, patient is able to answer yes or no questions.  She does endorse tenderness in her left elbow and left shoulder.  Will obtain imaging to assess for traumatic injuries.  Additionally, lab work, including urinalysis ordered.  Given her small laceration, lateral to her right eyebrow, tetanus was updated.  Per her daughters, this wound is from Saturday.  It does appear to be well-healing.  There is no indication for closure in the ED.  Due to concern of patient being able to lay still for CT scanning, 0.5 mg dose of Ativan was ordered.  Results of CT imaging showed no acute injuries to head or cervical spine.  X-ray of elbow showed concern for subtle transversely oriented fracture of left radial head.  Additionally, there was a large elbow joint effusion.  I did discuss this with orthopedic surgery team.  They did recommend sugar-tong splint and orthopedic follow-up.  Urinalysis was nitrite positive.  Patient was treated empirically for UTI with ceftriaxone.  She was prescribed Keflex and given contact information for orthopedic follow-up.  Splint and sling were applied in the ED.  Final Clinical Impression(s) / ED Diagnoses Final diagnoses:  Fall  Acute cystitis without hematuria    Rx / DC Orders ED Discharge Orders          Ordered    cephALEXin (KEFLEX) 500 MG capsule  4 times daily,   Status:  Discontinued        05/09/21 1633    cephALEXin (KEFLEX) 500 MG capsule  4 times daily        05/09/21 1704             Gloris Manchester, MD 05/09/21 1850

## 2021-05-09 NOTE — ED Notes (Signed)
[facility] called to transport patient  

## 2021-05-09 NOTE — ED Triage Notes (Signed)
EMS stated, picked up at [facility] and was dx UTI 3 days ago but has not received any medication for it. We were called for a fall but falls a lot and called for an evaluation.

## 2021-05-09 NOTE — ED Notes (Signed)
Called report to [facility] and spoke with Sue Lush about medication given and discharge instructions. Pt family also made aware of discharge and instructions

## 2021-05-10 LAB — URINE CULTURE: Culture: NO GROWTH

## 2021-05-12 ENCOUNTER — Encounter: Payer: Self-pay | Admitting: Internal Medicine

## 2021-05-12 ENCOUNTER — Non-Acute Institutional Stay (SKILLED_NURSING_FACILITY): Payer: Medicare Other | Admitting: Internal Medicine

## 2021-05-12 DIAGNOSIS — S52125D Nondisplaced fracture of head of left radius, subsequent encounter for closed fracture with routine healing: Secondary | ICD-10-CM

## 2021-05-12 DIAGNOSIS — S52123A Displaced fracture of head of unspecified radius, initial encounter for closed fracture: Secondary | ICD-10-CM

## 2021-05-12 DIAGNOSIS — F39 Unspecified mood [affective] disorder: Secondary | ICD-10-CM | POA: Diagnosis not present

## 2021-05-12 DIAGNOSIS — N390 Urinary tract infection, site not specified: Secondary | ICD-10-CM | POA: Diagnosis not present

## 2021-05-12 DIAGNOSIS — W19XXXD Unspecified fall, subsequent encounter: Secondary | ICD-10-CM

## 2021-05-12 HISTORY — DX: Displaced fracture of head of unspecified radius, initial encounter for closed fracture: S52.123A

## 2021-05-12 NOTE — Progress Notes (Signed)
NURSING HOME LOCATION:  [facility] ROOM NUMBER:  [id number]  CODE STATUS: Full code  PCP: Dayna Ramus MD  This is a nursing facility follow up visit for specific acute issue of altered mental status with recurrent falls in the setting of acute cystitis.  Interim medical record and care since last SNF visit was updated with review of diagnostic studies and change in clinical status since last visit were documented.  HPI: The patient had fallen 05/08/2021 and sustained a small laceration at the right lateral periorbital area.  She is chronically confused and will attempt to pinch or grab staff when in close proximity. There is a change in her usual mental status in that she was becoming more agitated attempting to get out of her [place] chair, resulting in recurrent falls. Urine culture collected 11/21 revealed E. coli 100,000 colonies.  Cipro 500 mg mg twice daily for 7 days was ordered by the [facility] NP. She fell again x2 on 11/22 prompting ED evaluation. In the ED creatinine was 1.06 and GFR 52 indicating CKD stage IIIa.  Urinalysis revealed positive nitrites and few bacteria.  Ceftriaxone was administered empirically and Keflex ordered. LUE imaging revealed a very subtle transversely oriented fracture of the left radial head.  Left elbow joint revealed effusion.  Soft tissue edema was noted over the olecranon area. CT of the head revealed mild chronic small vessel ischemic changes within the cerebral white matter and mild generalized parenchymal atrophy.  There was subtle right periorbital soft tissue swelling. Orthopedics was consulted ; sugar-tong splint and orthopedic follow-up was recommended.  Review of systems: Patient can provide no meaningful history.  Initially she was asleep in the lounge chair inside the nurses' desk area.  When addressed she would open her eyes widely but responses were nonsensical and unfocused.  There is faint ecchymosis at the right lateral  periorbital area with a small V-shaped laceration with associated eschar.  Dental hygiene is surprisingly good.  She exhibits vermiform movements of the tongue intermittently.  Heart rate is slow.  Breath sounds are decreased.  Abdomen is protuberant.  She can move all extremities.  Staff reports she actually got out of her chair once this morning and scooted across the floor.  I cannot appreciate any significant effusion or ecchymosis of the left elbow.  There may be some very faint hyperpigmentation in this area.  Rotation, flexion, & extension of the left elbow did not result in any apparent discomfort.  Tapping on the olecranon process also did not elicit apparent pain.  I also can see no swelling at the radial head or associated skin color change.  There appeared to be some decreased range of motion of both wrists.  She complained of pain when asked but indicates she is having pain in both wrists.  Physical exam:  Pertinent or positive findings: General appearance: Adequately nourished; no acute distress, increased work of breathing is present.   Lymphatic: No lymphadenopathy about the head, neck, axilla. Eyes: No conjunctival inflammation or lid edema is present. There is no scleral icterus. Ears:  External ear exam shows no significant lesions or deformities.   Nose:  External nasal examination shows no deformity or inflammation. Nasal mucosa are pink and moist without lesions, exudates Neck:  No thyromegaly, masses, tenderness noted.    Heart:  No gallop, murmur, click, rub .  Lungs: without wheezes, rhonchi, rales, rubs. Abdomen: Bowel sounds are normal. Abdomen is soft and nontender with no organomegaly, hernias,  masses. GU: Deferred  Extremities:  No cyanosis, clubbing, edema  Neurologic exam :Balance, [place], finger to nose testing could not be completed due to clinical state Skin: Warm & dry w/o tenting. No significant lesions or rash.  See summary under each active problem in the  Problem List with associated updated therapeutic plan

## 2021-05-12 NOTE — Assessment & Plan Note (Addendum)
Depakote will be ordered as not on Epic Med List as a trial to address her behavioral issues.  Psych follow-up requested.

## 2021-05-12 NOTE — Assessment & Plan Note (Addendum)
Regular, maintenance doses of Tylenol and meloxicam will be administered x5 days with subsequent evaluation.  Because of the lack of a definite fracture and clinical limitations in the context of her dementia; repeat imaging will be considered next week.

## 2021-05-12 NOTE — Patient Instructions (Signed)
See assessment and plan under each diagnosis in the problem list and acutely for this visit 

## 2021-05-12 NOTE — Assessment & Plan Note (Addendum)
On Keflex orally after empiric ceftriaxone in the ED 11/22 for cystitis.  Culture at the SNF had revealed greater than 100,000 E. coli.  Cipro has been ordered; it is unclear whether this ever actually got initiated prior to her ED visit for recurrent falls.

## 2021-05-12 NOTE — Assessment & Plan Note (Addendum)
As she cannot provide any history; maintenance Tylenol and meloxicam will be initiated for possible pain related to possible radial fracture.

## 2021-05-15 ENCOUNTER — Encounter: Payer: Self-pay | Admitting: Adult Health

## 2021-05-15 ENCOUNTER — Non-Acute Institutional Stay (SKILLED_NURSING_FACILITY): Payer: Medicare Other | Admitting: Adult Health

## 2021-05-15 DIAGNOSIS — N390 Urinary tract infection, site not specified: Secondary | ICD-10-CM

## 2021-05-15 DIAGNOSIS — F39 Unspecified mood [affective] disorder: Secondary | ICD-10-CM

## 2021-05-15 DIAGNOSIS — S52125D Nondisplaced fracture of head of left radius, subsequent encounter for closed fracture with routine healing: Secondary | ICD-10-CM | POA: Diagnosis not present

## 2021-05-15 NOTE — Progress Notes (Signed)
Location:  [facility] Room Number: 214-B Place of Service:  SNF (31) Provider:  Kenard Gower, DNP, FNP-BC  Patient Care Team: Pecola Lawless, MD as PCP - General (Internal Medicine)  Extended Emergency Contact Information Primary Emergency Contact: Robinson,Roberto Address: [address]          [place], [place] [address] [place] Home Phone: [phone] Mobile Phone: [phone] Relation: Son Secondary Emergency Contact: Reid,Cynthia  [place] Mobile Phone: [phone] Relation: Daughter  Code Status: Full Code   Goals of care: Advanced Directive information Advanced Directives 05/15/2021  Does Patient Have a Medical Advance Directive? Yes  Type of Advance Directive Healthcare Power of Attorney  Does patient want to make changes to medical advance directive? No - Patient declined  Copy of Healthcare Power of Attorney in Chart? Yes - validated most recent copy scanned in chart (See row information)  Would patient like information on creating a medical advance directive? -  Pre-existing out of facility DNR order (yellow form or pink MOST form) -     Chief Complaint  Patient presents with   Acute Visit    Agitation     HPI:  Pt is a 86 y.o. female seen today for an acute visit. She is a long-term care resident of [facility]. She was recently transferred to the ED on 05/09/21 due to severe agitation and fall. She was initially started on Cipro for E. Coli UTI.She has been having agitation and had fall incident. She was transferred to the hospital. CT imaging showed no acute injuries to her head or cervical spine.  X-ray of the elbow showed concern for subtle transversely oriented fracture of the left radial head.  Additionally, there was a large elbow joint effusion.  Orthopedic was consulted and recommended sugar-tong splint and orthopedic follow-up.  She was empirically treated for  UTI with ceftriaxone and prescribed Keflex on discharge.  Splint and sling were applied in the ED.    At [facility], she was started on Depakote sprinkle 125 mg twice a day for 5 days for mood disorder, 05/12/21. Noted no grabbing today.   Past Medical History:  Diagnosis Date   Chronic kidney disease    Stage III   Dementia with behavioral disturbance 03/19/2015   Notated on FL2 Form from Dr. Dreama Saa [id number]   Depression    previously saw Dr Donell Beers   Diabetes mellitus without complication (HCC)    11/18/15 A1c 6%   Dyslipidemia    11/18/15 Tg 74, HDL 60, LDL 90 on statin   Hypertension    Hypothyroidism    11/18/15 TSH 53.6, 02/07/16 TSH 9.42 @ [facility]   MDD (major depressive disorder)    With behavioral disturbance   Scabies    02/21/16 [facility],[place]; Rx: Permethrin cream   Urinary tract infection due to Proteus 05/04/2019   Uniformly sensitive except to nitrofurantoin; Cipro prescribed   Vitamin D deficiency    11/18/15 vitamin D 25-hydroxy 39   Past Surgical History:  Procedure Laterality Date   no record     04/10/15 [facility] note: see old chart    No Known Allergies  Outpatient Encounter Medications as of 05/15/2021  Medication Sig   acetaminophen (TYLENOL) 325 MG tablet Take 325 mg by mouth every 6 (six) hours as needed. TAKE 2 TABLETS =650MG  BY MOUTH EVERY 6 HOURS AS NEEDED - PAIN   aspirin EC 81 MG tablet Take 81 mg by mouth  daily.    atorvastatin (LIPITOR) 40 MG tablet Take 40 mg by mouth at bedtime.    bisacodyl (DULCOLAX) 10 MG suppository If not relieved by MOM, give 10 mg Bisacodyl suppositiory rectally X 1 dose in 24 hours as needed (Do not use constipation standing orders for residents with renal failure/CFR less than 30. Contact MD for orders) (Physician Order)   Calcium Carb-Cholecalciferol (CALCIUM-VITAMIN D3) 600-400 MG-UNIT TABS Take 1 tablet by mouth 2 (two) times daily.   Carboxymethylcellulose Sodium (ARTIFICIAL TEARS OP) Apply to  eye. Instill 2 gtts to both eyes twice daily Dx: dry eyes   cephALEXin (KEFLEX) 500 MG capsule Take 500 mg by mouth 4 (four) times daily. for 5 days   cholecalciferol (VITAMIN D3) 25 MCG (1000 UNIT) tablet Take 2,000 Units by mouth daily. Take 2 tablet (2000 unit total) by mouth one time daily for vitamin deficiency   ciprofloxacin (CIPRO) 500 MG tablet Take 500 mg by mouth 2 (two) times daily. FOR 7 DAYS FOR UTI   divalproex (DEPAKOTE) 125 MG DR tablet Take 125 mg by mouth 2 (two) times daily. x 5 days for increased agitation   donepezil (ARICEPT) 10 MG tablet Take 10 mg by mouth at bedtime.    escitalopram (LEXAPRO) 10 MG tablet Take 10 mg by mouth daily.   furosemide (LASIX) 20 MG tablet Take 20 mg by mouth every Monday, Wednesday, and Friday.    levothyroxine (SYNTHROID) 175 MCG tablet Take 175 mcg by mouth daily before breakfast.   magnesium hydroxide (MILK OF MAGNESIA) 400 MG/5ML suspension If no BM in 3 days, give 30 cc Milk of Magnesium p.o. x 1 dose in 24 hours as needed (Do not use standing constipation orders for residents with renal failure CFR less than 30. Contact MD for orders) (Physician Order)   meloxicam (MOBIC) 7.5 MG tablet Take 7.5 mg by mouth in the morning and at bedtime. For 5 days   memantine (NAMENDA) 10 MG tablet Take 10 mg by mouth 2 (two) times daily.    Multiple Vitamin (MULTI-VITAMIN DAILY PO) Take one tablet by mouth once daily   NON FORMULARY DIET: REGULAR, THIN LIQUIDS   Nutritional Supplements (ENSURE ENLIVE PO) ENSURE ENLIVE LIQUID GIVE 8 OUNCE BY MOUTH ONCE DAILY FOR SUPPLEMENT   Nutritional Supplements (NUTRITIONAL SUPPLEMENT PO) Take 1 each by mouth daily. Magic Cup to help stabilize weight   PRESCRIPTION MEDICATION ABH gel 0.5mg /12.5mg /0.5mg  per 57ml Apply 18ml to no-hairy area of skin TID -behavior disturbance/ mood stability   QUEtiapine (SEROQUEL) 25 MG tablet Take 75 mg by mouth 2 (two) times daily. FOR DEPRESSION/AGITATION. MONITOR FOR SEDATION AND HOLD.    saccharomyces boulardii (FLORASTOR) 250 MG capsule Take 250 mg by mouth 2 (two) times daily. FOR 10 DAYS   sennosides-docusate sodium (SENOKOT-S) 8.6-50 MG tablet Take 2 tablets by mouth daily.    Skin Protectants, Misc. (MINERIN) CREA Apply topically to ankles twice a day for dry skin.   Sodium Phosphates (RA SALINE ENEMA RE) If not relieved by Biscodyl suppository, give disposable Saline Enema rectally X 1 dose/24 hrs as needed (Do not use constipation standing orders for residents with renal failure/CFR less than 30. Contact MD for orders)(Physician Or   [DISCONTINUED] QUEtiapine (SEROQUEL) 25 MG tablet Take 25 mg by mouth. Give 1 1/2 tablet by mouth twice daily(monitor and hold for sedation)   No facility-administered encounter medications on file as of 05/15/2021.    Review of Systems  Unable to obtain due to dementia.   Immunization  History  Administered Date(s) Administered   Influenza-Unspecified 02/17/2016, 08/01/2018, 03/18/2019, 04/07/2020, 03/29/2021   Moderna Sars-Covid-2 Vaccination 07/06/2019, 08/03/2019   PPD Test 04/08/2016   Pneumococcal Conjugate-13 01/11/2017   Pneumococcal-Unspecified 02/16/2014   Tdap 02/27/2019, 05/09/2021   Unspecified SARS-COV-2 Vaccination 06/21/2020   Pertinent  Health Maintenance Due  Topic Date Due   FOOT EXAM  01/12/2021   HEMOGLOBIN A1C  10/13/2021   INFLUENZA VACCINE  Completed   OPHTHALMOLOGY EXAM  Discontinued   URINE MICROALBUMIN  Discontinued   DEXA SCAN  Discontinued   Fall Risk 08/07/2016 01/10/2017 01/14/2018 01/16/2019 05/09/2021  Falls in the past year? Yes No No 1 -  Was there an injury with Fall? - - - 0 -  Fall Risk Category Calculator - - - 2 -  Fall Risk Category - - - Moderate -  Patient Fall Risk Level - - - - High fall risk  Patient at Risk for Falls Due to - - - History of fall(s);Impaired balance/gait;Impaired mobility -  Fall risk Follow up - - - Falls evaluation completed;Falls prevention discussed;Education  provided -     Vitals:   05/15/21 1537  BP: 125/65  Pulse: 72  Resp: 17  Temp: (!) 97.4 F (36.3 C)  Weight: 157 lb 6.4 oz (71.4 kg)  Height: 5\' 9"  (1.753 m)   Body mass index is 23.24 kg/m.  Physical Exam  GENERAL APPEARANCE: Well nourished. In no acute distress. Normal body habitus SKIN:  Skin is warm and dry.  MOUTH and THROAT: Lips are without lesions. Oral mucosa is moist and without lesions.  RESPIRATORY: Breathing is even & unlabored, BS CTAB CARDIAC: RRR, no murmur,no extra heart sounds, no edema GI: Abdomen soft, normal BS, no masses, no tenderness EXTREMITIES:  Left elbow with splint and sling NEUROLOGICAL: There is no tremor. Speech is clear. Alert to self, disoriented to time and place. PSYCHIATRIC:  Affect and behavior are appropriate  Labs reviewed: Recent Labs    12/26/20 0000 05/05/21 0000 05/09/21 1115  NA 142 143  143 139  K 4.2 4.0  4.0 4.0  CL 106 107  107 105  CO2 28* 26*  26* 26  GLUCOSE  --   --  100*  BUN 18 13  13 15   CREATININE 0.8 0.9  0.9 1.06*  CALCIUM 9.0 9.5  9.5 9.0  MG  --   --  1.9   Recent Labs    12/26/20 0000 05/05/21 0000 05/09/21 1115  AST 16 25  25  32  ALT 7 16  16 19   ALKPHOS 65 111  111 80  BILITOT  --   --  0.3  PROT  --   --  6.6  ALBUMIN 3.3* 3.6 3.5   Recent Labs    07/02/20 0000 10/21/20 0000 12/26/20 0000 01/27/21 0000 05/05/21 0000 05/09/21 1115  WBC 3.6   < > 3.2 3.8 5.5  5.5 4.7  NEUTROABS 0.70  --  0.90  --   --   --   HGB 11.9*   < > 12.1 12.4 12.1  12.1 13.0  HCT 35*   < > 91* 37 38  38 39.4  MCV  --   --   --   --   --  94.3  PLT 95*   < > 86* 120* 160  160 205   < > = values in this interval not displayed.   Lab Results  Component Value Date   TSH 0.731 05/09/2021   Lab  Results  Component Value Date   HGBA1C 5.8 04/14/2021   Lab Results  Component Value Date   CHOL 144 07/02/2020   HDL 33 (A) 07/02/2020   LDLCALC 88 07/02/2020   TRIG 113 07/02/2020     Significant Diagnostic Results in last 30 days:  DG Elbow Complete Left  Result Date: 05/09/2021 CLINICAL DATA:  Fall, pain EXAM: LEFT ELBOW - COMPLETE 3+ VIEW COMPARISON:  None. FINDINGS: Suspect a very subtle transversely oriented fracture of the left radial head. No definite fracture. There is a large elbow joint effusion. Soft tissue edema overlying the olecranon. IMPRESSION: 1. Suspect a very subtle transversely oriented fracture of the left radial head. There is however no definite fracture. 2. Large elbow joint effusion, in general strongly associated with radiographically occult fractures of the radial head and neck. 3.  Soft tissue edema overlying the olecranon. Electronically Signed   By: Delanna Ahmadi M.D.   On: 05/09/2021 13:10   CT HEAD WO CONTRAST  Result Date: 05/09/2021 CLINICAL DATA:  Head trauma, minor. Neck trauma. Additional history provided: Recent falls. EXAM: CT HEAD WITHOUT CONTRAST CT CERVICAL SPINE WITHOUT CONTRAST TECHNIQUE: Multidetector CT imaging of the head and cervical spine was performed following the standard protocol without intravenous contrast. Multiplanar CT image reconstructions of the cervical spine were also generated. COMPARISON:  Brain MRI 03/10/2016. FINDINGS: CT HEAD FINDINGS Brain: Mild generalized cerebral and cerebellar atrophy. Mild patchy and ill-defined hypoattenuation within the cerebral white matter, nonspecific but compatible chronic small vessel ischemic disease. There is no acute intracranial hemorrhage. No demarcated cortical infarct. No extra-axial fluid collection. No evidence of an intracranial mass. No midline shift. Vascular: No hyperdense vessel.  Atherosclerotic calcifications Skull: Normal. Negative for fracture or focal lesion. Sinuses/Orbits: Visualized orbits show no acute finding. No significant paranasal sinus disease at the imaged levels. Other: Subtle right periorbital soft tissue swelling is questioned. CT CERVICAL SPINE FINDINGS  Alignment: Reversal of the expected cervical lordosis. No significant spondylolisthesis. Skull base and vertebrae: The basion-dental and atlanto-dental intervals are maintained.No evidence of acute fracture to the cervical spine. Soft tissues and spinal canal: No prevertebral fluid or swelling. No visible canal hematoma. Disc levels: Cervical spondylosis. Most notably at C5-C6, there is advanced disc space narrowing with a posterior disc osteophyte complex and bilateral uncovertebral hypertrophy. No appreciable high-grade spinal canal stenosis. Bony neural foraminal narrowing bilaterally at C6-C7. Upper chest: No consolidation within the imaged lung apices. No visible pneumothorax. Incidentally noted azygos fissure (anatomic variant). IMPRESSION: CT head: 1. No evidence of acute intracranial abnormality. 2. Mild chronic small-vessel ischemic changes within the cerebral white matter. 3. Mild generalized parenchymal atrophy. 4. Subtle right periorbital soft tissue swelling is questioned. Correlate with findings on direct examination. CT cervical spine: 1. No evidence of acute fracture to the cervical spine. 2. Reversal of the expected cervical lordosis. 3. Cervical spondylosis, as described and greatest at C5-C6 and C6-C7. Electronically Signed   By: Kellie Simmering D.O.   On: 05/09/2021 12:05   CT CERVICAL SPINE WO CONTRAST  Result Date: 05/09/2021 CLINICAL DATA:  Head trauma, minor. Neck trauma. Additional history provided: Recent falls. EXAM: CT HEAD WITHOUT CONTRAST CT CERVICAL SPINE WITHOUT CONTRAST TECHNIQUE: Multidetector CT imaging of the head and cervical spine was performed following the standard protocol without intravenous contrast. Multiplanar CT image reconstructions of the cervical spine were also generated. COMPARISON:  Brain MRI 03/10/2016. FINDINGS: CT HEAD FINDINGS Brain: Mild generalized cerebral and cerebellar atrophy. Mild patchy and ill-defined hypoattenuation within the  cerebral white matter,  nonspecific but compatible chronic small vessel ischemic disease. There is no acute intracranial hemorrhage. No demarcated cortical infarct. No extra-axial fluid collection. No evidence of an intracranial mass. No midline shift. Vascular: No hyperdense vessel.  Atherosclerotic calcifications Skull: Normal. Negative for fracture or focal lesion. Sinuses/Orbits: Visualized orbits show no acute finding. No significant paranasal sinus disease at the imaged levels. Other: Subtle right periorbital soft tissue swelling is questioned. CT CERVICAL SPINE FINDINGS Alignment: Reversal of the expected cervical lordosis. No significant spondylolisthesis. Skull base and vertebrae: The basion-dental and atlanto-dental intervals are maintained.No evidence of acute fracture to the cervical spine. Soft tissues and spinal canal: No prevertebral fluid or swelling. No visible canal hematoma. Disc levels: Cervical spondylosis. Most notably at C5-C6, there is advanced disc space narrowing with a posterior disc osteophyte complex and bilateral uncovertebral hypertrophy. No appreciable high-grade spinal canal stenosis. Bony neural foraminal narrowing bilaterally at C6-C7. Upper chest: No consolidation within the imaged lung apices. No visible pneumothorax. Incidentally noted azygos fissure (anatomic variant). IMPRESSION: CT head: 1. No evidence of acute intracranial abnormality. 2. Mild chronic small-vessel ischemic changes within the cerebral white matter. 3. Mild generalized parenchymal atrophy. 4. Subtle right periorbital soft tissue swelling is questioned. Correlate with findings on direct examination. CT cervical spine: 1. No evidence of acute fracture to the cervical spine. 2. Reversal of the expected cervical lordosis. 3. Cervical spondylosis, as described and greatest at C5-C6 and C6-C7. Electronically Signed   By: Kellie Simmering D.O.   On: 05/09/2021 12:05   DG Pelvis Portable  Result Date: 05/09/2021 CLINICAL DATA:  Fall this  morning.  Dementia. EXAM: PORTABLE PELVIS 1-2 VIEWS COMPARISON:  None. FINDINGS: 1208 hours. The bones appear mildly demineralized. No evidence of acute fracture or dislocation. Linear lucency projecting over the right superior pubic ramus extends superiorly beyond the bone, attributed to overlying gas in the rectum. There is prominent stool in the rectum. Mild degenerative changes are present in the hips and sacroiliac joints. There are surgical clips in the right groin. IMPRESSION: No evidence of acute pelvic fracture or dislocation. Electronically Signed   By: Richardean Sale M.D.   On: 05/09/2021 12:19   DG Chest Port 1 View  Result Date: 05/09/2021 CLINICAL DATA:  Fall EXAM: PORTABLE CHEST 1 VIEW COMPARISON:  Portable exam 1207 hours without priors for comparison FINDINGS: Normal heart size and pulmonary vascularity. Atherosclerotic calcification and tortuosity of thoracic aorta. Bronchitic changes with RIGHT basilar atelectasis. Remaining lungs clear. No infiltrate, pleural effusion, or pneumothorax. As this fissure noted. No acute osseous findings. IMPRESSION: Bronchitic changes with RIGHT basilar atelectasis. Aortic Atherosclerosis (ICD10-I70.0). Electronically Signed   By: Lavonia Dana M.D.   On: 05/09/2021 12:33   DG Shoulder Left  Result Date: 05/09/2021 CLINICAL DATA:  Left shoulder pain after fall. EXAM: LEFT SHOULDER - 2+ VIEW COMPARISON:  None. FINDINGS: There is no evidence of fracture or dislocation. There is no evidence of arthropathy or other focal bone abnormality. Soft tissues are unremarkable. IMPRESSION: Negative. Electronically Signed   By: Marijo Conception M.D.   On: 05/09/2021 13:09    Assessment/Plan  1. Urinary tract infection without hematuria, site unspecified -  continue Cipro and Florastor  2. Closed nondisplaced fracture of head of left radius with routine healing, subsequent encounter -  continue Meloxicam 7.5 mg BID for a total of 5 days -   continue splint and  sling on left elbow -  will x-ray left elbow to verify if there  was really a fracture  3. Mood disorder with psychosis ([place])  -   Will continue Depakote sprinkle 125 mg BID, ABH gel BID and Seroquel   Family/ staff Communication:   Discussed plan of care with charge nurse.  Labs/tests ordered:  None  Goals of care:   Long-term care   Durenda Age, DNP, MSN, FNP-BC [facility] [phone] (Monday-Friday 8:00 a.m. - 5:00 p.m.) [phone] (after hours)

## 2021-05-19 ENCOUNTER — Other Ambulatory Visit: Payer: Self-pay | Admitting: Adult Health

## 2021-05-19 ENCOUNTER — Non-Acute Institutional Stay (SKILLED_NURSING_FACILITY): Payer: Medicare Other | Admitting: Adult Health

## 2021-05-19 ENCOUNTER — Encounter: Payer: Self-pay | Admitting: Adult Health

## 2021-05-19 DIAGNOSIS — N390 Urinary tract infection, site not specified: Secondary | ICD-10-CM | POA: Diagnosis not present

## 2021-05-19 DIAGNOSIS — F39 Unspecified mood [affective] disorder: Secondary | ICD-10-CM

## 2021-05-19 DIAGNOSIS — F03918 Unspecified dementia, unspecified severity, with other behavioral disturbance: Secondary | ICD-10-CM | POA: Diagnosis not present

## 2021-05-19 MED ORDER — LORAZEPAM 2 MG/ML PO CONC: ORAL | 0 refills | Status: DC

## 2021-05-19 NOTE — Progress Notes (Signed)
Location:  [facility] Room Number: [place] of Service:  SNF (31) Provider:  Durenda Age, DNP, FNP-BC  Patient Care Team: Hendricks Limes, MD as PCP - General (Internal Medicine)  Extended Emergency Contact Information Primary Emergency Contact: Robinson,Roberto Address: [address]          [place], [place] [address] [name] of [place] Phone: [phone] Mobile Phone: [phone] Relation: Son Secondary Emergency Contact: Reid,Cynthia  [place] Mobile Phone: [phone] Relation: Daughter  Code Status:    Goals of care: Advanced Directive information Advanced Directives 05/15/2021  Does Patient Have a Medical Advance Directive? Yes  Type of Advance Directive [place]  Does patient want to make changes to medical advance directive? No - Patient declined  Copy of [place] in Chart? Yes - validated most recent copy scanned in chart (See row information)  Would patient like information on creating a medical advance directive? -  Pre-existing out of facility DNR order (yellow form or pink MOST form) -     Chief Complaint  Patient presents with   Acute Visit    Agitation    HPI:  Pt is a 86 y.o. female seen today for agitation. She is currently on ABH gel BID and she continues to have agitation Discussed with charge nurse and DON regarding behavior.  She is also on Seroquel 75 mg BID and Depakote 125 mg BID for mood disorder with psychosis. Resident noted to be on wheelchair and occasionally screams at staff.   Past Medical History:  Diagnosis Date   Chronic kidney disease    Stage III   Dementia with behavioral disturbance 03/19/2015   Notated on FL2 Form from Dr. Agapito Games [phone]   Depression    previously saw Dr Casimiro Needle   Diabetes mellitus without complication ([place])    [id number] A1c 6%   Dyslipidemia    11/18/15 Tg 74, HDL 60, LDL 90 on statin    Hypertension    Hypothyroidism    11/18/15 TSH 53.6, 02/07/16 TSH 9.42 @ [facility]   MDD (major depressive disorder)    With behavioral disturbance   Scabies    02/21/16 [facility],[place]; Rx: Permethrin cream   Urinary tract infection due to Proteus 05/04/2019   Uniformly sensitive except to nitrofurantoin; Cipro prescribed   Vitamin D deficiency    11/18/15 vitamin D 25-hydroxy 39   Past Surgical History:  Procedure Laterality Date   no record     04/10/15 [facility] note: see old chart    No Known Allergies  Outpatient Encounter Medications as of 05/19/2021  Medication Sig   acetaminophen (TYLENOL) 325 MG tablet Take 325 mg by mouth every 6 (six) hours as needed. TAKE 2 TABLETS =650MG  BY MOUTH EVERY 6 HOURS AS NEEDED - PAIN   aspirin EC 81 MG tablet Take 81 mg by mouth daily.    atorvastatin (LIPITOR) 40 MG tablet Take 40 mg by mouth at bedtime.    bisacodyl (DULCOLAX) 10 MG suppository If not relieved by MOM, give 10 mg Bisacodyl suppositiory rectally X 1 dose in 24 hours as needed (Do not use constipation standing orders for residents with renal failure/CFR less than 30. Contact MD for orders) (Physician Order)   Calcium Carb-Cholecalciferol (CALCIUM-VITAMIN D3) 600-400 MG-UNIT TABS Take 1 tablet by mouth 2 (two) times daily.   Carboxymethylcellulose Sodium (ARTIFICIAL TEARS OP) Apply to eye. Instill 2 gtts to both eyes twice daily Dx: dry  eyes   cholecalciferol (VITAMIN D3) 25 MCG (1000 UNIT) tablet Take 2,000 Units by mouth daily. Take 2 tablet (2000 unit total) by mouth one time daily for vitamin deficiency   divalproex (DEPAKOTE) 125 MG DR tablet Take 125 mg by mouth 2 (two) times daily. x 5 days for increased agitation   donepezil (ARICEPT) 10 MG tablet Take 10 mg by mouth at bedtime.    escitalopram (LEXAPRO) 10 MG tablet Take 10 mg by mouth daily.   furosemide (LASIX) 20 MG tablet Take 20 mg by mouth every Monday, Wednesday, and Friday.    levothyroxine (SYNTHROID)  175 MCG tablet Take 175 mcg by mouth daily before breakfast.   LORazepam (ATIVAN) 2 MG/ML concentrated solution Give 0.5 mg/ml gel apply topically to skin three times a day   magnesium hydroxide (MILK OF MAGNESIA) 400 MG/5ML suspension If no BM in 3 days, give 30 cc Milk of Magnesium p.o. x 1 dose in 24 hours as needed (Do not use standing constipation orders for residents with renal failure CFR less than 30. Contact MD for orders) (Physician Order)   memantine (NAMENDA) 10 MG tablet Take 10 mg by mouth 2 (two) times daily.    Multiple Vitamin (MULTI-VITAMIN DAILY PO) Take one tablet by mouth once daily   NON FORMULARY DIET: REGULAR, THIN LIQUIDS   Nutritional Supplements (ENSURE ENLIVE PO) ENSURE ENLIVE LIQUID GIVE 8 OUNCE BY MOUTH ONCE DAILY FOR SUPPLEMENT   Nutritional Supplements (NUTRITIONAL SUPPLEMENT PO) Take 1 each by mouth daily. Magic Cup to help stabilize weight   QUEtiapine (SEROQUEL) 25 MG tablet Take 75 mg by mouth 2 (two) times daily. FOR DEPRESSION/AGITATION. MONITOR FOR SEDATION AND HOLD.   saccharomyces boulardii (FLORASTOR) 250 MG capsule Take 250 mg by mouth 2 (two) times daily. FOR 10 DAYS   sennosides-docusate sodium (SENOKOT-S) 8.6-50 MG tablet Take 2 tablets by mouth daily.    Skin Protectants, Misc. (MINERIN) CREA Apply topically to ankles twice a day for dry skin.   Sodium Phosphates (RA SALINE ENEMA RE) If not relieved by Biscodyl suppository, give disposable Saline Enema rectally X 1 dose/24 hrs as needed (Do not use constipation standing orders for residents with renal failure/CFR less than 30. Contact MD for orders)(Physician Or   No facility-administered encounter medications on file as of 05/19/2021.    Review of Systems  Unable to obtain due to dementia.   Immunization History  Administered Date(s) Administered   Influenza-Unspecified 02/17/2016, 08/01/2018, 03/18/2019, 04/07/2020, 03/29/2021   Moderna Sars-Covid-2 Vaccination 07/06/2019, 08/03/2019   PPD Test  04/08/2016   Pneumococcal Conjugate-13 01/11/2017   Pneumococcal-Unspecified 02/16/2014   Tdap 02/27/2019, 05/09/2021   Unspecified SARS-COV-2 Vaccination 06/21/2020   Pertinent  Health Maintenance Due  Topic Date Due   FOOT EXAM  01/12/2021   HEMOGLOBIN A1C  10/13/2021   INFLUENZA VACCINE  Completed   OPHTHALMOLOGY EXAM  Discontinued   URINE MICROALBUMIN  Discontinued   DEXA SCAN  Discontinued   Fall Risk 08/07/2016 01/10/2017 01/14/2018 01/16/2019 05/09/2021  Falls in the past year? Yes No No 1 -  Was there an injury with Fall? - - - 0 -  Fall Risk Category Calculator - - - 2 -  Fall Risk Category - - - Moderate -  Patient Fall Risk Level - - - - High fall risk  Patient at Risk for Falls Due to - - - History of fall(s);Impaired balance/gait;Impaired mobility -  Fall risk Follow up - - - Falls evaluation completed;Falls prevention discussed;Education provided -  Vitals:   05/19/21 1500  BP: 126/65  Pulse: 72  Resp: 18  Temp: (!) 97.3 F (36.3 C)  Weight: 157 lb 6.4 oz (71.4 kg)  Height: 5\' 9"  (1.753 m)   Body mass index is 23.24 kg/m.  Physical Exam  GENERAL APPEARANCE: Well nourished. In no acute distress. Normal body habitus SKIN:  Skin is warm and dry.  MOUTH and THROAT: Lips are without lesions. Oral mucosa is moist and without lesions.  RESPIRATORY: Breathing is even & unlabored, BS CTAB CARDIAC: RRR, no murmur,no extra heart sounds, no edema GI: Abdomen soft, normal BS, no masses, no tenderness NEUROLOGICAL: There is no tremor. Speech is clear. Alert to self, disoriented to time and place. PSYCHIATRIC:  Agitated  Labs reviewed: Recent Labs    12/26/20 0000 05/05/21 0000 05/09/21 1115  NA 142 143  143 139  K 4.2 4.0  4.0 4.0  CL 106 107  107 105  CO2 28* 26*  26* 26  GLUCOSE  --   --  100*  BUN 18 13  13 15   CREATININE 0.8 0.9  0.9 1.06*  CALCIUM 9.0 9.5  9.5 9.0  MG  --   --  1.9   Recent Labs    12/26/20 0000 05/05/21 0000  05/09/21 1115  AST 16 25  25  32  ALT 7 16  16 19   ALKPHOS 65 111  111 80  BILITOT  --   --  0.3  PROT  --   --  6.6  ALBUMIN 3.3* 3.6 3.5   Recent Labs    07/02/20 0000 10/21/20 0000 12/26/20 0000 01/27/21 0000 05/05/21 0000 05/09/21 1115  WBC 3.6   < > 3.2 3.8 5.5  5.5 4.7  NEUTROABS 0.70  --  0.90  --   --   --   HGB 11.9*   < > 12.1 12.4 12.1  12.1 13.0  HCT 35*   < > 91* 37 38  38 39.4  MCV  --   --   --   --   --  94.3  PLT 95*   < > 86* 120* 160  160 205   < > = values in this interval not displayed.   Lab Results  Component Value Date   TSH 0.731 05/09/2021   Lab Results  Component Value Date   HGBA1C 5.8 04/14/2021   Lab Results  Component Value Date   CHOL 144 07/02/2020   HDL 33 (A) 07/02/2020   LDLCALC 88 07/02/2020   TRIG 113 07/02/2020    Significant Diagnostic Results in last 30 days:  DG Elbow Complete Left  Result Date: 05/09/2021 CLINICAL DATA:  Fall, pain EXAM: LEFT ELBOW - COMPLETE 3+ VIEW COMPARISON:  None. FINDINGS: Suspect a very subtle transversely oriented fracture of the left radial head. No definite fracture. There is a large elbow joint effusion. Soft tissue edema overlying the olecranon. IMPRESSION: 1. Suspect a very subtle transversely oriented fracture of the left radial head. There is however no definite fracture. 2. Large elbow joint effusion, in general strongly associated with radiographically occult fractures of the radial head and neck. 3.  Soft tissue edema overlying the olecranon. Electronically Signed   By: Delanna Ahmadi M.D.   On: 05/09/2021 13:10   CT HEAD WO CONTRAST  Result Date: 05/09/2021 CLINICAL DATA:  Head trauma, minor. Neck trauma. Additional history provided: Recent falls. EXAM: CT HEAD WITHOUT CONTRAST CT CERVICAL SPINE WITHOUT CONTRAST TECHNIQUE: Multidetector CT imaging of the head  and cervical spine was performed following the standard protocol without intravenous contrast. Multiplanar CT image  reconstructions of the cervical spine were also generated. COMPARISON:  Brain MRI 03/10/2016. FINDINGS: CT HEAD FINDINGS Brain: Mild generalized cerebral and cerebellar atrophy. Mild patchy and ill-defined hypoattenuation within the cerebral white matter, nonspecific but compatible chronic small vessel ischemic disease. There is no acute intracranial hemorrhage. No demarcated cortical infarct. No extra-axial fluid collection. No evidence of an intracranial mass. No midline shift. Vascular: No hyperdense vessel.  Atherosclerotic calcifications Skull: Normal. Negative for fracture or focal lesion. Sinuses/Orbits: Visualized orbits show no acute finding. No significant paranasal sinus disease at the imaged levels. Other: Subtle right periorbital soft tissue swelling is questioned. CT CERVICAL SPINE FINDINGS Alignment: Reversal of the expected cervical lordosis. No significant spondylolisthesis. Skull base and vertebrae: The basion-dental and atlanto-dental intervals are maintained.No evidence of acute fracture to the cervical spine. Soft tissues and spinal canal: No prevertebral fluid or swelling. No visible canal hematoma. Disc levels: Cervical spondylosis. Most notably at C5-C6, there is advanced disc space narrowing with a posterior disc osteophyte complex and bilateral uncovertebral hypertrophy. No appreciable high-grade spinal canal stenosis. Bony neural foraminal narrowing bilaterally at C6-C7. Upper chest: No consolidation within the imaged lung apices. No visible pneumothorax. Incidentally noted azygos fissure (anatomic variant). IMPRESSION: CT head: 1. No evidence of acute intracranial abnormality. 2. Mild chronic small-vessel ischemic changes within the cerebral white matter. 3. Mild generalized parenchymal atrophy. 4. Subtle right periorbital soft tissue swelling is questioned. Correlate with findings on direct examination. CT cervical spine: 1. No evidence of acute fracture to the cervical spine. 2.  Reversal of the expected cervical lordosis. 3. Cervical spondylosis, as described and greatest at C5-C6 and C6-C7. Electronically Signed   By: Jackey Loge D.O.   On: 05/09/2021 12:05   CT CERVICAL SPINE WO CONTRAST  Result Date: 05/09/2021 CLINICAL DATA:  Head trauma, minor. Neck trauma. Additional history provided: Recent falls. EXAM: CT HEAD WITHOUT CONTRAST CT CERVICAL SPINE WITHOUT CONTRAST TECHNIQUE: Multidetector CT imaging of the head and cervical spine was performed following the standard protocol without intravenous contrast. Multiplanar CT image reconstructions of the cervical spine were also generated. COMPARISON:  Brain MRI 03/10/2016. FINDINGS: CT HEAD FINDINGS Brain: Mild generalized cerebral and cerebellar atrophy. Mild patchy and ill-defined hypoattenuation within the cerebral white matter, nonspecific but compatible chronic small vessel ischemic disease. There is no acute intracranial hemorrhage. No demarcated cortical infarct. No extra-axial fluid collection. No evidence of an intracranial mass. No midline shift. Vascular: No hyperdense vessel.  Atherosclerotic calcifications Skull: Normal. Negative for fracture or focal lesion. Sinuses/Orbits: Visualized orbits show no acute finding. No significant paranasal sinus disease at the imaged levels. Other: Subtle right periorbital soft tissue swelling is questioned. CT CERVICAL SPINE FINDINGS Alignment: Reversal of the expected cervical lordosis. No significant spondylolisthesis. Skull base and vertebrae: The basion-dental and atlanto-dental intervals are maintained.No evidence of acute fracture to the cervical spine. Soft tissues and spinal canal: No prevertebral fluid or swelling. No visible canal hematoma. Disc levels: Cervical spondylosis. Most notably at C5-C6, there is advanced disc space narrowing with a posterior disc osteophyte complex and bilateral uncovertebral hypertrophy. No appreciable high-grade spinal canal stenosis. Bony neural  foraminal narrowing bilaterally at C6-C7. Upper chest: No consolidation within the imaged lung apices. No visible pneumothorax. Incidentally noted azygos fissure (anatomic variant). IMPRESSION: CT head: 1. No evidence of acute intracranial abnormality. 2. Mild chronic small-vessel ischemic changes within the cerebral white matter. 3. Mild generalized parenchymal atrophy.  4. Subtle right periorbital soft tissue swelling is questioned. Correlate with findings on direct examination. CT cervical spine: 1. No evidence of acute fracture to the cervical spine. 2. Reversal of the expected cervical lordosis. 3. Cervical spondylosis, as described and greatest at C5-C6 and C6-C7. Electronically Signed   By: Kellie Simmering D.O.   On: 05/09/2021 12:05   DG Pelvis Portable  Result Date: 05/09/2021 CLINICAL DATA:  Fall this morning.  Dementia. EXAM: PORTABLE PELVIS 1-2 VIEWS COMPARISON:  None. FINDINGS: 1208 hours. The bones appear mildly demineralized. No evidence of acute fracture or dislocation. Linear lucency projecting over the right superior pubic ramus extends superiorly beyond the bone, attributed to overlying gas in the rectum. There is prominent stool in the rectum. Mild degenerative changes are present in the hips and sacroiliac joints. There are surgical clips in the right groin. IMPRESSION: No evidence of acute pelvic fracture or dislocation. Electronically Signed   By: Richardean Sale M.D.   On: 05/09/2021 12:19   DG Chest Port 1 View  Result Date: 05/09/2021 CLINICAL DATA:  Fall EXAM: PORTABLE CHEST 1 VIEW COMPARISON:  Portable exam 1207 hours without priors for comparison FINDINGS: Normal heart size and pulmonary vascularity. Atherosclerotic calcification and tortuosity of thoracic aorta. Bronchitic changes with RIGHT basilar atelectasis. Remaining lungs clear. No infiltrate, pleural effusion, or pneumothorax. As this fissure noted. No acute osseous findings. IMPRESSION: Bronchitic changes with RIGHT  basilar atelectasis. Aortic Atherosclerosis (ICD10-I70.0). Electronically Signed   By: Lavonia Dana M.D.   On: 05/09/2021 12:33   DG Shoulder Left  Result Date: 05/09/2021 CLINICAL DATA:  Left shoulder pain after fall. EXAM: LEFT SHOULDER - 2+ VIEW COMPARISON:  None. FINDINGS: There is no evidence of fracture or dislocation. There is no evidence of arthropathy or other focal bone abnormality. Soft tissues are unremarkable. IMPRESSION: Negative. Electronically Signed   By: Marijo Conception M.D.   On: 05/09/2021 13:09    Assessment/Plan  1. Mood disorder with psychosis ([place]) -  will discontinue ABH gel and start Ativan 0.5 mg gel topically TID -  continue Seroquel 75 mg BID and Depakote sprinkle 125 mg BID  2. Urinary tract infection without hematuria, site unspecified -  completed antibiotics -  resolved  3. Dementia with behavioral disturbance -  BIMS score 3/15, ranging in severe cognitive impairment -  continue Aricept and Memantine -  continue supportive care     Family/ staff Communication:   Discussed plan of care with charge nurse.  Labs/tests ordered:   None  Goals of care:   Long-term care   Durenda Age, DNP, MSN, FNP-BC [facility] [phone] (Monday-Friday 8:00 a.m. - 5:00 p.m.) [phone] (after hours)

## 2021-05-29 ENCOUNTER — Non-Acute Institutional Stay (SKILLED_NURSING_FACILITY): Payer: Medicare Other | Admitting: Adult Health

## 2021-05-29 ENCOUNTER — Encounter: Payer: Self-pay | Admitting: Adult Health

## 2021-05-29 DIAGNOSIS — E034 Atrophy of thyroid (acquired): Secondary | ICD-10-CM

## 2021-05-29 DIAGNOSIS — I10 Essential (primary) hypertension: Secondary | ICD-10-CM

## 2021-05-29 DIAGNOSIS — F39 Unspecified mood [affective] disorder: Secondary | ICD-10-CM | POA: Diagnosis not present

## 2021-05-29 DIAGNOSIS — F339 Major depressive disorder, recurrent, unspecified: Secondary | ICD-10-CM

## 2021-05-29 DIAGNOSIS — F03918 Unspecified dementia, unspecified severity, with other behavioral disturbance: Secondary | ICD-10-CM

## 2021-05-29 NOTE — Progress Notes (Signed)
Location:  [facility] Room Number: 214-B Place of Service:  SNF (31) Provider:  Durenda Age, DNP, FNP-BC  Patient Care Team: Hendricks Limes, MD as PCP - General (Internal Medicine)  Extended Emergency Contact Information Primary Emergency Contact: Robinson,Roberto Address: [address]          [place], [place] [address] [name] of [place] Phone: [phone] Mobile Phone: [phone] Relation: Son Secondary Emergency Contact: Reid,Cynthia  [place] Mobile Phone: [phone] Relation: Daughter  Code Status: Full Code   Goals of care: Advanced Directive information Advanced Directives 05/29/2021  Does Patient Have a Medical Advance Directive? Yes  Type of Advance Directive [place]  Does patient want to make changes to medical advance directive? No - Patient declined  Copy of [place] in Chart? Yes - validated most recent copy scanned in chart (See row information)  Would patient like information on creating a medical advance directive? -  Pre-existing out of facility DNR order (yellow form or pink MOST form) -     Chief Complaint  Patient presents with   Medical Management of Chronic Issues    Routine Visit    HPI:  Pt is a 86 y.o. female seen today for medical management of chronic diseases.  She is a long-term care resident of [facility]. She has a PMH of dementia, hypothyroidism and hypertension. SBPs ranging from 119-139, with outlier 148.  She takes Furosemide 20 mg on MWF for hypertension. She takes levothyroxine 175 mcg 1 tab daily for hypothyroidism.  She was recently started on Ativan 0.5 mg gel topically 3 times daily and Depakote 125 mg 4 times daily for mood disorder with psychosis. She has occasional yelling and agitation.  She takes escitalopram 10 mg daily for depression.  PHQ-9 score is 0, not depressed.   Past Medical History:   Diagnosis Date   Chronic kidney disease    Stage III   Dementia with behavioral disturbance 03/19/2015   Notated on FL2 Form from Dr. Agapito Games [phone]   Depression    previously saw Dr Casimiro Needle   Diabetes mellitus without complication ([place])    [id number] A1c 6%   Dyslipidemia    11/18/15 Tg 74, HDL 60, LDL 90 on statin   Hypertension    Hypothyroidism    11/18/15 TSH 53.6, 02/07/16 TSH 9.42 @ [facility]   MDD (major depressive disorder)    With behavioral disturbance   Scabies    02/21/16 [facility],[place]; Rx: Permethrin cream   Urinary tract infection due to Proteus 05/04/2019   Uniformly sensitive except to nitrofurantoin; Cipro prescribed   Vitamin D deficiency    11/18/15 vitamin D 25-hydroxy 39   Past Surgical History:  Procedure Laterality Date   no record     04/10/15 [facility] note: see old chart    No Known Allergies  Outpatient Encounter Medications as of 05/29/2021  Medication Sig   acetaminophen (TYLENOL) 325 MG tablet Take 325 mg by mouth every 6 (six) hours as needed. TAKE 2 TABLETS =650MG  BY MOUTH EVERY 6 HOURS AS NEEDED - PAIN   aspirin EC 81 MG tablet Take 81 mg by mouth daily.    atorvastatin (LIPITOR) 40 MG tablet Take 40 mg by mouth at bedtime.    bisacodyl (DULCOLAX) 10 MG suppository If not relieved by MOM, give 10 mg Bisacodyl suppositiory rectally X 1 dose in 24 hours as needed (Do not use constipation standing  orders for residents with renal failure/CFR less than 30. Contact MD for orders) (Physician Order)   Calcium Carb-Cholecalciferol (CALCIUM-VITAMIN D3) 600-400 MG-UNIT TABS Take 1 tablet by mouth 2 (two) times daily.   Carboxymethylcellulose Sodium (ARTIFICIAL TEARS OP) Apply to eye. Instill 2 gtts to both eyes twice daily Dx: dry eyes   cholecalciferol (VITAMIN D3) 25 MCG (1000 UNIT) tablet Take 2,000 Units by mouth daily. Take 2 tablet (2000 unit total) by mouth one time daily for vitamin deficiency   divalproex (DEPAKOTE) 125 MG DR  tablet Take 125 mg by mouth 2 (two) times daily. x 5 days for increased agitation   donepezil (ARICEPT) 10 MG tablet Take 10 mg by mouth at bedtime.    escitalopram (LEXAPRO) 10 MG tablet Take 10 mg by mouth daily.   furosemide (LASIX) 20 MG tablet Take 20 mg by mouth every Monday, Wednesday, and Friday.    levothyroxine (SYNTHROID) 175 MCG tablet Take 175 mcg by mouth daily before breakfast.   LORazepam (ATIVAN) 2 MG/ML concentrated solution Give 0.5 mg/ml gel apply topically to skin three times a day   magnesium hydroxide (MILK OF MAGNESIA) 400 MG/5ML suspension If no BM in 3 days, give 30 cc Milk of Magnesium p.o. x 1 dose in 24 hours as needed (Do not use standing constipation orders for residents with renal failure CFR less than 30. Contact MD for orders) (Physician Order)   memantine (NAMENDA) 10 MG tablet Take 10 mg by mouth 2 (two) times daily.    Multiple Vitamin (MULTI-VITAMIN DAILY PO) Take one tablet by mouth once daily   NON FORMULARY DIET: REGULAR, THIN LIQUIDS   Nutritional Supplements (ENSURE ENLIVE PO) ENSURE ENLIVE LIQUID GIVE 8 OUNCE BY MOUTH ONCE DAILY FOR SUPPLEMENT   Nutritional Supplements (NUTRITIONAL SUPPLEMENT PO) Take 1 each by mouth daily. Magic Cup to help stabilize weight   QUEtiapine (SEROQUEL) 25 MG tablet Take 75 mg by mouth 2 (two) times daily. FOR DEPRESSION/AGITATION. MONITOR FOR SEDATION AND HOLD.   sennosides-docusate sodium (SENOKOT-S) 8.6-50 MG tablet Take 2 tablets by mouth daily.    Skin Protectants, Misc. (MINERIN) CREA Apply topically to ankles twice a day for dry skin.   Sodium Phosphates (RA SALINE ENEMA RE) If not relieved by Biscodyl suppository, give disposable Saline Enema rectally X 1 dose/24 hrs as needed (Do not use constipation standing orders for residents with renal failure/CFR less than 30. Contact MD for orders)(Physician Or   saccharomyces boulardii (FLORASTOR) 250 MG capsule Take 250 mg by mouth 2 (two) times daily. FOR 10 DAYS   No  facility-administered encounter medications on file as of 05/29/2021.    Review of Systems unable to obtain due to dementia   Immunization History  Administered Date(s) Administered   Influenza-Unspecified 02/17/2016, 08/01/2018, 03/18/2019, 04/07/2020, 03/29/2021   Moderna Sars-Covid-2 Vaccination 07/06/2019, 08/03/2019   PPD Test 04/08/2016   Pneumococcal Conjugate-13 01/11/2017   Pneumococcal-Unspecified 02/16/2014   Tdap 02/27/2019, 05/09/2021   Unspecified SARS-COV-2 Vaccination 06/21/2020   Pertinent  Health Maintenance Due  Topic Date Due   FOOT EXAM  01/12/2021   HEMOGLOBIN A1C  10/13/2021   INFLUENZA VACCINE  Completed   OPHTHALMOLOGY EXAM  Discontinued   URINE MICROALBUMIN  Discontinued   DEXA SCAN  Discontinued   Fall Risk 08/07/2016 01/10/2017 01/14/2018 01/16/2019 05/09/2021  Falls in the past year? Yes No No 1 -  Was there an injury with Fall? - - - 0 -  Fall Risk Category Calculator - - - 2 -  Fall  Risk Category - - - Moderate -  Patient Fall Risk Level - - - - High fall risk  Patient at Risk for Falls Due to - - - History of fall(s);Impaired balance/gait;Impaired mobility -  Fall risk Follow up - - - Falls evaluation completed;Falls prevention discussed;Education provided -     Vitals:   05/29/21 1051  BP: 136/79  Pulse: 79  Resp: 19  Temp: 97.9 F (36.6 C)  Weight: 164 lb 8 oz (74.6 kg)  Height: 5\' 9"  (1.753 m)   Body mass index is 24.29 kg/m.  Physical Exam  GENERAL APPEARANCE: Well nourished. In no acute distress. Normal body habitus SKIN:  Skin is warm and dry.  MOUTH and THROAT: Lips are without lesions. Oral mucosa is moist and without lesions.  RESPIRATORY: Breathing is even & unlabored, BS CTAB CARDIAC: RRR, no murmur,no extra heart sounds, no edema GI: Abdomen soft, normal BS, no masses, no tenderness NEUROLOGICAL: There is no tremor. Speech is clear. Alert to self, disoriented to time and place. PSYCHIATRIC:  Anxious  Labs  reviewed: Recent Labs    12/26/20 0000 05/05/21 0000 05/09/21 1115  NA 142 143  143 139  K 4.2 4.0  4.0 4.0  CL 106 107  107 105  CO2 28* 26*  26* 26  GLUCOSE  --   --  100*  BUN 18 13  13 15   CREATININE 0.8 0.9  0.9 1.06*  CALCIUM 9.0 9.5  9.5 9.0  MG  --   --  1.9   Recent Labs    12/26/20 0000 05/05/21 0000 05/09/21 1115  AST 16 25  25  32  ALT 7 16  16 19   ALKPHOS 65 111  111 80  BILITOT  --   --  0.3  PROT  --   --  6.6  ALBUMIN 3.3* 3.6 3.5   Recent Labs    07/02/20 0000 10/21/20 0000 12/26/20 0000 01/27/21 0000 05/05/21 0000 05/09/21 1115  WBC 3.6   < > 3.2 3.8 5.5  5.5 4.7  NEUTROABS 0.70  --  0.90  --   --   --   HGB 11.9*   < > 12.1 12.4 12.1  12.1 13.0  HCT 35*   < > 91* 37 38  38 39.4  MCV  --   --   --   --   --  94.3  PLT 95*   < > 86* 120* 160  160 205   < > = values in this interval not displayed.   Lab Results  Component Value Date   TSH 0.731 05/09/2021   Lab Results  Component Value Date   HGBA1C 5.8 04/14/2021   Lab Results  Component Value Date   CHOL 144 07/02/2020   HDL 33 (A) 07/02/2020   LDLCALC 88 07/02/2020   TRIG 113 07/02/2020    Significant Diagnostic Results in last 30 days:  DG Elbow Complete Left  Result Date: 05/09/2021 CLINICAL DATA:  Fall, pain EXAM: LEFT ELBOW - COMPLETE 3+ VIEW COMPARISON:  None. FINDINGS: Suspect a very subtle transversely oriented fracture of the left radial head. No definite fracture. There is a large elbow joint effusion. Soft tissue edema overlying the olecranon. IMPRESSION: 1. Suspect a very subtle transversely oriented fracture of the left radial head. There is however no definite fracture. 2. Large elbow joint effusion, in general strongly associated with radiographically occult fractures of the radial head and neck. 3.  Soft tissue edema overlying the olecranon.  Electronically Signed   By: Delanna Ahmadi M.D.   On: 05/09/2021 13:10   CT HEAD WO CONTRAST  Result Date:  05/09/2021 CLINICAL DATA:  Head trauma, minor. Neck trauma. Additional history provided: Recent falls. EXAM: CT HEAD WITHOUT CONTRAST CT CERVICAL SPINE WITHOUT CONTRAST TECHNIQUE: Multidetector CT imaging of the head and cervical spine was performed following the standard protocol without intravenous contrast. Multiplanar CT image reconstructions of the cervical spine were also generated. COMPARISON:  Brain MRI 03/10/2016. FINDINGS: CT HEAD FINDINGS Brain: Mild generalized cerebral and cerebellar atrophy. Mild patchy and ill-defined hypoattenuation within the cerebral white matter, nonspecific but compatible chronic small vessel ischemic disease. There is no acute intracranial hemorrhage. No demarcated cortical infarct. No extra-axial fluid collection. No evidence of an intracranial mass. No midline shift. Vascular: No hyperdense vessel.  Atherosclerotic calcifications Skull: Normal. Negative for fracture or focal lesion. Sinuses/Orbits: Visualized orbits show no acute finding. No significant paranasal sinus disease at the imaged levels. Other: Subtle right periorbital soft tissue swelling is questioned. CT CERVICAL SPINE FINDINGS Alignment: Reversal of the expected cervical lordosis. No significant spondylolisthesis. Skull base and vertebrae: The basion-dental and atlanto-dental intervals are maintained.No evidence of acute fracture to the cervical spine. Soft tissues and spinal canal: No prevertebral fluid or swelling. No visible canal hematoma. Disc levels: Cervical spondylosis. Most notably at C5-C6, there is advanced disc space narrowing with a posterior disc osteophyte complex and bilateral uncovertebral hypertrophy. No appreciable high-grade spinal canal stenosis. Bony neural foraminal narrowing bilaterally at C6-C7. Upper chest: No consolidation within the imaged lung apices. No visible pneumothorax. Incidentally noted azygos fissure (anatomic variant). IMPRESSION: CT head: 1. No evidence of acute  intracranial abnormality. 2. Mild chronic small-vessel ischemic changes within the cerebral white matter. 3. Mild generalized parenchymal atrophy. 4. Subtle right periorbital soft tissue swelling is questioned. Correlate with findings on direct examination. CT cervical spine: 1. No evidence of acute fracture to the cervical spine. 2. Reversal of the expected cervical lordosis. 3. Cervical spondylosis, as described and greatest at C5-C6 and C6-C7. Electronically Signed   By: Kellie Simmering D.O.   On: 05/09/2021 12:05   CT CERVICAL SPINE WO CONTRAST  Result Date: 05/09/2021 CLINICAL DATA:  Head trauma, minor. Neck trauma. Additional history provided: Recent falls. EXAM: CT HEAD WITHOUT CONTRAST CT CERVICAL SPINE WITHOUT CONTRAST TECHNIQUE: Multidetector CT imaging of the head and cervical spine was performed following the standard protocol without intravenous contrast. Multiplanar CT image reconstructions of the cervical spine were also generated. COMPARISON:  Brain MRI 03/10/2016. FINDINGS: CT HEAD FINDINGS Brain: Mild generalized cerebral and cerebellar atrophy. Mild patchy and ill-defined hypoattenuation within the cerebral white matter, nonspecific but compatible chronic small vessel ischemic disease. There is no acute intracranial hemorrhage. No demarcated cortical infarct. No extra-axial fluid collection. No evidence of an intracranial mass. No midline shift. Vascular: No hyperdense vessel.  Atherosclerotic calcifications Skull: Normal. Negative for fracture or focal lesion. Sinuses/Orbits: Visualized orbits show no acute finding. No significant paranasal sinus disease at the imaged levels. Other: Subtle right periorbital soft tissue swelling is questioned. CT CERVICAL SPINE FINDINGS Alignment: Reversal of the expected cervical lordosis. No significant spondylolisthesis. Skull base and vertebrae: The basion-dental and atlanto-dental intervals are maintained.No evidence of acute fracture to the cervical spine.  Soft tissues and spinal canal: No prevertebral fluid or swelling. No visible canal hematoma. Disc levels: Cervical spondylosis. Most notably at C5-C6, there is advanced disc space narrowing with a posterior disc osteophyte complex and bilateral uncovertebral hypertrophy. No appreciable high-grade  spinal canal stenosis. Bony neural foraminal narrowing bilaterally at C6-C7. Upper chest: No consolidation within the imaged lung apices. No visible pneumothorax. Incidentally noted azygos fissure (anatomic variant). IMPRESSION: CT head: 1. No evidence of acute intracranial abnormality. 2. Mild chronic small-vessel ischemic changes within the cerebral white matter. 3. Mild generalized parenchymal atrophy. 4. Subtle right periorbital soft tissue swelling is questioned. Correlate with findings on direct examination. CT cervical spine: 1. No evidence of acute fracture to the cervical spine. 2. Reversal of the expected cervical lordosis. 3. Cervical spondylosis, as described and greatest at C5-C6 and C6-C7. Electronically Signed   By: Kellie Simmering D.O.   On: 05/09/2021 12:05   DG Pelvis Portable  Result Date: 05/09/2021 CLINICAL DATA:  Fall this morning.  Dementia. EXAM: PORTABLE PELVIS 1-2 VIEWS COMPARISON:  None. FINDINGS: 1208 hours. The bones appear mildly demineralized. No evidence of acute fracture or dislocation. Linear lucency projecting over the right superior pubic ramus extends superiorly beyond the bone, attributed to overlying gas in the rectum. There is prominent stool in the rectum. Mild degenerative changes are present in the hips and sacroiliac joints. There are surgical clips in the right groin. IMPRESSION: No evidence of acute pelvic fracture or dislocation. Electronically Signed   By: Richardean Sale M.D.   On: 05/09/2021 12:19   DG Chest Port 1 View  Result Date: 05/09/2021 CLINICAL DATA:  Fall EXAM: PORTABLE CHEST 1 VIEW COMPARISON:  Portable exam 1207 hours without priors for comparison  FINDINGS: Normal heart size and pulmonary vascularity. Atherosclerotic calcification and tortuosity of thoracic aorta. Bronchitic changes with RIGHT basilar atelectasis. Remaining lungs clear. No infiltrate, pleural effusion, or pneumothorax. As this fissure noted. No acute osseous findings. IMPRESSION: Bronchitic changes with RIGHT basilar atelectasis. Aortic Atherosclerosis (ICD10-I70.0). Electronically Signed   By: Lavonia Dana M.D.   On: 05/09/2021 12:33   DG Shoulder Left  Result Date: 05/09/2021 CLINICAL DATA:  Left shoulder pain after fall. EXAM: LEFT SHOULDER - 2+ VIEW COMPARISON:  None. FINDINGS: There is no evidence of fracture or dislocation. There is no evidence of arthropathy or other focal bone abnormality. Soft tissues are unremarkable. IMPRESSION: Negative. Electronically Signed   By: Marijo Conception M.D.   On: 05/09/2021 13:09    Assessment/Plan  1. Essential hypertension -  BPs stable, continue furosemide  2. Hypothyroidism due to acquired atrophy of thyroid Lab Results  Component Value Date   TSH 0.731 05/09/2021   -    Continue levothyroxine  3. Mood disorder with psychosis ([place]) -    Has occasional yelling and agitation -    Continue Depakote, Ativan and Seroquel  4. Major depression, recurrent, chronic ([facility]) -   PHQ-9 score 0, no depressed mood -    Continue escitalopram  5. Dementia with behavioral disturbance -   BIMS score 3/15, ranging in severe cognitive impairment -   Continue memantine and donepezil -    Continue supportive care   Family/ staff Communication: Discussed plan of care with charge nurse.  Labs/tests ordered:   None  Goals of care:   Long-term care   Durenda Age, DNP, MSN, FNP-BC [facility] [phone] (Monday-Friday 8:00 a.m. - 5:00 p.m.) [phone] (after hours)

## 2021-06-19 ENCOUNTER — Other Ambulatory Visit: Payer: Self-pay | Admitting: Adult Health

## 2021-06-19 MED ORDER — LORAZEPAM 2 MG/ML PO CONC: ORAL | 0 refills | Status: DC

## 2021-06-22 ENCOUNTER — Non-Acute Institutional Stay (SKILLED_NURSING_FACILITY): Payer: Medicare Other | Admitting: Adult Health

## 2021-06-22 ENCOUNTER — Encounter: Payer: Self-pay | Admitting: Adult Health

## 2021-06-22 DIAGNOSIS — F03918 Unspecified dementia, unspecified severity, with other behavioral disturbance: Secondary | ICD-10-CM

## 2021-06-22 DIAGNOSIS — I10 Essential (primary) hypertension: Secondary | ICD-10-CM

## 2021-06-22 DIAGNOSIS — E782 Mixed hyperlipidemia: Secondary | ICD-10-CM

## 2021-06-22 DIAGNOSIS — E034 Atrophy of thyroid (acquired): Secondary | ICD-10-CM

## 2021-06-22 DIAGNOSIS — F339 Major depressive disorder, recurrent, unspecified: Secondary | ICD-10-CM

## 2021-06-22 DIAGNOSIS — F39 Unspecified mood [affective] disorder: Secondary | ICD-10-CM

## 2021-06-22 NOTE — Progress Notes (Signed)
Location:  [place] Room Number: [place] of Service:  SNF (31) Provider:  Durenda Age, DNP, FNP-BC  Patient Care Team: Hendricks Limes, MD as PCP - General (Internal Medicine)  Extended Emergency Contact Information Primary Emergency Contact: Robinson,Roberto Address: [address]          [place], [place] [address] [name] of [place] Phone: [phone] Mobile Phone: [phone] Relation: Son Secondary Emergency Contact: Reid,Cynthia  [place] Mobile Phone: [phone] Relation: Daughter  Code Status:  Full Code  Goals of care: Advanced Directive information Advanced Directives 05/29/2021  Does Patient Have a Medical Advance Directive? Yes  Type of Advance Directive [place]  Does patient want to make changes to medical advance directive? No - Patient declined  Copy of [place] in Chart? Yes - validated most recent copy scanned in chart (See row information)  Would patient like information on creating a medical advance directive? -  Pre-existing out of facility DNR order (yellow form or pink MOST form) -     Chief Complaint  Patient presents with   Medical Management of Chronic Issues    Routine Visit    HPI:  Pt is a 87 y.o. female seen today for medical management of chronic diseases. She is a long-term care resident of [facility]. She has a PMH of dementia, hypothyroidism and hypertension.  Hypothyroidism due to acquired atrophy of thyroid  -  takes levothyroxine 175 mcg 1 tab daily  Essential hypertension -  SBPs ranging from 115-132, takes Furosemide 20 mg 1 tab PO daily on MWFs  Mixed hyperlipidemia -  takes Lipitor 40 mg 1 tab PO at bedtime  Major depression, recurrent, chronic (HCC) -  PHQ-9 score is 0, takes  Escitalopram 10 mg daily   Past Medical History:  Diagnosis Date   Chronic kidney disease    Stage III    Dementia with behavioral disturbance 03/19/2015   Notated on FL2 Form from Dr. Agapito Games [phone]   Depression    previously saw Dr Casimiro Needle   Diabetes mellitus without complication ([place])    99991111 A1c 6%   Dyslipidemia    11/18/15 Tg 74, HDL 60, LDL 90 on statin   Hypertension    Hypothyroidism    11/18/15 TSH 53.6, 02/07/16 TSH 9.42 @ [facility]   MDD (major depressive disorder)    With behavioral disturbance   Scabies    02/21/16 [facility],[place]; Rx: Permethrin cream   Urinary tract infection due to Proteus 05/04/2019   Uniformly sensitive except to nitrofurantoin; Cipro prescribed   Vitamin D deficiency    11/18/15 vitamin D 25-hydroxy 39   Past Surgical History:  Procedure Laterality Date   no record     04/10/15 [facility] note: see old chart    No Known Allergies  Outpatient Encounter Medications as of 06/22/2021  Medication Sig   acetaminophen (TYLENOL) 325 MG tablet Take 325 mg by mouth every 6 (six) hours as needed. TAKE 2 TABLETS =650MG  BY MOUTH EVERY 6 HOURS AS NEEDED - PAIN   aspirin EC 81 MG tablet Take 81 mg by mouth daily.    atorvastatin (LIPITOR) 40 MG tablet Take 40 mg by mouth at bedtime.    bisacodyl (DULCOLAX) 10 MG suppository If not relieved by MOM, give 10 mg Bisacodyl suppositiory rectally X 1 dose in 24 hours as needed (Do not use constipation standing orders for residents with renal failure/CFR  less than 30. Contact MD for orders) (Physician Order)   Calcium Carb-Cholecalciferol (CALCIUM-VITAMIN D3) 600-400 MG-UNIT TABS Take 1 tablet by mouth 2 (two) times daily.   Carboxymethylcellulose Sodium (ARTIFICIAL TEARS OP) Apply to eye. Instill 2 gtts to both eyes twice daily Dx: dry eyes   cholecalciferol (VITAMIN D3) 25 MCG (1000 UNIT) tablet Take 2,000 Units by mouth daily. Take 2 tablet (2000 unit total) by mouth one time daily for vitamin deficiency   divalproex (DEPAKOTE) 125 MG DR tablet Take 125 mg by mouth 2 (two) times daily. x 5 days for  increased agitation   donepezil (ARICEPT) 10 MG tablet Take 10 mg by mouth at bedtime.    escitalopram (LEXAPRO) 10 MG tablet Take 10 mg by mouth daily.   furosemide (LASIX) 20 MG tablet Take 20 mg by mouth every Monday, Wednesday, and Friday.    levothyroxine (SYNTHROID) 175 MCG tablet Take 175 mcg by mouth daily before breakfast.   LORazepam (ATIVAN) 2 MG/ML concentrated solution Give 0.5 mg/ml gel apply topically to skin three times a day   magnesium hydroxide (MILK OF MAGNESIA) 400 MG/5ML suspension If no BM in 3 days, give 30 cc Milk of Magnesium p.o. x 1 dose in 24 hours as needed (Do not use standing constipation orders for residents with renal failure CFR less than 30. Contact MD for orders) (Physician Order)   memantine (NAMENDA) 10 MG tablet Take 10 mg by mouth 2 (two) times daily.    Multiple Vitamin (MULTI-VITAMIN DAILY PO) Take one tablet by mouth once daily   NON FORMULARY DIET: REGULAR, THIN LIQUIDS   Nutritional Supplements (ENSURE ENLIVE PO) ENSURE ENLIVE LIQUID GIVE 8 OUNCE BY MOUTH ONCE DAILY FOR SUPPLEMENT   Nutritional Supplements (NUTRITIONAL SUPPLEMENT PO) Take 1 each by mouth daily. Magic Cup to help stabilize weight   QUEtiapine (SEROQUEL) 25 MG tablet Take 75 mg by mouth 2 (two) times daily. FOR DEPRESSION/AGITATION. MONITOR FOR SEDATION AND HOLD.   saccharomyces boulardii (FLORASTOR) 250 MG capsule Take 250 mg by mouth 2 (two) times daily. FOR 10 DAYS   sennosides-docusate sodium (SENOKOT-S) 8.6-50 MG tablet Take 2 tablets by mouth daily.    Skin Protectants, Misc. (MINERIN) CREA Apply topically to ankles twice a day for dry skin.   Sodium Phosphates (RA SALINE ENEMA RE) If not relieved by Biscodyl suppository, give disposable Saline Enema rectally X 1 dose/24 hrs as needed (Do not use constipation standing orders for residents with renal failure/CFR less than 30. Contact MD for orders)(Physician Or   No facility-administered encounter medications on file as of 06/22/2021.     Review of Systems  Unable to obtain due to dementia   Immunization History  Administered Date(s) Administered   Influenza-Unspecified 02/17/2016, 08/01/2018, 03/18/2019, 04/07/2020, 03/29/2021   Moderna Sars-Covid-2 Vaccination 07/06/2019, 08/03/2019   PPD Test 04/08/2016   Pneumococcal Conjugate-13 01/11/2017   Pneumococcal-Unspecified 02/16/2014   Tdap 02/27/2019, 05/09/2021   Unspecified SARS-COV-2 Vaccination 06/21/2020   Pertinent  Health Maintenance Due  Topic Date Due   FOOT EXAM  01/12/2021   HEMOGLOBIN A1C  10/13/2021   INFLUENZA VACCINE  Completed   OPHTHALMOLOGY EXAM  Discontinued   URINE MICROALBUMIN  Discontinued   DEXA SCAN  Discontinued   Fall Risk 08/07/2016 01/10/2017 01/14/2018 01/16/2019 05/09/2021  Falls in the past year? Yes No No 1 -  Was there an injury with Fall? - - - 0 -  Fall Risk Category Calculator - - - 2 -  Fall Risk Category - - -  Moderate -  Patient Fall Risk Level - - - - High fall risk  Patient at Risk for Falls Due to - - - History of fall(s);Impaired balance/gait;Impaired mobility -  Fall risk Follow up - - - Falls evaluation completed;Falls prevention discussed;Education provided -     Vitals:   06/22/21 1300  BP: 116/72  Pulse: 71  Resp: 20  Temp: 97.7 F (36.5 C)  Weight: 165 lb 12.8 oz (75.2 kg)  Height: 5\' 9"  (1.753 m)   Body mass index is 24.48 kg/m.  Physical Exam Constitutional:      Appearance: She is normal weight.     Comments: Alert to self, disoriented to time and place.  HENT:     Head: Normocephalic and atraumatic.     Mouth/Throat:     Mouth: Mucous membranes are moist.  Eyes:     Conjunctiva/sclera: Conjunctivae normal.  Cardiovascular:     Rate and Rhythm: Normal rate and regular rhythm.  Pulmonary:     Effort: Pulmonary effort is normal.     Breath sounds: Normal breath sounds.  Abdominal:     General: Bowel sounds are normal.     Palpations: Abdomen is soft.  Musculoskeletal:     Cervical back:  Normal range of motion.  Skin:    General: Skin is warm and dry.  Neurological:     Mental Status: She is alert. Mental status is at baseline. She is disoriented.  Psychiatric:        Mood and Affect: Mood normal.       Labs reviewed: Recent Labs    12/26/20 0000 05/05/21 0000 05/09/21 1115  NA 142 143   143 139  K 4.2 4.0   4.0 4.0  CL 106 107   107 105  CO2 28* 26*   26* 26  GLUCOSE  --   --  100*  BUN 18 13   13 15   CREATININE 0.8 0.9   0.9 1.06*  CALCIUM 9.0 9.5   9.5 9.0  MG  --   --  1.9   Recent Labs    12/26/20 0000 05/05/21 0000 05/09/21 1115  AST 16 25   25  32  ALT 7 16   16 19   ALKPHOS 65 111   111 80  BILITOT  --   --  0.3  PROT  --   --  6.6  ALBUMIN 3.3* 3.6 3.5   Recent Labs    07/02/20 0000 10/21/20 0000 12/26/20 0000 01/27/21 0000 05/05/21 0000 05/09/21 1115  WBC 3.6   < > 3.2 3.8 5.5   5.5 4.7  NEUTROABS 0.70  --  0.90  --   --   --   HGB 11.9*   < > 12.1 12.4 12.1   12.1 13.0  HCT 35*   < > 91* 37 38   38 39.4  MCV  --   --   --   --   --  94.3  PLT 95*   < > 86* 120* 160   160 205   < > = values in this interval not displayed.   Lab Results  Component Value Date   TSH 0.731 05/09/2021   Lab Results  Component Value Date   HGBA1C 5.8 04/14/2021   Lab Results  Component Value Date   CHOL 144 07/02/2020   HDL 33 (A) 07/02/2020   LDLCALC 88 07/02/2020   TRIG 113 07/02/2020    Significant Diagnostic Results in last 30 days:  No results found.  Assessment/Plan  1. Hypothyroidism due to acquired atrophy of thyroid Lab Results  Component Value Date   TSH 0.731 05/09/2021   -  continue Levothyroxine  2. Essential hypertension  -Blood pressure well controlled Continue current medications  3. Mixed hyperlipidemia - continue Lipitor  4. Major depression, recurrent, chronic (HCC) -   PHQ-9 score is 0, continue Escitalopram  5. Mood disorder with psychosis ([place]) -  mood is stable, continue Depakote, Seroquel and  Ativan  6. Dementia with behavioral disturbance -  BIMS score 3/15, ranging in severe cognitive deficit -  continue Memantine and Aricept   Family/ staff Communication: Discussed plan of care with charge nurse.  Labs/tests ordered:   lipid panel and A1C    Durenda Age, DNP, MSN, FNP-BC [place] [phone] (Monday-Friday 8:00 a.m. - 5:00 p.m.) [phone] (after hours)

## 2021-06-27 LAB — LIPID PANEL
Cholesterol: 182 (ref 0–200)
HDL: 62 (ref 35–70)
LDL Cholesterol: 89
Triglycerides: 156 (ref 40–160)

## 2021-06-27 LAB — HEMOGLOBIN A1C: Hemoglobin A1C: 5.9

## 2021-07-12 ENCOUNTER — Encounter: Payer: Self-pay | Admitting: Adult Health

## 2021-07-12 ENCOUNTER — Non-Acute Institutional Stay (SKILLED_NURSING_FACILITY): Payer: Medicare Other | Admitting: Adult Health

## 2021-07-12 DIAGNOSIS — I10 Essential (primary) hypertension: Secondary | ICD-10-CM

## 2021-07-12 DIAGNOSIS — E034 Atrophy of thyroid (acquired): Secondary | ICD-10-CM | POA: Diagnosis not present

## 2021-07-12 DIAGNOSIS — F339 Major depressive disorder, recurrent, unspecified: Secondary | ICD-10-CM | POA: Diagnosis not present

## 2021-07-12 DIAGNOSIS — F39 Unspecified mood [affective] disorder: Secondary | ICD-10-CM | POA: Diagnosis not present

## 2021-07-12 DIAGNOSIS — F03918 Unspecified dementia, unspecified severity, with other behavioral disturbance: Secondary | ICD-10-CM

## 2021-07-12 DIAGNOSIS — Z7189 Other specified counseling: Secondary | ICD-10-CM | POA: Diagnosis not present

## 2021-07-12 DIAGNOSIS — E782 Mixed hyperlipidemia: Secondary | ICD-10-CM

## 2021-07-12 NOTE — Progress Notes (Signed)
Location:  [place] Room Number: [place] of Service:  SNF (31) Provider:  Durenda Age, DNP, FNP-BC  Patient Care Team: Hendricks Limes, MD as PCP - General (Internal Medicine)  Extended Emergency Contact Information Primary Emergency Contact: Robinson,Roberto Address: [address]          [place], [place] [address] [name] of [place] Phone: [phone] Mobile Phone: [phone] Relation: Son Secondary Emergency Contact: Reid,Cynthia  [place] Mobile Phone: [phone] Relation: Daughter  Code Status:   Full Code  Goals of care: Advanced Directive information Advanced Directives 05/29/2021  Does Patient Have a Medical Advance Directive? Yes  Type of Advance Directive [place]  Does patient want to make changes to medical advance directive? No - Patient declined  Copy of [place] in Chart? Yes - validated most recent copy scanned in chart (See row information)  Would patient like information on creating a medical advance directive? -  Pre-existing out of facility DNR order (yellow form or pink MOST form) -     Chief Complaint  Patient presents with   Acute Visit    Care Plan Meeting    HPI:  Pt is a 87 y.o. female who had a care plan meeting today attended by social worker X 2, life enrichment, dietary, NP student, life enrichment, son and daughter, April, who attended via telephone conference.  She remains to be full code.  Discussed medications, vital signs and weights.  Daughter is happy with the recent room change.  Son is currently contacting podiatry regarding visits and what has been done.  Latest weight is 165.8 lbs, stable.  She enjoys eating food, snacks occasionally.  She is currently on regular diet and daughter requested for chef salad and water on trace she eats in the hallway wherein it is less busy/chaotic.  She enjoys music, Bible study, one-to-one  talk and nail care.  Daughter requested for resident to be at bingo but she tends to throw the chips.  She attends [employer].  The meeting lasted for 30 minutes.   Past Medical History:  Diagnosis Date   Chronic kidney disease    Stage III   Dementia with behavioral disturbance 03/19/2015   Notated on FL2 Form from Dr. Agapito Games [phone]   Depression    previously saw Dr Casimiro Needle   Diabetes mellitus without complication ([place])    99991111 A1c 6%   Dyslipidemia    11/18/15 Tg 74, HDL 60, LDL 90 on statin   Hypertension    Hypothyroidism    11/18/15 TSH 53.6, 02/07/16 TSH 9.42 @ [facility]   MDD (major depressive disorder)    With behavioral disturbance   Scabies    02/21/16 [facility],[place],; Rx: Permethrin cream   Urinary tract infection due to Proteus 05/04/2019   Uniformly sensitive except to nitrofurantoin; Cipro prescribed   Vitamin D deficiency    11/18/15 vitamin D 25-hydroxy 39   Past Surgical History:  Procedure Laterality Date   no record     04/10/15 [facility] note: see old chart    No Known Allergies  Outpatient Encounter Medications as of 07/12/2021  Medication Sig   acetaminophen (TYLENOL) 325 MG tablet Take 325 mg by mouth every 6 (six) hours as needed. TAKE 2 TABLETS =650MG  BY MOUTH EVERY 6 HOURS AS NEEDED - PAIN   aspirin EC 81 MG tablet Take 81 mg by mouth daily.    atorvastatin (LIPITOR)  40 MG tablet Take 40 mg by mouth at bedtime.    bisacodyl (DULCOLAX) 10 MG suppository If not relieved by MOM, give 10 mg Bisacodyl suppositiory rectally X 1 dose in 24 hours as needed (Do not use constipation standing orders for residents with renal failure/CFR less than 30. Contact MD for orders) (Physician Order)   Calcium Carb-Cholecalciferol (CALCIUM-VITAMIN D3) 600-400 MG-UNIT TABS Take 1 tablet by mouth 2 (two) times daily.   Carboxymethylcellulose Sodium (ARTIFICIAL TEARS OP) Apply to eye. Instill 2 gtts to both eyes twice daily Dx: dry eyes    cholecalciferol (VITAMIN D3) 25 MCG (1000 UNIT) tablet Take 2,000 Units by mouth daily. Take 2 tablet (2000 unit total) by mouth one time daily for vitamin deficiency   divalproex (DEPAKOTE) 125 MG DR tablet Take 125 mg by mouth 2 (two) times daily. x 5 days for increased agitation   donepezil (ARICEPT) 10 MG tablet Take 10 mg by mouth at bedtime.    escitalopram (LEXAPRO) 10 MG tablet Take 10 mg by mouth daily.   furosemide (LASIX) 20 MG tablet Take 20 mg by mouth every Monday, Wednesday, and Friday.    levothyroxine (SYNTHROID) 175 MCG tablet Take 175 mcg by mouth daily before breakfast.   LORazepam (ATIVAN) 2 MG/ML concentrated solution Give 0.5 mg/ml gel apply topically to skin three times a day   magnesium hydroxide (MILK OF MAGNESIA) 400 MG/5ML suspension If no BM in 3 days, give 30 cc Milk of Magnesium p.o. x 1 dose in 24 hours as needed (Do not use standing constipation orders for residents with renal failure CFR less than 30. Contact MD for orders) (Physician Order)   memantine (NAMENDA) 10 MG tablet Take 10 mg by mouth 2 (two) times daily.    Multiple Vitamin (MULTI-VITAMIN DAILY PO) Take one tablet by mouth once daily   NON FORMULARY DIET: REGULAR, THIN LIQUIDS   Nutritional Supplements (ENSURE ENLIVE PO) ENSURE ENLIVE LIQUID GIVE 8 OUNCE BY MOUTH ONCE DAILY FOR SUPPLEMENT   Nutritional Supplements (NUTRITIONAL SUPPLEMENT PO) Take 1 each by mouth daily. Magic Cup to help stabilize weight   QUEtiapine (SEROQUEL) 25 MG tablet Take 75 mg by mouth 2 (two) times daily. FOR DEPRESSION/AGITATION. MONITOR FOR SEDATION AND HOLD.   saccharomyces boulardii (FLORASTOR) 250 MG capsule Take 250 mg by mouth 2 (two) times daily. FOR 10 DAYS   sennosides-docusate sodium (SENOKOT-S) 8.6-50 MG tablet Take 2 tablets by mouth daily.    Skin Protectants, Misc. (MINERIN) CREA Apply topically to ankles twice a day for dry skin.   Sodium Phosphates (RA SALINE ENEMA RE) If not relieved by Biscodyl suppository,  give disposable Saline Enema rectally X 1 dose/24 hrs as needed (Do not use constipation standing orders for residents with renal failure/CFR less than 30. Contact MD for orders)(Physician Or   No facility-administered encounter medications on file as of 07/12/2021.    Review of Systems  Unable to obtain due to severe dementia.    Immunization History  Administered Date(s) Administered   Influenza-Unspecified 02/17/2016, 08/01/2018, 03/18/2019, 04/07/2020, 03/29/2021   Moderna Sars-Covid-2 Vaccination 07/06/2019, 08/03/2019   PPD Test 04/08/2016   Pneumococcal Conjugate-13 01/11/2017   Pneumococcal-Unspecified 02/16/2014   Tdap 02/27/2019, 05/09/2021   Unspecified SARS-COV-2 Vaccination 06/21/2020   Pertinent  Health Maintenance Due  Topic Date Due   FOOT EXAM  09/12/2021   HEMOGLOBIN A1C  10/13/2021   INFLUENZA VACCINE  Completed   OPHTHALMOLOGY EXAM  Discontinued   URINE MICROALBUMIN  Discontinued   DEXA SCAN  Discontinued   Fall Risk 08/07/2016 01/10/2017 01/14/2018 01/16/2019 05/09/2021  Falls in the past year? Yes No No 1 -  Was there an injury with Fall? - - - 0 -  Fall Risk Category Calculator - - - 2 -  Fall Risk Category - - - Moderate -  Patient Fall Risk Level - - - - High fall risk  Patient at Risk for Falls Due to - - - History of fall(s);Impaired balance/gait;Impaired mobility -  Fall risk Follow up - - - Falls evaluation completed;Falls prevention discussed;Education provided -     Vitals:   07/12/21 1400  BP: 124/63  Pulse: 66  Resp: 18  Temp: (!) 97.1 F (36.2 C)  Weight: 165 lb 9.6 oz (75.1 kg)  Height: 5\' 9"  (1.753 m)   Body mass index is 24.45 kg/m.  Physical Exam Constitutional:      General: She is not in acute distress.    Appearance: Normal appearance.  HENT:     Head: Normocephalic and atraumatic.     Nose: Nose normal.     Mouth/Throat:     Mouth: Mucous membranes are moist.  Eyes:     Conjunctiva/sclera: Conjunctivae normal.   Cardiovascular:     Rate and Rhythm: Normal rate and regular rhythm.  Pulmonary:     Effort: Pulmonary effort is normal.     Breath sounds: Normal breath sounds.  Abdominal:     General: Bowel sounds are normal.     Palpations: Abdomen is soft.  Musculoskeletal:        General: No swelling.     Cervical back: Normal range of motion.  Skin:    General: Skin is warm and dry.  Neurological:     Mental Status: She is alert. Mental status is at baseline. She is disoriented.  Psychiatric:        Mood and Affect: Mood normal.       Labs reviewed: Recent Labs    12/26/20 0000 05/05/21 0000 05/09/21 1115  NA 142 143   143 139  K 4.2 4.0   4.0 4.0  CL 106 107   107 105  CO2 28* 26*   26* 26  GLUCOSE  --   --  100*  BUN 18 13   13 15   CREATININE 0.8 0.9   0.9 1.06*  CALCIUM 9.0 9.5   9.5 9.0  MG  --   --  1.9   Recent Labs    12/26/20 0000 05/05/21 0000 05/09/21 1115  AST 16 25   25  32  ALT 7 16   16 19   ALKPHOS 65 111   111 80  BILITOT  --   --  0.3  PROT  --   --  6.6  ALBUMIN 3.3* 3.6 3.5   Recent Labs    12/26/20 0000 01/27/21 0000 05/05/21 0000 05/09/21 1115  WBC 3.2 3.8 5.5   5.5 4.7  NEUTROABS 0.90  --   --   --   HGB 12.1 12.4 12.1   12.1 13.0  HCT 91* 37 38   38 39.4  MCV  --   --   --  94.3  PLT 86* 120* 160   160 205   Lab Results  Component Value Date   TSH 0.731 05/09/2021   Lab Results  Component Value Date   HGBA1C 5.8 04/14/2021   Lab Results  Component Value Date   CHOL 144 07/02/2020   HDL 33 (A) 07/02/2020  [place] 88 07/02/2020   TRIG 113 07/02/2020    Significant Diagnostic Results in last 30 days:  No results found.  Assessment/Plan  1. ACP (advance care planning) -   Remains to be full code -   Discussed medications, vital signs and weights  2. Hypothyroidism due to acquired atrophy of thyroid Lab Results  Component Value Date   TSH 0.731 05/09/2021   -   Continue levothyroxine  3. Mixed hyperlipidemia Lab  Results  Component Value Date   CHOL 144 07/02/2020   HDL 33 (A) 07/02/2020   LDLCALC 88 07/02/2020   TRIG 113 07/02/2020   -   Continue Lipitor  4. Major depression, recurrent, chronic (HCC) -PHQ score 0, no depressed mood -   Continue escitalopram  5. Mood disorder with psychosis ([place]) -   Stable, continue quetiapine and Ativan Gel  6. Essential hypertension -   BPs stable, not on pain medication  7. Dementia with behavioral disturbance -  BIMS score 3/15, ranging in severe cognitive impairment -   Continue memantine and donepezil -    Continue supportive care   Family/ staff Communication: Discussed plan of care with son, daughter and IDT.  Labs/tests ordered: None    Durenda Age, DNP, MSN, FNP-BC [facility] [phone] (Monday-Friday 8:00 a.m. - 5:00 p.m.) [phone] (after hours)

## 2021-07-13 LAB — CBC: RBC: 4.24 (ref 3.87–5.11)

## 2021-07-13 LAB — COMPREHENSIVE METABOLIC PANEL
Albumin: 3.8 (ref 3.5–5.0)
Calcium: 9.1 (ref 8.7–10.7)
GFR calc Af Amer: 80.74
GFR calc non Af Amer: 69.66
Globulin: 2.7

## 2021-07-13 LAB — CBC AND DIFFERENTIAL
HCT: 39 (ref 36–46)
Hemoglobin: 13.1 (ref 12.0–16.0)
Neutrophils Absolute: 0.8
Platelets: 118 — AB (ref 150–399)
WBC: 3.6

## 2021-07-13 LAB — HEPATIC FUNCTION PANEL
ALT: 11 (ref 7–35)
AST: 21 (ref 13–35)
Alkaline Phosphatase: 87 (ref 25–125)
Bilirubin, Total: 0.3

## 2021-07-13 LAB — BASIC METABOLIC PANEL
BUN: 21 (ref 4–21)
CO2: 27 — AB (ref 13–22)
Chloride: 106 (ref 99–108)
Creatinine: 0.8 (ref 0.5–1.1)
Glucose: 107
Potassium: 4 (ref 3.4–5.3)
Sodium: 142 (ref 137–147)

## 2021-07-13 LAB — TSH: TSH: 0.72 (ref 0.41–5.90)

## 2021-07-18 ENCOUNTER — Other Ambulatory Visit: Payer: Self-pay | Admitting: Adult Health

## 2021-07-18 MED ORDER — LORAZEPAM 2 MG/ML PO CONC: ORAL | 0 refills | Status: DC

## 2021-07-26 ENCOUNTER — Encounter: Payer: Self-pay | Admitting: Adult Health

## 2021-07-26 ENCOUNTER — Non-Acute Institutional Stay (SKILLED_NURSING_FACILITY): Payer: Medicare Other | Admitting: Adult Health

## 2021-07-26 DIAGNOSIS — R7303 Prediabetes: Secondary | ICD-10-CM

## 2021-07-26 DIAGNOSIS — E034 Atrophy of thyroid (acquired): Secondary | ICD-10-CM

## 2021-07-26 DIAGNOSIS — F39 Unspecified mood [affective] disorder: Secondary | ICD-10-CM

## 2021-07-26 DIAGNOSIS — F03918 Unspecified dementia, unspecified severity, with other behavioral disturbance: Secondary | ICD-10-CM

## 2021-07-26 DIAGNOSIS — Z23 Encounter for immunization: Secondary | ICD-10-CM

## 2021-07-26 DIAGNOSIS — I10 Essential (primary) hypertension: Secondary | ICD-10-CM | POA: Diagnosis not present

## 2021-07-26 DIAGNOSIS — F339 Major depressive disorder, recurrent, unspecified: Secondary | ICD-10-CM

## 2021-07-26 NOTE — Progress Notes (Signed)
Location:  [place] Room Number: [place]:  SNF (31) Provider:  Durenda Age, DNP, FNP-BC  Patient Care Team: Hendricks Limes, MD as PCP - General (Internal Medicine)  Extended Emergency Contact Information Primary Emergency Contact: Kelly [name] Address: [address]          [place], [place] [address] [name] of [place] Phone: [phone] Mobile Phone: [phone] Relation: Son Secondary Emergency Contact: Kelly [name]  [place] Mobile Phone: [phone] Relation: Daughter  Code Status:  FULL CODE  Goals of care: Advanced Directive information Advanced Directives 07/26/2021  Does Patient Have a Medical Advance Directive? Yes  Type of Advance Directive [place]  Does patient want to make changes to medical advance directive? No - Patient declined  Copy of [place] in Chart? Yes - validated most recent copy scanned in chart (See row information)  Would patient like information on creating a medical advance directive? -  Pre-existing out of facility DNR order (yellow form or pink MOST form) -     Chief Complaint  Patient presents with   Medical Management of Chronic Issues    Routine follow up visit.   Health Maintenance    Shingrix vaccine, 3rd COVID booster    HPI:  Pt is a 87 y.o. female seen today for medical management of chronic diseases. She is a long-term care resident of [facility]. She has a PMH of dementia, hypothyroidism and hypertension.  Prediabetes  -  latest A1C  5.9, not on any medication    Essential hypertension  -    SBPs ranging from 124-133, with outlier 150, takes furosemide 20 mg on MWF  Hypothyroidism due to acquired atrophy of thyroid -takes levothyroxine 175 mcg 1 tab daily  Mood disorder with psychosis (HCC) -   agitation episodes reported to have decreased, takes divalproex DR 125 mg 1 capsule 4  times a day and 0.5 mg/ML gel topically 3 times daily  Major depression, recurrent, chronic (HCC) -  PHQ- 9 score is 0, no depressed mood, takes escitalopram 10 mg daily and quetiapine 25 mg take 3 tabs = 75 mg twice a day   Past Medical History:  Diagnosis Date   Chronic kidney disease    Stage III   Dementia with behavioral disturbance 03/19/2015   Notated on FL2 Form from Dr. Agapito [name] [phone]   Depression    previously saw Dr Kelly Needle   Diabetes mellitus without complication ([place])    99991111 A1c 6%   Dyslipidemia    11/18/15 Tg 74, HDL 60, LDL 90 on statin   Hypertension    Hypothyroidism    11/18/15 TSH 53.6, 02/07/16 TSH 9.42 @ [facility]   MDD (major depressive disorder)    With behavioral disturbance   Scabies    02/21/16 [facility],[place]; Rx: Permethrin cream   Urinary tract infection due to Proteus 05/04/2019   Uniformly sensitive except to nitrofurantoin; Cipro prescribed   Vitamin D deficiency    11/18/15 vitamin D 25-hydroxy 39   Past Surgical History:  Procedure Laterality Date   no record     04/10/15 [facility] note: see old chart    No Known Allergies  Outpatient Encounter Medications as of 07/26/2021  Medication Sig   acetaminophen (TYLENOL) 325 MG tablet Take 325 mg by mouth every 6 (six) hours as needed. TAKE 2 TABLETS =650MG  BY MOUTH EVERY 6 HOURS AS NEEDED - PAIN   aspirin  EC 81 MG tablet Take 81 mg by mouth daily.    atorvastatin (LIPITOR) 40 MG tablet Take 40 mg by mouth at bedtime.    bisacodyl (DULCOLAX) 10 MG suppository If not relieved by MOM, give 10 mg Bisacodyl suppositiory rectally X 1 dose in 24 hours as needed (Do not use constipation standing orders for residents with renal failure/CFR less than 30. Contact MD for orders) (Physician Order)   Calcium Carb-Cholecalciferol (CALCIUM-VITAMIN D3) 600-400 MG-UNIT TABS Take 1 tablet by mouth 2 (two) times daily.   Carboxymethylcellulose Sodium (ARTIFICIAL TEARS OP) Apply to eye. Instill 2  gtts to both eyes twice daily Dx: dry eyes   cholecalciferol (VITAMIN D3) 25 MCG (1000 UNIT) tablet Take 2,000 Units by mouth daily. Take 2 tablet (2000 unit total) by mouth one time daily for vitamin deficiency   divalproex (DEPAKOTE) 125 MG DR tablet Take 125 mg by mouth 4 (four) times daily. x 5 days for increased agitation   donepezil (ARICEPT) 10 MG tablet Take 10 mg by mouth at bedtime.    escitalopram (LEXAPRO) 5 MG/5ML solution Take 10 mg by mouth daily.   furosemide (LASIX) 20 MG tablet Take 20 mg by mouth every Monday, Wednesday, and Friday.    levothyroxine (SYNTHROID) 175 MCG tablet Take 175 mcg by mouth daily before breakfast.   LORazepam (ATIVAN) 2 MG/ML concentrated solution Give 0.5 mg/ml gel apply topically to skin three times a day   magnesium hydroxide (MILK OF MAGNESIA) 400 MG/5ML suspension If no BM in 3 days, give 30 cc Milk of Magnesium p.o. x 1 dose in 24 hours as needed (Do not use standing constipation orders for residents with renal failure CFR less than 30. Contact MD for orders) (Physician Order)   memantine (NAMENDA) 10 MG tablet Take 10 mg by mouth 2 (two) times daily.    Multiple Vitamin (MULTI-VITAMIN DAILY PO) Take one tablet by mouth once daily   NON FORMULARY DIET: REGULAR, THIN LIQUIDS   Nutritional Supplements (ENSURE ENLIVE PO) ENSURE ENLIVE LIQUID GIVE 8 OUNCE BY MOUTH ONCE DAILY FOR SUPPLEMENT   Nutritional Supplements (NUTRITIONAL SUPPLEMENT PO) Take 1 each by mouth daily. Magic Cup to help stabilize weight   QUEtiapine (SEROQUEL) 25 MG tablet Take 75 mg by mouth 2 (two) times daily. FOR DEPRESSION/AGITATION. MONITOR FOR SEDATION AND HOLD.   saccharomyces boulardii (FLORASTOR) 250 MG capsule Take 250 mg by mouth 2 (two) times daily. FOR 10 DAYS   sennosides-docusate sodium (SENOKOT-S) 8.6-50 MG tablet Take 2 tablets by mouth daily.    Skin Protectants, Misc. (MINERIN) CREA Apply topically to ankles twice a day for dry skin.   Sodium Phosphates (RA SALINE  ENEMA RE) If not relieved by Biscodyl suppository, give disposable Saline Enema rectally X 1 dose/24 hrs as needed (Do not use constipation standing orders for residents with renal failure/CFR less than 30. Contact MD for orders)(Physician Or   [DISCONTINUED] escitalopram (LEXAPRO) 10 MG tablet Take 10 mg by mouth daily.   No facility-administered encounter medications on file as of 07/26/2021.    Review of Systems   Unable toto obtain due to dementia   Immunization History  Administered Date(s) Administered   Influenza-Unspecified 02/17/2016, 08/01/2018, 03/18/2019, 04/07/2020, 03/29/2021   Moderna Sars-Covid-2 Vaccination 07/06/2019, 07/06/2019, 08/03/2019   PPD Test 04/08/2016   Pneumococcal Conjugate-13 01/11/2017   Pneumococcal-Unspecified 02/16/2014   Tdap 02/27/2019, 05/09/2021   Unspecified SARS-COV-2 Vaccination 06/21/2020   Pertinent  Health Maintenance Due  Topic Date Due   FOOT EXAM  09/12/2021  HEMOGLOBIN A1C  10/13/2021   INFLUENZA VACCINE  Completed   OPHTHALMOLOGY EXAM  Discontinued   URINE MICROALBUMIN  Discontinued   DEXA SCAN  Discontinued   Fall Risk 08/07/2016 01/10/2017 01/14/2018 01/16/2019 05/09/2021  Falls in the past year? Yes No No 1 -  Was there an injury with Fall? - - - 0 -  Fall Risk Category Calculator - - - 2 -  Fall Risk Category - - - Moderate -  Patient Fall Risk Level - - - - High fall risk  Patient at Risk for Falls Due to - - - History of fall(s);Impaired balance/gait;Impaired mobility -  Fall risk Follow up - - - Falls evaluation completed;Falls prevention discussed;Education provided -     Vitals:   07/26/21 1501  BP: 132/76  Pulse: 74  Resp: 18  Temp: 97.6 F (36.4 C)  Weight: 175 lb (79.4 kg)  Height: 5\' 9"  (1.753 m)   Body mass index is 25.84 kg/m.  Physical Exam Constitutional:      General: She is not in acute distress.    Appearance: Normal appearance.  HENT:     Head: Normocephalic and atraumatic.     Nose: Nose  normal.     Mouth/Throat:     Mouth: Mucous membranes are moist.  Eyes:     Conjunctiva/sclera: Conjunctivae normal.  Cardiovascular:     Rate and Rhythm: Normal rate and regular rhythm.  Pulmonary:     Effort: Pulmonary effort is normal.     Breath sounds: Normal breath sounds.  Abdominal:     General: Bowel sounds are normal.     Palpations: Abdomen is soft.  Musculoskeletal:        General: No swelling. Normal range of motion.     Cervical back: Normal range of motion.  Skin:    General: Skin is warm and dry.  Neurological:     General: No focal deficit present.     Mental Status: Mental status is at baseline. She is disoriented.  Psychiatric:        Mood and Affect: Mood normal.        Behavior: Behavior normal.       Labs reviewed: Recent Labs    12/26/20 0000 05/05/21 0000 05/09/21 1115  NA 142 143   143 139  K 4.2 4.0   4.0 4.0  CL 106 107   107 105  CO2 28* 26*   26* 26  GLUCOSE  --   --  100*  BUN 18 13   13 15   CREATININE 0.8 0.9   0.9 1.06*  CALCIUM 9.0 9.5   9.5 9.0  MG  --   --  1.9   Recent Labs    12/26/20 0000 05/05/21 0000 05/09/21 1115  AST 16 25   25  32  ALT 7 16   16 19   ALKPHOS 65 111   111 80  BILITOT  --   --  0.3  PROT  --   --  6.6  ALBUMIN 3.3* 3.6 3.5   Recent Labs    12/26/20 0000 01/27/21 0000 05/05/21 0000 05/09/21 1115  WBC 3.2 3.8 5.5   5.5 4.7  NEUTROABS 0.90  --   --   --   HGB 12.1 12.4 12.1   12.1 13.0  HCT 91* 37 38   38 39.4  MCV  --   --   --  94.3  PLT 86* 120* 160   160 205   Lab  Results  Component Value Date   TSH 0.731 05/09/2021   Lab Results  Component Value Date   HGBA1C 5.8 04/14/2021   Lab Results  Component Value Date   CHOL 144 07/02/2020   HDL 33 (A) 07/02/2020   LDLCALC 88 07/02/2020   TRIG 113 07/02/2020    Significant Diagnostic Results in last 30 days:  No results found.  Assessment/Plan  1. Prediabetes Lab Results  Component Value Date   HGBA1C 5.9 06/27/2021   -   diet-controlled  2. Essential hypertension -Blood pressure well controlled Continue current medications  3. Hypothyroidism due to acquired atrophy of thyroid Lab Results  Component Value Date   TSH 0.72 07/13/2021   -Continue levothyroxine  4. Mood disorder with psychosis ([place]) -Mood is stable, continue Depakote and Ativan gel  5. Major depression, recurrent, chronic (HCC) -No depressed mood, continue escitalopram and quetiapine  6. Dementia with behavioral disturbance -  BIMS score 3/15, ranging in severe cognitive impairment -   Continue donepezil and memantine  7.  Need for shingles vaccine -Shingrix 0.5 mL IM x1     Family/ staff Communication: Discussed plan of care with charge nurse  Labs/tests ordered:   None    Durenda Age, DNP, MSN, FNP-BC [facility] [phone] (Monday-Friday 8:00 a.m. - 5:00 p.m.) [phone] (after hours)

## 2021-08-17 ENCOUNTER — Other Ambulatory Visit: Payer: Self-pay | Admitting: Adult Health

## 2021-08-17 MED ORDER — LORAZEPAM 2 MG/ML PO CONC: ORAL | 0 refills | Status: DC

## 2021-09-06 ENCOUNTER — Encounter: Payer: Self-pay | Admitting: Adult Health

## 2021-09-06 NOTE — Progress Notes (Signed)
This encounter was created in error - please disregard.

## 2021-09-11 ENCOUNTER — Encounter: Payer: Self-pay | Admitting: Internal Medicine

## 2021-09-11 ENCOUNTER — Non-Acute Institutional Stay (SKILLED_NURSING_FACILITY): Payer: Medicare Other | Admitting: Internal Medicine

## 2021-09-11 DIAGNOSIS — E034 Atrophy of thyroid (acquired): Secondary | ICD-10-CM | POA: Diagnosis not present

## 2021-09-11 DIAGNOSIS — N183 Chronic kidney disease, stage 3 unspecified: Secondary | ICD-10-CM

## 2021-09-11 DIAGNOSIS — D696 Thrombocytopenia, unspecified: Secondary | ICD-10-CM

## 2021-09-11 DIAGNOSIS — E1322 Other specified diabetes mellitus with diabetic chronic kidney disease: Secondary | ICD-10-CM

## 2021-09-11 DIAGNOSIS — D61818 Other pancytopenia: Secondary | ICD-10-CM | POA: Diagnosis not present

## 2021-09-11 DIAGNOSIS — E441 Mild protein-calorie malnutrition: Secondary | ICD-10-CM

## 2021-09-11 DIAGNOSIS — I129 Hypertensive chronic kidney disease with stage 1 through stage 4 chronic kidney disease, or unspecified chronic kidney disease: Secondary | ICD-10-CM

## 2021-09-11 NOTE — Progress Notes (Signed)
? ?  NURSING HOME LOCATION:  [facility] ?ROOM NUMBER:  117 A ? ?CODE STATUS:  Full Code ? ?PCP: Durenda Age NP ? ?This is a nursing facility follow up visit  of chronic medical diagnoses & to document compliance with Regulation 483.30 (c) in [employer] [place] Phase [employer] which mandates caregiver visit ( visits can alternate among physician, PA or NP as per statutes) within 10 days of 30 days / 60 days/ 90 days post admission to SNF date   ? ?Interim medical record and care since last SNF visit was updated with review of diagnostic studies and change in clinical status since last visit were documented. ? ?HPI: She is a permanent resident of facility with medical diagnoses of dyslipidemia, vitamin D deficiency, hypothyroidism, diabetes with CKD, essential hypertension, and dementia with behavioral disturbances. ?Most recent labs were completed in January 2023; creatinine was 0.8 with a GFR of 80.74 indicating high stage II CKD.  Lipids are current; LDL was 89.  CBC was normal; mild thrombocytopenia was present with a platelet count of 118,000.  A1c was prediabetic at 5.9%.  TSH was 0.72, low therapeutic. ? ?Review of systems: Dementia prevented  completion of review of systems.  She can provide no meaningful history and has difficulty following commands.  Her only intelligible  verbal interaction was "ice cream".   ? ?Physical exam:  ?Pertinent or positive findings: She sits in a lounge chair near the Nurses' desk without focus.  She exhibits  purposeless restlessness of the lower extremities . She exhibits a wide-eyed stare. There is intermittent mastication maneuver of the mandible and maxilla & athetoid movements of the tongue.  There is minimal chin hirsutism.  Heart sounds are distant and rate is slow.  Breath sounds are decreased.  Pedal pulses are decreased.  Interosseous wasting of the hands is present. ? ?General appearance: Adequately nourished; no acute distress,  increased work of breathing is present.   ?Lymphatic: No lymphadenopathy about the head, neck, axilla. ?Eyes: No conjunctival inflammation or lid edema is present. There is no scleral icterus. ?Ears:  External ear exam shows no significant lesions or deformities.   ?Nose:  External nasal examination shows no deformity or inflammation. Nasal mucosa are pink and moist without lesions, exudates ?Oral exam:  Lips and gums are healthy appearing. ?Neck:  No thyromegaly, masses, tenderness noted.    ?Heart:  No gallop, murmur, click, rub .  ?Lungs:  without wheezes, rhonchi, rales, rubs. ?Abdomen: Bowel sounds are normal. Abdomen is soft and nontender with no organomegaly, hernias, masses. ?GU: Deferred  ?Extremities:  No cyanosis, clubbing, edema  ?Neurologic exam :Balance, Rhomberg, finger to nose testing could not be completed due to clinical state ?Skin: Warm & dry w/o tenting. ?No significant lesions or rash. ? ?See summary under each active problem in the Problem List with associated updated therapeutic plan ? ? ?

## 2021-09-13 NOTE — Patient Instructions (Signed)
See assessment and plan under each diagnosis in the problem list and acutely for this visit 

## 2021-09-13 NOTE — Assessment & Plan Note (Signed)
Leukopenia & anemia resolved. Mild thrombocytopenia persists but is not progressive. ?

## 2021-09-13 NOTE — Assessment & Plan Note (Signed)
GFR now 80.74 ;high Stage 2 . A1c now prediabetic @ 5.9%. No change indicated. ?

## 2021-09-13 NOTE — Assessment & Plan Note (Signed)
Current albumin &  total protein normal. Clinically she is adequately nourished. ?

## 2021-09-13 NOTE — Assessment & Plan Note (Addendum)
Current platelet count 118,000; no bleeding dyscrasias reported. ?

## 2021-09-13 NOTE — Assessment & Plan Note (Signed)
Current TSH low therapeutic @ 0.72 on 175 mcg of L thyroxine. Recheck TSH next month & reduce dose if still < 1.0 ?

## 2021-09-16 ENCOUNTER — Other Ambulatory Visit: Payer: Self-pay | Admitting: Adult Health

## 2021-09-16 LAB — CBC AND DIFFERENTIAL
HCT: 40 (ref 36–46)
Hemoglobin: 13 (ref 12.0–16.0)
Platelets: 124 10*3/uL — AB (ref 150–400)
WBC: 3.7

## 2021-09-16 LAB — CBC: RBC: 4.31 (ref 3.87–5.11)

## 2021-09-16 MED ORDER — LORAZEPAM 2 MG/ML PO CONC: ORAL | 0 refills | Status: DC

## 2021-10-03 ENCOUNTER — Encounter: Payer: Self-pay | Admitting: Adult Health

## 2021-10-03 ENCOUNTER — Non-Acute Institutional Stay (SKILLED_NURSING_FACILITY): Payer: Medicare Other | Admitting: Adult Health

## 2021-10-03 DIAGNOSIS — F39 Unspecified mood [affective] disorder: Secondary | ICD-10-CM | POA: Diagnosis not present

## 2021-10-03 DIAGNOSIS — F03918 Unspecified dementia, unspecified severity, with other behavioral disturbance: Secondary | ICD-10-CM | POA: Diagnosis not present

## 2021-10-03 DIAGNOSIS — E034 Atrophy of thyroid (acquired): Secondary | ICD-10-CM | POA: Diagnosis not present

## 2021-10-03 DIAGNOSIS — I10 Essential (primary) hypertension: Secondary | ICD-10-CM | POA: Diagnosis not present

## 2021-10-03 NOTE — Progress Notes (Addendum)
? ?Location:  [facility] ?Nursing Home Room Number: 117-A ?Place of Service:  SNF (31) ?Provider:  Durenda Age, DNP, FNP-BC ? ?Patient Care Team: ?Hendricks Limes, MD as PCP - General (Internal Medicine) ? ?Extended Emergency Contact Information ?Primary Emergency Contact: Robinson,Roberto ?Address: [address] ?         [place], [place] [address] [place] ?Home Phone: [phone] ?Mobile Phone: [phone] ?Relation: Son ?Secondary Emergency Contact: Reid,Cynthia ? [place] ?Mobile Phone: [phone] ?Relation: Daughter ? ?Code Status:  Full Code ? ?Goals of care: Advanced Directive information ? ?  10/03/2021  ? 11:00 AM  ?Advanced Directives  ?Does Patient Have a Medical Advance Directive? Yes  ?Type of Advance Directive Healthcare Power of Attorney  ?Does patient want to make changes to medical advance directive? No - Patient declined  ?Copy of [place] in Chart? Yes - validated most recent copy scanned in chart (See row information)  ? ? ? ?Chief Complaint  ?Patient presents with  ? Medical Management of Chronic Issues  ?  Routine Visit  ? ? ?HPI:  ?Pt is a 87 y.o. female seen today for medical management of chronic diseases. She is a long-term care resident of [facility]. ? ?Essential hypertension  -  SBPs ranging from 123 to 146, takes Furosemide 20 mg 1 tab daily on MWF ? ?Hypothyroidism due to acquired atrophy of thyroid -   takes levothyroxine 175 mcg 1 tab daily ? ?Mood disorder with psychosis ([place]) -   takes escitalopram 10 mg daily, divalproex DR 125 mg 4 times a day, Ativan 0.5 mg gel to skin TID and Quetiapine 75 mg twice a day ? ?Dementia with behavioral disturbance (HCC) -  BIMS score 3/15, takes memantine 10 mg 1 tab twice a day and donepezil 10 mg 1 tab at bedtime ? ? ? ?Past Medical History:  ?Diagnosis Date  ? Chronic kidney disease   ? Stage III  ? Dementia with behavioral disturbance ([place])  03/19/2015  ? Notated on FL2 Form from Dr. Agapito Games [phone]  ? Depression   ? previously saw Dr Casimiro Needle  ? Diabetes mellitus without complication ([place])   ? 99991111 A1c 6%  ? Dyslipidemia   ? 11/18/15 Tg 74, HDL 60, LDL 90 on statin  ? Hypertension   ? Hypothyroidism   ? 11/18/15 TSH 53.6, 02/07/16 TSH 9.42 @ [facility]  ? MDD (major depressive disorder)   ? With behavioral disturbance  ? Scabies   ? 02/21/16 [facility],[place]; Rx: Permethrin cream  ? Urinary tract infection due to Proteus 05/04/2019  ? Uniformly sensitive except to nitrofurantoin; Cipro prescribed  ? Vitamin D deficiency   ? 11/18/15 vitamin D 25-hydroxy 39  ? ?Past Surgical History:  ?Procedure Laterality Date  ? no record    ? 04/10/15 [facility] note: see old chart  ? ? ?No Known Allergies ? ?Outpatient Encounter Medications as of 10/03/2021  ?Medication Sig  ? acetaminophen (TYLENOL) 325 MG tablet Take 325 mg by mouth every 6 (six) hours as needed. TAKE 2 TABLETS =650MG  BY MOUTH EVERY 6 HOURS AS NEEDED - PAIN  ? aspirin EC 81 MG tablet Take 81 mg by mouth daily.   ? atorvastatin (LIPITOR) 40 MG tablet Take 40 mg by mouth at bedtime.   ? bisacodyl (DULCOLAX) 10 MG suppository If not relieved by MOM, give 10 mg Bisacodyl suppositiory rectally X 1 dose in 24 hours as needed (Do not use  constipation standing orders for residents with renal failure/CFR less than 30. Contact MD for orders) (Physician Order)  ? Calcium Carb-Cholecalciferol (CALCIUM-VITAMIN D3) 600-400 MG-UNIT TABS Take 1 tablet by mouth 2 (two) times daily.  ? Carboxymethylcellulose Sodium (ARTIFICIAL TEARS OP) Apply to eye. Instill 2 gtts to both eyes twice daily Dx: dry eyes  ? cholecalciferol (VITAMIN D3) 25 MCG (1000 UNIT) tablet Take 2,000 Units by mouth daily. Take 2 tablet (2000 unit total) by mouth one time daily for vitamin deficiency  ? divalproex (DEPAKOTE) 125 MG DR tablet Take 125 mg by mouth 4 (four) times daily.  ? donepezil (ARICEPT) 10 MG tablet Take 10 mg by  mouth at bedtime.   ? escitalopram (LEXAPRO) 5 MG/5ML solution Take 10 mg by mouth daily.  ? feeding supplement (BOOST HIGH PROTEIN) LIQD Take 1 Container by mouth daily. IF ENSURE IS UNAVAILABLE  ? furosemide (LASIX) 20 MG tablet Take 20 mg by mouth every Monday, Wednesday, and Friday.   ? levothyroxine (SYNTHROID) 175 MCG tablet Take 175 mcg by mouth daily before breakfast.  ? LORazepam (ATIVAN) 2 MG/ML concentrated solution Give 0.5 mg/ml gel apply topically to skin three times a day  ? magnesium hydroxide (MILK OF MAGNESIA) 400 MG/5ML suspension If no BM in 3 days, give 30 cc Milk of Magnesium p.o. x 1 dose in 24 hours as needed (Do not use standing constipation orders for residents with renal failure CFR less than 30. Contact MD for orders) (Physician Order)  ? memantine (NAMENDA) 10 MG tablet Take 10 mg by mouth 2 (two) times daily.   ? Multiple Vitamin (MULTI-VITAMIN DAILY PO) Take one tablet by mouth once daily  ? NON FORMULARY DIET: REGULAR, THIN LIQUIDS  ? Nutritional Supplements (ENSURE ENLIVE PO) ENSURE ENLIVE LIQUID GIVE 8 OUNCE BY MOUTH ONCE DAILY FOR SUPPLEMENT  ? Nutritional Supplements (NUTRITIONAL SUPPLEMENT PO) Take 1 each by mouth daily. Magic Cup to help stabilize weight  ? QUEtiapine (SEROQUEL) 25 MG tablet Take 75 mg by mouth 2 (two) times daily. FOR DEPRESSION/AGITATION. MONITOR FOR SEDATION AND HOLD.  ? sennosides-docusate sodium (SENOKOT-S) 8.6-50 MG tablet Take 2 tablets by mouth daily.   ? Skin Protectants, Misc. (MINERIN) CREA Apply topically to ankles twice a day for dry skin.  ? Sodium Phosphates (RA SALINE ENEMA RE) If not relieved by Biscodyl suppository, give disposable Saline Enema rectally X 1 dose/24 hrs as needed (Do not use constipation standing orders for residents with renal failure/CFR less than 30. Contact MD for orders)(Physician Or  ? ?No facility-administered encounter medications on file as of 10/03/2021.  ? ? ?Review of Systems    Unable to obtain due to  dementia ? ? ? ?Immunization History  ?Administered Date(s) Administered  ? Influenza-Unspecified 02/17/2016, 08/01/2018, 03/18/2019, 04/07/2020, 03/29/2021  ? Moderna Sars-Covid-2 Vaccination 07/06/2019, 07/06/2019, 08/03/2019  ? PPD Test 04/08/2016  ? Pneumococcal Conjugate-13 01/11/2017  ? Pneumococcal-Unspecified 02/16/2014  ? Tdap 02/27/2019, 05/09/2021  ? Unspecified SARS-COV-2 Vaccination 06/21/2020  ? ?Pertinent  Health Maintenance Due  ?Topic Date Due  ? FOOT EXAM  09/12/2021  ? HEMOGLOBIN A1C  12/25/2021  ? INFLUENZA VACCINE  01/16/2022  ? OPHTHALMOLOGY EXAM  Discontinued  ? URINE MICROALBUMIN  Discontinued  ? DEXA SCAN  Discontinued  ? ? ?  08/07/2016  ?  3:41 PM 01/10/2017  ?  3:17 PM 01/14/2018  ?  9:38 AM 01/16/2019  ?  4:05 PM 05/09/2021  ? 10:16 AM  ?Fall Risk  ?Falls in the past year? Yes No  No 1   ?Was there an injury with Fall?    0   ?Fall Risk Category Calculator    2   ?Fall Risk Category    Moderate   ?Patient Fall Risk Level     High fall risk  ?Patient at Risk for Falls Due to    History of fall(s);Impaired balance/gait;Impaired mobility   ?Fall risk Follow up    Falls evaluation completed;Falls prevention discussed;Education provided   ? ? ? ?Vitals:  ? 10/03/21 1052  ?BP: 139/76  ?Pulse: 79  ?Resp: 20  ?Temp: 97.6 ?F (36.4 ?C)  ?Weight: 171 lb 8 oz (77.8 kg)  ?Height: 5\' 9"  (1.753 m)  ? ?Body mass index is 25.33 kg/m?. ? ?Physical Exam ?Constitutional:   ?   General: She is not in acute distress. ?   Appearance: Normal appearance.  ?HENT:  ?   Head: Normocephalic and atraumatic.  ?   Nose: Nose normal.  ?   Mouth/Throat:  ?   Mouth: Mucous membranes are moist.  ?Eyes:  ?   Conjunctiva/sclera: Conjunctivae normal.  ?Cardiovascular:  ?   Rate and Rhythm: Normal rate and regular rhythm.  ?Pulmonary:  ?   Effort: Pulmonary effort is normal.  ?   Breath sounds: Normal breath sounds.  ?Abdominal:  ?   General: Bowel sounds are normal.  ?   Palpations: Abdomen is soft.  ?Musculoskeletal:     ?    General: Normal range of motion.  ?   Cervical back: Normal range of motion.  ?Skin: ?   General: Skin is warm and dry.  ?Neurological:  ?   Mental Status: Mental status is at baseline. She is disoriented.  ?Psychiatric:

## 2021-10-18 ENCOUNTER — Other Ambulatory Visit: Payer: Self-pay | Admitting: Adult Health

## 2021-10-18 MED ORDER — LORAZEPAM 2 MG/ML PO CONC: ORAL | 0 refills | Status: DC

## 2021-10-20 ENCOUNTER — Non-Acute Institutional Stay (SKILLED_NURSING_FACILITY): Payer: Medicare Other | Admitting: Adult Health

## 2021-10-20 ENCOUNTER — Encounter: Payer: Self-pay | Admitting: Adult Health

## 2021-10-20 DIAGNOSIS — E034 Atrophy of thyroid (acquired): Secondary | ICD-10-CM | POA: Diagnosis not present

## 2021-10-20 DIAGNOSIS — R040 Epistaxis: Secondary | ICD-10-CM

## 2021-10-20 NOTE — Progress Notes (Signed)
? ?Location:  [facility] ?Nursing Home Room Number: [id number] ?Place of Service:  SNF (31) ?Provider:  Durenda Age, DNP, FNP-BC ? ?Patient Care Team: ?Hendricks Limes, MD as PCP - General (Internal Medicine) ? ?Extended Emergency Contact Information ?Primary Emergency Contact: Robinson,Roberto ?Address: [address] ?         [place], [place] [address] [place] ?Home Phone: [phone] ?Mobile Phone: [phone] ?Relation: Son ?Secondary Emergency Contact: Reid,Cynthia ? [place] ?Mobile Phone: [phone] ?Relation: Daughter ? ?Code Status:  FULL ? ?Goals of care: Advanced Directive information ? ?  10/24/2021  ? 10:33 AM  ?Advanced Directives  ?Does Patient Have a Medical Advance Directive? Yes  ?Type of Advance Directive Healthcare Power of Attorney  ?Does patient want to make changes to medical advance directive? No - Patient declined  ?Copy of [place] in Chart? Yes - validated most recent copy scanned in chart (See row information)  ? ? ? ?Chief Complaint  ?Patient presents with  ? Acute Visit  ?  Low TSH  ? ? ?HPI:  ?Pt is a 87 y.o. female seen today for an acute visit regarding low tsh 0.12. She currently takes Levothyroxine 175 mcg daily for hypothyroidism. She was reported to have had epistaxis episode last night. She is currently ASA 81 mg daily. She is a long-term care resident of [facility].  ? ? ?Past Medical History:  ?Diagnosis Date  ? Chronic kidney disease   ? Stage III  ? Dementia with behavioral disturbance ([place]) 03/19/2015  ? Notated on FL2 Form from Dr. Agapito Games [phone]  ? Depression   ? previously saw Dr Casimiro Needle  ? Diabetes mellitus without complication ([place])   ? [id number] A1c 6%  ? Dyslipidemia   ? 11/18/15 Tg 74, HDL 60, LDL 90 on statin  ? Hypertension   ? Hypothyroidism   ? 11/18/15 TSH 53.6, 02/07/16 TSH 9.42 @ [facility]  ? MDD (major depressive disorder)   ? With behavioral disturbance  ?  Scabies   ? 02/21/16 [facility],[place]; Rx: Permethrin cream  ? Urinary tract infection due to [place]us 05/04/2019  ? Uniformly sensitive except to nitrofurantoin; Cipro prescribed  ? Vitamin D deficiency   ? 11/18/15 vitamin D 25-hydroxy 39  ? ?Past Surgical History:  ?Procedure Laterality Date  ? no record    ? 04/10/15 [facility] note: see old chart  ? ? ?No Known Allergies ? ?Outpatient Encounter Medications as of 10/20/2021  ?Medication Sig  ? acetaminophen (TYLENOL) 325 MG tablet Take 650 mg by mouth every 6 (six) hours as needed.  ? aspirin EC 81 MG tablet Take 81 mg by mouth daily.   ? atorvastatin (LIPITOR) 40 MG tablet Take 40 mg by mouth at bedtime.   ? bisacodyl (DULCOLAX) 10 MG suppository If not relieved by MOM, give 10 mg Bisacodyl suppositiory rectally X 1 dose in 24 hours as needed (Do not use constipation standing orders for residents with renal failure/CFR less than 30. Contact MD for orders) (Physician Order)  ? Calcium Carb-Cholecalciferol (CALCIUM-VITAMIN D3) 600-400 MG-UNIT TABS Take 1 tablet by mouth 2 (two) times daily.  ? Carboxymethylcellulose Sodium (ARTIFICIAL TEARS OP) Apply to eye. Instill 2 gtts to both eyes twice daily Dx: dry eyes  ? cholecalciferol (VITAMIN D3) 25 MCG (1000 UNIT) tablet Take 2,000 Units by mouth daily. Take 2 tablet (2000 unit total) by mouth one time daily for vitamin deficiency  ? divalproex (DEPAKOTE) 125 MG  DR tablet Take 125 mg by mouth 4 (four) times daily.  ? donepezil (ARICEPT) 10 MG tablet Take 10 mg by mouth at bedtime.   ? escitalopram (LEXAPRO) 5 MG/5ML solution Take 10 mg by mouth daily.  ? feeding supplement (BOOST HIGH PROTEIN) LIQD Take 1 Container by mouth daily. IF ENSURE IS UNAVAILABLE  ? furosemide (LASIX) 20 MG tablet Take 20 mg by mouth every Monday, Wednesday, and Friday.   ? levothyroxine (SYNTHROID) 175 MCG tablet Take 175 mcg by mouth daily before breakfast.  ? LORazepam (ATIVAN) 2 MG/ML concentrated solution Give 0.5 mg/ml gel apply  topically to skin three times a day  ? magnesium hydroxide (MILK OF MAGNESIA) 400 MG/5ML suspension If no BM in 3 days, give 30 cc Milk of Magnesium p.o. x 1 dose in 24 hours as needed (Do not use standing constipation orders for residents with renal failure CFR less than 30. Contact MD for orders) (Physician Order)  ? memantine (NAMENDA) 10 MG tablet Take 10 mg by mouth 2 (two) times daily.   ? Multiple Vitamin (MULTI-VITAMIN DAILY PO) Take one tablet by mouth once daily  ? NON FORMULARY DIET: REGULAR, THIN LIQUIDS  ? Nutritional Supplements (ENSURE ENLIVE PO) ENSURE ENLIVE LIQUID GIVE 8 OUNCE BY MOUTH ONCE DAILY FOR SUPPLEMENT  ? Nutritional Supplements (NUTRITIONAL SUPPLEMENT PO) Take 1 each by mouth daily. Magic Cup to help stabilize weight  ? QUEtiapine (SEROQUEL) 25 MG tablet Take 75 mg by mouth 2 (two) times daily. FOR DEPRESSION/AGITATION. MONITOR FOR SEDATION AND HOLD.  ? sennosides-docusate sodium (SENOKOT-S) 8.6-50 MG tablet Take 2 tablets by mouth daily.   ? Skin Protectants, Misc. (MINERIN) CREA Apply topically to ankles twice a day for dry skin.  ? Sodium Phosphates (RA SALINE ENEMA RE) If not relieved by Biscodyl suppository, give disposable Saline Enema rectally X 1 dose/24 hrs as needed (Do not use constipation standing orders for residents with renal failure/CFR less than 30. Contact MD for orders)(Physician Or  ? ?No facility-administered encounter medications on file as of 10/20/2021.  ? ? ?Review of Systems  Unable to obtain due to dementia. ? ? ? ?Immunization History  ?Administered Date(s) Administered  ? Influenza-Unspecified 02/17/2016, 08/01/2018, 03/18/2019, 04/07/2020, 03/29/2021  ? Moderna Sars-Covid-2 Vaccination 07/06/2019, 07/06/2019, 08/03/2019  ? PPD Test 04/08/2016  ? Pneumococcal Conjugate-13 01/11/2017  ? Pneumococcal-Unspecified 02/16/2014  ? Tdap 02/27/2019, 05/09/2021  ? Unspecified SARS-COV-2 Vaccination 06/21/2020  ? ?Pertinent  Health Maintenance Due  ?Topic Date Due  ? FOOT  EXAM  09/12/2021  ? HEMOGLOBIN A1C  12/25/2021  ? INFLUENZA VACCINE  01/16/2022  ? OPHTHALMOLOGY EXAM  Discontinued  ? URINE MICROALBUMIN  Discontinued  ? DEXA SCAN  Discontinued  ? ? ?  08/07/2016  ?  3:41 PM 01/10/2017  ?  3:17 PM 01/14/2018  ?  9:38 AM 01/16/2019  ?  4:05 PM 05/09/2021  ? 10:16 AM  ?Fall Risk  ?Falls in the past year? Yes No No 1   ?Was there an injury with Fall?    0   ?Fall Risk Category Calculator    2   ?Fall Risk Category    Moderate   ?Patient Fall Risk Level     High fall risk  ?Patient at Risk for Falls Due to    History of fall(s);Impaired balance/gait;Impaired mobility   ?Fall risk Follow up    Falls evaluation completed;Falls prevention discussed;Education provided   ? ? ? ?Vitals:  ? 10/20/21 1418  ?BP: 123/72  ?Pulse: 78  ?  Resp: 19  ?Temp: 97.6 ?F (36.4 ?C)  ?Weight: 166 lb 12.8 oz (75.7 kg)  ?Height: 5\' 9"  (1.753 m)  ? ?Body mass index is 24.63 kg/m?. ? ?Physical Exam ?Constitutional:   ?   General: She is not in acute distress. ?HENT:  ?   Head: Normocephalic and atraumatic.  ?   Nose: Nose normal.  ?   Mouth/Throat:  ?   Mouth: Mucous membranes are moist.  ?Eyes:  ?   Conjunctiva/sclera: Conjunctivae normal.  ?Cardiovascular:  ?   Rate and Rhythm: Normal rate and regular rhythm.  ?Pulmonary:  ?   Effort: Pulmonary effort is normal.  ?   Breath sounds: Normal breath sounds.  ?Abdominal:  ?   General: Bowel sounds are normal.  ?   Palpations: Abdomen is soft.  ?Musculoskeletal:     ?   General: Normal range of motion.  ?   Cervical back: Normal range of motion.  ?Skin: ?   General: Skin is warm and dry.  ?Neurological:  ?   General: No focal deficit present.  ?   Mental Status: She is alert. She is disoriented.  ?Psychiatric:     ?   Mood and Affect: Mood normal.     ?   Behavior: Behavior normal.  ?  ? ? ? ?Labs reviewed: ?Recent Labs  ?  05/05/21 ?0000 05/09/21 ?1115 07/13/21 ?0000  ?NA 143  143 139 142  ?K 4.0  4.0 4.0 4.0  ?CL 107  107 105 106  ?CO2 26*  26* 26 27*  ?GLUCOSE  --   100*  --   ?BUN 13  13 15 21   ?CREATININE 0.9  0.9 1.06* 0.8  ?CALCIUM 9.5  9.5 9.0 9.1  ?MG  --  1.9  --   ? ?Recent Labs  ?  05/05/21 ?0000 05/09/21 ?1115 07/13/21 ?0000  ?AST 25  25 32 21  ?ALT 16  16 19

## 2021-10-24 ENCOUNTER — Non-Acute Institutional Stay (SKILLED_NURSING_FACILITY): Payer: Medicare Other | Admitting: Internal Medicine

## 2021-10-24 ENCOUNTER — Encounter: Payer: Self-pay | Admitting: Internal Medicine

## 2021-10-24 DIAGNOSIS — D649 Anemia, unspecified: Secondary | ICD-10-CM | POA: Diagnosis not present

## 2021-10-24 DIAGNOSIS — D696 Thrombocytopenia, unspecified: Secondary | ICD-10-CM | POA: Diagnosis not present

## 2021-10-24 DIAGNOSIS — R040 Epistaxis: Secondary | ICD-10-CM | POA: Diagnosis not present

## 2021-10-24 NOTE — Assessment & Plan Note (Signed)
On 09/16/2021 H/H was 13/40.  On 5/8 value was were 11.4/34.3 with normochromic, normocytic indices. ?Monitor for bleeding dyscrasias at the SNF. ?

## 2021-10-24 NOTE — Assessment & Plan Note (Addendum)
Its unlikely the epistaxis is related to seasonal rhinoconjunctivitis as the nasal mucosa is dry rather than edematous & eos # normal.  Extrinsic symptoms have not been reported by staff.  ?Normally Vaseline topically twice a day to the nasal septum would be employed; but her mood disorder with aggressive interaction precludes this.  At this time thrombocytopenia has resolved and epistaxis has not recurred so monitor will continue. ?

## 2021-10-24 NOTE — Progress Notes (Signed)
? ?  NURSING HOME LOCATION:  [facility] ?ROOM NUMBER:  117 A ? ?CODE STATUS:  FULL ? ?PCP:  Kenard Gower NP,PSC ? ?This is a nursing facility follow up visit for specific acute issue of epistaxis. ? ?Interim medical record and care since last SNF visit was updated with review of diagnostic studies and change in clinical status since last visit were documented. ? ?HPI: Staff reported epistaxis while eating evening meal 10/23/2021.  This did stop with direct pressure.  Her only anticoagulant is low-dose aspirin. ?Family members verify that she has had epistaxis in the past, most recently approximately a year ago.  They could not tell me if this were related to seasonal allergy symptoms. ?Chart review indicates that she has had variable thrombocytopenia over the last year.  Platelet count was as low as 86,000 on 12/26/2020.  In January of this year was 118,000 and on April 1  124,000.  Platelet count was 145,000 on 5/8. ?H/H was 11.4/34.3 on 5/8; values were 13/40 on 4/1.  Staff has not reported any other bleeding dyscrasias. ? ?Review of systems: Patient remained nonverbal during the interview and exam.  Family member state that she is tired and therefore not exhibiting her usual aggressive interaction. ? ?Physical exam:  ?Pertinent or positive findings: She exhibited the mastication maneuvers of the mandible and athetoid movements of the tongue which she has in the past.  She looked blankly at the examiner before essentially going to sleep.  Unlike prior exams she did not attempt to grab or hit at the examiner.  Dried blood was noted in the right nare with appearance of eschar on the right nasal septum.  Heart sounds are distant and rate is slow.  Breath sounds are decreased.  She had no lymphadenopathy or organomegaly.  No significant bruising was present. ? ?General appearance: Adequately nourished; no acute distress, increased work of breathing is present.   ?Lymphatic: No  lymphadenopathy about the head, neck, axilla. ?Eyes: No conjunctival inflammation or lid edema is present. There is no scleral icterus. ?Ears:  External ear exam shows no significant lesions or deformities.   ?Nose:  External nasal examination shows no deformity or inflammation. ?Oral exam:  Lips and gums are healthy appearing.  ?Neck:  No thyromegaly, masses, tenderness noted.    ?Heart:  No gallop, murmur, click, rub .  ?Lungs:  without wheezes, rhonchi, rales, rubs. ?Abdomen: Bowel sounds are normal. Abdomen is soft and nontender with no organomegaly, hernias, masses. ?GU: Deferred  ?Extremities:  No cyanosis, clubbing, edema  ?Skin: Warm & dry w/o tenting. ?No significant lesions or rash. ? ?See summary under each active problem in the Problem List with associated updated therapeutic plan ? ? ?

## 2021-10-24 NOTE — Patient Instructions (Signed)
See assessment and plan under each diagnosis in the problem list and acutely for this visit 

## 2021-10-24 NOTE — Assessment & Plan Note (Signed)
10/23/2021 platelet count normal @ 145,000.  On 07/13/2021 platelet count was 118,000.  [place] platelet count was 86,000 on 12/26/2020.   ?Monitor for any bleeding dyscrasias. ?

## 2021-10-26 ENCOUNTER — Non-Acute Institutional Stay (SKILLED_NURSING_FACILITY): Payer: Medicare Other | Admitting: Internal Medicine

## 2021-10-26 ENCOUNTER — Encounter: Payer: Self-pay | Admitting: Internal Medicine

## 2021-10-26 DIAGNOSIS — D72819 Decreased white blood cell count, unspecified: Secondary | ICD-10-CM | POA: Diagnosis not present

## 2021-10-26 DIAGNOSIS — F03918 Unspecified dementia, unspecified severity, with other behavioral disturbance: Secondary | ICD-10-CM | POA: Diagnosis not present

## 2021-10-26 DIAGNOSIS — D696 Thrombocytopenia, unspecified: Secondary | ICD-10-CM

## 2021-10-26 DIAGNOSIS — R040 Epistaxis: Secondary | ICD-10-CM

## 2021-10-26 NOTE — Assessment & Plan Note (Signed)
10/24/2021 Platelet # 145,000 ?

## 2021-10-26 NOTE — Progress Notes (Signed)
? ?  NURSING HOME LOCATION:  [facility] ?ROOM NUMBER: 117 A ? ?CODE STATUS: Not on file ? ?PCP: Kenard Gower NP ? ?This is a nursing facility follow up visit for specific acute issue of recurrent epistaxis. ? ?Interim medical record and care since last SNF visit was updated with review of diagnostic studies and change in clinical status since last visit were documented. ? ?HPI: She was seen for this problem 5/9; the bleeding from the right nare has been self-limiting, controlled by application of external pressure.  Staff now reports that she has had repeated epistaxis each day.  Today she has had 3 episodes with clots emanating from the left nare. ?The bleeding stops temporarily after the application of pressure and cool compresses. ?Staff reports that the air conditioning is out and there is no humidification on the hall and temperatures can be as high as 84 degrees. ?She is only on baby aspirin as an anticoagulant. ?CBC was performed 5/9 and revealed H/H of 11.4/34.4 with normochromic, normocytic indices.  Platelet count was 145,000.  White count was 2900 with 30.8% neutrophils (37-80) and 53.5% lymphocytes (10-50).  Her white blood count range has been this low of 2.9 up to a high of 5500.  On 09/16/2021 H/H was 13/40. ? ?Review of systems could not be completed as she was nonverbal today.  Staff is not reporting any extrinsic signs or symptoms. ? ?Physical exam:  ?Pertinent or positive findings: She sits staring blankly ignoring the examiner.  She has repetitive smacking of her lips.  Dried blood is noted in both nares.  There is no lymphadenopathy about the neck or axilla.  No bruising is observed. ? ?See summary under each active problem in the Problem List with associated updated therapeutic plan ? ? ?

## 2021-10-26 NOTE — Assessment & Plan Note (Deleted)
10/24/2021 white blood count 2900 with 30.8% neutrophils and 53.5% lymphocytes.  H/H 11.4/34.4.  Platelet count now normal at 145,000. ?

## 2021-10-26 NOTE — Assessment & Plan Note (Signed)
Recurrent epistaxis since 5/8 with 3 episodes today with large clots emanating from the left nare.  The bleeding does stop with pressure and application of cool compresses.  This is in the context of the air conditioning being out with decrease humidity and temps up to 84 degrees on that hallway. ?Mild anemia present and platelet count normal.  She is only on low-dose aspirin.  We will continue to try to control the situation with pressure applied to the venous plexus for up to 5 minutes.  Unfortunately if this recurs in cannot be controlled; she will have to be sent to the ED.  She may require sedating for packing because of her dementia and behavioral issues. ? ?

## 2021-10-26 NOTE — Assessment & Plan Note (Signed)
Today she is nonverbal, sitting quietly staring ahead.  Typically she will act out hitting at those around her or trying to grab them.  If she has to go to the ER for uncontrolled epistaxis despite interventions by staff; she may require sedation to pack the nares. ?

## 2021-10-26 NOTE — Patient Instructions (Signed)
See assessment and plan under each diagnosis in the problem list and acutely for this visit 

## 2021-10-26 NOTE — Assessment & Plan Note (Deleted)
10/24/2021 Platelet # 145,000 ?

## 2021-10-26 NOTE — Assessment & Plan Note (Signed)
10/24/2021 white blood count 2900 with 30.8% neutrophils and 53.5% lymphocytes.  H/H 11.4/34.4.  Platelet count now normal at 145,000. ?

## 2021-11-07 ENCOUNTER — Encounter: Payer: Self-pay | Admitting: Adult Health

## 2021-11-07 ENCOUNTER — Non-Acute Institutional Stay (SKILLED_NURSING_FACILITY): Payer: Medicare Other | Admitting: Adult Health

## 2021-11-07 DIAGNOSIS — E034 Atrophy of thyroid (acquired): Secondary | ICD-10-CM | POA: Diagnosis not present

## 2021-11-07 DIAGNOSIS — E782 Mixed hyperlipidemia: Secondary | ICD-10-CM

## 2021-11-07 DIAGNOSIS — F03918 Unspecified dementia, unspecified severity, with other behavioral disturbance: Secondary | ICD-10-CM | POA: Diagnosis not present

## 2021-11-07 DIAGNOSIS — F39 Unspecified mood [affective] disorder: Secondary | ICD-10-CM | POA: Diagnosis not present

## 2021-11-07 NOTE — Progress Notes (Signed)
Location:  [place] Room Number: [place] of Service:  SNF (31) Provider:  Durenda Age, DNP, FNP-BC  Patient Care Team: Hendricks Limes, MD as PCP - General (Internal Medicine)  Extended Emergency Contact Information Primary Emergency Contact: Robinson,Roberto Address: [address]          [place], [place] [address] [name] of [place] Phone: [phone] Mobile Phone: [phone] Relation: Son Secondary Emergency Contact: Reid,Cynthia  [place] Mobile Phone: [phone] Relation: Daughter  Code Status:  FULL CODE  Goals of care: Advanced Directive information    11/07/2021    4:34 PM  Advanced Directives  Does Patient Have a Medical Advance Directive? Yes  Does patient want to make changes to medical advance directive? No - Patient declined  Copy of [place] in Chart? Yes - validated most recent copy scanned in chart (See row information)     Chief Complaint  Patient presents with   Medical Management of Chronic Issues    Routine Visit    HPI:  Pt is a 87 y.o. female seen today for medical management of chronic diseases. She is a long-term care resident of [facility].  Mixed hyperlipidemia -  takes Lipitor 40 mg 1 tab at bedtime  Hypothyroidism due to acquired atrophy of thyroid -recently decreased levothyroxine to 137 mcg daily due to TSH 0.12, low  Mood disorder with psychosis (HCC) -  takes divalproex DR 125 mg 1 capsule 4 times a day, quetiapine 75 mg twice a day, Ativan 0 0.5 mg 4 times a day and escitalopram 10 mg daily  Dementia with behavioral disturbance (HCC)  -  BIMS score 3/15, takes donepezil 10 mg 1 tab at bedtime and memantine 10 mg 1 tab twice a day   Past Medical History:  Diagnosis Date   Chronic kidney disease    Stage III   Dementia with behavioral disturbance ([place]) 03/19/2015   Notated on FL2 Form from Dr. Agapito Games  [id number]   Depression    previously saw Dr Casimiro Needle   Diabetes mellitus without complication ([place])    99991111 A1c 6%   Dyslipidemia    11/18/15 Tg 74, HDL 60, LDL 90 on statin   Hypertension    Hypothyroidism    11/18/15 TSH 53.6, 02/07/16 TSH 9.42 @ [facility]   MDD (major depressive disorder)    With behavioral disturbance   Scabies    02/21/16 [facility],[place]; Rx: Permethrin cream   Urinary tract infection due to Proteus 05/04/2019   Uniformly sensitive except to nitrofurantoin; Cipro prescribed   Vitamin D deficiency    11/18/15 vitamin D 25-hydroxy 39   Past Surgical History:  Procedure Laterality Date   no record     04/10/15 [facility] note: see old chart    No Known Allergies  Outpatient Encounter Medications as of 11/07/2021  Medication Sig   acetaminophen (TYLENOL) 325 MG tablet Take 650 mg by mouth every 6 (six) hours as needed.   aspirin EC 81 MG tablet Take 81 mg by mouth daily.    atorvastatin (LIPITOR) 40 MG tablet Take 40 mg by mouth at bedtime.    bisacodyl (DULCOLAX) 10 MG suppository If not relieved by MOM, give 10 mg Bisacodyl suppositiory rectally X 1 dose in 24 hours as needed (Do not use constipation standing orders for residents with renal failure/CFR less than 30. Contact MD for orders) (Physician Order)   Calcium Carb-Cholecalciferol (CALCIUM-VITAMIN D3) 600-400  MG-UNIT TABS Take 1 tablet by mouth 2 (two) times daily.   carbamide peroxide (DEBROX) 6.5 % OTIC solution 2 (two) times daily. 2-3 drops to right ear 3 times a week until next audiology appointment   Carboxymethylcellulose Sodium (ARTIFICIAL TEARS OP) Apply to eye. Instill 2 gtts to both eyes twice daily Dx: dry eyes   cholecalciferol (VITAMIN D3) 25 MCG (1000 UNIT) tablet Take 2,000 Units by mouth daily. Take 2 tablet (2000 unit total) by mouth one time daily for vitamin deficiency   divalproex (DEPAKOTE) 125 MG DR tablet Take 125 mg by mouth 4 (four) times daily.   donepezil (ARICEPT)  10 MG tablet Take 10 mg by mouth at bedtime.    escitalopram (LEXAPRO) 5 MG/5ML solution Take 10 mg by mouth daily.   feeding supplement (BOOST HIGH PROTEIN) LIQD Take 1 Container by mouth daily. IF ENSURE IS UNAVAILABLE   furosemide (LASIX) 20 MG tablet Take 20 mg by mouth every Monday, Wednesday, and Friday.    levothyroxine (SYNTHROID) 137 MCG tablet Take 175 mcg by mouth daily before breakfast.   LORazepam (ATIVAN) 2 MG/ML concentrated solution Give 0.5 mg/ml gel apply topically to skin three times a day   magnesium hydroxide (MILK OF MAGNESIA) 400 MG/5ML suspension If no BM in 3 days, give 30 cc Milk of Magnesium p.o. x 1 dose in 24 hours as needed (Do not use standing constipation orders for residents with renal failure CFR less than 30. Contact MD for orders) (Physician Order)   memantine (NAMENDA) 10 MG tablet Take 10 mg by mouth 2 (two) times daily.    Multiple Vitamin (MULTI-VITAMIN DAILY PO) Take one tablet by mouth once daily   NON FORMULARY DIET: REGULAR, THIN LIQUIDS   Nutritional Supplements (ENSURE ENLIVE PO) ENSURE ENLIVE LIQUID GIVE 8 OUNCE BY MOUTH ONCE DAILY FOR SUPPLEMENT   Nutritional Supplements (NUTRITIONAL SUPPLEMENT PO) Take 1 each by mouth daily. Magic Cup to help stabilize weight   QUEtiapine (SEROQUEL) 25 MG tablet Take 75 mg by mouth 2 (two) times daily. FOR DEPRESSION/AGITATION. MONITOR FOR SEDATION AND HOLD.   sennosides-docusate sodium (SENOKOT-S) 8.6-50 MG tablet Take 2 tablets by mouth daily.    Skin Protectants, Misc. (MINERIN) CREA Apply topically to ankles twice a day for dry skin.   Sodium Phosphates (RA SALINE ENEMA RE) If not relieved by Biscodyl suppository, give disposable Saline Enema rectally X 1 dose/24 hrs as needed (Do not use constipation standing orders for residents with renal failure/CFR less than 30. Contact MD for orders)(Physician Or   No facility-administered encounter medications on file as of 11/07/2021.    Review of Systems  Unable to  obtain due to dementia.    Immunization History  Administered Date(s) Administered   Influenza-Unspecified 02/17/2016, 08/01/2018, 03/18/2019, 04/07/2020, 03/29/2021   Moderna Sars-Covid-2 Vaccination 07/06/2019, 07/06/2019, 08/03/2019   PPD Test 04/08/2016   Pneumococcal Conjugate-13 01/11/2017   Pneumococcal-Unspecified 02/16/2014   Tdap 02/27/2019, 05/09/2021   Unspecified SARS-COV-2 Vaccination 06/21/2020   Pertinent  Health Maintenance Due  Topic Date Due   FOOT EXAM  09/12/2021   HEMOGLOBIN A1C  12/25/2021   INFLUENZA VACCINE  01/16/2022   OPHTHALMOLOGY EXAM  Discontinued   URINE MICROALBUMIN  Discontinued   DEXA SCAN  Discontinued      08/07/2016    3:41 PM 01/10/2017    3:17 PM 01/14/2018    9:38 AM 01/16/2019    4:05 PM 05/09/2021   10:16 AM  Fall Risk  Falls in the past year? Yes No  No 1   Was there an injury with Fall?    0   Fall Risk Category Calculator    2   Fall Risk Category    Moderate   Patient Fall Risk Level     High fall risk  Patient at Risk for Falls Due to    History of fall(s);Impaired balance/gait;Impaired mobility   Fall risk Follow up    Falls evaluation completed;Falls prevention discussed;Education provided      Vitals:   11/07/21 1644  BP: 103/71  Pulse: 69  Resp: 20  Temp: 97.7 F (36.5 C)  Height: 5\' 9"  (1.753 m)   Body mass index is 24.63 kg/m.  Physical Exam Constitutional:      General: She is not in acute distress.    Appearance: Normal appearance.  HENT:     Head: Normocephalic and atraumatic.     Nose: Nose normal.     Mouth/Throat:     Mouth: Mucous membranes are moist.  Eyes:     Conjunctiva/sclera: Conjunctivae normal.  Cardiovascular:     Rate and Rhythm: Normal rate and regular rhythm.  Pulmonary:     Effort: Pulmonary effort is normal.     Breath sounds: Normal breath sounds.  Abdominal:     General: Bowel sounds are normal.     Palpations: Abdomen is soft.  Musculoskeletal:        General: Normal range  of motion.     Cervical back: Normal range of motion.  Skin:    General: Skin is warm and dry.  Neurological:     General: No focal deficit present.     Mental Status: She is oriented to person, place, and time.  Psychiatric:        Mood and Affect: Mood normal.        Behavior: Behavior normal.       Labs reviewed: Recent Labs    05/05/21 0000 05/09/21 1115 07/13/21 0000  NA 143  143 139 142  K 4.0  4.0 4.0 4.0  CL 107  107 105 106  CO2 26*  26* 26 27*  GLUCOSE  --  100*  --   BUN 13  13 15 21   CREATININE 0.9  0.9 1.06* 0.8  CALCIUM 9.5  9.5 9.0 9.1  MG  --  1.9  --    Recent Labs    05/05/21 0000 05/09/21 1115 07/13/21 0000  AST 25  25 32 21  ALT 16  16 19 11   ALKPHOS 111  111 80 87  BILITOT  --  0.3  --   PROT  --  6.6  --   ALBUMIN 3.6 3.5 3.8   Recent Labs    12/26/20 0000 01/27/21 0000 05/09/21 1115 07/13/21 0000 09/16/21 0000  WBC 3.2   < > 4.7 3.6 3.7  NEUTROABS 0.90  --   --  0.80  --   HGB 12.1   < > 13.0 13.1 13.0  HCT 91*   < > 39.4 39 40  MCV  --   --  94.3  --   --   PLT 86*   < > 205 118* 124*   < > = values in this interval not displayed.   Lab Results  Component Value Date   TSH 0.72 07/13/2021   Lab Results  Component Value Date   HGBA1C 5.9 06/27/2021   Lab Results  Component Value Date   CHOL 182 06/27/2021   HDL 62 06/27/2021  LDLCALC 89 06/27/2021   TRIG 156 06/27/2021    Significant Diagnostic Results in last 30 days:  No results found.  Assessment/Plan  1. Mixed hyperlipidemia Lab Results  Component Value Date   CHOL 182 06/27/2021   HDL 62 06/27/2021   LDLCALC 89 06/27/2021   TRIG 156 06/27/2021   -    Continue Lipitor  2. Hypothyroidism due to acquired atrophy of thyroid -   Continue levothyroxine  3. Mood disorder with psychosis ([place]) -   mood is stable, continue divalproex DR, quetiapine, escitalopram and Ativan gel -    Followed by psych NP  4. Dementia with behavioral disturbance  ([place]) -    Has severe cognitive impairment, continue donepezil and memantine    Family/ staff Communication: Discussed plan of care with  charge nurse.  Labs/tests ordered:   None    Durenda Age, DNP, MSN, FNP-BC [facility] [phone] (Monday-Friday 8:00 a.m. - 5:00 p.m.) [phone] (after hours)

## 2022-05-03 ENCOUNTER — Emergency Department (HOSPITAL_COMMUNITY): Payer: Medicare Other

## 2022-05-03 ENCOUNTER — Inpatient Hospital Stay (HOSPITAL_COMMUNITY)
Admission: EM | Admit: 2022-05-03 | Discharge: 2022-05-09 | DRG: 871 | Disposition: A | Discharge status: Skilled Nursing Facility | Payer: Medicare Other | Source: Skilled Nursing Facility | Attending: Internal Medicine | Admitting: Internal Medicine

## 2022-05-03 DIAGNOSIS — N183 Chronic kidney disease, stage 3 unspecified: Secondary | ICD-10-CM | POA: Diagnosis present

## 2022-05-03 DIAGNOSIS — E1122 Type 2 diabetes mellitus with diabetic chronic kidney disease: Secondary | ICD-10-CM | POA: Diagnosis present

## 2022-05-03 DIAGNOSIS — Z7982 Long term (current) use of aspirin: Secondary | ICD-10-CM

## 2022-05-03 DIAGNOSIS — A4189 Other specified sepsis: Principal | ICD-10-CM | POA: Diagnosis present

## 2022-05-03 DIAGNOSIS — F0393 Unspecified dementia, unspecified severity, with mood disturbance: Secondary | ICD-10-CM | POA: Diagnosis present

## 2022-05-03 DIAGNOSIS — E785 Hyperlipidemia, unspecified: Secondary | ICD-10-CM | POA: Diagnosis present

## 2022-05-03 DIAGNOSIS — J9601 Acute respiratory failure with hypoxia: Secondary | ICD-10-CM | POA: Diagnosis not present

## 2022-05-03 DIAGNOSIS — J1282 Pneumonia due to coronavirus disease 2019: Secondary | ICD-10-CM | POA: Diagnosis present

## 2022-05-03 DIAGNOSIS — E1322 Other specified diabetes mellitus with diabetic chronic kidney disease: Secondary | ICD-10-CM | POA: Diagnosis present

## 2022-05-03 DIAGNOSIS — Z79890 Hormone replacement therapy (postmenopausal): Secondary | ICD-10-CM

## 2022-05-03 DIAGNOSIS — N179 Acute kidney failure, unspecified: Secondary | ICD-10-CM | POA: Diagnosis present

## 2022-05-03 DIAGNOSIS — Z7401 Bed confinement status: Secondary | ICD-10-CM

## 2022-05-03 DIAGNOSIS — Z87891 Personal history of nicotine dependence: Secondary | ICD-10-CM

## 2022-05-03 DIAGNOSIS — F03918 Unspecified dementia, unspecified severity, with other behavioral disturbance: Secondary | ICD-10-CM | POA: Diagnosis present

## 2022-05-03 DIAGNOSIS — U071 COVID-19: Secondary | ICD-10-CM | POA: Diagnosis present

## 2022-05-03 DIAGNOSIS — I10 Essential (primary) hypertension: Secondary | ICD-10-CM | POA: Diagnosis present

## 2022-05-03 DIAGNOSIS — J189 Pneumonia, unspecified organism: Secondary | ICD-10-CM

## 2022-05-03 DIAGNOSIS — I129 Hypertensive chronic kidney disease with stage 1 through stage 4 chronic kidney disease, or unspecified chronic kidney disease: Secondary | ICD-10-CM | POA: Diagnosis present

## 2022-05-03 DIAGNOSIS — Z79899 Other long term (current) drug therapy: Secondary | ICD-10-CM

## 2022-05-03 DIAGNOSIS — E872 Acidosis, unspecified: Secondary | ICD-10-CM | POA: Diagnosis present

## 2022-05-03 DIAGNOSIS — N1831 Chronic kidney disease, stage 3a: Secondary | ICD-10-CM | POA: Diagnosis present

## 2022-05-03 DIAGNOSIS — E039 Hypothyroidism, unspecified: Secondary | ICD-10-CM | POA: Diagnosis present

## 2022-05-03 DIAGNOSIS — R6520 Severe sepsis without septic shock: Secondary | ICD-10-CM | POA: Diagnosis present

## 2022-05-03 LAB — COMPREHENSIVE METABOLIC PANEL
ALT: 20 U/L (ref 0–44)
AST: 44 U/L — ABNORMAL HIGH (ref 15–41)
Albumin: 3.1 g/dL — ABNORMAL LOW (ref 3.5–5.0)
Alkaline Phosphatase: 68 U/L (ref 38–126)
Anion gap: 10 (ref 5–15)
BUN: 22 mg/dL (ref 8–23)
CO2: 23 mmol/L (ref 22–32)
Calcium: 9 mg/dL (ref 8.9–10.3)
Chloride: 110 mmol/L (ref 98–111)
Creatinine, Ser: 1.19 mg/dL — ABNORMAL HIGH (ref 0.44–1.00)
GFR, Estimated: 45 mL/min — ABNORMAL LOW (ref >60)
Glucose, Bld: 135 mg/dL — ABNORMAL HIGH (ref 70–99)
Potassium: 3.9 mmol/L (ref 3.5–5.1)
Sodium: 143 mmol/L (ref 135–145)
Total Bilirubin: 0.4 mg/dL (ref 0.3–1.2)
Total Protein: 6.6 g/dL (ref 6.5–8.1)

## 2022-05-03 LAB — CBC WITH DIFFERENTIAL/PLATELET
Abs Immature Granulocytes: 0.04 10*3/uL (ref 0.00–0.07)
Basophils Absolute: 0 10*3/uL (ref 0.0–0.1)
Basophils Relative: 0 %
Eosinophils Absolute: 0 10*3/uL (ref 0.0–0.5)
Eosinophils Relative: 0 %
HCT: 37.5 % (ref 36.0–46.0)
Hemoglobin: 12.7 g/dL (ref 12.0–15.0)
Immature Granulocytes: 0 %
Lymphocytes Relative: 15 %
Lymphs Abs: 1.7 10*3/uL (ref 0.7–4.0)
MCH: 31.4 pg (ref 26.0–34.0)
MCHC: 33.9 g/dL (ref 30.0–36.0)
MCV: 92.8 fL (ref 80.0–100.0)
Monocytes Absolute: 1.4 10*3/uL — ABNORMAL HIGH (ref 0.1–1.0)
Monocytes Relative: 13 %
Neutro Abs: 8 10*3/uL — ABNORMAL HIGH (ref 1.7–7.7)
Neutrophils Relative %: 72 %
Platelets: 143 10*3/uL — ABNORMAL LOW (ref 150–400)
RBC: 4.04 MIL/uL (ref 3.87–5.11)
RDW: 15.6 % — ABNORMAL HIGH (ref 11.5–15.5)
WBC: 11.2 10*3/uL — ABNORMAL HIGH (ref 4.0–10.5)
nRBC: 0 % (ref 0.0–0.2)

## 2022-05-03 LAB — PROTIME-INR
INR: 1.3 — ABNORMAL HIGH (ref 0.8–1.2)
Prothrombin Time: 16 seconds — ABNORMAL HIGH (ref 11.4–15.2)

## 2022-05-03 LAB — APTT: aPTT: 33 seconds (ref 24–36)

## 2022-05-03 LAB — LACTIC ACID, PLASMA: Lactic Acid, Venous: 2.1 mmol/L (ref 0.5–1.9)

## 2022-05-03 MED ORDER — SODIUM CHLORIDE 0.9 % IV BOLUS
1000.0000 mL | Freq: Once | INTRAVENOUS | Status: AC
  Administered 2022-05-04: 1000 mL via INTRAVENOUS

## 2022-05-03 NOTE — ED Triage Notes (Signed)
[facility] from [facility] has a fever and tested positive for Covid. Unknown test date. Ems vitals 90% 2l oxygen, bp 100/74

## 2022-05-04 ENCOUNTER — Other Ambulatory Visit: Payer: Self-pay

## 2022-05-04 ENCOUNTER — Encounter (HOSPITAL_COMMUNITY): Payer: Self-pay | Admitting: Internal Medicine

## 2022-05-04 DIAGNOSIS — F03918 Unspecified dementia, unspecified severity, with other behavioral disturbance: Secondary | ICD-10-CM | POA: Diagnosis not present

## 2022-05-04 DIAGNOSIS — Z79899 Other long term (current) drug therapy: Secondary | ICD-10-CM | POA: Diagnosis not present

## 2022-05-04 DIAGNOSIS — I10 Essential (primary) hypertension: Secondary | ICD-10-CM

## 2022-05-04 DIAGNOSIS — N1831 Chronic kidney disease, stage 3a: Secondary | ICD-10-CM | POA: Diagnosis present

## 2022-05-04 DIAGNOSIS — Z7982 Long term (current) use of aspirin: Secondary | ICD-10-CM | POA: Diagnosis not present

## 2022-05-04 DIAGNOSIS — Z7401 Bed confinement status: Secondary | ICD-10-CM

## 2022-05-04 DIAGNOSIS — J189 Pneumonia, unspecified organism: Secondary | ICD-10-CM | POA: Diagnosis not present

## 2022-05-04 DIAGNOSIS — U071 COVID-19: Secondary | ICD-10-CM | POA: Diagnosis present

## 2022-05-04 DIAGNOSIS — E039 Hypothyroidism, unspecified: Secondary | ICD-10-CM | POA: Diagnosis present

## 2022-05-04 DIAGNOSIS — I129 Hypertensive chronic kidney disease with stage 1 through stage 4 chronic kidney disease, or unspecified chronic kidney disease: Secondary | ICD-10-CM

## 2022-05-04 DIAGNOSIS — E872 Acidosis, unspecified: Secondary | ICD-10-CM | POA: Diagnosis present

## 2022-05-04 DIAGNOSIS — E1322 Other specified diabetes mellitus with diabetic chronic kidney disease: Secondary | ICD-10-CM

## 2022-05-04 DIAGNOSIS — J9601 Acute respiratory failure with hypoxia: Principal | ICD-10-CM | POA: Diagnosis present

## 2022-05-04 DIAGNOSIS — R6520 Severe sepsis without septic shock: Secondary | ICD-10-CM | POA: Diagnosis present

## 2022-05-04 DIAGNOSIS — A4189 Other specified sepsis: Secondary | ICD-10-CM | POA: Diagnosis present

## 2022-05-04 DIAGNOSIS — E785 Hyperlipidemia, unspecified: Secondary | ICD-10-CM | POA: Diagnosis present

## 2022-05-04 DIAGNOSIS — Z79890 Hormone replacement therapy (postmenopausal): Secondary | ICD-10-CM | POA: Diagnosis not present

## 2022-05-04 DIAGNOSIS — Z87891 Personal history of nicotine dependence: Secondary | ICD-10-CM | POA: Diagnosis not present

## 2022-05-04 DIAGNOSIS — F0393 Unspecified dementia, unspecified severity, with mood disturbance: Secondary | ICD-10-CM | POA: Diagnosis present

## 2022-05-04 DIAGNOSIS — N183 Chronic kidney disease, stage 3 unspecified: Secondary | ICD-10-CM

## 2022-05-04 DIAGNOSIS — E1122 Type 2 diabetes mellitus with diabetic chronic kidney disease: Secondary | ICD-10-CM | POA: Diagnosis present

## 2022-05-04 DIAGNOSIS — N179 Acute kidney failure, unspecified: Secondary | ICD-10-CM | POA: Diagnosis present

## 2022-05-04 DIAGNOSIS — J1282 Pneumonia due to coronavirus disease 2019: Secondary | ICD-10-CM | POA: Diagnosis present

## 2022-05-04 DIAGNOSIS — F39 Unspecified mood [affective] disorder: Secondary | ICD-10-CM

## 2022-05-04 HISTORY — DX: Acute respiratory failure with hypoxia: J96.01

## 2022-05-04 HISTORY — DX: COVID-19: U07.1

## 2022-05-04 HISTORY — DX: Pneumonia, unspecified organism: J18.9

## 2022-05-04 LAB — URINALYSIS, ROUTINE W REFLEX MICROSCOPIC
Bilirubin Urine: NEGATIVE
Glucose, UA: NEGATIVE mg/dL
Hgb urine dipstick: NEGATIVE
Ketones, ur: NEGATIVE mg/dL
Nitrite: NEGATIVE
Protein, ur: 30 mg/dL — AB
Specific Gravity, Urine: 1.029 (ref 1.005–1.030)
pH: 5 (ref 5.0–8.0)

## 2022-05-04 LAB — RESPIRATORY PANEL BY PCR

## 2022-05-04 LAB — GLUCOSE, CAPILLARY
Glucose-Capillary: 104 mg/dL — ABNORMAL HIGH (ref 70–99)
Glucose-Capillary: 121 mg/dL — ABNORMAL HIGH (ref 70–99)
Glucose-Capillary: 108 mg/dL — ABNORMAL HIGH (ref 70–99)

## 2022-05-04 LAB — TSH: TSH: 0.607 u[IU]/mL (ref 0.350–4.500)

## 2022-05-04 LAB — CBG MONITORING, ED: Glucose-Capillary: 94 mg/dL (ref 70–99)

## 2022-05-04 LAB — RESP PANEL BY RT-PCR (FLU A&B, COVID) ARPGX2
Influenza A by PCR: NEGATIVE
Influenza B by PCR: NEGATIVE
SARS Coronavirus 2 by RT PCR: POSITIVE — AB

## 2022-05-04 LAB — HEMOGLOBIN A1C
Hgb A1c MFr Bld: 5.8 % — ABNORMAL HIGH (ref 4.8–5.6)
Mean Plasma Glucose: 119.76 mg/dL

## 2022-05-04 LAB — LACTIC ACID, PLASMA: Lactic Acid, Venous: 2 mmol/L (ref 0.5–1.9)

## 2022-05-04 LAB — PROCALCITONIN: Procalcitonin: 0.64 ng/mL

## 2022-05-04 LAB — FERRITIN: Ferritin: 45 ng/mL (ref 11–307)

## 2022-05-04 LAB — D-DIMER, QUANTITATIVE: D-Dimer, Quant: 1.29 ug/mL-FEU — ABNORMAL HIGH (ref 0.00–0.50)

## 2022-05-04 LAB — C-REACTIVE PROTEIN: CRP: 7.7 mg/dL — ABNORMAL HIGH (ref <1.0)

## 2022-05-04 MED ORDER — ATORVASTATIN CALCIUM 40 MG PO TABS
40.0000 mg | ORAL_TABLET | Freq: Every day | ORAL | Status: DC
  Administered 2022-05-04 – 2022-05-08 (×5): 40 mg via ORAL
  Filled 2022-05-04 (×5): qty 1

## 2022-05-04 MED ORDER — SODIUM CHLORIDE 0.9 % IV BOLUS
1000.0000 mL | Freq: Once | INTRAVENOUS | Status: AC
  Administered 2022-05-04: 1000 mL via INTRAVENOUS

## 2022-05-04 MED ORDER — NIRMATRELVIR/RITONAVIR (PAXLOVID)TABLET: 3.0000 | ORAL_TABLET | Freq: Two times a day (BID) | ORAL | Status: DC

## 2022-05-04 MED ORDER — ESCITALOPRAM OXALATE 10 MG PO TABS
10.0000 mg | ORAL_TABLET | Freq: Every day | ORAL | Status: DC
  Administered 2022-05-04 – 2022-05-06 (×3): 10 mg via ORAL
  Filled 2022-05-04 (×3): qty 1

## 2022-05-04 MED ORDER — ACETAMINOPHEN 650 MG RE SUPP
650.0000 mg | Freq: Once | RECTAL | Status: AC
  Administered 2022-05-04: 650 mg via RECTAL
  Filled 2022-05-04: qty 1

## 2022-05-04 MED ORDER — QUETIAPINE FUMARATE 25 MG PO TABS: 50.0000 mg | ORAL_TABLET | Freq: Two times a day (BID) | ORAL | Status: DC

## 2022-05-04 MED ORDER — ACETAMINOPHEN 650 MG RE SUPP: 650.0000 mg | Freq: Four times a day (QID) | RECTAL | Status: DC | PRN

## 2022-05-04 MED ORDER — ASPIRIN 81 MG PO TBEC
81.0000 mg | DELAYED_RELEASE_TABLET | Freq: Every day | ORAL | Status: DC
  Administered 2022-05-04 – 2022-05-09 (×6): 81 mg via ORAL
  Filled 2022-05-04 (×6): qty 1

## 2022-05-04 MED ORDER — DONEPEZIL HCL 10 MG PO TABS
10.0000 mg | ORAL_TABLET | Freq: Every day | ORAL | Status: DC
  Administered 2022-05-04 – 2022-05-05 (×2): 10 mg via ORAL
  Filled 2022-05-04 (×3): qty 1

## 2022-05-04 MED ORDER — QUETIAPINE FUMARATE 50 MG PO TABS
50.0000 mg | ORAL_TABLET | Freq: Two times a day (BID) | ORAL | Status: DC
  Administered 2022-05-05 – 2022-05-09 (×9): 50 mg via ORAL
  Filled 2022-05-04: qty 2
  Filled 2022-05-04 (×6): qty 1
  Filled 2022-05-04 (×2): qty 2

## 2022-05-04 MED ORDER — INSULIN ASPART 100 UNIT/ML IJ SOLN: 0.0000 [IU] | Freq: Every day | INTRAMUSCULAR | Status: DC

## 2022-05-04 MED ORDER — VALBENAZINE TOSYLATE 40 MG PO CAPS
80.0000 mg | ORAL_CAPSULE | Freq: Every day | ORAL | Status: DC
  Administered 2022-05-05 – 2022-05-09 (×4): 80 mg via ORAL
  Filled 2022-05-04 (×5): qty 2

## 2022-05-04 MED ORDER — INSULIN ASPART 100 UNIT/ML IJ SOLN
0.0000 [IU] | Freq: Three times a day (TID) | INTRAMUSCULAR | Status: DC
  Administered 2022-05-04 – 2022-05-09 (×5): 1 [IU] via SUBCUTANEOUS

## 2022-05-04 MED ORDER — MEMANTINE HCL 10 MG PO TABS
10.0000 mg | ORAL_TABLET | Freq: Two times a day (BID) | ORAL | Status: DC
  Administered 2022-05-04 – 2022-05-09 (×11): 10 mg via ORAL
  Filled 2022-05-04 (×14): qty 1

## 2022-05-04 MED ORDER — ALBUTEROL SULFATE (2.5 MG/3ML) 0.083% IN NEBU: 2.5000 mg | INHALATION_SOLUTION | RESPIRATORY_TRACT | Status: DC | PRN

## 2022-05-04 MED ORDER — DIVALPROEX SODIUM 125 MG PO CSDR
125.0000 mg | DELAYED_RELEASE_CAPSULE | Freq: Three times a day (TID) | ORAL | Status: DC
  Administered 2022-05-04 – 2022-05-09 (×16): 125 mg via ORAL
  Filled 2022-05-04 (×22): qty 1

## 2022-05-04 MED ORDER — SODIUM CHLORIDE 0.9 % IV SOLN
1.0000 g | Freq: Once | INTRAVENOUS | Status: AC
  Administered 2022-05-04: 1 g via INTRAVENOUS
  Filled 2022-05-04: qty 10

## 2022-05-04 MED ORDER — ACETAMINOPHEN 160 MG/5ML PO SOLN: 500.0000 mg | Freq: Once | ORAL | Status: DC

## 2022-05-04 MED ORDER — MOLNUPIRAVIR EUA 200MG CAPSULE
4.0000 | ORAL_CAPSULE | Freq: Two times a day (BID) | ORAL | Status: AC
  Administered 2022-05-04 – 2022-05-08 (×10): 800 mg via ORAL
  Filled 2022-05-04 (×3): qty 4

## 2022-05-04 MED ORDER — HEPARIN SODIUM (PORCINE) 5000 UNIT/ML IJ SOLN
5000.0000 [IU] | Freq: Three times a day (TID) | INTRAMUSCULAR | Status: DC
  Administered 2022-05-04 – 2022-05-09 (×17): 5000 [IU] via SUBCUTANEOUS
  Filled 2022-05-04 (×17): qty 1

## 2022-05-04 MED ORDER — METHYLPREDNISOLONE SODIUM SUCC 40 MG IJ SOLR
40.0000 mg | Freq: Every day | INTRAMUSCULAR | Status: DC
  Administered 2022-05-04 – 2022-05-05 (×2): 40 mg via INTRAVENOUS
  Filled 2022-05-04 (×2): qty 1

## 2022-05-04 MED ORDER — ONDANSETRON HCL 4 MG/2ML IJ SOLN: 4.0000 mg | Freq: Four times a day (QID) | INTRAMUSCULAR | Status: DC | PRN

## 2022-05-04 MED ORDER — SODIUM CHLORIDE 0.9 % IV SOLN
500.0000 mg | Freq: Once | INTRAVENOUS | Status: AC
  Administered 2022-05-04: 500 mg via INTRAVENOUS
  Filled 2022-05-04: qty 5

## 2022-05-04 MED ORDER — VALBENAZINE TOSYLATE 40 MG PO CAPS
80.0000 mg | ORAL_CAPSULE | Freq: Every day | ORAL | Status: DC
  Filled 2022-05-04: qty 2

## 2022-05-04 MED ORDER — ACETAMINOPHEN 325 MG PO TABS: 650.0000 mg | ORAL_TABLET | Freq: Four times a day (QID) | ORAL | Status: DC | PRN

## 2022-05-04 MED ORDER — LEVOTHYROXINE SODIUM 75 MCG PO TABS
175.0000 ug | ORAL_TABLET | Freq: Every day | ORAL | Status: DC
  Administered 2022-05-05 – 2022-05-09 (×5): 175 ug via ORAL
  Filled 2022-05-04 (×5): qty 1

## 2022-05-04 MED ORDER — ONDANSETRON HCL 4 MG PO TABS: 4.0000 mg | ORAL_TABLET | Freq: Four times a day (QID) | ORAL | Status: DC | PRN

## 2022-05-04 NOTE — Assessment & Plan Note (Signed)
Add SSI. ?

## 2022-05-04 NOTE — ED Notes (Signed)
CBG 94 

## 2022-05-04 NOTE — Assessment & Plan Note (Signed)
Observation med/surg bed. Awaiting covid testing. Reportedly SNF has residents with Covid. CXR does not look like covid. Check procalcitonin. Pt given 1 dose of Rocephin/Zithromax. Awaiting respiratory viral panel. Hold on further abx pending viral testing.

## 2022-05-04 NOTE — Assessment & Plan Note (Addendum)
See above. Given 1 dose of IV Rocephin/zithromax. Unknown how long ago pt was exposed to coivd. No wheezing. Awaiting covid testing. Hold off on decadron and molnupiravir.

## 2022-05-04 NOTE — Evaluation (Signed)
Clinical/Bedside Swallow Evaluation Patient Details  Name: Rhia Blatchford MRN: [id number] Date of Birth: 08/19/1934  Today's Date: 05/04/2022 Time: SLP Start Time (ACUTE ONLY): 0849 SLP Stop Time (ACUTE ONLY): 0900 SLP Time Calculation (min) (ACUTE ONLY): 11 min  Past Medical History:  Past Medical History:  Diagnosis Date   Chronic kidney disease    Stage III   Dementia with behavioral disturbance (HCC) 03/19/2015   Notated on FL2 Form from Dr. Dreama Saa [phone]   Depression    previously saw Dr Donell Beers   Diabetes mellitus without complication (HCC)    11/18/15 A1c 6%   Dyslipidemia    11/18/15 Tg 74, HDL 60, LDL 90 on statin   Fracture of radial head, closed 05/12/2021   05/09/2021 elbow films revealed possible subtle transversely oriented fracture left radial head.  Effusion and soft tissue edema noted over the olecranon.   Hypertension    Hypothyroidism    11/18/15 TSH 53.6, 02/07/16 TSH 9.42 @ [facility]   MDD (major depressive disorder)    With behavioral disturbance   Scabies    02/21/16 [facility],[place]; Rx: Permethrin cream   Urinary tract infection due to Proteus 05/04/2019   Uniformly sensitive except to nitrofurantoin; Cipro prescribed   Vitamin D deficiency    11/18/15 vitamin D 25-hydroxy 39   Past Surgical History:  Past Surgical History:  Procedure Laterality Date   no record     04/10/15 [facility] note: see old chart   HPI:  87 year old African-American female from SNF history of bedbound status, dementia with behavioral disturbance, history of mood disorder with psychosis, diabetes type 2, CKD stage IIIa, hypertension presents to the ER today with hypoxia and tested positive for Covid.    Assessment / Plan / Recommendation  Clinical Impression  Pt was alert, verbally responsive with confusion noted for swallow assessment. She had difficulty following commands for oromotor evaluation however there were facial symmetry and strength appeared  intact. She is missing upper and lower posterior dentition. Attention to mastication and transit of solid texture was adequate with minimal lingual residue cleared with sips thin. During trials of thin, puree and cracker she did not exhibit s/s aspiration. Once reclined there was a delayed cough that did not appear related to her intake rather from repositioning in setting of Covid infection. Given incomplete dentition and current illness, recommend Dys 3 (chopped meats) then reassess with SLP at SNF for upgrade to regular. No further services needed while in hospital. SLP Visit Diagnosis: Dysphagia, unspecified (R13.10)    Aspiration Risk  Mild aspiration risk    Diet Recommendation Dysphagia 3 (Mech soft);Thin liquid   Liquid Administration via: Straw;Cup Medication Administration: Crushed with puree Supervision: Staff to assist with self feeding;Full supervision/cueing for compensatory strategies Compensations: Slow rate;Small sips/bites Postural Changes: Seated upright at 90 degrees    Other  Recommendations Oral Care Recommendations: Oral care BID    Recommendations for follow up therapy are one component of a multi-disciplinary discharge planning process, led by the attending physician.  Recommendations may be updated based on patient status, additional functional criteria and insurance authorization.  Follow up Recommendations No SLP follow up      Assistance Recommended at Discharge    Functional Status Assessment    Frequency and Duration            Prognosis        Swallow Study   General Date of Onset: 05/03/22 HPI: 87 year old African-American female from SNF history of bedbound status, dementia  with behavioral disturbance, history of mood disorder with psychosis, diabetes type 2, CKD stage IIIa, hypertension presents to the ER today with hypoxia and tested positive for Covid. Type of Study: Bedside Swallow Evaluation Previous Swallow Assessment:  (none) Diet Prior  to this Study: NPO Temperature Spikes Noted: No Respiratory Status: Nasal cannula History of Recent Intubation: No Behavior/Cognition: Alert;Cooperative;Pleasant mood;Confused;Requires cueing Oral Cavity Assessment: Dry Oral Care Completed by SLP: No Oral Cavity - Dentition: Missing dentition Vision: Functional for self-feeding Self-Feeding Abilities: Needs assist Patient Positioning: Upright in bed Baseline Vocal Quality: Normal Volitional Cough: Cognitively unable to elicit Volitional Swallow: Unable to elicit    Oral/Motor/Sensory Function Overall Oral Motor/Sensory Function:  (diff following commands, no focal weakness)   Ice Chips Ice chips: Not tested   Thin Liquid Thin Liquid: Within functional limits Presentation: Straw    Nectar Thick Nectar Thick Liquid: Not tested   Honey Thick Honey Thick Liquid: Not tested   Puree Puree: Within functional limits   Solid     Solid: Impaired Oral Phase Functional Implications: Oral residue (min) Pharyngeal Phase Impairments: Cough - Delayed      Roque Cash Breck Coons 05/04/2022,9:58 AM

## 2022-05-04 NOTE — ED Provider Notes (Signed)
[facility] EMERGENCY DEPARTMENT Provider Note   CSN: [id number] Arrival date & time: 05/03/22  2147     History  Chief Complaint  Patient presents with   Shortness of Breath    Kelly [name] is a 87 y.o. female.  HPI   Patient with medical history including hypertension, diabetes, CKD, dementia, presents from [facility] nursing facility via EMS due to URI-like symptoms.  HPI was obtained from nursing staff as patient was unable to communicate with me due to her dementia, nursing staff states that patient tested positive for COVID yesterday, she is not showing any symptoms, apparently other residents had COVID and they were testing everyone, symptoms started today, they states that she has been more fatigued and tired, she has not been eating or drinking very much, at baseline she is bedbound, she is unable to communicate but will fall very simple commands, there is been no recent falls, not on anticoag's, they states that she had a fever in the 100s today, and had a slight cough, no other complaints.  She is not on oxygen at baseline.    Home Medications Prior to Admission medications   Medication Sig Start Date End Date Taking? Authorizing Provider  acetaminophen (TYLENOL) 325 MG tablet Take 650 mg by mouth every 6 (six) hours as needed.    [provider]  aspirin EC 81 MG tablet Take 81 mg by mouth daily.     [provider]  atorvastatin (LIPITOR) 40 MG tablet Take 40 mg by mouth at bedtime.     [provider]  bisacodyl (DULCOLAX) 10 MG suppository If not relieved by MOM, give 10 mg Bisacodyl suppositiory rectally X 1 dose in 24 hours as needed (Do not use constipation standing orders for residents with renal failure/CFR less than 30. Contact MD for orders) (Physician Order)    [provider]  Calcium Carb-Cholecalciferol (CALCIUM-VITAMIN D3) 600-400 MG-UNIT TABS Take 1 tablet by mouth 2 (two) times daily.    [provider]  carbamide peroxide (DEBROX) 6.5 % OTIC solution 2 (two) times daily. 2-3 drops to right ear 3 times a week until next audiology appointment    [provider]  Carboxymethylcellulose Sodium (ARTIFICIAL TEARS OP) Apply to eye. Instill 2 gtts to both eyes twice daily Dx: dry eyes    [provider]  cholecalciferol (VITAMIN D3) 25 MCG (1000 UNIT) tablet Take 2,000 Units by mouth daily. Take 2 tablet (2000 unit total) by mouth one time daily for vitamin deficiency    [provider]  divalproex (DEPAKOTE) 125 MG DR tablet Take 125 mg by mouth 4 (four) times daily.    [provider]  donepezil (ARICEPT) 10 MG tablet Take 10 mg by mouth at bedtime.     [provider]  escitalopram (LEXAPRO) 5 MG/5ML solution Take 10 mg by mouth daily.    [provider]  feeding supplement (BOOST HIGH PROTEIN) LIQD Take 1 Container by mouth daily. IF ENSURE IS UNAVAILABLE    [provider]  furosemide (LASIX) 20 MG tablet Take 20 mg by mouth every Monday, Wednesday, and Friday.     [provider]  levothyroxine (SYNTHROID) 137 MCG tablet Take 175 mcg by mouth daily before breakfast.    [provider]  LORazepam (ATIVAN) 2 MG/ML concentrated solution Give 0.5 mg/ml gel apply topically to skin three times a day 10/18/21   Medina-Vargas, Monina C, NP  magnesium hydroxide (MILK OF MAGNESIA) 400 MG/5ML suspension If  no BM in 3 days, give 30 cc Milk of Magnesium p.o. x 1 dose in 24 hours as needed (Do not use standing constipation orders for residents with renal failure CFR less than 30. Contact MD for orders) (Physician Order)    [provider]  memantine (NAMENDA) 10 MG tablet Take 10 mg by mouth 2 (two) times daily.     [provider]  Multiple Vitamin (MULTI-VITAMIN DAILY PO) Take one tablet by mouth once daily    [provider]  NON FORMULARY DIET: REGULAR, THIN LIQUIDS    [provider]   Nutritional Supplements (ENSURE ENLIVE PO) ENSURE ENLIVE LIQUID GIVE 8 OUNCE BY MOUTH ONCE DAILY FOR SUPPLEMENT    [provider]  Nutritional Supplements (NUTRITIONAL SUPPLEMENT PO) Take 1 each by mouth daily. Magic Cup to help stabilize weight    [provider]  QUEtiapine (SEROQUEL) 25 MG tablet Take 75 mg by mouth 2 (two) times daily. FOR DEPRESSION/AGITATION. MONITOR FOR SEDATION AND HOLD.    [provider]  sennosides-docusate sodium (SENOKOT-S) 8.6-50 MG tablet Take 2 tablets by mouth daily.     [provider]  Skin Protectants, Misc. (MINERIN) CREA Apply topically to ankles twice a day for dry skin.    [provider]  Sodium Phosphates (RA SALINE ENEMA RE) If not relieved by Biscodyl suppository, give disposable Saline Enema rectally X 1 dose/24 hrs as needed (Do not use constipation standing orders for residents with renal failure/CFR less than 30. Contact MD for orders)(Physician Or    [provider]      Allergies    Patient has no known allergies.    Review of Systems   Review of Systems  Unable to perform ROS: Dementia    Physical Exam Updated Vital Signs BP (!) 110/50   Pulse 73   Temp (!) 101.4 F (38.6 C) (Rectal)   Resp 18   SpO2 95%  Physical Exam Vitals and nursing note reviewed.  Constitutional:      General: She is not in acute distress.    Appearance: Normal appearance. She is ill-appearing.     Comments: Deconditioned state, ill-appearing  HENT:     Head: Normocephalic and atraumatic.     Comments: There is no deformity of the head present no raccoon eyes or battle sign noted.    Nose: No congestion.     Mouth/Throat:     Mouth: Mucous membranes are dry.     Pharynx: Oropharynx is clear.  Eyes:     Extraocular Movements: Extraocular movements intact.     Conjunctiva/sclera: Conjunctivae normal.     Pupils: Pupils are equal, round, and reactive to light.  Cardiovascular:     Rate and Rhythm:  Normal rate and regular rhythm.     Pulses: Normal pulses.     Heart sounds: No murmur heard.    No friction rub. No gallop.  Pulmonary:     Effort: No respiratory distress.     Breath sounds: Rhonchi and rales present. No wheezing.     Comments: Currently on 2 L via nasal cannula, patient was in the upper 80s while on room air, since being on 2 L O2 sats are in the mid 90s, she is nontachypneic, no assessor muscle usage, shows a productive cough, with rhonchi present lower lobes bilaterally. Abdominal:     Palpations: Abdomen is soft.     Tenderness: There is no abdominal tenderness. There is no right CVA tenderness or left CVA  tenderness.  Skin:    General: Skin is warm and dry.     Comments: No noted breakdown of skin on patient's back or buttocks  Neurological:     Mental Status: She is alert.     Comments: No facial asymmetry, no unilateral weakness present.  Psychiatric:        Mood and Affect: Mood normal.     ED Results / Procedures / Treatments   Labs (all labs ordered are listed, but only abnormal results are displayed) Labs Reviewed  LACTIC ACID, PLASMA - Abnormal; Notable for the following components:      Result Value   Lactic Acid, Venous 2.1 (*)    All other components within normal limits  COMPREHENSIVE METABOLIC PANEL - Abnormal; Notable for the following components:   Glucose, Bld 135 (*)    Creatinine, Ser 1.19 (*)    Albumin 3.1 (*)    AST 44 (*)    GFR, Estimated 45 (*)    All other components within normal limits  CBC WITH DIFFERENTIAL/PLATELET - Abnormal; Notable for the following components:   WBC 11.2 (*)    RDW 15.6 (*)    Platelets 143 (*)    Neutro Abs 8.0 (*)    Monocytes Absolute 1.4 (*)    All other components within normal limits  PROTIME-INR - Abnormal; Notable for the following components:   Prothrombin Time 16.0 (*)    INR 1.3 (*)    All other components within normal limits  CULTURE, BLOOD (ROUTINE X 2)  CULTURE, BLOOD (ROUTINE X 2)   URINE CULTURE  APTT  LACTIC ACID, PLASMA  URINALYSIS, ROUTINE W REFLEX MICROSCOPIC    EKG None  Radiology DG Chest Port 1 View  Result Date: 05/03/2022 CLINICAL DATA:  Questionable sepsis - evaluate for abnormality EXAM: PORTABLE CHEST 1 VIEW COMPARISON:  Chest x-ray 05/09/2021 FINDINGS: The heart and mediastinal contours are unchanged. Aortic calcification. Low lung volumes. Developing hazy airspace opacities at the left lower lung base and right mid lung zone. No pulmonary edema. No definite pleural effusion. No pneumothorax. No acute osseous abnormality. IMPRESSION: 1. Low lung volumes. Developing hazy airspace opacities at the left lower lung base and right mid lung zone. Consider repeating PA and lateral view of the chest with improved inspiratory effort. 2.  Aortic Atherosclerosis (ICD10-I70.0). Electronically Signed   By: Tish Frederickson M.D.   On: 05/03/2022 23:06    Procedures .Critical Care  Performed by: Carroll Sage, PA-C Authorized by: Carroll Sage, PA-C   Critical care provider statement:    Critical care time (minutes):  30   Critical care time was exclusive of:  Separately billable procedures and treating other patients   Critical care was time spent personally by me on the following activities:  Development of treatment plan with patient or surrogate, discussions with consultants, evaluation of patient's response to treatment, examination of patient, ordering and review of laboratory studies, ordering and review of radiographic studies, ordering and performing treatments and interventions, pulse oximetry, re-evaluation of patient's condition and review of old charts   I assumed direction of critical care for this patient from another provider in my specialty: no     Care discussed with: admitting provider       Medications Ordered in ED Medications  sodium chloride 0.9 % bolus 1,000 mL (1,000 mLs Intravenous New Bag/Given 05/04/22 0009)    ED Course/  Medical Decision Making/ A&P  Medical Decision Making Amount and/or Complexity of Data Reviewed Labs: ordered. Radiology: ordered. ECG/medicine tests: ordered.  Risk OTC drugs. Decision regarding hospitalization.   This patient presents to the ED for concern of COVID, this involves an extensive number of treatment options, and is a complaint that carries with it a high risk of complications and morbidity.  The differential diagnosis includes sepsis, pneumonia, PE    Additional history obtained:  Additional history obtained from nursing staff from facility External records from outside source obtained and reviewed including geriatric notes   Co morbidities that complicate the patient evaluation  Diabetes, hypertension  Social Determinants of Health:  Dementia    Lab Tests:  I Ordered, and personally interpreted labs.  The pertinent results include: CBC shows leukocytosis of 11.2, CMP shows glucose of 135, creatinine 1.19, prothrombin time 16 INR 1 3, lactic 2.1   Imaging Studies ordered:  I ordered imaging studies including chest x-ray I independently visualized and interpreted imaging which showed atelectasis versus early developing pneumonia. I agree with the radiologist interpretation   Cardiac Monitoring:  The patient was maintained on a cardiac monitor.  I personally viewed and interpreted the cardiac monitored which showed an underlying rhythm of: Sinus without ischemia   Medicines ordered and prescription drug management:  I ordered medication including f fluids I have reviewed the patients home medicines and have made adjustments as needed  Critical Interventions:  Patient meets code sepsis, febrile, leukocytosis, acidosis, but source, likely from CAP, will start on antibiotic Patient is reassessed resting comfortably she remained stable at this time, lactic acid has improved to 2.1 down to 2 Reevaluation:  Presents with  COVID infection, she is febrile on arrival, will obtain sepsis work-up and continue to monitor.  Patient has a fever of 101 rectally, will provide with Tylenol, lactic is 2.1 will provide with a liter of fluids.  Suspect patient is suffering possible viral URI with underlying pneumonia but she has audible rhonchi present during my examination with a productive cough, x-rays concerning for likely developing pneumonia.  Will admit to hospitalist team    Consultations Obtained:  I requested consultation with the hospitalist Dr. Imogene Burnhen,  and discussed lab and imaging findings as well as pertinent plan - they recommend will admit the patient.    Test Considered:  CT head-deferred administration for intracranial head bleed is low at this time no recent head trauma, not anticoag, patient is mentating at her baseline.    Rule out Suspicion for ACS is low at this time, not endorsing any chest pain, EKG without signs of ischemia.  I doubt PE as she is not tachypneic, not endorsing pleuritic chest pain, she is requiring oxygen but this appears to be more consistent with likely viral URI as she was diagnosed with COVID, she also has evidence of likely underlying pneumonia seen chest x-ray this appears to more consistent with presentation than PE.  I have low suspicion for intra-abdominal abnormality as abdomen soft nontender there is she has a nonsurgical abdomen.   Dispostion and problem list  After consideration of the diagnostic results and the patients response to treatment, I feel that the patent would benefit from admission.  Acute respiratory failure-likely secondary due to viral URI with underlying pneumonia, started on antibiotics, she will need continued monitoring.            Final Clinical Impression(s) / ED Diagnoses Final diagnoses:  None    Rx / DC Orders ED Discharge Orders  None         Carroll Sage, PA-C 05/04/22 8315    Zadie Rhine,  MD 05/04/22 [phone]

## 2022-05-04 NOTE — Assessment & Plan Note (Signed)
On multiple meds for mental health.

## 2022-05-04 NOTE — Progress Notes (Addendum)
[facility]                                                                              Kelly [name], is a 87 y.o. female, DOB - Feb 19, 1935, APO:[id number] Admit date - 05/03/2022    Outpatient Primary MD for the patient is Pcp, No  LOS - 0  days  Chief Complaint  Patient presents with   Shortness of Breath       Brief summary   Patient is a 87 year old female, bedbound status, dementia with behavioral disturbance, mood disorder with psychosis, diabetes mellitus type 2, CKD stage IIIa, HTN presented to ED from SNF with hypoxia.  Apparently there was an outbreak of COVID at the facility.  Patient was sent to the ER for evaluation.  She was on 2 L O2 with sats 98% when EMS arrived.  She also reportedly had a fever and had tested positive for COVID. On arrival, temp 101.4 F, heart rate 87, BP 106/55.  WBC count 11.2, hemoglobin 12.7, platelets 143, lactic acid mildly elevated 2.1 Chest x-ray showed bibasilar airspace opacities, creatinine 1.19, sodium 143.   Assessment & Plan    Principal Problem:   Acute respiratory failure with hypoxia (HCC) secondary to pneumonia due to COVID-19 Severe sepsis secondary to COVID-19 -COVID-19 positive, patient met sepsis criteria on admission with fevers, mild tachycardia, hypotension, lactic acidosis -Received IV fluid hydration, IV antibiotics, procalcitonin 0.64 -Subsequently tested positive for COVID, started on molnupiravir, IV steroids due to hypoxia.  Patient had received 1 dose of IV Zithromax and Rocephin -Wean O2 as tolerated   Active Problems:   Dementia with behavioral disturbance (HCC) -Continue Depakote, Aricept, Lexapro, Seroquel, Ingrezza -Avoid sedating meds    Essential hypertension -BP currently stable    Secondary DM with CKD stage 3 and hypertension (HCC) -Follow CBGs, continue SSI, -Hemoglobin A1c 5.8  Mild AKI on stage 3a chronic kidney disease (CKD) (HCC) -Baseline creatinine 0.8 -Creatinine  1.1 on admission, with lactic acidosis -Received 2 L IV fluid bolus    Bedbound -Appears to be at baseline  Code Status: Full code DVT Prophylaxis:  heparin injection 5,000 Units Start: 05/04/22 0600 SCDs Start: 05/04/22 0426   Level of Care: Level of care: Med-Surg Family Communication: Updated patient's daughter, April Cox on phone today    Disposition Plan:      Remains inpatient appropriate: Started on COVID treatment   Procedures:  None  Consultants:   None  Antimicrobials:   Anti-infectives (From admission, onward)    Start     Dose/Rate Route Frequency Ordered Stop   05/04/22 1000  nirmatrelvir/ritonavir EUA (PAXLOVID) 3 tablet  Status:  Discontinued        3 tablet Oral 2 times daily 05/04/22 0625 05/04/22 0704   05/04/22 0800  molnupiravir EUA (LAGEVRIO) capsule 800 mg        4 capsule Oral 2 times daily 05/04/22 0705 05/09/22 0959   05/04/22 0030  cefTRIAXone (ROCEPHIN) 1 g in sodium chloride 0.9 % 100 mL IVPB        1 g 200 mL/hr over 30 Minutes Intravenous  Once 05/04/22 0023 05/04/22 0140  05/04/22 0030  azithromycin (ZITHROMAX) 500 mg in sodium chloride 0.9 % 250 mL IVPB        500 mg 250 mL/hr over 60 Minutes Intravenous  Once 05/04/22 0023 05/04/22 0242          Medications  acetaminophen (TYLENOL) oral liquid 160 mg/5 mL  500 mg Oral Once   aspirin EC  81 mg Oral Daily   atorvastatin  40 mg Oral QHS   divalproex  125 mg Oral Q8H   donepezil  10 mg Oral QHS   escitalopram  10 mg Oral Daily   heparin  5,000 Units Subcutaneous Q8H   insulin aspart  0-5 Units Subcutaneous QHS   insulin aspart  0-9 Units Subcutaneous TID WC   levothyroxine  175 mcg Oral QAC breakfast   memantine  10 mg Oral BID   methylPREDNISolone (SOLU-MEDROL) injection  40 mg Intravenous Daily   molnupiravir EUA  4 capsule Oral BID   [START ON 05/05/2022] QUEtiapine  50 mg Oral BID   [START ON 05/05/2022] valbenazine  80 mg Oral Daily      Subjective:   Kelly [name]  was seen and examined today.  Alert and oriented to place, no acute complaints, no acute shortness of breath, chest pain.  Review of system difficult to obtain due to dementia  Objective:   Vitals:   05/04/22 0630 05/04/22 0800 05/04/22 0830 05/04/22 1021  BP: (!) 107/95 122/64 (!) 133/59   Pulse: (!) 53 (!) 54 (!) 56   Resp: _0 Temp:    (!) 97.4 F (36.3 C)  TempSrc:    Oral  SpO2: 99% 99% 100%    No intake or output data in the 24 hours ending 05/04/22 1039   Wt Readings from Last 3 Encounters:  11/07/21 75.7 kg  10/24/21 75.7 kg  10/20/21 75.7 kg     Exam General: Alert and oriented x, NAD, ill-appearing Cardiovascular: S1 S2 auscultated,  RRR Respiratory: decreased Breath sounds at the bases Gastrointestinal: Soft, nontender, nondistended, + bowel sounds Ext: no pedal edema bilaterally Neuro: moving all 4 extremities Psych: dementia    Data Reviewed:  I have personally reviewed following labs    CBC Lab Results  Component Value Date   WBC 11.2 (H) 05/03/2022   RBC 4.04 05/03/2022   HGB 12.7 05/03/2022   HCT 37.5 05/03/2022   MCV 92.8 05/03/2022   MCH 31.4 05/03/2022   PLT 143 (L) 05/03/2022   MCHC 33.9 05/03/2022   RDW 15.6 (H) 05/03/2022   LYMPHSABS 1.7 05/03/2022   MONOABS 1.4 (H) 05/03/2022   EOSABS 0.0 05/03/2022   BASOSABS 0.0 94/17/4081     Last metabolic panel Lab Results  Component Value Date   NA 143 05/03/2022   K 3.9 05/03/2022   CL 110 05/03/2022   CO2 23 05/03/2022   BUN 22 05/03/2022   CREATININE 1.19 (H) 05/03/2022   GLUCOSE 135 (H) 05/03/2022   GFRNONAA 45 (L) 05/03/2022   GFRAA 80.74 07/13/2021   CALCIUM 9.0 05/03/2022   PROT 6.6 05/03/2022   ALBUMIN 3.1 (L) 05/03/2022   BILITOT 0.4 05/03/2022   ALKPHOS 68 05/03/2022   AST 44 (H) 05/03/2022   ALT 20 05/03/2022   ANIONGAP 10 05/03/2022    CBG (last 3)  Recent Labs    05/04/22 0802  GLUCAP 94      Coagulation Profile: Recent Labs  Lab 05/03/22 2249   INR 1.3*     Radiology Studies: I  have personally reviewed the imaging studies  DG Chest Port 1 View  Result Date: 05/03/2022 CLINICAL DATA:  Questionable sepsis - evaluate for abnormality EXAM: PORTABLE CHEST 1 VIEW COMPARISON:  Chest x-ray 05/09/2021 FINDINGS: The heart and mediastinal contours are unchanged. Aortic calcification. Low lung volumes. Developing hazy airspace opacities at the left lower lung base and right mid lung zone. No pulmonary edema. No definite pleural effusion. No pneumothorax. No acute osseous abnormality. IMPRESSION: 1. Low lung volumes. Developing hazy airspace opacities at the left lower lung base and right mid lung zone. Consider repeating PA and lateral view of the chest with improved inspiratory effort. 2.  Aortic Atherosclerosis (ICD10-I70.0). Electronically Signed   By: Iven Finn M.D.   On: 05/03/2022 23:06       Othar Curto M.D. [facility] 05/04/2022, 10:39 AM  Available via Epic secure chat 7am-7pm After 7 pm, please refer to night coverage provider listed on amion.

## 2022-05-04 NOTE — Assessment & Plan Note (Signed)
Baseline Scr 0.8-1.0. hold lasix.

## 2022-05-04 NOTE — Assessment & Plan Note (Signed)
Chronic. 

## 2022-05-04 NOTE — ED Notes (Signed)
Pt meds given crushed w/ applesauce

## 2022-05-04 NOTE — Subjective & Objective (Signed)
CC: hypoxia HPI: 87 year old African-American female history of bedbound status, dementia with behavioral disturbance, history of mood disorder with psychosis, diabetes type 2, CKD stage IIIa, hypertension presents to the ER today with hypoxia.  Patient lives at [facility].  Apparently there is an outbreak of COVID there.  Patient sent to the ER for evaluation patient is nonverbal.  Patient was on 2 L of oxygen with sats 98% when EMS arrived.  She reportedly had a fever at [facility].  Triage reports patient tested positive for COVID.  Currently awaiting COVID testing.  On arrival temp 101.4, heart rate 87 blood pressure 106/55.  White count 11.2, hemoglobin 12.7, platelets 143  Lactic acid mildly elevated 2.1  BUN of 22, creatinine 1.19, sodium 143, [place] 3.9  Chest x-rays demonstrated bibasilar airspace opacities.  [facility] contacted for admission.

## 2022-05-04 NOTE — H&P (Signed)
History and Physical    Kelly [name] [id number] DOB: 1934-12-02 DOA: 05/03/2022  DOS: the patient was seen and examined on 05/03/2022  PCP: Pcp, No   Patient coming from: [facility]  I have personally briefly reviewed patient's old medical records in [place]  CC: hypoxia HPI: 87 year old African-American female history of bedbound status, dementia with behavioral disturbance, history of mood disorder with psychosis, diabetes type 2, CKD stage IIIa, hypertension presents to the ER today with hypoxia.  Patient lives at [facility].  Apparently there is an outbreak of COVID there.  Patient sent to the ER for evaluation patient is nonverbal.  Patient was on 2 L of oxygen with sats 98% when EMS arrived.  She reportedly had a fever at [place].  Triage reports patient tested positive for COVID.  Currently awaiting COVID testing.  On arrival temp 101.4, heart rate 87 blood pressure 106/55.  White count 11.2, hemoglobin 12.7, platelets 143  Lactic acid mildly elevated 2.1  BUN of 22, creatinine 1.19, sodium 143, [place] 3.9  Chest x-rays demonstrated bibasilar airspace opacities.  [facility] contacted for admission.   ED Course: WBC 11, CXR bibasilar opacities. Covid pending. Febrile to 101  Review of Systems:  Review of Systems  Unable to perform ROS: Dementia    Past Medical History:  Diagnosis Date   Chronic kidney disease    Stage III   Dementia with behavioral disturbance ([place]) 03/19/2015   Notated on FL2 Form from Dr. Agapito Games [phone]   Depression    previously saw Dr Casimiro Needle   Diabetes mellitus without complication ([place])    [id number] A1c 6%   Dyslipidemia    11/18/15 Tg 74, HDL 60, LDL 90 on statin   Fracture of radial head, closed 05/12/2021   05/09/2021 elbow films revealed possible subtle transversely oriented fracture left radial head.  Effusion and soft tissue edema noted over the olecranon.   Hypertension    Hypothyroidism     11/18/15 TSH 53.6, 02/07/16 TSH 9.42 @ [facility]   MDD (major depressive disorder)    With behavioral disturbance   Scabies    02/21/16 [facility],[place]; Rx: Permethrin cream   Urinary tract infection due to Proteus 05/04/2019   Uniformly sensitive except to nitrofurantoin; Cipro prescribed   Vitamin D deficiency    11/18/15 vitamin D 25-hydroxy 39    Past Surgical History:  Procedure Laterality Date   no record     04/10/15 [facility] note: see old chart     reports that she has quit smoking. She has never used smokeless tobacco. She reports that she does not drink alcohol and does not use drugs.  No Known Allergies  Family History  Family history unknown: Yes    Prior to Admission medications   Medication Sig Start Date End Date Taking? Authorizing Provider  acetaminophen (TYLENOL) 325 MG tablet Take 650 mg by mouth every 6 (six) hours as needed.   Yes [provider]  ARTIFICIAL TEAR SOLUTION OP Apply 2 drops to eye in the morning and at bedtime. *Both eyes*   Yes [provider]  aspirin EC 81 MG tablet Take 81 mg by mouth daily.    Yes [provider]  atorvastatin (LIPITOR) 40 MG tablet Take 40 mg by mouth at bedtime.    Yes [provider]  bisacodyl (DULCOLAX) 10 MG suppository Place 10 mg rectally daily as needed for mild constipation or moderate constipation. If not relieved by MOM, give 10  mg Bisacodyl suppositiory rectally X 1 dose in 24 hours as needed (Do not use constipation standing orders for residents with renal failure/CFR less than 30. Contact MD for orders) (Physician Order)   Yes [provider]  Calcium Carb-Cholecalciferol (CALCIUM-VITAMIN D3) 600-400 MG-UNIT TABS Take 1 tablet by mouth 2 (two) times daily.   Yes [provider]  carbamide peroxide (DEBROX) 6.5 % OTIC solution Place 2-3 drops into the right ear See admin instructions. 2-3 drops to right ear 3 times a week until next audiology  appointment   Yes [provider]  cholecalciferol (VITAMIN D3) 25 MCG (1000 UNIT) tablet Take 2,000 Units by mouth daily. Take 2 tablet (2000 unit total) by mouth one time daily for vitamin deficiency   Yes [provider]  divalproex (DEPAKOTE SPRINKLE) 125 MG capsule Take 125 mg by mouth in the morning, at noon, in the evening, and at bedtime.   Yes [provider]  donepezil (ARICEPT) 10 MG tablet Take 10 mg by mouth at bedtime.    Yes [provider]  escitalopram (LEXAPRO) 5 MG/5ML solution Take 10 mg by mouth daily.   Yes [provider]  feeding supplement (BOOST HIGH PROTEIN) LIQD Take 1 Container by mouth daily. IF ENSURE IS UNAVAILABLE   Yes [provider]  furosemide (LASIX) 20 MG tablet Take 20 mg by mouth every Monday, Wednesday, and Friday.    Yes [provider]  levothyroxine (SYNTHROID) 137 MCG tablet Take 175 mcg by mouth daily before breakfast.   Yes [provider]  magnesium hydroxide (MILK OF MAGNESIA) 400 MG/5ML suspension Take 30 mLs by mouth daily as needed for mild constipation or moderate constipation. If no BM in 3 days, give 30 cc Milk of Magnesium p.o. x 1 dose in 24 hours as needed (Do not use standing constipation orders for residents with renal failure CFR less than 30. Contact MD for orders) (Physician Order)   Yes [provider]  memantine (NAMENDA) 10 MG tablet Take 10 mg by mouth 2 (two) times daily.    Yes [provider]  Multiple Vitamin (MULTI-VITAMIN DAILY PO) Take 1 tablet by mouth daily.   Yes [provider]  NON FORMULARY Apply 1 application  topically in the morning, at noon, and at bedtime. Ativan 0.5mg /ml topical Gel   Yes [provider]  Nutritional Supplements (ENSURE ENLIVE PO) Take 8 fluid ounces by mouth daily. ENSURE ENLIVE LIQUID GIVE 8 OUNCE BY MOUTH ONCE DAILY FOR SUPPLEMENT   Yes [provider]  Nutritional Supplements  (NUTRITIONAL SUPPLEMENT PO) Take 1 each by mouth daily. Magic Cup to help stabilize weight   Yes [provider]  QUEtiapine (SEROQUEL) 50 MG tablet Take 50 mg by mouth 2 (two) times daily.   Yes [provider]  sennosides-docusate sodium (SENOKOT-S) 8.6-50 MG tablet Take 2 tablets by mouth daily.    Yes [provider]  Skin Protectants, Misc. (MINERIN) CREA Apply 1 Application topically in the morning and at bedtime. Apply topically to ankles twice a day for dry skin.   Yes [provider]  Sodium Phosphates (RA SALINE ENEMA RE) Place 1 enema rectally daily as needed (Constipation). If not relieved by Biscodyl suppository, give disposable Saline Enema rectally X 1 dose/24 hrs as needed (Do not use constipation standing orders for residents with renal failure/CFR less than 30. Contact MD for orders)(Physician Or   Yes [provider]  valbenazine (INGREZZA) 80 MG capsule Take 80 mg by  mouth daily.   Yes [provider]    Physical Exam: Vitals:   05/04/22 0145 05/04/22 0200 05/04/22 0246 05/04/22 0247  BP: (!) 117/52 (!) 104/50 (!) 100/58   Pulse: 70 65 62 63  Resp:  16 16 18   Temp:      TempSrc:      SpO2: 99% 98% 99% 97%    Physical Exam Vitals and nursing note reviewed.  Constitutional:      General: She is not in acute distress.    Appearance: She is ill-appearing. She is not toxic-appearing.     Comments: Chronically ill-appearing 87 year old African-American female no respiratory distress.  Wearing oxygen.   Right lateral recumbent.  Curled up in fetal position.  HENT:     Head: Normocephalic and atraumatic.  Cardiovascular:     Rate and Rhythm: Regular rhythm. Bradycardia present.  Pulmonary:     Effort: No respiratory distress.     Breath sounds: Decreased air movement present. Examination of the right-upper field reveals decreased breath sounds. Examination of the left-upper field reveals decreased breath sounds.  Examination of the right-middle field reveals decreased breath sounds. Examination of the left-middle field reveals decreased breath sounds. Examination of the right-lower field reveals decreased breath sounds. Examination of the left-lower field reveals decreased breath sounds. Decreased breath sounds present.  Abdominal:     General: Bowel sounds are normal. There is no distension.     Tenderness: There is no abdominal tenderness.  Musculoskeletal:     Right lower leg: No edema.     Left lower leg: No edema.  Skin:    General: Skin is warm and dry.     Capillary Refill: Capillary refill takes less than 2 seconds.  Neurological:     Comments: Unresponsive verbal stimuli.      Labs on Admission: I have personally reviewed following labs and imaging studies  CBC: Recent Labs  Lab 05/03/22 2249  WBC 11.2*  NEUTROABS 8.0*  HGB 12.7  HCT 37.5  MCV 92.8  PLT 143*   Basic Metabolic Panel: Recent Labs  Lab 05/03/22 2249  NA 143  K 3.9  CL 110  CO2 23  GLUCOSE 135*  BUN 22  CREATININE 1.19*  CALCIUM 9.0   GFR: CrCl cannot be calculated (Unknown ideal weight.). Liver Function Tests: Recent Labs  Lab 05/03/22 2249  AST 44*  ALT 20  ALKPHOS 68  BILITOT 0.4  PROT 6.6  ALBUMIN 3.1*   No results for input(s): "LIPASE", "AMYLASE" in the last 168 hours. No results for input(s): "AMMONIA" in the last 168 hours. Coagulation Profile: Recent Labs  Lab 05/03/22 2249  INR 1.3*   Cardiac Enzymes: No results for input(s): "CKTOTAL", "CKMB", "CKMBINDEX", "TROPONINI", "TROPONINIHS" in the last 168 hours. BNP (last 3 results) No results for input(s): "PROBNP" in the last 8760 hours. HbA1C: No results for input(s): "HGBA1C" in the last 72 hours. CBG: No results for input(s): "GLUCAP" in the last 168 hours. Lipid Profile: No results for input(s): "CHOL", "HDL", "LDLCALC", "TRIG", "CHOLHDL", "LDLDIRECT" in the last 72 hours. Thyroid Function Tests: No results for input(s):  "TSH", "T4TOTAL", "FREET4", "T3FREE", "THYROIDAB" in the last 72 hours. Anemia Panel: No results for input(s): "VITAMINB12", "FOLATE", "FERRITIN", "TIBC", "IRON", "RETICCTPCT" in the last 72 hours. Urine analysis:    Component Value Date/Time   COLORURINE AMBER (A) 05/04/2022 0230   APPEARANCEUR HAZY (A) 05/04/2022 0230   LABSPEC 1.029 05/04/2022 0230   PHURINE 5.0 05/04/2022 0230   GLUCOSEU NEGATIVE 05/04/2022  0230   HGBUR NEGATIVE 05/04/2022 0230   BILIRUBINUR NEGATIVE 05/04/2022 0230   KETONESUR NEGATIVE 05/04/2022 0230   PROTEINUR 30 (A) 05/04/2022 0230   NITRITE NEGATIVE 05/04/2022 0230   LEUKOCYTESUR TRACE (A) 05/04/2022 0230    Radiological Exams on Admission: I have personally reviewed images DG Chest Port 1 View  Result Date: 05/03/2022 CLINICAL DATA:  Questionable sepsis - evaluate for abnormality EXAM: PORTABLE CHEST 1 VIEW COMPARISON:  Chest x-ray 05/09/2021 FINDINGS: The heart and mediastinal contours are unchanged. Aortic calcification. Low lung volumes. Developing hazy airspace opacities at the left lower lung base and right mid lung zone. No pulmonary edema. No definite pleural effusion. No pneumothorax. No acute osseous abnormality. IMPRESSION: 1. Low lung volumes. Developing hazy airspace opacities at the left lower lung base and right mid lung zone. Consider repeating PA and lateral view of the chest with improved inspiratory effort. 2.  Aortic Atherosclerosis (ICD10-I70.0). Electronically Signed   By: Iven Finn M.D.   On: 05/03/2022 23:06    EKG: My personal interpretation of EKG shows: NSR     Assessment/Plan Principal Problem:   Acute respiratory failure with hypoxia (HCC) Active Problems:   Pneumonia   Dementia with behavioral disturbance (HCC)   Essential hypertension   Mood disorder with psychosis ([place])   Secondary DM with CKD stage 3 and hypertension (HCC)   Stage 3a chronic kidney disease (CKD) (HCC)   Bedbound    Assessment and Plan: *  Acute respiratory failure with hypoxia ([place]) Observation med/surg bed. Awaiting covid testing. Reportedly SNF has residents with Covid. CXR does not look like covid. Check procalcitonin. Pt given 1 dose of Rocephin/Zithromax. Awaiting respiratory viral panel. Hold on further abx pending viral testing.  Pneumonia See above. Given 1 dose of IV Rocephin/zithromax. Unknown how long ago pt was exposed to coivd. No wheezing. Awaiting covid testing. Hold off on decadron and molnupiravir.  Bedbound Chronic.  Stage 3a chronic kidney disease (CKD) (HCC) Baseline Scr 0.8-1.0. hold lasix.  Secondary DM with CKD stage 3 and hypertension (HCC) Add SSI.  Mood disorder with psychosis ([place]) Chronc.  Essential hypertension Stable.  Dementia with behavioral disturbance ([place]) On multiple meds for mental health.   DVT prophylaxis: SQ Heparin Code Status: Full Code per SNF paperwork Family Communication: no family at bedside  Disposition Plan: return to SNF  Consults called: none  Admission status: Observation, Med-Surg   Kristopher Oppenheim, DO [facility] 05/04/2022, 3:43 AM

## 2022-05-04 NOTE — ED Notes (Signed)
ED TO INPATIENT HANDOFF REPORT  ED Nurse Name and Phone #: Marquez Ceesay RN [phone]  S Name/Age/Gender Kelly [name] 87 y.o. female Room/Bed: 007C/007C  Code Status   Code Status: Full Code  Home/SNF/Other Nursing Home Patient oriented to: self Is this baseline? Yes   Triage Complete: Triage complete  Chief Complaint Acute respiratory failure with hypoxia (HCC) [J96.01]  Triage Note [facility] has a fever and tested positive for Covid. Unknown test date. Ems vitals 90% 2l oxygen, bp 100/74    Allergies No Known Allergies  Level of Care/Admitting Diagnosis ED Disposition     ED Disposition  Admit   Condition  --   Comment  Hospital Area: [facility] [[id number]]  Level of Care: Med-Surg [16]  May place patient in observation at [facility] or [facility] if equivalent level of care is available:: No  Covid Evaluation: Symptomatic Person Under Investigation (PUI) or recent exposure (last 10 days) *Testing Required*  Diagnosis: Acute respiratory failure with hypoxia [facility]) [[id number]]  Admitting Physician: Imogene Burn ERIC [[id number]]  Attending Physician: Imogene Burn, ERIC [[id number]]          B Medical/Surgery History Past Medical History:  Diagnosis Date   Chronic kidney disease    Stage III   Dementia with behavioral disturbance (HCC) 03/19/2015   Notated on FL2 Form from Dr. Dreama Saa [phone]   Depression    previously saw Dr Donell Beers   Diabetes mellitus without complication (HCC)    11/18/15 A1c 6%   Dyslipidemia    11/18/15 Tg 74, HDL 60, LDL 90 on statin   Fracture of radial head, closed 05/12/2021   05/09/2021 elbow films revealed possible subtle transversely oriented fracture left radial head.  Effusion and soft tissue edema noted over the olecranon.   Hypertension    Hypothyroidism    11/18/15 TSH 53.6, 02/07/16 TSH 9.42 @ [facility]   MDD (major depressive disorder)    With behavioral disturbance   Scabies    02/21/16 [facility],[place];  Rx: Permethrin cream   Urinary tract infection due to Proteus 05/04/2019   Uniformly sensitive except to nitrofurantoin; Cipro prescribed   Vitamin D deficiency    11/18/15 vitamin D 25-hydroxy 39   Past Surgical History:  Procedure Laterality Date   no record     04/10/15 [facility] note: see old chart     A IV Location/Drains/Wounds Patient Lines/Drains/Airways Status     Active Line/Drains/Airways     Name Placement date Placement time Site Days   Peripheral IV 05/03/22 20 G Distal;Left;Posterior Forearm 05/03/22  2300  Forearm  1            Intake/Output Last 24 hours No intake or output data in the 24 hours ending 05/04/22 1217  Labs/Imaging Results for orders placed or performed during the hospital encounter of 05/03/22 (from the past 48 hour(s))  Lactic acid, plasma     Status: Abnormal   Collection Time: 05/03/22 10:49 PM  Result Value Ref Range   Lactic Acid, Venous 2.1 (HH) 0.5 - 1.9 mmol/L    Comment: CRITICAL RESULT CALLED TO, READ BACK BY AND VERIFIED WITH Loma Messing, RN, 2335 05/03/22, Mliss Sax Performed at [facility], [address], [place], [place] [address]   Comprehensive metabolic panel     Status: Abnormal   Collection Time: 05/03/22 10:49 PM  Result Value Ref Range   Sodium 143 135 - 145 mmol/L   Potassium 3.9 3.5 - 5.1 mmol/L  Chloride 110 98 - 111 mmol/L   CO2 23 22 - 32 mmol/L   Glucose, Bld 135 (H) 70 - 99 mg/dL    Comment: Glucose reference range applies only to samples taken after fasting for at least 8 hours.   BUN 22 8 - 23 mg/dL   Creatinine, Ser 3.22 (H) 0.44 - 1.00 mg/dL   Calcium 9.0 8.9 - 02.5 mg/dL   Total Protein 6.6 6.5 - 8.1 g/dL   Albumin 3.1 (L) 3.5 - 5.0 g/dL   AST 44 (H) 15 - 41 U/L   ALT 20 0 - 44 U/L   Alkaline Phosphatase 68 38 - 126 U/L   Total Bilirubin 0.4 0.3 - 1.2 mg/dL   GFR, Estimated 45 (L) >60 mL/min    Comment: (NOTE) Calculated using the CKD-EPI Creatinine Equation (2021)    Anion gap 10 5 -  15    Comment: Performed at [facility], [address] [place], [place] [address]  CBC with Differential     Status: Abnormal   Collection Time: 05/03/22 10:49 PM  Result Value Ref Range   WBC 11.2 (H) 4.0 - 10.5 K/uL   RBC 4.04 3.87 - 5.11 MIL/uL   Hemoglobin 12.7 12.0 - 15.0 g/dL   HCT 23.7 62.8 - 31.5 %   MCV 92.8 80.0 - 100.0 fL   MCH 31.4 26.0 - 34.0 pg   MCHC 33.9 30.0 - 36.0 g/dL   RDW 17.6 (H) 16.0 - 73.7 %   Platelets 143 (L) 150 - 400 K/uL   nRBC 0.0 0.0 - 0.2 %   Neutrophils Relative % 72 %   Neutro Abs 8.0 (H) 1.7 - 7.7 K/uL   Lymphocytes Relative 15 %   Lymphs Abs 1.7 0.7 - 4.0 K/uL   Monocytes Relative 13 %   Monocytes Absolute 1.4 (H) 0.1 - 1.0 K/uL   Eosinophils Relative 0 %   Eosinophils Absolute 0.0 0.0 - 0.5 K/uL   Basophils Relative 0 %   Basophils Absolute 0.0 0.0 - 0.1 K/uL   Immature Granulocytes 0 %   Abs Immature Granulocytes 0.04 0.00 - 0.07 K/uL    Comment: Performed at [facility], [address] [place], [place] [address]  Protime-INR     Status: Abnormal   Collection Time: 05/03/22 10:49 PM  Result Value Ref Range   Prothrombin Time 16.0 (H) 11.4 - 15.2 seconds   INR 1.3 (H) 0.8 - 1.2    Comment: (NOTE) INR goal varies based on device and disease states. Performed at [facility], [address], [place], [place] [address]   APTT     Status: None   Collection Time: 05/03/22 10:49 PM  Result Value Ref Range   aPTT 33 24 - 36 seconds    Comment: Performed at [facility], [address] [place], [place] [address]  Blood Culture (routine x 2)     Status: None (Preliminary result)   Collection Time: 05/03/22 10:49 PM   Specimen: BLOOD LEFT WRIST  Result Value Ref Range   Specimen Description BLOOD LEFT WRIST    Special Requests      BOTTLES DRAWN AEROBIC AND ANAEROBIC Blood Culture adequate volume   Culture      NO GROWTH < 12 HOURS Performed at [facility], [address] [place], [place] [address]     Report Status PENDING   Lactic acid, plasma     Status: Abnormal  Collection Time: 05/04/22 12:22 AM  Result Value Ref Range   Lactic Acid, Venous 2.0 (HH) 0.5 - 1.9 mmol/L    Comment: CRITICAL VALUE NOTED.  VALUE IS CONSISTENT WITH PREVIOUSLY REPORTED AND CALLED VALUE. Performed at [facility], [address], [place], [place] [address]   Resp Panel by RT-PCR (Flu A&B, Covid) Anterior Nasal Swab     Status: Abnormal   Collection Time: 05/04/22 12:31 AM   Specimen: Anterior Nasal Swab  Result Value Ref Range   SARS Coronavirus 2 by RT PCR POSITIVE (A) NEGATIVE    Comment: (NOTE) SARS-CoV-2 target nucleic acids are DETECTED.  The SARS-CoV-2 RNA is generally detectable in upper respiratory specimens during the acute phase of infection. Positive results are indicative of the presence of the identified virus, but do not rule out bacterial infection or co-infection with other pathogens not detected by the test. Clinical correlation with patient history and other diagnostic information is necessary to determine patient infection status. The expected result is Negative.  Fact Sheet for Patients: BloggerCourse.com  Fact Sheet for Healthcare Providers: SeriousBroker.it  This test is not yet approved or cleared by the [place] FDA and  has been authorized for detection and/or diagnosis of SARS-CoV-2 by FDA under an Emergency Use Authorization (EUA).  This EUA will remain in effect (meaning this test can be used) for the duration of  the COVID-19 declaration under Section 564(b)(1) of the A ct, 21 U.S.C. section 360bbb-3(b)(1), unless the authorization is terminated or revoked sooner.     Influenza A by PCR NEGATIVE NEGATIVE   Influenza B by PCR NEGATIVE NEGATIVE    Comment: (NOTE) The Xpert Xpress SARS-CoV-2/FLU/RSV plus assay is intended as an aid in the diagnosis of influenza from Nasopharyngeal swab specimens  and should not be used as a sole basis for treatment. Nasal washings and aspirates are unacceptable for Xpert Xpress SARS-CoV-2/FLU/RSV testing.  Fact Sheet for Patients: BloggerCourse.com  Fact Sheet for Healthcare Providers: SeriousBroker.it  This test is not yet approved or cleared by the [place] FDA and has been authorized for detection and/or diagnosis of SARS-CoV-2 by FDA under an Emergency Use Authorization (EUA). This EUA will remain in effect (meaning this test can be used) for the duration of the COVID-19 declaration under Section 564(b)(1) of the Act, 21 U.S.C. section 360bbb-3(b)(1), unless the authorization is terminated or revoked.  Performed at [facility], [address], [place], [place] [address]   Urinalysis, Routine w reflex microscopic     Status: Abnormal   Collection Time: 05/04/22  2:30 AM  Result Value Ref Range   Color, Urine AMBER (A) YELLOW    Comment: BIOCHEMICALS MAY BE AFFECTED BY COLOR   APPearance HAZY (A) CLEAR   Specific Gravity, Urine 1.029 1.005 - 1.030   pH 5.0 5.0 - 8.0   Glucose, UA NEGATIVE NEGATIVE mg/dL   Hgb urine dipstick NEGATIVE NEGATIVE   Bilirubin Urine NEGATIVE NEGATIVE   Ketones, ur NEGATIVE NEGATIVE mg/dL   Protein, ur 30 (A) NEGATIVE mg/dL   Nitrite NEGATIVE NEGATIVE   Leukocytes,Ua TRACE (A) NEGATIVE   RBC / HPF 6-10 0 - 5 RBC/hpf   WBC, UA 11-20 0 - 5 WBC/hpf   Bacteria, UA RARE (A) NONE SEEN   Squamous Epithelial / LPF 0-5 0 - 5    Comment: Performed at [facility], [address], [place], [place] [address]  Respiratory (~20 pathogens) panel by PCR     Status: None  Collection Time: 05/04/22  3:35 AM   Specimen: Nasopharyngeal Swab; Respiratory  Result Value Ref Range   Adenovirus NOT DETECTED NOT DETECTED   Coronavirus 229E NOT DETECTED NOT DETECTED    Comment: (NOTE) The Coronavirus on the Respiratory Panel, DOES NOT test for the novel   Coronavirus (2019 nCoV)    Coronavirus HKU1 NOT DETECTED NOT DETECTED   Coronavirus NL63 NOT DETECTED NOT DETECTED   Coronavirus OC43 NOT DETECTED NOT DETECTED   Metapneumovirus NOT DETECTED NOT DETECTED   Rhinovirus / Enterovirus NOT DETECTED NOT DETECTED   Influenza A NOT DETECTED NOT DETECTED   Influenza B NOT DETECTED NOT DETECTED   Parainfluenza Virus 1 NOT DETECTED NOT DETECTED   Parainfluenza Virus 2 NOT DETECTED NOT DETECTED   Parainfluenza Virus 3 NOT DETECTED NOT DETECTED   Parainfluenza Virus 4 NOT DETECTED NOT DETECTED   Respiratory Syncytial Virus NOT DETECTED NOT DETECTED   Bordetella pertussis NOT DETECTED NOT DETECTED   Bordetella Parapertussis NOT DETECTED NOT DETECTED   Chlamydophila pneumoniae NOT DETECTED NOT DETECTED   Mycoplasma pneumoniae NOT DETECTED NOT DETECTED    Comment: Performed at [facility], [address] [place], [place] [address]  Hemoglobin A1c     Status: Abnormal   Collection Time: 05/04/22  5:50 AM  Result Value Ref Range   Hgb A1c MFr Bld 5.8 (H) 4.8 - 5.6 %    Comment: (NOTE) Pre diabetes:          5.7%-6.4%  Diabetes:              >6.4%  Glycemic control for   <7.0% adults with diabetes    Mean Plasma Glucose 119.76 mg/dL    Comment: Performed at [facility], [address] [place], [place] [address]  Procalcitonin - Baseline     Status: None   Collection Time: 05/04/22  5:50 AM  Result Value Ref Range   Procalcitonin 0.64 ng/mL    Comment:        Interpretation: PCT > 0.5 ng/mL and <= 2 ng/mL: Systemic infection (sepsis) is possible, but other conditions are known to elevate PCT as well. (NOTE)       Sepsis PCT Algorithm           Lower Respiratory Tract                                      Infection PCT Algorithm    ----------------------------     ----------------------------         PCT < 0.25 ng/mL                PCT < 0.10 ng/mL          Strongly encourage             Strongly discourage    discontinuation of antibiotics    initiation of antibiotics    ----------------------------     -----------------------------       PCT 0.25 - 0.50 ng/mL            PCT 0.10 - 0.25 ng/mL               OR       >80% decrease in PCT            Discourage initiation of  antibiotics      Encourage discontinuation           of antibiotics    ----------------------------     -----------------------------         PCT >= 0.50 ng/mL              PCT 0.26 - 0.50 ng/mL                AND       <80% decrease in PCT             Encourage initiation of                                             antibiotics       Encourage continuation           of antibiotics    ----------------------------     -----------------------------        PCT >= 0.50 ng/mL                  PCT > 0.50 ng/mL               AND         increase in PCT                  Strongly encourage                                      initiation of antibiotics    Strongly encourage escalation           of antibiotics                                     -----------------------------                                           PCT <= 0.25 ng/mL                                                 OR                                        > 80% decrease in PCT                                      Discontinue / Do not initiate                                             antibiotics  Performed at [facility], [place] [address], [place], [place] [address]   C-reactive protein     Status: Abnormal   Collection Time:  05/04/22  6:42 AM  Result Value Ref Range   CRP 7.7 (H) <1.0 mg/dL    Comment: Performed at [facility], [address] [place], [place] [address]  D-dimer, quantitative     Status: Abnormal   Collection Time: 05/04/22  6:42 AM  Result Value Ref Range   D-Dimer, Quant 1.29 (H) 0.00 - 0.50 ug/mL-FEU    Comment: (NOTE) At the manufacturer cut-off value of 0.5 g/mL FEU, this  assay has a negative predictive value of 95-100%.This assay is intended for use in conjunction with a clinical pretest probability (PTP) assessment model to exclude pulmonary embolism (PE) and deep venous thrombosis (DVT) in outpatients suspected of PE or DVT. Results should be correlated with clinical presentation. Performed at [facility], [address] [place], [place] [address]   Ferritin     Status: None   Collection Time: 05/04/22  6:42 AM  Result Value Ref Range   Ferritin 45 11 - 307 ng/mL    Comment: Performed at [facility], [address], [place], [place] [address]  TSH     Status: None   Collection Time: 05/04/22  6:42 AM  Result Value Ref Range   TSH 0.607 0.350 - 4.500 uIU/mL    Comment: Performed by a 3rd Generation assay with a functional sensitivity of <=0.01 uIU/mL. Performed at [facility], [address], [place], [place] [address]   CBG monitoring, ED     Status: None   Collection Time: 05/04/22  8:02 AM  Result Value Ref Range   Glucose-Capillary 94 70 - 99 mg/dL    Comment: Glucose reference range applies only to samples taken after fasting for at least 8 hours.   DG Chest Port 1 View  Result Date: 05/03/2022 CLINICAL DATA:  Questionable sepsis - evaluate for abnormality EXAM: PORTABLE CHEST 1 VIEW COMPARISON:  Chest x-ray 05/09/2021 FINDINGS: The heart and mediastinal contours are unchanged. Aortic calcification. Low lung volumes. Developing hazy airspace opacities at the left lower lung base and right mid lung zone. No pulmonary edema. No definite pleural effusion. No pneumothorax. No acute osseous abnormality. IMPRESSION: 1. Low lung volumes. Developing hazy airspace opacities at the left lower lung base and right mid lung zone. Consider repeating PA and lateral view of the chest with improved inspiratory effort. 2.  Aortic Atherosclerosis (ICD10-I70.0). Electronically Signed   By: Tish Frederickson M.D.   On: 05/03/2022 23:06    Pending  Labs Unresulted Labs (From admission, onward)     Start     Ordered   05/05/22 0500  CBC  Daily at 5am,   R      05/04/22 0627   05/05/22 0500  Comprehensive metabolic panel  Daily at 5am,   R      05/04/22 0627   05/04/22 0628  C-reactive protein  Daily at 5am,   R      05/04/22 0627   05/04/22 0628  D-dimer, quantitative  Daily at 5am,   R      05/04/22 0627   05/03/22 2249  Blood Culture (routine x 2)  (Undifferentiated presentation (screening labs and basic nursing orders))  BLOOD CULTURE X 2,   STAT      05/03/22 2249   05/03/22 2249  Urine Culture  (Undifferentiated presentation (screening labs and basic nursing orders))  ONCE - URGENT,   URGENT       Question:  Indication  Answer:  Sepsis   05/03/22 2249  Vitals/Pain Today's Vitals   05/04/22 0830 05/04/22 1021 05/04/22 1100 05/04/22 1115  BP: (!) 133/59  (!) 116/53   Pulse: (!) 56  (!) 52 (!) 53  Resp: Temp:  (!) 97.4 F (36.3 C)    TempSrc:  Oral    SpO2: 100%  100% 97%  PainSc:        Isolation Precautions Airborne and Contact precautions  Medications Medications  acetaminophen (TYLENOL) 160 MG/5ML solution 500 mg (500 mg Oral Not Given 05/04/22 0026)  insulin aspart (novoLOG) injection 0-9 Units ( Subcutaneous Not Given 05/04/22 0832)  insulin aspart (novoLOG) injection 0-5 Units (has no administration in time range)  aspirin EC tablet 81 mg (81 mg Oral Given 05/04/22 1024)  atorvastatin (LIPITOR) tablet 40 mg (has no administration in time range)  donepezil (ARICEPT) tablet 10 mg (has no administration in time range)  escitalopram (LEXAPRO) tablet 10 mg (10 mg Oral Given 05/04/22 1024)  memantine (NAMENDA) tablet 10 mg (10 mg Oral Given 05/04/22 1024)  levothyroxine (SYNTHROID) tablet 175 mcg (175 mcg Oral Not Given 05/04/22 0734)  divalproex (DEPAKOTE SPRINKLE) capsule 125 mg (125 mg Oral Given 05/04/22 0627)  heparin injection 5,000 Units (5,000 Units Subcutaneous Given 05/04/22  0627)  acetaminophen (TYLENOL) tablet 650 mg (has no administration in time range)    Or  acetaminophen (TYLENOL) suppository 650 mg (has no administration in time range)  ondansetron (ZOFRAN) tablet 4 mg (has no administration in time range)    Or  ondansetron (ZOFRAN) injection 4 mg (has no administration in time range)  albuterol (PROVENTIL) (2.5 MG/3ML) 0.083% nebulizer solution 2.5 mg (has no administration in time range)  methylPREDNISolone sodium succinate (SOLU-MEDROL) 40 mg/mL injection 40 mg (40 mg Intravenous Given 05/04/22 1021)  molnupiravir EUA (LAGEVRIO) capsule 800 mg (800 mg Oral Given 05/04/22 1025)  valbenazine (INGREZZA) capsule 80 mg (has no administration in time range)  QUEtiapine (SEROQUEL) tablet 50 mg (has no administration in time range)  sodium chloride 0.9 % bolus 1,000 mL (0 mLs Intravenous Stopped 05/04/22 0248)  cefTRIAXone (ROCEPHIN) 1 g in sodium chloride 0.9 % 100 mL IVPB (0 g Intravenous Stopped 05/04/22 0140)  azithromycin (ZITHROMAX) 500 mg in sodium chloride 0.9 % 250 mL IVPB (0 mg Intravenous Stopped 05/04/22 0242)  acetaminophen (TYLENOL) suppository 650 mg (650 mg Rectal Given 05/04/22 0101)  sodium chloride 0.9 % bolus 1,000 mL (0 mLs Intravenous Stopped 05/04/22 0410)    Mobility non-ambulatory Moderate fall risk   Focused Assessments Cardiac Assessment Handoff:  Cardiac Rhythm: Sinus bradycardia No results found for: "CKTOTAL", "CKMB", "CKMBINDEX", "TROPONINI" Lab Results  Component Value Date   DDIMER 1.29 (H) 05/04/2022   Does the Patient currently have chest pain? No   , Neuro Assessment Handoff:  Swallow screen pass? No  Cardiac Rhythm: Sinus bradycardia       Neuro Assessment: Exceptions to WDL Neuro Checks:      Last Documented NIHSS Modified Score:   Has TPA been given?  If patient is a Neuro Trauma and patient is going to OR before floor call report to 4N Charge nurse: [phone] or [phone]  , Pulmonary  Assessment Handoff:  Lung sounds: Bilateral Breath Sounds: Diminished O2 Device: Nasal Cannula O2 Flow Rate (L/min): 2 L/min (not baseline)    R Recommendations: See Admitting Provider Note  Report given to:   Additional Notes:

## 2022-05-04 NOTE — Assessment & Plan Note (Signed)
Chronc.

## 2022-05-04 NOTE — Assessment & Plan Note (Signed)
Stable

## 2022-05-05 DIAGNOSIS — F03918 Unspecified dementia, unspecified severity, with other behavioral disturbance: Secondary | ICD-10-CM | POA: Diagnosis not present

## 2022-05-05 DIAGNOSIS — E1322 Other specified diabetes mellitus with diabetic chronic kidney disease: Secondary | ICD-10-CM | POA: Diagnosis not present

## 2022-05-05 DIAGNOSIS — J9601 Acute respiratory failure with hypoxia: Secondary | ICD-10-CM | POA: Diagnosis not present

## 2022-05-05 DIAGNOSIS — J189 Pneumonia, unspecified organism: Secondary | ICD-10-CM | POA: Diagnosis not present

## 2022-05-05 LAB — COMPREHENSIVE METABOLIC PANEL WITH GFR
ALT: 21 U/L (ref 0–44)
AST: 47 U/L — ABNORMAL HIGH (ref 15–41)
Albumin: 2.8 g/dL — ABNORMAL LOW (ref 3.5–5.0)
Alkaline Phosphatase: 50 U/L (ref 38–126)
Anion gap: 9 (ref 5–15)
BUN: 23 mg/dL (ref 8–23)
CO2: 25 mmol/L (ref 22–32)
Calcium: 9.1 mg/dL (ref 8.9–10.3)
Chloride: 111 mmol/L (ref 98–111)
Creatinine, Ser: 0.91 mg/dL (ref 0.44–1.00)
GFR, Estimated: >60 mL/min
Glucose, Bld: 100 mg/dL — ABNORMAL HIGH (ref 70–99)
Potassium: 3.7 mmol/L (ref 3.5–5.1)
Sodium: 145 mmol/L (ref 135–145)
Total Bilirubin: 0.3 mg/dL (ref 0.3–1.2)
Total Protein: 6.2 g/dL — ABNORMAL LOW (ref 6.5–8.1)

## 2022-05-05 LAB — CBC
HCT: 34 % — ABNORMAL LOW (ref 36.0–46.0)
Hemoglobin: 11.2 g/dL — ABNORMAL LOW (ref 12.0–15.0)
MCH: 31.2 pg (ref 26.0–34.0)
MCHC: 32.9 g/dL (ref 30.0–36.0)
MCV: 94.7 fL (ref 80.0–100.0)
Platelets: 130 K/uL — ABNORMAL LOW (ref 150–400)
RBC: 3.59 MIL/uL — ABNORMAL LOW (ref 3.87–5.11)
RDW: 15.7 % — ABNORMAL HIGH (ref 11.5–15.5)
WBC: 8.8 K/uL (ref 4.0–10.5)
nRBC: 0 % (ref 0.0–0.2)

## 2022-05-05 LAB — GLUCOSE, CAPILLARY
Glucose-Capillary: 105 mg/dL — ABNORMAL HIGH (ref 70–99)
Glucose-Capillary: 80 mg/dL (ref 70–99)
Glucose-Capillary: 117 mg/dL — ABNORMAL HIGH (ref 70–99)
Glucose-Capillary: 84 mg/dL (ref 70–99)

## 2022-05-05 LAB — D-DIMER, QUANTITATIVE: D-Dimer, Quant: 0.89 ug{FEU}/mL — ABNORMAL HIGH (ref 0.00–0.50)

## 2022-05-05 LAB — C-REACTIVE PROTEIN: CRP: 6.8 mg/dL — ABNORMAL HIGH (ref <1.0)

## 2022-05-05 MED ORDER — PREDNISONE 20 MG PO TABS
40.0000 mg | ORAL_TABLET | Freq: Every day | ORAL | Status: DC
  Administered 2022-05-06 – 2022-05-09 (×4): 40 mg via ORAL
  Filled 2022-05-05 (×4): qty 2

## 2022-05-05 NOTE — Progress Notes (Signed)
Patient pulled IV out. Refused for another one. MD notified.

## 2022-05-05 NOTE — Progress Notes (Signed)
Patient has arrived to the floor  

## 2022-05-05 NOTE — Progress Notes (Signed)
[facility]                                                                              Kelly [name], is a 87 y.o. female, DOB - 05-15-1935, [id number] Admit date - 05/03/2022    Outpatient Primary MD for the patient is Pcp, No  LOS - 0  days  Chief Complaint  Patient presents with   Shortness of Breath       Brief summary   Patient is a 87 year old female, bedbound status, dementia with behavioral disturbance, mood disorder with psychosis, diabetes mellitus type 2, CKD stage IIIa, HTN presented to ED from SNF with hypoxia.  Apparently there was an outbreak of COVID at the facility.  Patient was sent to the ER for evaluation.  She was on 2 L O2 with sats 98% when EMS arrived.  She also reportedly had a fever and had tested positive for COVID. On arrival, temp 101.4 F, heart rate 87, BP 106/55.  WBC count 11.2, hemoglobin 12.7, platelets 143, lactic acid mildly elevated 2.1 Chest x-ray showed bibasilar airspace opacities, creatinine 1.19, sodium 143.   Assessment & Plan    Principal Problem:   Acute respiratory failure with hypoxia (HCC) secondary to pneumonia due to COVID-19 Severe sepsis secondary to COVID-19 -COVID-19 positive, patient met sepsis criteria on admission with fevers, mild tachycardia, hypotension, lactic acidosis -Received IV fluid hydration, IV antibiotics, procalcitonin 0.64, received 1 dose of IV Zithromax, Rocephin in ED -COVID +, continue molnupiravir, day #2,  -Continue IV Solu-Medrol    Active Problems:   Dementia with behavioral disturbance (HCC) -Continue Depakote, Aricept, Lexapro, Seroquel, Ingrezza -Avoid sedating meds -Alert and awake, pleasant however has dementia    Essential hypertension -BP fairly stable    Secondary DM with CKD stage 3 and hypertension (HCC) -CBGs stable, closely monitor as patient is on steroids -Hemoglobin A1c 5.8  Mild AKI on stage 3a chronic kidney disease (CKD) (HCC) -Baseline creatinine  0.8 -Creatinine 1.1 on admission, with lactic acidosis -Received 2 L IV fluid bolus, creatinine improved to baseline 0.9    Bedbound -Appears to be at baseline  Code Status: Full code DVT Prophylaxis:  heparin injection 5,000 Units Start: 05/04/22 0600 SCDs Start: 05/04/22 0426   Level of Care: Level of care: Med-Surg Family Communication: Updated patient's daughter, April Cox on phone on 11/17 Disposition Plan:      Remains inpatient appropriate: Started on COVID treatment   Procedures:  None  Consultants:   None  Antimicrobials:   Anti-infectives (From admission, onward)    Start     Dose/Rate Route Frequency Ordered Stop   05/04/22 1000  nirmatrelvir/ritonavir EUA (PAXLOVID) 3 tablet  Status:  Discontinued        3 tablet Oral 2 times daily 05/04/22 0625 05/04/22 0704   05/04/22 0800  molnupiravir EUA (LAGEVRIO) capsule 800 mg        4 capsule Oral 2 times daily 05/04/22 0705 05/09/22 0959   05/04/22 0030  cefTRIAXone (ROCEPHIN) 1 g in sodium chloride 0.9 % 100 mL IVPB        1 g 200 mL/hr over 30 Minutes Intravenous  Once 05/04/22 0023 05/04/22 0140   05/04/22 0030  azithromycin (ZITHROMAX) 500 mg in sodium chloride 0.9 % 250 mL IVPB        500 mg 250 mL/hr over 60 Minutes Intravenous  Once 05/04/22 0023 05/04/22 0242          Medications  acetaminophen (TYLENOL) oral liquid 160 mg/5 mL  500 mg Oral Once   aspirin EC  81 mg Oral Daily   atorvastatin  40 mg Oral QHS   divalproex  125 mg Oral Q8H   donepezil  10 mg Oral QHS   escitalopram  10 mg Oral Daily   heparin  5,000 Units Subcutaneous Q8H   insulin aspart  0-5 Units Subcutaneous QHS   insulin aspart  0-9 Units Subcutaneous TID WC   levothyroxine  175 mcg Oral QAC breakfast   memantine  10 mg Oral BID   methylPREDNISolone (SOLU-MEDROL) injection  40 mg Intravenous Daily   molnupiravir EUA  4 capsule Oral BID   [START ON 05/05/2022] QUEtiapine  50 mg Oral BID   [START ON 05/05/2022] valbenazine  80  mg Oral Daily      Subjective:   Kelly [name] was seen and examined today.  Alert, pleasant, talking about lemonade.  She is confused, has baseline dementia.  Appears close to her baseline.  No varus or chills or hypoxia.  No acute issues overnight.  Objective:   Vitals:   05/04/22 0630 05/04/22 0800 05/04/22 0830 05/04/22 1021  BP: (!) 107/95 122/64 (!) 133/59   Pulse: (!) 53 (!) 54 (!) 56   Resp: _0 Temp:    (!) 97.4 F (36.3 C)  TempSrc:    Oral  SpO2: 99% 99% 100%    No intake or output data in the 24 hours ending 05/04/22 1039   Wt Readings from Last 3 Encounters:  11/07/21 75.7 kg  10/24/21 75.7 kg  10/20/21 75.7 kg    Physical Exam General: Alert, awake, has dementia, NAD Cardiovascular: S1 S2 clear, RRR.  Respiratory: CTAB, no wheezing Gastrointestinal: Soft, nontender, nondistended, NBS Ext: no pedal edema bilaterally Neuro: no new deficits Psych: dementia    Data Reviewed:  I have personally reviewed following labs    CBC Lab Results  Component Value Date   WBC 11.2 (H) 05/03/2022   RBC 4.04 05/03/2022   HGB 12.7 05/03/2022   HCT 37.5 05/03/2022   MCV 92.8 05/03/2022   MCH 31.4 05/03/2022   PLT 143 (L) 05/03/2022   MCHC 33.9 05/03/2022   RDW 15.6 (H) 05/03/2022   LYMPHSABS 1.7 05/03/2022   MONOABS 1.4 (H) 05/03/2022   EOSABS 0.0 05/03/2022   BASOSABS 0.0 11/91/4782     Last metabolic panel Lab Results  Component Value Date   NA 143 05/03/2022   K 3.9 05/03/2022   CL 110 05/03/2022   CO2 23 05/03/2022   BUN 22 05/03/2022   CREATININE 1.19 (H) 05/03/2022   GLUCOSE 135 (H) 05/03/2022   GFRNONAA 45 (L) 05/03/2022   GFRAA 80.74 07/13/2021   CALCIUM 9.0 05/03/2022   PROT 6.6 05/03/2022   ALBUMIN 3.1 (L) 05/03/2022   BILITOT 0.4 05/03/2022   ALKPHOS 68 05/03/2022   AST 44 (H) 05/03/2022   ALT 20 05/03/2022   ANIONGAP 10 05/03/2022    CBG (last 3)  Recent Labs    05/04/22 0802  GLUCAP 94      Coagulation  Profile: Recent Labs  Lab 05/03/22 2249  INR 1.3*  Radiology Studies: I have personally reviewed the imaging studies  DG Chest Port 1 View  Result Date: 05/03/2022 CLINICAL DATA:  Questionable sepsis - evaluate for abnormality EXAM: PORTABLE CHEST 1 VIEW COMPARISON:  Chest x-ray 05/09/2021 FINDINGS: The heart and mediastinal contours are unchanged. Aortic calcification. Low lung volumes. Developing hazy airspace opacities at the left lower lung base and right mid lung zone. No pulmonary edema. No definite pleural effusion. No pneumothorax. No acute osseous abnormality. IMPRESSION: 1. Low lung volumes. Developing hazy airspace opacities at the left lower lung base and right mid lung zone. Consider repeating PA and lateral view of the chest with improved inspiratory effort. 2.  Aortic Atherosclerosis (ICD10-I70.0). Electronically Signed   By: Iven Finn M.D.   On: 05/03/2022 23:06       Anaih Brander M.D. [facility] 05/04/2022, 10:39 AM  Available via Epic secure chat 7am-7pm After 7 pm, please refer to night coverage provider listed on amion.

## 2022-05-06 DIAGNOSIS — J9601 Acute respiratory failure with hypoxia: Secondary | ICD-10-CM | POA: Diagnosis not present

## 2022-05-06 DIAGNOSIS — J189 Pneumonia, unspecified organism: Secondary | ICD-10-CM | POA: Diagnosis not present

## 2022-05-06 DIAGNOSIS — F03918 Unspecified dementia, unspecified severity, with other behavioral disturbance: Secondary | ICD-10-CM | POA: Diagnosis not present

## 2022-05-06 DIAGNOSIS — Z7401 Bed confinement status: Secondary | ICD-10-CM | POA: Diagnosis not present

## 2022-05-06 LAB — CBC
HCT: 36 % (ref 36.0–46.0)
Hemoglobin: 11.9 g/dL — ABNORMAL LOW (ref 12.0–15.0)
MCH: 30.8 pg (ref 26.0–34.0)
MCHC: 33.1 g/dL (ref 30.0–36.0)
MCV: 93.3 fL (ref 80.0–100.0)
Platelets: 130 10*3/uL — ABNORMAL LOW (ref 150–400)
RBC: 3.86 MIL/uL — ABNORMAL LOW (ref 3.87–5.11)
RDW: 15.5 % (ref 11.5–15.5)
WBC: 5.1 10*3/uL (ref 4.0–10.5)
nRBC: 0 % (ref 0.0–0.2)

## 2022-05-06 LAB — COMPREHENSIVE METABOLIC PANEL
ALT: 21 U/L (ref 0–44)
AST: 48 U/L — ABNORMAL HIGH (ref 15–41)
Albumin: 2.9 g/dL — ABNORMAL LOW (ref 3.5–5.0)
Alkaline Phosphatase: 54 U/L (ref 38–126)
Anion gap: 8 (ref 5–15)
BUN: 19 mg/dL (ref 8–23)
CO2: 29 mmol/L (ref 22–32)
Calcium: 9 mg/dL (ref 8.9–10.3)
Chloride: 107 mmol/L (ref 98–111)
Creatinine, Ser: 0.84 mg/dL (ref 0.44–1.00)
GFR, Estimated: >60 mL/min (ref >60)
Glucose, Bld: 83 mg/dL (ref 70–99)
Potassium: 3.6 mmol/L (ref 3.5–5.1)
Sodium: 144 mmol/L (ref 135–145)
Total Bilirubin: 0.6 mg/dL (ref 0.3–1.2)
Total Protein: 6.5 g/dL (ref 6.5–8.1)

## 2022-05-06 LAB — GLUCOSE, CAPILLARY
Glucose-Capillary: 72 mg/dL (ref 70–99)
Glucose-Capillary: 93 mg/dL (ref 70–99)
Glucose-Capillary: 126 mg/dL — ABNORMAL HIGH (ref 70–99)
Glucose-Capillary: 119 mg/dL — ABNORMAL HIGH (ref 70–99)
Glucose-Capillary: 113 mg/dL — ABNORMAL HIGH (ref 70–99)

## 2022-05-06 LAB — C-REACTIVE PROTEIN: CRP: 3.6 mg/dL — ABNORMAL HIGH (ref <1.0)

## 2022-05-06 LAB — D-DIMER, QUANTITATIVE: D-Dimer, Quant: 0.76 ug/mL-FEU — ABNORMAL HIGH (ref 0.00–0.50)

## 2022-05-06 NOTE — Progress Notes (Signed)
[facility]                                                                              Kelly [name], is a 87 y.o. female, DOB - 1935/02/15, WUJ:[id number] Admit date - 05/03/2022    Outpatient Primary MD for the patient is Pcp, No  LOS - 2  days  Chief Complaint  Patient presents with   Shortness of Breath       Brief summary   Patient is a 87 year old female, bedbound status, dementia with behavioral disturbance, mood disorder with psychosis, diabetes mellitus type 2, CKD stage IIIa, HTN presented to ED from SNF with hypoxia.  Apparently there was an outbreak of COVID at the facility.  Patient was sent to the ER for evaluation.  She was on 2 L O2 with sats 98% when EMS arrived.  She also reportedly had a fever and had tested positive for COVID. On arrival, temp 101.4 F, heart rate 87, BP 106/55.  WBC count 11.2, hemoglobin 12.7, platelets 143, lactic acid mildly elevated 2.1 Chest x-ray showed bibasilar airspace opacities, creatinine 1.19, sodium 143.   Assessment & Plan    Principal Problem:   Acute respiratory failure with hypoxia (HCC) secondary to pneumonia due to COVID-19 Severe sepsis secondary to COVID-19 -COVID-19 positive, patient met sepsis criteria on admission with fevers, mild tachycardia, hypotension, lactic acidosis -Received IV fluid hydration, IV antibiotics, procalcitonin 0.64, received 1 dose of IV Zithromax, Rocephin in ED -COVID +, continue molnupiravir, day #3  -Patient pulled out the IV lines, changed to p.o. prednisone   Active Problems:   Dementia with behavioral disturbance (HCC) -Continue Depakote, Seroquel, Ingrezza -Holding Lexapro, Aricept due to bradycardia -Avoid sedating meds, appears close to her baseline mental status    Essential hypertension -BP fairly stable    Secondary DM with CKD stage 3 and hypertension (HCC) -CBGs stable, closely monitor as patient is on steroids -Hemoglobin A1c 5.8  Mild AKI on stage 3a  chronic kidney disease (CKD) (HCC) -Baseline creatinine 0.8 -Creatinine 1.1 on admission, with lactic acidosis -Received 2 L IV fluid bolus, creatinine improved to baseline 0.9    Bedbound -Appears to be at baseline  Sinus bradycardia -Heart rate is in 40s at rest/sleeping, will hold Aricept, Lexapro -EKG on admission had shown sinus rhythm with prolonged QTc 575 -Placed on telemetry  Code Status: Full code DVT Prophylaxis:  heparin injection 5,000 Units Start: 05/04/22 0600 SCDs Start: 05/04/22 0426   Level of Care: Level of care: Med-Surg Family Communication: Updated patient's daughter, April Cox on phone on 11/17 Disposition Plan:      Remains inpatient appropriate: Started on COVID treatment   Procedures:  None  Consultants:   None  Antimicrobials:   Anti-infectives (From admission, onward)    Start     Dose/Rate Route Frequency Ordered Stop   05/04/22 1000  nirmatrelvir/ritonavir EUA (PAXLOVID) 3 tablet  Status:  Discontinued        3 tablet Oral 2 times daily 05/04/22 0625 05/04/22 0704   05/04/22 0800  molnupiravir EUA (LAGEVRIO) capsule 800 mg        4 capsule Oral 2  times daily 05/04/22 0705 05/09/22 0959   05/04/22 0030  cefTRIAXone (ROCEPHIN) 1 g in sodium chloride 0.9 % 100 mL IVPB        1 g 200 mL/hr over 30 Minutes Intravenous  Once 05/04/22 0023 05/04/22 0140   05/04/22 0030  azithromycin (ZITHROMAX) 500 mg in sodium chloride 0.9 % 250 mL IVPB        500 mg 250 mL/hr over 60 Minutes Intravenous  Once 05/04/22 0023 05/04/22 0242          Medications  acetaminophen (TYLENOL) oral liquid 160 mg/5 mL  500 mg Oral Once   aspirin EC  81 mg Oral Daily   atorvastatin  40 mg Oral QHS   divalproex  125 mg Oral Q8H   donepezil  10 mg Oral QHS   escitalopram  10 mg Oral Daily   heparin  5,000 Units Subcutaneous Q8H   insulin aspart  0-5 Units Subcutaneous QHS   insulin aspart  0-9 Units Subcutaneous TID WC   levothyroxine  175 mcg Oral QAC breakfast    memantine  10 mg Oral BID   molnupiravir EUA  4 capsule Oral BID   predniSONE  40 mg Oral Q breakfast   QUEtiapine  50 mg Oral BID   valbenazine  80 mg Oral Daily      Subjective:   [name] was seen and examined today.  Alert and pleasant, no acute complaints, appears comfortable.  Has advanced dementia.  Pulled out her IVs yesterday.  Bradycardia with heart rate in 40s, no dizziness  Objective:   Vitals:   05/05/22 1952 05/06/22 0500 05/06/22 0612 05/06/22 0830  BP: (!) 154/124  (!) 145/61 (!) 145/59  Pulse: (!) 44  (!) 42 (!) 46  Resp: _0 Temp: 98.7 F (37.1 C)  (!) 97.5 F (36.4 C) 98.3 F (36.8 C)  TempSrc:   Oral Axillary  SpO2: 98%  97% 94%  Weight:  69.5 kg      Intake/Output Summary (Last 24 hours) at 05/06/2022 1212 Last data filed at 05/06/2022 0935 Gross per 24 hour  Intake 630 ml  Output --  Net 630 ml     Wt Readings from Last 3 Encounters:  05/06/22 69.5 kg  11/07/21 75.7 kg  10/24/21 75.7 kg   Physical Exam General: Alert and oriented x 3, NAD Cardiovascular: S1 S2 clear, RRR.  Sinus bradycardia Respiratory: CTAB, no wheezing Gastrointestinal: Soft, nontender, nondistended, NBS Ext: no pedal edema bilaterally Neuro: moving all 4 extremities spontaneously Psych: dementia   Data Reviewed:  I have personally reviewed following labs    CBC Lab Results  Component Value Date   WBC 5.1 05/06/2022   RBC 3.86 (L) 05/06/2022   HGB 11.9 (L) 05/06/2022   HCT 36.0 05/06/2022   MCV 93.3 05/06/2022   MCH 30.8 05/06/2022   PLT 130 (L) 05/06/2022   MCHC 33.1 05/06/2022   RDW 15.5 05/06/2022   LYMPHSABS 1.7 05/03/2022   MONOABS 1.4 (H) 05/03/2022   EOSABS 0.0 05/03/2022   BASOSABS 0.0 13/14/3888     Last metabolic panel Lab Results  Component Value Date   NA 144 05/06/2022   K 3.6 05/06/2022   CL 107 05/06/2022   CO2 29 05/06/2022   BUN 19 05/06/2022   CREATININE 0.84 05/06/2022   GLUCOSE 83 05/06/2022   GFRNONAA >60  05/06/2022   GFRAA 80.74 07/13/2021   CALCIUM 9.0 05/06/2022   PROT 6.5 05/06/2022   ALBUMIN 2.9 (  L) 05/06/2022   BILITOT 0.6 05/06/2022   ALKPHOS 54 05/06/2022   AST 48 (H) 05/06/2022   ALT 21 05/06/2022   ANIONGAP 8 05/06/2022    CBG (last 3)  Recent Labs    05/05/22 2000 05/06/22 0630 05/06/22 0822  GLUCAP 84 72 93      Coagulation Profile: Recent Labs  Lab 05/03/22 2249  INR 1.3*     Radiology Studies: I have personally reviewed the imaging studies  No results found.     Estill Cotta M.D. [facility] 05/06/2022, 12:12 PM  Available via Epic secure chat 7am-7pm After 7 pm, please refer to night coverage provider listed on amion.

## 2022-05-06 NOTE — Progress Notes (Signed)
PIV consult: No current IV medications ordered. Per night shift report, pt also refused insertion. Will await IV medication order/ need before reinserting to preserve pt's vessels.

## 2022-05-06 NOTE — Progress Notes (Signed)
Hey good morning patients noted to be bradycardic with hr of 42 -44bpm on rest asymptomayic other vitals signs stable. Dr. Isidoro Donning informed about same no further interventions yet.

## 2022-05-07 DIAGNOSIS — Z7401 Bed confinement status: Secondary | ICD-10-CM | POA: Diagnosis not present

## 2022-05-07 DIAGNOSIS — J189 Pneumonia, unspecified organism: Secondary | ICD-10-CM | POA: Diagnosis not present

## 2022-05-07 DIAGNOSIS — J9601 Acute respiratory failure with hypoxia: Secondary | ICD-10-CM | POA: Diagnosis not present

## 2022-05-07 DIAGNOSIS — F03918 Unspecified dementia, unspecified severity, with other behavioral disturbance: Secondary | ICD-10-CM | POA: Diagnosis not present

## 2022-05-07 LAB — COMPREHENSIVE METABOLIC PANEL
ALT: 19 U/L (ref 0–44)
AST: 39 U/L (ref 15–41)
Albumin: 2.8 g/dL — ABNORMAL LOW (ref 3.5–5.0)
Alkaline Phosphatase: 52 U/L (ref 38–126)
Anion gap: 8 (ref 5–15)
BUN: 15 mg/dL (ref 8–23)
CO2: 28 mmol/L (ref 22–32)
Calcium: 9.1 mg/dL (ref 8.9–10.3)
Chloride: 107 mmol/L (ref 98–111)
Creatinine, Ser: 1.03 mg/dL — ABNORMAL HIGH (ref 0.44–1.00)
GFR, Estimated: 53 mL/min — ABNORMAL LOW (ref >60)
Glucose, Bld: 97 mg/dL (ref 70–99)
Potassium: 4.3 mmol/L (ref 3.5–5.1)
Sodium: 143 mmol/L (ref 135–145)
Total Bilirubin: 0.4 mg/dL (ref 0.3–1.2)
Total Protein: 6.3 g/dL — ABNORMAL LOW (ref 6.5–8.1)

## 2022-05-07 LAB — CBC
HCT: 36.2 % (ref 36.0–46.0)
Hemoglobin: 11.8 g/dL — ABNORMAL LOW (ref 12.0–15.0)
MCH: 30.3 pg (ref 26.0–34.0)
MCHC: 32.6 g/dL (ref 30.0–36.0)
MCV: 92.8 fL (ref 80.0–100.0)
Platelets: 142 10*3/uL — ABNORMAL LOW (ref 150–400)
RBC: 3.9 MIL/uL (ref 3.87–5.11)
RDW: 15 % (ref 11.5–15.5)
WBC: 5 10*3/uL (ref 4.0–10.5)
nRBC: 0 % (ref 0.0–0.2)

## 2022-05-07 LAB — GLUCOSE, CAPILLARY
Glucose-Capillary: 106 mg/dL — ABNORMAL HIGH (ref 70–99)
Glucose-Capillary: 130 mg/dL — ABNORMAL HIGH (ref 70–99)
Glucose-Capillary: 150 mg/dL — ABNORMAL HIGH (ref 70–99)
Glucose-Capillary: 104 mg/dL — ABNORMAL HIGH (ref 70–99)

## 2022-05-07 LAB — C-REACTIVE PROTEIN: CRP: 1.7 mg/dL — ABNORMAL HIGH (ref <1.0)

## 2022-05-07 LAB — MAGNESIUM: Magnesium: 2 mg/dL (ref 1.7–2.4)

## 2022-05-07 NOTE — Progress Notes (Signed)
[facility]                                                                              Kelly [name], is a 87 y.o. female, DOB - 03-15-1935, [id number]:[id number] Admit date - 05/03/2022    Outpatient Primary MD for the patient is Pcp, No  LOS - 3  days  Chief Complaint  Patient presents with   Shortness of Breath       Brief summary   Patient is a 87 year old female, bedbound status, dementia with behavioral disturbance, mood disorder with psychosis, diabetes mellitus type 2, CKD stage IIIa, HTN presented to ED from SNF with hypoxia.  Apparently there was an outbreak of COVID at the facility.  Patient was sent to the ER for evaluation.  She was on 2 L O2 with sats 98% when EMS arrived.  She also reportedly had a fever and had tested positive for COVID. On arrival, temp 101.4 F, heart rate 87, BP 106/55.  WBC count 11.2, hemoglobin 12.7, platelets 143, lactic acid mildly elevated 2.1 Chest x-ray showed bibasilar airspace opacities, creatinine 1.19, sodium 143.   Assessment & Plan    Principal Problem: Acute respiratory failure with hypoxia (HCC) secondary to pneumonia due to COVID-19 Severe sepsis secondary to COVID-19 -COVID-19 positive, patient met sepsis criteria on admission with fevers, mild tachycardia, hypotension, lactic acidosis -Received IV fluid hydration, procalcitonin 0.64, received 1 dose of IV Zithromax, Rocephin in ED -COVID +, continue molnupiravir, day # 4  -Continue p.o. prednisone    Active Problems:   Dementia with behavioral disturbance (HCC) -Continue Depakote, Seroquel, Ingrezza -Continue to hold Lexapro, Aricept due to bradycardia  -Avoid sedating meds    Essential hypertension -BP fairly stable    Secondary DM with CKD stage 3 and hypertension ([place]) -CBGs stable, closely monitor as patient is on steroids -Hemoglobin A1c 5.8 Recent Labs    05/06/22 0822 05/06/22 1338 05/06/22 1826 05/06/22 2115 05/07/22 0807 05/07/22 1210   GLUCAP 93 126* 119* 113* 106* 130*     Mild AKI on stage 3a chronic kidney disease (CKD) (HCC) -Baseline creatinine 0.8 -Creatinine 1.1 on admission, with lactic acidosis -Creatinine stable    Bedbound -Appears to be at baseline  Sinus bradycardia -Heart rate is in 40s at rest/sleeping, will hold Aricept, Lexapro -EKG on admission had shown sinus rhythm with prolonged QTc 575  Code Status: Full code DVT Prophylaxis:  heparin injection 5,000 Units Start: 05/04/22 0600 SCDs Start: 05/04/22 0426   Level of Care: Level of care: Telemetry Medical Family Communication: Updated patient's daughter, April Cox on phone today  Disposition Plan:      Remains inpatient appropriate: Started on COVID treatment, will complete COVID treatment tomorrow evening.  TOC will check with the facility regarding the requirements to return back.  Procedures:  None  Consultants:   None  Antimicrobials:   Anti-infectives (From admission, onward)    Start     Dose/Rate Route Frequency Ordered Stop   05/04/22 1000  nirmatrelvir/ritonavir EUA (PAXLOVID) 3 tablet  Status:  Discontinued        3 tablet Oral 2 times daily 05/04/22 0625 05/04/22 0704  05/04/22 0800  molnupiravir EUA (LAGEVRIO) capsule 800 mg        4 capsule Oral 2 times daily 05/04/22 0705 05/09/22 0959   05/04/22 0030  cefTRIAXone (ROCEPHIN) 1 g in sodium chloride 0.9 % 100 mL IVPB        1 g 200 mL/hr over 30 Minutes Intravenous  Once 05/04/22 0023 05/04/22 0140   05/04/22 0030  azithromycin (ZITHROMAX) 500 mg in sodium chloride 0.9 % 250 mL IVPB        500 mg 250 mL/hr over 60 Minutes Intravenous  Once 05/04/22 0023 05/04/22 0242          Medications  acetaminophen (TYLENOL) oral liquid 160 mg/5 mL  500 mg Oral Once   aspirin EC  81 mg Oral Daily   atorvastatin  40 mg Oral QHS   divalproex  125 mg Oral Q8H   heparin  5,000 Units Subcutaneous Q8H   insulin aspart  0-5 Units Subcutaneous QHS   insulin aspart  0-9 Units  Subcutaneous TID WC   levothyroxine  175 mcg Oral QAC breakfast   memantine  10 mg Oral BID   molnupiravir EUA  4 capsule Oral BID   predniSONE  40 mg Oral Q breakfast   QUEtiapine  50 mg Oral BID   valbenazine  80 mg Oral Daily      Subjective:   Kelly [name] was seen and examined today.  Sleepy, arousable.  Has advanced dementia, difficult to obtain review of system from the patient  Objective:   Vitals:   05/07/22 0004 05/07/22 0443 05/07/22 0500 05/07/22 0806  BP: (!) 143/70 (!) 132/54  (!) 140/39  Pulse: (!) 49 (!) 46  (!) 49  Resp:    16  Temp: 98.2 F (36.8 C) 97.7 F (36.5 C)  97.8 F (36.6 C)  TempSrc: Oral Oral  Oral  SpO2: 95% 97%  97%  Weight:   81.5 kg     Intake/Output Summary (Last 24 hours) at 05/07/2022 1412 Last data filed at 05/07/2022 1212 Gross per 24 hour  Intake 260 ml  Output 200 ml  Net 60 ml     Wt Readings from Last 3 Encounters:  05/07/22 81.5 kg  11/07/21 75.7 kg  10/24/21 75.7 kg    Physical Exam General: Somnolent, NAD, arousable Cardiovascular: S1 S2 clear, RRR.  Respiratory: CTAB, no wheezing Gastrointestinal: Soft, nontender, nondistended, NBS Ext: no pedal edema bilaterally Psych: has advanced dementia, somewhat somnolent   Data Reviewed:  I have personally reviewed following labs    CBC Lab Results  Component Value Date   WBC 5.0 05/07/2022   RBC 3.90 05/07/2022   HGB 11.8 (L) 05/07/2022   HCT 36.2 05/07/2022   MCV 92.8 05/07/2022   MCH 30.3 05/07/2022   PLT 142 (L) 05/07/2022   MCHC 32.6 05/07/2022   RDW 15.0 05/07/2022   LYMPHSABS 1.7 05/03/2022   MONOABS 1.4 (H) 05/03/2022   EOSABS 0.0 05/03/2022   BASOSABS 0.0 28/31/5176     Last metabolic panel Lab Results  Component Value Date   NA 143 05/07/2022   K 4.3 05/07/2022   CL 107 05/07/2022   CO2 28 05/07/2022   BUN 15 05/07/2022   CREATININE 1.03 (H) 05/07/2022   GLUCOSE 97 05/07/2022   GFRNONAA 53 (L) 05/07/2022   GFRAA 80.74 07/13/2021    CALCIUM 9.1 05/07/2022   PROT 6.3 (L) 05/07/2022   ALBUMIN 2.8 (L) 05/07/2022   BILITOT 0.4 05/07/2022   ALKPHOS 52 05/07/2022  AST 39 05/07/2022   ALT 19 05/07/2022   ANIONGAP 8 05/07/2022    CBG (last 3)  Recent Labs    05/06/22 2115 05/07/22 0807 05/07/22 1210  GLUCAP 113* 106* 130*      Coagulation Profile: Recent Labs  Lab 05/03/22 2249  INR 1.3*     Radiology Studies: I have personally reviewed the imaging studies  No results found.     Estill Cotta M.D. [facility] 05/07/2022, 2:12 PM  Available via Epic secure chat 7am-7pm After 7 pm, please refer to night coverage provider listed on amion.

## 2022-05-07 NOTE — TOC Initial Note (Addendum)
Transition of Care [facility]) - Initial/Assessment Note    Patient Details  Name: Kelly [name] MRN: [id number] Date of Birth: [birth year]  [facility]) CM/SW Contact:    Delilah Shan, LCSWA Phone Number: 05/07/2022, 4:15 PM  Clinical Narrative:                  Due to patients current orientation.CSW spoke with patients daughter April who confirmed patient comes from [facility] Rehab long term. April patients daughter confirmed plan is for patient to return to [facility] when patient is medically ready for dc.CSW called Kitty with [facility] and LVM. CSW awaiting callback. CSW will continue to follow and assist with patients dc planning needs.  Expected Discharge Plan: Skilled Nursing Facility Barriers to Discharge: Continued Medical Work up   Patient Goals and CMS Choice   CMS Medicare.gov Compare Post Acute Care list provided to:: Patient Represenative (must comment) (Patients daughter April) Choice offered to / list presented to : Adult Children  Expected Discharge Plan and Services Expected Discharge Plan: Skilled Nursing Facility In-house Referral: Clinical Social Work     Living arrangements for the past 2 months: Skilled Nursing Facility                                      Prior Living Arrangements/Services Living arrangements for the past 2 months: Skilled Nursing Facility Lives with:: Facility Resident (From [facility]) Patient language and need for interpreter reviewed:: Yes Do you feel safe going back to the place where you live?: Yes      Need for Family Participation in Patient Care: Yes (Comment) Care giver support system in place?: Yes (comment)   Criminal Activity/Legal Involvement Pertinent to Current Situation/Hospitalization: No - Comment as needed  Activities of Daily Living      Permission Sought/Granted Permission sought to share information with : Case Manager, Family Supports, Neurosurgeon Permission granted to share information with : Yes, Verbal Permission Granted  Share Information with NAME: April  Permission granted to share info w AGENCY: SNF  Permission granted to share info w Relationship: daughter  Permission granted to share info w Contact Information: April [phone]  Emotional Assessment Appearance:: Appears stated age     Orientation: : Oriented to Self Alcohol / Substance Use: Not Applicable Psych Involvement: No (comment)  Admission diagnosis:  Acute respiratory failure with hypoxia (HCC) [J96.01] Community acquired pneumonia, unspecified laterality [J18.9] Patient Active Problem List   Diagnosis Date Noted   Acute respiratory failure with hypoxia (HCC) 05/04/2022   Stage 3a chronic kidney disease (CKD) (HCC) 05/04/2022   Bedbound 05/04/2022   Pneumonia 05/04/2022   Pneumonia due to COVID-19 virus 05/04/2022   Epistaxis 10/24/2021   Normochromic normocytic anemia 10/24/2021   Unspecified protein-calorie malnutrition (HCC) 05/02/2021   Pancytopenia (HCC) 07/05/2020   Secondary DM with CKD stage 3 and hypertension (HCC)    UTI (urinary tract infection) 05/05/2019   Fall 05/03/2019   Anxiety 04/16/2019   Vitamin D deficiency    Occult blood positive stool 02/17/2019   MDD (major depressive disorder)    Chronic leukopenia 03/04/2018   Thrombocytopenia (HCC) 02/20/2018   Mood disorder with psychosis (HCC) 02/20/2018   Hyperlipidemia 10/03/2016   Dementia with behavioral disturbance (HCC) 04/12/2016   Prediabetes 04/12/2016   Essential hypertension 04/12/2016   Hypothyroidism 04/12/2016   Chronic kidney disease 04/12/2016  Advance care planning 03/19/2015   PCP:  Pcp, No Pharmacy:   [facility] - [place], [place] - [address] [place] [address] Phone: [phone] Fax: [phone]     Social Determinants of Health (SDOH) Interventions     Readmission Risk Interventions     No data to display

## 2022-05-08 DIAGNOSIS — J9601 Acute respiratory failure with hypoxia: Secondary | ICD-10-CM | POA: Diagnosis not present

## 2022-05-08 DIAGNOSIS — F03918 Unspecified dementia, unspecified severity, with other behavioral disturbance: Secondary | ICD-10-CM | POA: Diagnosis not present

## 2022-05-08 DIAGNOSIS — I10 Essential (primary) hypertension: Secondary | ICD-10-CM | POA: Diagnosis not present

## 2022-05-08 DIAGNOSIS — J189 Pneumonia, unspecified organism: Secondary | ICD-10-CM | POA: Diagnosis not present

## 2022-05-08 LAB — CULTURE, BLOOD (ROUTINE X 2)
Culture: NO GROWTH
Special Requests: ADEQUATE

## 2022-05-08 LAB — GLUCOSE, CAPILLARY
Glucose-Capillary: 105 mg/dL — ABNORMAL HIGH (ref 70–99)
Glucose-Capillary: 119 mg/dL — ABNORMAL HIGH (ref 70–99)
Glucose-Capillary: 142 mg/dL — ABNORMAL HIGH (ref 70–99)

## 2022-05-08 LAB — URINE CULTURE: Culture: 40000 — AB

## 2022-05-08 NOTE — Progress Notes (Signed)
[facility]                                                                              Kelly [name], is a 87 y.o. female, DOB - 01/02/1935, IYM:[id number] Admit date - 05/03/2022    Outpatient Primary MD for the patient is Pcp, No  LOS - 4  days  Chief Complaint  Patient presents with   Shortness of Breath       Brief summary   Patient is a 87 year old female, bedbound status, dementia with behavioral disturbance, mood disorder with psychosis, diabetes mellitus type 2, CKD stage IIIa, HTN presented to ED from SNF with hypoxia.  Apparently there was an outbreak of COVID at the facility.  Patient was sent to the ER for evaluation.  She was on 2 L O2 with sats 98% when EMS arrived.  She also reportedly had a fever and had tested positive for COVID. On arrival, temp 101.4 F, heart rate 87, BP 106/55.  WBC count 11.2, hemoglobin 12.7, platelets 143, lactic acid mildly elevated 2.1 Chest x-ray showed bibasilar airspace opacities, creatinine 1.19, sodium 143.   Assessment & Plan    Principal Problem: Acute respiratory failure with hypoxia (HCC) secondary to pneumonia due to COVID-19 Severe sepsis secondary to COVID-19 -COVID-19 positive, patient met sepsis criteria on admission with fevers, mild tachycardia, hypotension, lactic acidosis -Received IV fluid hydration, procalcitonin 0.64, received 1 dose of IV Zithromax, Rocephin in ED -COVID +, continue molnupiravir, day #5, will complete treatment tonight -Continue p.o. prednisone    Active Problems:   Dementia with behavioral disturbance (HCC) -Continue Depakote, Seroquel, Ingrezza -Continue to hold Lexapro, Aricept due to bradycardia. HR better -Avoid sedating meds    Essential hypertension -BP stable    Secondary DM with CKD stage 3 and hypertension ([place]) -Hemoglobin A1c 5.8 Recent Labs    05/07/22 0807 05/07/22 1210 05/07/22 1638 05/07/22 2132 05/08/22 0922 05/08/22 1149  GLUCAP 106* 130* 150*  104* 105* 119*   -Continue sliding scale insulin  Mild AKI on stage 3a chronic kidney disease (CKD) (HCC) -Baseline creatinine 0.8 -Creatinine 1.1 on admission, with lactic acidosis Creatinine stable, at baseline    Bedbound -Appears to be at baseline  Sinus bradycardia -Heart rate is in 40s at rest/sleeping, will hold Aricept, Lexapro -EKG on admission had shown sinus rhythm with prolonged QTc 575 -Heart rate now stable in 50 days  Code Status: Full code DVT Prophylaxis:  heparin injection 5,000 Units Start: 05/04/22 0600 SCDs Start: 05/04/22 0426   Level of Care: Level of care: Telemetry Medical Family Communication: Updated patient's daughter, Kelly [name] on phone on 11/20 Disposition Plan:      Remains inpatient appropriate: Plan to discharge tomorrow a.m. after completing molnupiravir treatment tonight Procedures:  None  Consultants:   None  Antimicrobials:   Anti-infectives (From admission, onward)    Start     Dose/Rate Route Frequency Ordered Stop   05/04/22 1000  nirmatrelvir/ritonavir EUA (PAXLOVID) 3 tablet  Status:  Discontinued        3 tablet Oral 2 times daily 05/04/22 0625 05/04/22 0704   05/04/22 0800  molnupiravir EUA (LAGEVRIO) capsule  800 mg        4 capsule Oral 2 times daily 05/04/22 0705 05/09/22 0959   05/04/22 0030  cefTRIAXone (ROCEPHIN) 1 g in sodium chloride 0.9 % 100 mL IVPB        1 g 200 mL/hr over 30 Minutes Intravenous  Once 05/04/22 0023 05/04/22 0140   05/04/22 0030  azithromycin (ZITHROMAX) 500 mg in sodium chloride 0.9 % 250 mL IVPB        500 mg 250 mL/hr over 60 Minutes Intravenous  Once 05/04/22 0023 05/04/22 0242          Medications  acetaminophen (TYLENOL) oral liquid 160 mg/5 mL  500 mg Oral Once   aspirin EC  81 mg Oral Daily   atorvastatin  40 mg Oral QHS   divalproex  125 mg Oral Q8H   heparin  5,000 Units Subcutaneous Q8H   insulin aspart  0-5 Units Subcutaneous QHS   insulin aspart  0-9 Units Subcutaneous TID  WC   levothyroxine  175 mcg Oral QAC breakfast   memantine  10 mg Oral BID   molnupiravir EUA  4 capsule Oral BID   predniSONE  40 mg Oral Q breakfast   QUEtiapine  50 mg Oral BID   valbenazine  80 mg Oral Daily      Subjective:   Kelly [name] was seen and examined today.  Alert and awake, responding appropriately.  Has history of dementia no acute issues.  Objective:   Vitals:   05/07/22 2146 05/08/22 0500 05/08/22 0606 05/08/22 0937  BP: (!) 173/77  (!) 157/78 128/70  Pulse: (!) 50  (!) 51 (!) 53  Resp: _0 Temp: 98.3 F (36.8 C)  98 F (36.7 C) 97.7 F (36.5 C)  TempSrc: Oral  Oral Oral  SpO2: 96%  100% 95%  Weight:  82.4 kg      Intake/Output Summary (Last 24 hours) at 05/08/2022 1243 Last data filed at 05/08/2022 6295 Gross per 24 hour  Intake 480 ml  Output 400 ml  Net 80 ml     Wt Readings from Last 3 Encounters:  05/08/22 82.4 kg  11/07/21 75.7 kg  10/24/21 75.7 kg   Physical Exam General: Alert and awake, NAD, Cardiovascular: S1 S2 clear, RRR.  Respiratory: CTAB, no wheezing, rales  Gastrointestinal: Soft, nontender, nondistended, NBS Ext: no pedal edema bilaterally Neuro: no new deficits Psych: has dementia    Data Reviewed:  I have personally reviewed following labs    CBC Lab Results  Component Value Date   WBC 5.0 05/07/2022   RBC 3.90 05/07/2022   HGB 11.8 (L) 05/07/2022   HCT 36.2 05/07/2022   MCV 92.8 05/07/2022   MCH 30.3 05/07/2022   PLT 142 (L) 05/07/2022   MCHC 32.6 05/07/2022   RDW 15.0 05/07/2022   LYMPHSABS 1.7 05/03/2022   MONOABS 1.4 (H) 05/03/2022   EOSABS 0.0 05/03/2022   BASOSABS 0.0 28/41/3244     Last metabolic panel Lab Results  Component Value Date   NA 143 05/07/2022   K 4.3 05/07/2022   CL 107 05/07/2022   CO2 28 05/07/2022   BUN 15 05/07/2022   CREATININE 1.03 (H) 05/07/2022   GLUCOSE 97 05/07/2022   GFRNONAA 53 (L) 05/07/2022   GFRAA 80.74 07/13/2021   CALCIUM 9.1 05/07/2022   PROT  6.3 (L) 05/07/2022   ALBUMIN 2.8 (L) 05/07/2022   BILITOT 0.4 05/07/2022   ALKPHOS 52 05/07/2022   AST 39 05/07/2022  ALT 19 05/07/2022   ANIONGAP 8 05/07/2022    CBG (last 3)  Recent Labs    05/07/22 2132 05/08/22 0922 05/08/22 1149  GLUCAP 104* 105* 119*      Coagulation Profile: Recent Labs  Lab 05/03/22 2249  INR 1.3*     Radiology Studies: I have personally reviewed the imaging studies  No results found.     Estill Cotta M.D. [facility] 05/08/2022, 12:43 PM  Available via Epic secure chat 7am-7pm After 7 pm, please refer to night coverage provider listed on amion.

## 2022-05-08 NOTE — TOC Progression Note (Signed)
Transition of Care [facility]) - Progression Note    Patient Details  Name: Kelly [name] MRN: [id number] Date of Birth: 1935-06-05  Transition of [facility]) CM/SW Contact  Mearl Latin, LCSW Phone Number: 05/08/2022, 9:48 AM  Clinical Narrative:    CSW updated [facility] that patient will be ready tomorrow per MD.    Expected Discharge Plan: Skilled Nursing Facility Barriers to Discharge: Continued Medical Work up  Expected Discharge Plan and Services Expected Discharge Plan: Skilled Nursing Facility In-house Referral: Clinical Social Work     Living arrangements for the past 2 months: Skilled Nursing Facility                                       Social Determinants of Health (SDOH) Interventions    Readmission Risk Interventions     No data to display

## 2022-05-08 NOTE — NC FL2 (Signed)
[place] MEDICAID FL2 LEVEL OF CARE SCREENING TOOL     IDENTIFICATION  Patient Name: Kelly [name] Birthdate: 09-Sep-1934 Sex: female Admission Date (Current Location): 05/03/2022  [facility] and [place] Number:  Producer, television/film/video and Address:  [facility] [place], [address], [place], [place] [address]      Provider Number: [id number]  Attending Physician Name and Address:  Cathren Harsh, MD  Relative Name and Phone Number:       Current Level of Care: Hospital Recommended Level of Care: Skilled Nursing Facility Prior Approval Number:    Date Approved/Denied:   PASRR Number: [id number]  Discharge Plan: SNF    Current Diagnoses: Patient Active Problem List   Diagnosis Date Noted   Acute respiratory failure with hypoxia (HCC) 05/04/2022   Stage 3a chronic kidney disease (CKD) (HCC) 05/04/2022   Bedbound 05/04/2022   Pneumonia 05/04/2022   Pneumonia due to COVID-19 virus 05/04/2022   Epistaxis 10/24/2021   Normochromic normocytic anemia 10/24/2021   Unspecified protein-calorie malnutrition (HCC) 05/02/2021   Pancytopenia (HCC) 07/05/2020   Secondary DM with CKD stage 3 and hypertension (HCC)    UTI (urinary tract infection) 05/05/2019   Fall 05/03/2019   Anxiety 04/16/2019   Vitamin D deficiency    Occult blood positive stool 02/17/2019   MDD (major depressive disorder)    Chronic leukopenia 03/04/2018   Thrombocytopenia (HCC) 02/20/2018   Mood disorder with psychosis (HCC) 02/20/2018   Hyperlipidemia 10/03/2016   Dementia with behavioral disturbance (HCC) 04/12/2016   Prediabetes 04/12/2016   Essential hypertension 04/12/2016   Hypothyroidism 04/12/2016   Chronic kidney disease 04/12/2016   Advance care planning 03/19/2015    Orientation RESPIRATION BLADDER Height & Weight     Self  Normal Incontinent Weight: 181 lb 10.5 oz (82.4 kg) Height:     BEHAVIORAL SYMPTOMS/MOOD NEUROLOGICAL BOWEL NUTRITION STATUS      Incontinent Diet  (See dc summary)  AMBULATORY STATUS COMMUNICATION OF NEEDS Skin   Extensive Assist Verbally Normal                       Personal Care Assistance Level of Assistance  Bathing, Feeding, Dressing Bathing Assistance: Maximum assistance Feeding assistance: Limited assistance Dressing Assistance: Limited assistance     Functional Limitations Info             SPECIAL CARE FACTORS FREQUENCY                       Contractures Contractures Info: Not present    Additional Factors Info  Code Status, Allergies, Isolation Precautions, Psychotropic, Insulin Sliding Scale Code Status Info: Full Allergies Info: NKA Psychotropic Info: Seroquel Insulin Sliding Scale Info: see dc summary Isolation Precautions Info: COVID+ 05/04/22     Current Medications (05/08/2022):  This is the current hospital active medication list Current Facility-Administered Medications  Medication Dose Route Frequency Provider Last Rate Last Admin   acetaminophen (TYLENOL) 160 MG/5ML solution 500 mg  500 mg Oral Once Carollee Herter, DO       acetaminophen (TYLENOL) tablet 650 mg  650 mg Oral Q6H PRN Carollee Herter, DO       Or   acetaminophen (TYLENOL) suppository 650 mg  650 mg Rectal Q6H PRN Carollee Herter, DO       albuterol (PROVENTIL) (2.5 MG/3ML) 0.083% nebulizer solution 2.5 mg  2.5 mg Nebulization Q2H PRN Carollee Herter, DO       aspirin EC tablet  81 mg  81 mg Oral Daily Carollee Herter, DO   81 mg at 05/08/22 6063   atorvastatin (LIPITOR) tablet 40 mg  40 mg Oral QHS Carollee Herter, DO   40 mg at 05/07/22 2145   divalproex (DEPAKOTE SPRINKLE) capsule 125 mg  125 mg Oral Q8H Carollee Herter, DO   125 mg at 05/08/22 0160   heparin injection 5,000 Units  5,000 Units Subcutaneous Q8H Carollee Herter, DO   5,000 Units at 05/08/22 1093   insulin aspart (novoLOG) injection 0-5 Units  0-5 Units Subcutaneous QHS Carollee Herter, DO       insulin aspart (novoLOG) injection 0-9 Units  0-9 Units Subcutaneous TID WC Carollee Herter, DO   1 Units at  05/07/22 1703   levothyroxine (SYNTHROID) tablet 175 mcg  175 mcg Oral QAC breakfast Carollee Herter, DO   175 mcg at 05/08/22 0923   memantine (NAMENDA) tablet 10 mg  10 mg Oral BID Carollee Herter, DO   10 mg at 05/08/22 2355   molnupiravir EUA (LAGEVRIO) capsule 800 mg  4 capsule Oral BID Rai, Ripudeep K, MD   800 mg at 05/08/22 0924   ondansetron (ZOFRAN) tablet 4 mg  4 mg Oral Q6H PRN Carollee Herter, DO       Or   ondansetron [facility]) injection 4 mg  4 mg Intravenous Q6H PRN Carollee Herter, DO       predniSONE (DELTASONE) tablet 40 mg  40 mg Oral Q breakfast Rai, Ripudeep K, MD   40 mg at 05/08/22 7322   QUEtiapine (SEROQUEL) tablet 50 mg  50 mg Oral BID Rai, Ripudeep K, MD   50 mg at 05/08/22 0254   valbenazine (INGREZZA) capsule 80 mg  80 mg Oral Daily Rai, Ripudeep K, MD   80 mg at 05/08/22 2706     Discharge Medications: Please see discharge summary for a list of discharge medications.  Relevant Imaging Results:  Relevant Lab Results:   Additional Information SSN: [id number] Lawson, LCSW

## 2022-05-09 DIAGNOSIS — Z7401 Bed confinement status: Secondary | ICD-10-CM | POA: Diagnosis not present

## 2022-05-09 DIAGNOSIS — U071 COVID-19: Secondary | ICD-10-CM | POA: Diagnosis not present

## 2022-05-09 DIAGNOSIS — J189 Pneumonia, unspecified organism: Secondary | ICD-10-CM | POA: Diagnosis not present

## 2022-05-09 DIAGNOSIS — J9601 Acute respiratory failure with hypoxia: Secondary | ICD-10-CM | POA: Diagnosis not present

## 2022-05-09 LAB — CULTURE, BLOOD (ROUTINE X 2): Culture: NO GROWTH

## 2022-05-09 LAB — GLUCOSE, CAPILLARY
Glucose-Capillary: 126 mg/dL — ABNORMAL HIGH (ref 70–99)
Glucose-Capillary: 83 mg/dL (ref 70–99)

## 2022-05-09 MED ORDER — PREDNISONE 20 MG PO TABS: 40.0000 mg | ORAL_TABLET | Freq: Every day | ORAL | 0 refills | Status: AC

## 2022-05-09 MED ORDER — LEVOTHYROXINE SODIUM 25 MCG PO TABS: 137.0000 ug | ORAL_TABLET | Freq: Every day | ORAL | Status: DC

## 2022-05-09 MED ORDER — ALBUTEROL SULFATE HFA 108 (90 BASE) MCG/ACT IN AERS: 2.0000 | INHALATION_SPRAY | Freq: Four times a day (QID) | RESPIRATORY_TRACT | 2 refills | Status: AC | PRN

## 2022-05-09 NOTE — Plan of Care (Signed)
  Problem: Education: Goal: Ability to describe self-care measures that may prevent or decrease complications (Diabetes Survival Skills Education) will improve Outcome: Adequate for Discharge Goal: Individualized Educational Video(s) Outcome: Adequate for Discharge   Problem: Coping: Goal: Ability to adjust to condition or change in health will improve Outcome: Adequate for Discharge   Problem: Fluid Volume: Goal: Ability to maintain a balanced intake and output will improve Outcome: Adequate for Discharge   Problem: Metabolic: Goal: Ability to maintain appropriate glucose levels will improve Outcome: Adequate for Discharge   Problem: Nutritional: Goal: Maintenance of adequate nutrition will improve Outcome: Adequate for Discharge Goal: Progress toward achieving an optimal weight will improve Outcome: Adequate for Discharge   Problem: Skin Integrity: Goal: Risk for impaired skin integrity will decrease Outcome: Adequate for Discharge   Problem: Tissue Perfusion: Goal: Adequacy of tissue perfusion will improve Outcome: Adequate for Discharge   Problem: Activity: Goal: Ability to tolerate increased activity will improve Outcome: Adequate for Discharge   Problem: Clinical Measurements: Goal: Ability to maintain a body temperature in the normal range will improve Outcome: Adequate for Discharge   Problem: Respiratory: Goal: Ability to maintain adequate ventilation will improve Outcome: Adequate for Discharge Goal: Ability to maintain a clear airway will improve Outcome: Adequate for Discharge   Problem: Education: Goal: Knowledge of General Education information will improve Description: Including pain rating scale, medication(s)/side effects and non-pharmacologic comfort measures Outcome: Adequate for Discharge   Problem: Health Behavior/Discharge Planning: Goal: Ability to manage health-related needs will improve Outcome: Adequate for Discharge   Problem: Clinical  Measurements: Goal: Ability to maintain clinical measurements within normal limits will improve Outcome: Adequate for Discharge Goal: Will remain free from infection Outcome: Adequate for Discharge Goal: Diagnostic test results will improve Outcome: Adequate for Discharge Goal: Respiratory complications will improve Outcome: Adequate for Discharge Goal: Cardiovascular complication will be avoided Outcome: Adequate for Discharge   Problem: Activity: Goal: Risk for activity intolerance will decrease Outcome: Adequate for Discharge   Problem: Nutrition: Goal: Adequate nutrition will be maintained Outcome: Adequate for Discharge   Problem: Coping: Goal: Level of anxiety will decrease Outcome: Adequate for Discharge   Problem: Elimination: Goal: Will not experience complications related to bowel motility Outcome: Adequate for Discharge Goal: Will not experience complications related to urinary retention Outcome: Adequate for Discharge   Problem: Pain Managment: Goal: General experience of comfort will improve Outcome: Adequate for Discharge   Problem: Safety: Goal: Ability to remain free from injury will improve Outcome: Adequate for Discharge   Problem: Skin Integrity: Goal: Risk for impaired skin integrity will decrease Outcome: Adequate for Discharge

## 2022-05-09 NOTE — Progress Notes (Signed)
Reported was given to Casimiro Needle the receiving RN at the patient's SNF. Awaiting [facility]AR for transportation to the facility.   Orvan Seen Swot RN

## 2022-05-09 NOTE — Progress Notes (Addendum)
Attempted to call report to [place] at [place]. Was instructed she would return the call after her break.   Corey Harold RN

## 2022-05-09 NOTE — TOC Transition Note (Signed)
Transition of Care [facility]) - CM/SW Discharge Note   Patient Details  Name: Kelly [name] MRN: [id number] Date of Birth: 1935-01-03  Transition of Care [facility] CM/SW Contact:  Mearl Latin, LCSW Phone Number: 05/09/2022, 9:46 AM   Clinical Narrative:    Patient will DC to: [facility] Anticipated DC date: 05/09/22 Family notified: Daughter, April Transport by: Sharin Mons   Per MD patient ready for DC to [place]. RN to call report prior to discharge [phone]). RN, patient, patient's family, and facility notified of DC. Discharge Summary and FL2 sent to facility. DC packet on chart. Ambulance transport requested for patient.   CSW will sign off for now as social work intervention is no longer needed. Please consult [place] again if new needs arise.     Final next level of care: Skilled Nursing Facility Barriers to Discharge: Barriers Resolved   Patient Goals and CMS Choice Patient states their goals for this hospitalization and ongoing recovery are:: Return to snf CMS Medicare.gov Compare Post Acute Care list provided to:: Patient Represenative (must comment) (Patients daughter April) Choice offered to / list presented to : Adult Children  Discharge Placement   Existing PASRR number confirmed : 05/09/22          Patient [facility] Patient to be transferred to facility by: PTAR Name of family member notified: Daughter April Patient and family notified of of transfer: 05/09/22  Discharge Plan and Services In-house Referral: Clinical Social Work                                   Social Determinants of Health (SDOH) Interventions     Readmission Risk Interventions     No data to display

## 2022-05-09 NOTE — Discharge Summary (Addendum)
Physician Discharge Summary   Patient: Kelly [name] MRN: [id number] DOB: March 30, 1935  Admit date:     05/03/2022  Discharge date: 05/09/22  Discharge Physician: Estill Cotta, MD    PCP: Pcp, No   Recommendations at discharge:    Prednisone 40 mg po daily x 1 more day, then stop Aricept, Lexapro held due to bradycardia   Discharge Diagnoses:    Acute respiratory failure with hypoxia (HCC) Severe sepsis secondary to COVID-19   Pneumonia due to COVID-19 virus   Dementia with behavioral disturbance (HCC)   Essential hypertension   Secondary DM with CKD stage 3 and hypertension (HCC)   Mild AKI on Stage 3a chronic kidney disease (CKD) ([place])   Bedbound   Sinus bradycardia     Hospital Course:  Patient is a 87 year old female, bedbound status, dementia with behavioral disturbance, mood disorder with psychosis, diabetes mellitus type 2, CKD stage IIIa, HTN presented to ED from SNF with hypoxia.  Apparently there was an outbreak of COVID at the facility.  Patient was sent to the ER for evaluation.  She was on 2 L O2 with sats 98% when EMS arrived.  She also reportedly had a fever and had tested positive for COVID. On arrival, temp 101.4 F, heart rate 87, BP 106/55.  WBC count 11.2, hemoglobin 12.7, platelets 143, lactic acid mildly elevated 2.1 Chest x-ray showed bibasilar airspace opacities, creatinine 1.19, sodium 143.  Assessment and Plan:   Acute respiratory failure with hypoxia (HCC) secondary to pneumonia due to COVID-19 Severe sepsis secondary to COVID-19 -COVID-19 positive, patient met sepsis criteria on admission with fevers, mild tachycardia, hypotension, lactic acidosis -Received IV fluid hydration, procalcitonin 0.64, received 1 dose of IV Zithromax, Rocephin in ED -COVID +, completed molnupiravir x 5 days.  Continue p.o. prednisone 40 mg daily for 1 more day on 11/23       Dementia with behavioral disturbance (HCC) -Continue Depakote, Seroquel, Ingrezza -Continue to  hold Lexapro, Aricept due to bradycardia. HR better, 84 this morning -Avoid sedating meds     Essential hypertension -BP stable     Secondary DM with CKD stage 3 and hypertension (HCC) -Hemoglobin A1c 5.8 -Continue carb modified diet   Mild AKI on stage 3a chronic kidney disease (CKD) (HCC) - Baseline creatinine 0.8 - Creatinine 1.1 on admission, with lactic acidosis - Creatinine stable, at baseline     Bedbound -Appears to be at baseline   Sinus bradycardia -Heart rate was noted to be in 40s at rest/sleeping, hence Aricept, Lexapro held -EKG on admission had shown sinus rhythm with prolonged QTc 575 -HR now stable, in 80's       Pain control - [facility] Controlled Substance Reporting System database was reviewed. and patient was instructed, not to drive, operate heavy machinery, perform activities at heights, swimming or participation in water activities or provide baby-sitting services while on Pain, Sleep and Anxiety Medications; until their outpatient Physician has advised to do so again. Also recommended to not to take more than prescribed Pain, Sleep and Anxiety Medications.  Consultants: none  Procedures performed: none  Disposition: Skilled nursing facility Diet recommendation:  Carb modified diet DISCHARGE MEDICATION: Allergies as of 05/09/2022   No Known Allergies      Medication List     STOP taking these medications    donepezil 10 MG tablet Commonly known as: ARICEPT   escitalopram 5 MG/5ML solution Commonly known as: LEXAPRO       TAKE these medications    acetaminophen  325 MG tablet Commonly known as: TYLENOL Take 650 mg by mouth every 6 (six) hours as needed.   albuterol 108 (90 Base) MCG/ACT inhaler Commonly known as: VENTOLIN HFA Inhale 2 puffs into the lungs every 6 (six) hours as needed for wheezing or shortness of breath.   ARTIFICIAL TEAR SOLUTION OP Apply 2 drops to eye in the morning and at bedtime. *Both eyes*   aspirin EC  81 MG tablet Take 81 mg by mouth daily.   atorvastatin 40 MG tablet Commonly known as: LIPITOR Take 40 mg by mouth at bedtime.   bisacodyl 10 MG suppository Commonly known as: DULCOLAX Place 10 mg rectally daily as needed for mild constipation or moderate constipation. If not relieved by MOM, give 10 mg Bisacodyl suppositiory rectally X 1 dose in 24 hours as needed (Do not use constipation standing orders for residents with renal failure/CFR less than 30. Contact MD for orders) (Physician Order)   Calcium-Vitamin D3 600-400 MG-UNIT Tabs Take 1 tablet by mouth 2 (two) times daily.   carbamide peroxide 6.5 % OTIC solution Commonly known as: DEBROX Place 2-3 drops into the right ear See admin instructions. 2-3 drops to right ear 3 times a week until next audiology appointment   cholecalciferol 25 MCG (1000 UNIT) tablet Commonly known as: VITAMIN D3 Take 2,000 Units by mouth daily. Take 2 tablet (2000 unit total) by mouth one time daily for vitamin deficiency   divalproex 125 MG capsule Commonly known as: DEPAKOTE SPRINKLE Take 125 mg by mouth in the morning, at noon, in the evening, and at bedtime.   furosemide 20 MG tablet Commonly known as: LASIX Take 20 mg by mouth every Monday, Wednesday, and Friday.   Ingrezza 80 MG capsule Generic drug: valbenazine Take 80 mg by mouth daily.   levothyroxine 137 MCG tablet Commonly known as: SYNTHROID Take 137 mcg by mouth daily before breakfast.   magnesium hydroxide 400 MG/5ML suspension Commonly known as: MILK OF MAGNESIA Take 30 mLs by mouth daily as needed for mild constipation or moderate constipation. If no BM in 3 days, give 30 cc Milk of Magnesium p.o. x 1 dose in 24 hours as needed (Do not use standing constipation orders for residents with renal failure CFR less than 30. Contact MD for orders) (Physician Order)   memantine 10 MG tablet Commonly known as: NAMENDA Take 10 mg by mouth 2 (two) times daily.   Minerin Creme  Crea Apply 1 Application topically in the morning and at bedtime. Apply topically to ankles twice a day for dry skin.   MULTI-VITAMIN DAILY PO Take 1 tablet by mouth daily.   NON FORMULARY Apply 1 application  topically in the morning, at noon, and at bedtime. Ativan 0.35m/ml topical Gel   NUTRITIONAL SUPPLEMENT PO Take 1 each by mouth daily. Magic Cup to help stabilize weight   ENSURE ENLIVE PO Take 8 fluid ounces by mouth daily. ENSURE ENLIVE LIQUID GIVE 8 OUNCE BY MOUTH ONCE DAILY FOR SUPPLEMENT   feeding supplement Liqd Take 1 Container by mouth daily. IF ENSURE IS UNAVAILABLE   predniSONE 20 MG tablet Commonly known as: DELTASONE Take 2 tablets (40 mg total) by mouth daily with breakfast for 1 day. Start taking on: May 10, 2022   QUEtiapine 50 MG tablet Commonly known as: SEROQUEL Take 50 mg by mouth 2 (two) times daily.   RA SALINE ENEMA RE Place 1 enema rectally daily as needed (Constipation). If not relieved by Biscodyl suppository, give disposable Saline Enema  rectally X 1 dose/24 hrs as needed (Do not use constipation standing orders for residents with renal failure/CFR less than 30. Contact MD for orders)(Physician Or   sennosides-docusate sodium 8.6-50 MG tablet Commonly known as: SENOKOT-S Take 2 tablets by mouth daily.        Contact information for after-discharge care     Destination     [facility] Preferred SNF .   Service: Skilled Nursing Contact information: [address] [place] [phone]                    Discharge Exam: Filed Weights   05/06/22 0500 05/07/22 0500 05/08/22 0500  Weight: 69.5 kg 81.5 kg 82.4 kg   S: alert and awake, no acute complaints, completed COVID treatment. No fevers or chills.  Mental status close to baseline.  Physical Exam General: Alert and awake, oriented to self, NAD, has dementia Cardiovascular: S1 S2 clear, RRR.  Respiratory: CTAB, no  wheezing, rales or rhonchi Gastrointestinal: Soft, nontender, nondistended, NBS Ext: no pedal edema bilaterally Neuro: moving all 4 extremities spontaneously Psych: dementia   Condition at discharge: fair  The results of significant diagnostics from this hospitalization (including imaging, microbiology, ancillary and laboratory) are listed below for reference.   Imaging Studies: DG Chest Port 1 View  Result Date: 05/03/2022 CLINICAL DATA:  Questionable sepsis - evaluate for abnormality EXAM: PORTABLE CHEST 1 VIEW COMPARISON:  Chest x-ray 05/09/2021 FINDINGS: The heart and mediastinal contours are unchanged. Aortic calcification. Low lung volumes. Developing hazy airspace opacities at the left lower lung base and right mid lung zone. No pulmonary edema. No definite pleural effusion. No pneumothorax. No acute osseous abnormality. IMPRESSION: 1. Low lung volumes. Developing hazy airspace opacities at the left lower lung base and right mid lung zone. Consider repeating PA and lateral view of the chest with improved inspiratory effort. 2.  Aortic Atherosclerosis (ICD10-I70.0). Electronically Signed   By: Iven Finn M.D.   On: 05/03/2022 23:06    Microbiology: Results for orders placed or performed during the hospital encounter of 05/03/22  Blood Culture (routine x 2)     Status: None   Collection Time: 05/03/22 10:49 PM   Specimen: BLOOD LEFT WRIST  Result Value Ref Range Status   Specimen Description BLOOD LEFT WRIST  Final   Special Requests   Final    BOTTLES DRAWN AEROBIC AND ANAEROBIC Blood Culture adequate volume   Culture   Final    NO GROWTH 5 DAYS Performed at [facility] [address] [place], [place] [address]    Report Status 05/08/2022 FINAL  Final  Blood Culture (routine x 2)     Status: None   Collection Time: 05/04/22 12:22 AM   Specimen: BLOOD LEFT HAND  Result Value Ref Range Status   Specimen Description BLOOD LEFT HAND  Final   Special Requests    Final    BOTTLES DRAWN AEROBIC AND ANAEROBIC Blood Culture results may not be optimal due to an inadequate volume of blood received in culture bottles   Culture   Final    NO GROWTH 5 DAYS Performed at [facility], [place] [address], [place], [place] [address]    Report Status 05/09/2022 FINAL  Final  Resp Panel by RT-PCR (Flu A&B, Covid) Anterior Nasal Swab     Status: Abnormal   Collection Time: 05/04/22 12:31 AM   Specimen: Anterior Nasal Swab  Result Value Ref Range Status  SARS Coronavirus 2 by RT PCR POSITIVE (A) NEGATIVE Final    Comment: (NOTE) SARS-CoV-2 target nucleic acids are DETECTED.  The SARS-CoV-2 RNA is generally detectable in upper respiratory specimens during the acute phase of infection. Positive results are indicative of the presence of the identified virus, but do not rule out bacterial infection or co-infection with other pathogens not detected by the test. Clinical correlation with patient history and other diagnostic information is necessary to determine patient infection status. The expected result is Negative.  Fact Sheet for Patients: EntrepreneurPulse.com.au  Fact Sheet for Healthcare Providers: IncredibleEmployment.be  This test is not yet approved or cleared by the [place] FDA and  has been authorized for detection and/or diagnosis of SARS-CoV-2 by FDA under an Emergency Use Authorization (EUA).  This EUA will remain in effect (meaning this test can be used) for the duration of  the COVID-19 declaration under Section 564(b)(1) of the A ct, 21 U.S.C. section 360bbb-3(b)(1), unless the authorization is terminated or revoked sooner.     Influenza A by PCR NEGATIVE NEGATIVE Final   Influenza B by PCR NEGATIVE NEGATIVE Final    Comment: (NOTE) The Xpert Xpress SARS-CoV-2/FLU/RSV plus assay is intended as an aid in the diagnosis of influenza from Nasopharyngeal swab specimens and should not be used as a  sole basis for treatment. Nasal washings and aspirates are unacceptable for Xpert Xpress SARS-CoV-2/FLU/RSV testing.  Fact Sheet for Patients: EntrepreneurPulse.com.au  Fact Sheet for Healthcare Providers: IncredibleEmployment.be  This test is not yet approved or cleared by the [place] FDA and has been authorized for detection and/or diagnosis of SARS-CoV-2 by FDA under an Emergency Use Authorization (EUA). This EUA will remain in effect (meaning this test can be used) for the duration of the COVID-19 declaration under Section 564(b)(1) of the Act, 21 U.S.C. section 360bbb-3(b)(1), unless the authorization is terminated or revoked.  Performed at [facility], [place] [address] [place], [place] [address]   Urine Culture     Status: Abnormal   Collection Time: 05/04/22  2:30 AM   Specimen: In/Out Cath Urine  Result Value Ref Range Status   Specimen Description IN/OUT CATH URINE  Final   Special Requests NONE  Final   Culture (A)  Final    40,000 COLONIES/mL DIPHTHEROIDS(CORYNEBACTERIUM SPECIES) 20,000 COLONIES/mL ENTEROCOCCUS FAECALIS Standardized susceptibility testing for this organism is not available. FOR DIPHTHEROIDES Performed at [facility], [place] [address] [place], [place] [address]    Report Status 05/08/2022 FINAL  Final   Organism ID, Bacteria ENTEROCOCCUS FAECALIS (A)  Final      Susceptibility   Enterococcus faecalis - MIC*    AMPICILLIN <=2 SENSITIVE Sensitive     NITROFURANTOIN <=16 SENSITIVE Sensitive     VANCOMYCIN 1 SENSITIVE Sensitive     * 20,000 COLONIES/mL ENTEROCOCCUS FAECALIS  Respiratory (~20 pathogens) panel by PCR     Status: None   Collection Time: 05/04/22  3:35 AM   Specimen: Nasopharyngeal Swab; Respiratory  Result Value Ref Range Status   Adenovirus NOT DETECTED NOT DETECTED Final   Coronavirus 229E NOT DETECTED NOT DETECTED Final    Comment: (NOTE) The Coronavirus on the Respiratory  Panel, DOES NOT test for the novel  Coronavirus (2019 nCoV)    Coronavirus HKU1 NOT DETECTED NOT DETECTED Final   Coronavirus NL63 NOT DETECTED NOT DETECTED Final   Coronavirus OC43 NOT DETECTED NOT DETECTED Final   Metapneumovirus NOT DETECTED NOT DETECTED Final   Rhinovirus / Enterovirus NOT  DETECTED NOT DETECTED Final   Influenza A NOT DETECTED NOT DETECTED Final   Influenza B NOT DETECTED NOT DETECTED Final   Parainfluenza Virus 1 NOT DETECTED NOT DETECTED Final   Parainfluenza Virus 2 NOT DETECTED NOT DETECTED Final   Parainfluenza Virus 3 NOT DETECTED NOT DETECTED Final   Parainfluenza Virus 4 NOT DETECTED NOT DETECTED Final   Respiratory Syncytial Virus NOT DETECTED NOT DETECTED Final   Bordetella pertussis NOT DETECTED NOT DETECTED Final   Bordetella Parapertussis NOT DETECTED NOT DETECTED Final   Chlamydophila pneumoniae NOT DETECTED NOT DETECTED Final   Mycoplasma pneumoniae NOT DETECTED NOT DETECTED Final    Comment: Performed at [facility], [place] [address] [place], [place] [address]    Labs: CBC: Recent Labs  Lab 05/03/22 2249 05/05/22 0813 05/06/22 0345 05/07/22 0257  WBC 11.2* 8.8 5.1 5.0  NEUTROABS 8.0*  --   --   --   HGB 12.7 11.2* 11.9* 11.8*  HCT 37.5 34.0* 36.0 36.2  MCV 92.8 94.7 93.3 92.8  PLT 143* 130* 130* 388*   Basic Metabolic Panel: Recent Labs  Lab 05/03/22 2249 05/05/22 0813 05/06/22 0345 05/07/22 0257  NA 143 145 144 143  K 3.9 3.7 3.6 4.3  CL 110 111 107 107  CO2 _0 GLUCOSE 135* 100* 83 97  BUN _1 CREATININE 1.19* 0.91 0.84 1.03*  CALCIUM 9.0 9.1 9.0 9.1  MG  --   --   --  2.0   Liver Function Tests: Recent Labs  Lab 05/03/22 2249 05/05/22 0813 05/06/22 0345 05/07/22 0257  AST 44* 47* 48* 39  ALT _2 ALKPHOS 68 50 54 52  BILITOT 0.4 0.3 0.6 0.4  PROT 6.6 6.2* 6.5 6.3*  ALBUMIN 3.1* 2.8* 2.9* 2.8*   CBG: Recent Labs  Lab 05/07/22 1638 05/07/22 2132 05/08/22 0922 05/08/22 1149  05/08/22 1743  GLUCAP 150* 104* 105* 119* 142*    Discharge time spent: greater than 30 minutes.  Signed: Estill Cotta, MD [facility] 05/09/2022

## 2022-07-25 ENCOUNTER — Emergency Department (HOSPITAL_COMMUNITY): Payer: Medicare Other

## 2022-07-25 ENCOUNTER — Other Ambulatory Visit: Payer: Self-pay

## 2022-07-25 ENCOUNTER — Emergency Department (HOSPITAL_COMMUNITY)
Admission: EM | Admit: 2022-07-25 | Discharge: 2022-07-25 | Disposition: A | Discharge status: Skilled Nursing Facility | Payer: Medicare Other | Attending: Emergency Medicine | Admitting: Emergency Medicine

## 2022-07-25 DIAGNOSIS — N183 Chronic kidney disease, stage 3 unspecified: Secondary | ICD-10-CM | POA: Diagnosis not present

## 2022-07-25 DIAGNOSIS — M79601 Pain in right arm: Secondary | ICD-10-CM | POA: Insufficient documentation

## 2022-07-25 DIAGNOSIS — Z79890 Hormone replacement therapy (postmenopausal): Secondary | ICD-10-CM | POA: Diagnosis not present

## 2022-07-25 DIAGNOSIS — W010XXA Fall on same level from slipping, tripping and stumbling without subsequent striking against object, initial encounter: Secondary | ICD-10-CM | POA: Diagnosis not present

## 2022-07-25 DIAGNOSIS — I129 Hypertensive chronic kidney disease with stage 1 through stage 4 chronic kidney disease, or unspecified chronic kidney disease: Secondary | ICD-10-CM | POA: Diagnosis not present

## 2022-07-25 DIAGNOSIS — E039 Hypothyroidism, unspecified: Secondary | ICD-10-CM | POA: Diagnosis not present

## 2022-07-25 DIAGNOSIS — R519 Headache, unspecified: Secondary | ICD-10-CM | POA: Insufficient documentation

## 2022-07-25 DIAGNOSIS — Z7982 Long term (current) use of aspirin: Secondary | ICD-10-CM | POA: Diagnosis not present

## 2022-07-25 DIAGNOSIS — W19XXXA Unspecified fall, initial encounter: Secondary | ICD-10-CM

## 2022-07-25 DIAGNOSIS — Z79899 Other long term (current) drug therapy: Secondary | ICD-10-CM | POA: Diagnosis not present

## 2022-07-25 DIAGNOSIS — E1122 Type 2 diabetes mellitus with diabetic chronic kidney disease: Secondary | ICD-10-CM | POA: Diagnosis not present

## 2022-07-25 DIAGNOSIS — F0390 Unspecified dementia without behavioral disturbance: Secondary | ICD-10-CM | POA: Diagnosis not present

## 2022-07-25 NOTE — ED Provider Notes (Signed)
Care of patient assumed from Dr. Dina Rich.  This patient with history of dementia had a fall out of bed at her skilled nursing facility.  Currently awaiting imaging studies.  She has no external signs of trauma.  If negative, she can be discharged. Physical Exam  BP (!) 138/54   Pulse (!) 58   Temp 98.4 F (36.9 C) (Axillary)   SpO2 98%   Physical Exam Vitals and nursing note reviewed. Exam conducted with a chaperone present.  Constitutional:      General: She is not in acute distress.    Appearance: Normal appearance. She is well-developed. She is not ill-appearing, toxic-appearing or diaphoretic.  HENT:     Head: Normocephalic and atraumatic.     Right Ear: External ear normal.     Left Ear: External ear normal.     Nose: Nose normal.     Mouth/Throat:     Mouth: Mucous membranes are moist.  Eyes:     Extraocular Movements: Extraocular movements intact.     Conjunctiva/sclera: Conjunctivae normal.  Cardiovascular:     Rate and Rhythm: Normal rate.  Pulmonary:     Effort: Pulmonary effort is normal. No respiratory distress.  Abdominal:     General: There is no distension.     Palpations: Abdomen is soft.  Genitourinary:    Comments: Soft stool only in rectum. Musculoskeletal:        General: No swelling. Normal range of motion.     Cervical back: Normal range of motion and neck supple.  Skin:    General: Skin is warm and dry.     Capillary Refill: Capillary refill takes less than 2 seconds.     Coloration: Skin is not jaundiced or pale.  Neurological:     General: No focal deficit present.     Mental Status: She is alert. Mental status is at baseline.     Cranial Nerves: No cranial nerve deficit.     Sensory: No sensory deficit.     Motor: No weakness.     Coordination: Coordination normal.  Psychiatric:        Mood and Affect: Mood normal.        Behavior: Behavior normal.        Thought Content: Thought content normal.        Judgment: Judgment normal.      Procedures  Procedures  ED Course / MDM    Medical Decision Making Amount and/or Complexity of Data Reviewed Radiology: ordered.   On assessment, patient sleeping comfortably on ED stretcher.  She is easily awakened.  Imaging studies do not show any acute injuries.  On pelvic x-ray, there were findings of possible fecal impaction.  DRE was performed with nurse chaperone present.  Patient has soft stool in rectal vault only.  Patient was discharged in stable condition.       Godfrey Pick, MD 07/25/22 [phone]

## 2022-07-25 NOTE — ED Provider Notes (Signed)
[facility] Provider Note   CSN: [id number] Arrival date & time: 07/25/22  3220     History  Chief Complaint  Patient presents with   Fall    Kelly [name] is a 88 y.o. female.  HPI     This is an 88 year old female who presents from [place] with concern for a fall.  Per staff she rolled out of bed.  She is at her baseline and is only oriented to herself per report.  She is complaining of right arm pain and head pain.  She did not have loss of consciousness.  She is not on any blood thinners.  Per staff she was given Ativan at around 4:30 AM.  Level 5 caveat  Home Medications Prior to Admission medications   Medication Sig Start Date End Date Taking? Authorizing Provider  acetaminophen (TYLENOL) 325 MG tablet Take 650 mg by mouth every 6 (six) hours as needed.    [provider]  albuterol (VENTOLIN HFA) 108 (90 Base) MCG/ACT inhaler Inhale 2 puffs into the lungs every 6 (six) hours as needed for wheezing or shortness of breath. 05/09/22   Rai, Vernelle Emerald, MD  [employer] Apply 2 drops to eye in the morning and at bedtime. *Both eyes*    [provider]  aspirin EC 81 MG tablet Take 81 mg by mouth daily.     [provider]  atorvastatin (LIPITOR) 40 MG tablet Take 40 mg by mouth at bedtime.     [provider]  bisacodyl (DULCOLAX) 10 MG suppository Place 10 mg rectally daily as needed for mild constipation or moderate constipation. If not relieved by MOM, give 10 mg Bisacodyl suppositiory rectally X 1 dose in 24 hours as needed (Do not use constipation standing orders for residents with renal failure/CFR less than 30. Contact MD for orders) (Physician Order)    [provider]  Calcium Carb-Cholecalciferol (CALCIUM-VITAMIN D3) 600-400 MG-UNIT TABS Take 1 tablet by mouth 2 (two) times daily.    [provider]  carbamide peroxide (DEBROX) 6.5 % OTIC solution Place 2-3  drops into the right ear See admin instructions. 2-3 drops to right ear 3 times a week until next audiology appointment    [provider]  cholecalciferol (VITAMIN D3) 25 MCG (1000 UNIT) tablet Take 2,000 Units by mouth daily. Take 2 tablet (2000 unit total) by mouth one time daily for vitamin deficiency    [provider]  divalproex (DEPAKOTE SPRINKLE) 125 MG capsule Take 125 mg by mouth in the morning, at noon, in the evening, and at bedtime.    [provider]  feeding supplement (BOOST HIGH PROTEIN) LIQD Take 1 Container by mouth daily. IF ENSURE IS UNAVAILABLE    [provider]  furosemide (LASIX) 20 MG tablet Take 20 mg by mouth every Monday, Wednesday, and Friday.     [provider]  levothyroxine (SYNTHROID) 137 MCG tablet Take 137 mcg by mouth daily before breakfast.    [provider]  magnesium hydroxide (MILK OF MAGNESIA) 400 MG/5ML suspension Take 30 mLs by mouth daily as needed for mild constipation or moderate constipation. If no BM in 3 days, give 30 cc Milk of Magnesium p.o. x 1 dose in 24 hours as needed (Do not use standing constipation orders for residents with renal failure CFR less than 30. Contact MD for orders) (Physician Order)    [provider]  memantine (NAMENDA) 10 MG tablet  Take 10 mg by mouth 2 (two) times daily.     [provider]  Multiple Vitamin (MULTI-VITAMIN DAILY PO) Take 1 tablet by mouth daily.    [provider]  NON FORMULARY Apply 1 application  topically in the morning, at noon, and at bedtime. Ativan 0.5mg /ml topical Gel    [provider]  Nutritional Supplements (ENSURE ENLIVE PO) Take 8 fluid ounces by mouth daily. ENSURE ENLIVE LIQUID GIVE 8 OUNCE BY MOUTH ONCE DAILY FOR SUPPLEMENT    [provider]  Nutritional Supplements (NUTRITIONAL SUPPLEMENT PO) Take 1 each by mouth daily. Magic Cup to help stabilize weight    [provider]  QUEtiapine  (SEROQUEL) 50 MG tablet Take 50 mg by mouth 2 (two) times daily.    [provider]  sennosides-docusate sodium (SENOKOT-S) 8.6-50 MG tablet Take 2 tablets by mouth daily.     [provider]  Skin Protectants, Misc. (MINERIN) CREA Apply 1 Application topically in the morning and at bedtime. Apply topically to ankles twice a day for dry skin.    [provider]  Sodium Phosphates (RA SALINE ENEMA RE) Place 1 enema rectally daily as needed (Constipation). If not relieved by Biscodyl suppository, give disposable Saline Enema rectally X 1 dose/24 hrs as needed (Do not use constipation standing orders for residents with renal failure/CFR less than 30. Contact MD for orders)(Physician Or    [provider]  valbenazine (INGREZZA) 80 MG capsule Take 80 mg by mouth daily.    [provider]      Allergies    Patient has no known allergies.    Review of Systems   Review of Systems  Unable to perform ROS: Dementia    Physical Exam Updated Vital Signs BP (!) 138/54   Pulse (!) 58   Temp 98.4 F (36.9 C) (Axillary)   SpO2 98%  Physical Exam Vitals and nursing note reviewed.  Constitutional:      Appearance: She is well-developed. She is not ill-appearing.     Comments: Elderly, chronically ill-appearing, ABCs intact  HENT:     Head: Normocephalic and atraumatic.     Mouth/Throat:     Mouth: Mucous membranes are moist.  Eyes:     Pupils: Pupils are equal, round, and reactive to light.  Cardiovascular:     Rate and Rhythm: Normal rate and regular rhythm.     Heart sounds: Normal heart sounds.  Pulmonary:     Effort: Pulmonary effort is normal. No respiratory distress.  Chest:     Chest wall: No tenderness.  Abdominal:     Palpations: Abdomen is soft.  Musculoskeletal:     Cervical back: Neck supple.     Comments: Will not allow me to fully range her arms or legs, states "you just get out of here."  No obvious deformities  Skin:    General:  Skin is warm and dry.  Neurological:     Mental Status: She is alert.     Comments: Patient will not answer orientation questions for me, continually asked for me to get out of the room  Psychiatric:     Comments: Uncooperative     ED Results / Procedures / Treatments   Labs (all labs ordered are listed, but only abnormal results are displayed) Labs Reviewed - No data to display  EKG None  Radiology No results found.  Procedures Procedures    Medications Ordered in ED Medications - No data to display  ED  Course/ Medical Decision Making/ A&P                             Medical Decision Making Amount and/or Complexity of Data Reviewed Radiology: ordered.   This patient presents to the ED for concern of fall, this involves an extensive number of treatment options, and is a complaint that carries with it a high risk of complications and morbidity.  I considered the following differential and admission for this acute, potentially life threatening condition.  The differential diagnosis includes acute injury such as head injury, fracture, rib fracture, pelvic fracture  MDM:    This is an 88 year old female who presents after reported fall.  Reportedly at her baseline.  Will not answer orientation questions for me.  She has no obvious external traumatic injuries.  Imaging ordered.  (Labs, imaging, consults)  Labs: I Ordered, and personally interpreted labs.  The pertinent results include: None  Imaging Studies ordered: I ordered imaging studies including ordered I independently visualized and interpreted imaging. I agree with the radiologist interpretation  Additional history obtained from chart review, PTAR.  External records from outside source obtained and reviewed including prior evaluations  Cardiac Monitoring: The patient was maintained on a cardiac monitor.  If on the cardiac monitor, I personally viewed and interpreted the cardiac monitored which showed an  underlying rhythm of: Sinus rhythm  Reevaluation: After the interventions noted above, I reevaluated the patient and found that they have :stayed the same  Social Determinants of Health:  lives at [facility]  Disposition: Pending, patient signed out to oncoming provider  Co morbidities that complicate the patient evaluation  Past Medical History:  Diagnosis Date   Chronic kidney disease    Stage III   Dementia with behavioral disturbance ([place]) 03/19/2015   Notated on FL2 Form from Dr. Agapito Games [phone]   Depression    previously saw Dr Casimiro Needle   Diabetes mellitus without complication ([place])    08/22/62 A1c 6%   Dyslipidemia    11/18/15 Tg 74, HDL 60, LDL 90 on statin   Fracture of radial head, closed 05/12/2021   05/09/2021 elbow films revealed possible subtle transversely oriented fracture left radial head.  Effusion and soft tissue edema noted over the olecranon.   Hypertension    Hypothyroidism    11/18/15 TSH 53.6, 02/07/16 TSH 9.42 @ [facility]   MDD (major depressive disorder)    With behavioral disturbance   Scabies    02/21/16 [facility],[place]; Rx: Permethrin cream   Urinary tract infection due to Proteus 05/04/2019   Uniformly sensitive except to nitrofurantoin; Cipro prescribed   Vitamin D deficiency    11/18/15 vitamin D 25-hydroxy 39     Medicines No orders of the defined types were placed in this encounter.   I have reviewed the patients home medicines and have made adjustments as needed  Problem List / ED Course: Problem List Items Addressed This Visit   None               Final Clinical Impression(s) / ED Diagnoses Final diagnoses:  None    Rx / DC Orders ED Discharge Orders     None         Merryl Hacker, MD 07/25/22 [phone]

## 2022-07-25 NOTE — ED Triage Notes (Signed)
Pt bib PTAR from [facility] staff stated pt rolled off the bed and hit her head. No loss of consciousness. A&O x1 and is baseline. Pt also complaining of right arm pain and head pain. Pt not on thinners ativan was given at 0425. Ems vitals 110/66  99% RA 55hr 17 rr

## 2022-07-25 NOTE — Discharge Instructions (Addendum)
Your imaging studies did not show any new injuries.  Take stool softeners to maintain daily soft stools.

## 2022-11-17 ENCOUNTER — Other Ambulatory Visit: Payer: Self-pay

## 2022-11-17 ENCOUNTER — Encounter (HOSPITAL_COMMUNITY): Payer: Self-pay | Admitting: Emergency Medicine

## 2022-11-17 ENCOUNTER — Emergency Department (HOSPITAL_COMMUNITY)
Admission: EM | Admit: 2022-11-17 | Discharge: 2022-11-17 | Disposition: A | Discharge status: Home / Self Care | Payer: Medicare Other | Attending: Emergency Medicine | Admitting: Emergency Medicine

## 2022-11-17 ENCOUNTER — Emergency Department (HOSPITAL_COMMUNITY): Payer: Medicare Other

## 2022-11-17 DIAGNOSIS — E1122 Type 2 diabetes mellitus with diabetic chronic kidney disease: Secondary | ICD-10-CM | POA: Insufficient documentation

## 2022-11-17 DIAGNOSIS — S0083XA Contusion of other part of head, initial encounter: Secondary | ICD-10-CM | POA: Insufficient documentation

## 2022-11-17 DIAGNOSIS — Z79899 Other long term (current) drug therapy: Secondary | ICD-10-CM | POA: Diagnosis not present

## 2022-11-17 DIAGNOSIS — F0390 Unspecified dementia without behavioral disturbance: Secondary | ICD-10-CM | POA: Insufficient documentation

## 2022-11-17 DIAGNOSIS — W19XXXA Unspecified fall, initial encounter: Secondary | ICD-10-CM | POA: Insufficient documentation

## 2022-11-17 DIAGNOSIS — N189 Chronic kidney disease, unspecified: Secondary | ICD-10-CM | POA: Insufficient documentation

## 2022-11-17 DIAGNOSIS — I129 Hypertensive chronic kidney disease with stage 1 through stage 4 chronic kidney disease, or unspecified chronic kidney disease: Secondary | ICD-10-CM | POA: Insufficient documentation

## 2022-11-17 DIAGNOSIS — Z7982 Long term (current) use of aspirin: Secondary | ICD-10-CM | POA: Diagnosis not present

## 2022-11-17 NOTE — ED Triage Notes (Signed)
Pt to ER via EMS from [place] with c/o unwitnessed fall.  Facility denies LOC, pt received her normal ativan PTA.  Pt with contusion to left eye.  Pt has dementia and is disoriented at baseline.  Pt has hx of aggressive behavior.  C-collar in place.

## 2022-11-17 NOTE — ED Provider Notes (Signed)
[facility] EMERGENCY DEPARTMENT AT [facility] Provider Note   CSN: [id number] Arrival date & time: 11/17/22  1344     History  Chief Complaint  Patient presents with   Fall    Kelly [name] is a 88 y.o. female.  Patient is an 88 year old female with a history of hypertension, diabetes, CKD, dementia with behavioral disturbance, prior UTIs who is presenting today from a facility after an unwitnessed fall.  Facility reports that patient is disoriented at baseline and received her normal Ativan about 30 minutes prior to arrival.  They noticed bruising over her left eye but otherwise did not feel that she was significantly different than baseline.  The history is provided by the EMS personnel and medical records. The history is limited by the condition of the patient.  Fall       Home Medications Prior to Admission medications   Medication Sig Start Date End Date Taking? Authorizing Provider  acetaminophen (TYLENOL) 325 MG tablet Take 650 mg by mouth every 6 (six) hours as needed.    [provider]  albuterol (VENTOLIN HFA) 108 (90 Base) MCG/ACT inhaler Inhale 2 puffs into the lungs every 6 (six) hours as needed for wheezing or shortness of breath. 05/09/22   Rai, Delene Ruffini, MD  [employer] Apply 2 drops to eye in the morning and at bedtime. *Both eyes*    [provider]  aspirin EC 81 MG tablet Take 81 mg by mouth daily.     [provider]  atorvastatin (LIPITOR) 40 MG tablet Take 40 mg by mouth at bedtime.     [provider]  bisacodyl (DULCOLAX) 10 MG suppository Place 10 mg rectally daily as needed for mild constipation or moderate constipation. If not relieved by MOM, give 10 mg Bisacodyl suppositiory rectally X 1 dose in 24 hours as needed (Do not use constipation standing orders for residents with renal failure/CFR less than 30. Contact MD for orders) (Physician Order)    [provider]  Calcium  Carb-Cholecalciferol (CALCIUM-VITAMIN D3) 600-400 MG-UNIT TABS Take 1 tablet by mouth 2 (two) times daily.    [provider]  carbamide peroxide (DEBROX) 6.5 % OTIC solution Place 2-3 drops into the right ear See admin instructions. 2-3 drops to right ear 3 times a week until next audiology appointment    [provider]  cholecalciferol (VITAMIN D3) 25 MCG (1000 UNIT) tablet Take 2,000 Units by mouth daily. Take 2 tablet (2000 unit total) by mouth one time daily for vitamin deficiency    [provider]  divalproex (DEPAKOTE SPRINKLE) 125 MG capsule Take 125 mg by mouth in the morning, at noon, in the evening, and at bedtime.    [provider]  feeding supplement (BOOST HIGH PROTEIN) LIQD Take 1 Container by mouth daily. IF ENSURE IS UNAVAILABLE    [provider]  furosemide (LASIX) 20 MG tablet Take 20 mg by mouth every Monday, Wednesday, and Friday.     [provider]  levothyroxine (SYNTHROID) 137 MCG tablet Take 137 mcg by mouth daily before breakfast.    [provider]  magnesium hydroxide (MILK OF MAGNESIA) 400 MG/5ML suspension Take 30 mLs by mouth daily as needed for mild constipation or moderate constipation. If no BM in 3 days, give 30 cc Milk of Magnesium p.o. x 1 dose in 24 hours as needed (Do not use standing constipation orders for residents with renal failure CFR less than 30. Contact MD for  orders) (Physician Order)    [provider]  memantine (NAMENDA) 10 MG tablet Take 10 mg by mouth 2 (two) times daily.     [provider]  Multiple Vitamin (MULTI-VITAMIN DAILY PO) Take 1 tablet by mouth daily.    [provider]  NON FORMULARY Apply 1 application  topically in the morning, at noon, and at bedtime. Ativan 0.5mg /ml topical Gel    [provider]  Nutritional Supplements (ENSURE ENLIVE PO) Take 8 fluid ounces by mouth daily. ENSURE ENLIVE LIQUID GIVE 8 OUNCE BY MOUTH ONCE DAILY FOR  SUPPLEMENT    [provider]  Nutritional Supplements (NUTRITIONAL SUPPLEMENT PO) Take 1 each by mouth daily. Magic Cup to help stabilize weight    [provider]  QUEtiapine (SEROQUEL) 50 MG tablet Take 50 mg by mouth 2 (two) times daily.    [provider]  sennosides-docusate sodium (SENOKOT-S) 8.6-50 MG tablet Take 2 tablets by mouth daily.     [provider]  Skin Protectants, Misc. (MINERIN) CREA Apply 1 Application topically in the morning and at bedtime. Apply topically to ankles twice a day for dry skin.    [provider]  Sodium Phosphates (RA SALINE ENEMA RE) Place 1 enema rectally daily as needed (Constipation). If not relieved by Biscodyl suppository, give disposable Saline Enema rectally X 1 dose/24 hrs as needed (Do not use constipation standing orders for residents with renal failure/CFR less than 30. Contact MD for orders)(Physician Or    [provider]  valbenazine (INGREZZA) 80 MG capsule Take 80 mg by mouth daily.    [provider]      Allergies    Patient has no known allergies.    Review of Systems   Review of Systems  Physical Exam Updated Vital Signs BP (!) 107/48   Pulse 88   Temp 97.7 F (36.5 C) (Oral)   Resp 18   Ht 5\' 9"  (1.753 m)   Wt 80 kg   SpO2 95%   BMI 26.05 kg/m  Physical Exam Constitutional:      Comments: Sleeping and will not participate with exam.  When trying to open the patient's eyes she forces them closed  HENT:     Head: Normocephalic. Contusion present.   Eyes:     Pupils: Pupils are equal, round, and reactive to light.  Neck:     Comments: C-collar in place Musculoskeletal:     Cervical back: Neck supple.  Skin:    General: Skin is warm and dry.  Neurological:     Mental Status: Mental status is at baseline.     Comments: Curled up in the side of the bed with her arms and legs held in tight will not allow any movement of them  Psychiatric:     Comments:  Refusing to participate with exam     ED Results / Procedures / Treatments   Labs (all labs ordered are listed, but only abnormal results are displayed) Labs Reviewed - No data to display  EKG None  Radiology CT Head Wo Contrast  Result Date: 11/17/2022 CLINICAL DATA:  Neck trauma (Age >= 65y); Head trauma, minor (Age >= 65y) EXAM: CT HEAD WITHOUT CONTRAST CT CERVICAL SPINE WITHOUT CONTRAST TECHNIQUE: Multidetector CT imaging of the head and cervical spine was performed following the standard protocol without intravenous contrast. Multiplanar CT image reconstructions of the cervical spine were also generated. RADIATION DOSE REDUCTION: This exam was performed according to the departmental dose-optimization program  which includes automated exposure control, adjustment of the mA and/or kV according to patient size and/or use of iterative reconstruction technique. COMPARISON:  07/25/2022 FINDINGS: CT HEAD FINDINGS Brain: No evidence of acute infarction, hemorrhage, hydrocephalus, extra-axial collection or mass lesion/mass effect. Scattered low-density changes within the periventricular and subcortical white matter most compatible with chronic microvascular ischemic change. Mild diffuse cerebral volume loss. Vascular: Atherosclerotic calcifications involving the large vessels of the skull base. No unexpected hyperdense vessel. Skull: Normal. Negative for fracture or focal lesion. Sinuses/Orbits: No acute finding. Other: None. CT CERVICAL SPINE FINDINGS Alignment: Facet joints are aligned without dislocation or traumatic listhesis. Dens and lateral masses are aligned. Skull base and vertebrae: No acute fracture. No primary bone lesion or focal pathologic process. Soft tissues and spinal canal: No prevertebral fluid or swelling. No visible canal hematoma. Disc levels:  Degenerative disc disease most pronounced at C5-6. Upper chest: Negative. Other: Bilateral carotid atherosclerosis. IMPRESSION: 1. No acute  intracranial abnormality. 2. No acute fracture or subluxation of the cervical spine. Electronically Signed   By: Duanne Guess D.O.   On: 11/17/2022 15:12   CT Cervical Spine Wo Contrast  Result Date: 11/17/2022 CLINICAL DATA:  Neck trauma (Age >= 65y); Head trauma, minor (Age >= 65y) EXAM: CT HEAD WITHOUT CONTRAST CT CERVICAL SPINE WITHOUT CONTRAST TECHNIQUE: Multidetector CT imaging of the head and cervical spine was performed following the standard protocol without intravenous contrast. Multiplanar CT image reconstructions of the cervical spine were also generated. RADIATION DOSE REDUCTION: This exam was performed according to the departmental dose-optimization program which includes automated exposure control, adjustment of the mA and/or kV according to patient size and/or use of iterative reconstruction technique. COMPARISON:  07/25/2022 FINDINGS: CT HEAD FINDINGS Brain: No evidence of acute infarction, hemorrhage, hydrocephalus, extra-axial collection or mass lesion/mass effect. Scattered low-density changes within the periventricular and subcortical white matter most compatible with chronic microvascular ischemic change. Mild diffuse cerebral volume loss. Vascular: Atherosclerotic calcifications involving the large vessels of the skull base. No unexpected hyperdense vessel. Skull: Normal. Negative for fracture or focal lesion. Sinuses/Orbits: No acute finding. Other: None. CT CERVICAL SPINE FINDINGS Alignment: Facet joints are aligned without dislocation or traumatic listhesis. Dens and lateral masses are aligned. Skull base and vertebrae: No acute fracture. No primary bone lesion or focal pathologic process. Soft tissues and spinal canal: No prevertebral fluid or swelling. No visible canal hematoma. Disc levels:  Degenerative disc disease most pronounced at C5-6. Upper chest: Negative. Other: Bilateral carotid atherosclerosis. IMPRESSION: 1. No acute intracranial abnormality. 2. No acute fracture or  subluxation of the cervical spine. Electronically Signed   By: Duanne Guess D.O.   On: 11/17/2022 15:12    Procedures Procedures    Medications Ordered in ED Medications - No data to display  ED Course/ Medical Decision Making/ A&P                             Medical Decision Making Amount and/or Complexity of Data Reviewed Independent Historian: EMS    Details: facility Radiology: ordered and independent interpretation performed. Decision-making details documented in ED Course.   Elderly female with history of dementia coming in today after an unwitnessed fall.  This was 30 minutes after taking her normal Ativan.  Patient will not cooperate with exam at this time.  She is otherwise in no acute distress.  She does not take any anticoagulation.  Signs of trauma to the left eyebrow.  4:44  PM I have independently visualized and interpreted pt's images today.  CT head is neg for intracranial bleed. And c-spine is cleared.  Pt appears to be a baseline and was d/ced back to facility.         Final Clinical Impression(s) / ED Diagnoses Final diagnoses:  Fall, initial encounter  Contusion of face, initial encounter    Rx / DC Orders ED Discharge Orders     None         Gwyneth Sprout, MD 11/17/22 1645

## 2022-11-17 NOTE — Discharge Instructions (Signed)
CAT scan was normal today.  No signs of internal injury.  Tylenol every 6 hours as needed.

## 2022-11-17 NOTE — ED Notes (Signed)
Attempted to call report, no answer on pt hall, message left with receptionist.

## 2023-03-19 IMAGING — DX DG ELBOW COMPLETE 3+V*L*
4 series · 4 of 4 positions shown · non-contrast
Comparison: None.

CLINICAL DATA: Fall, pain

EXAM:
LEFT ELBOW - COMPLETE 3+ VIEW

[x elbow lat left]
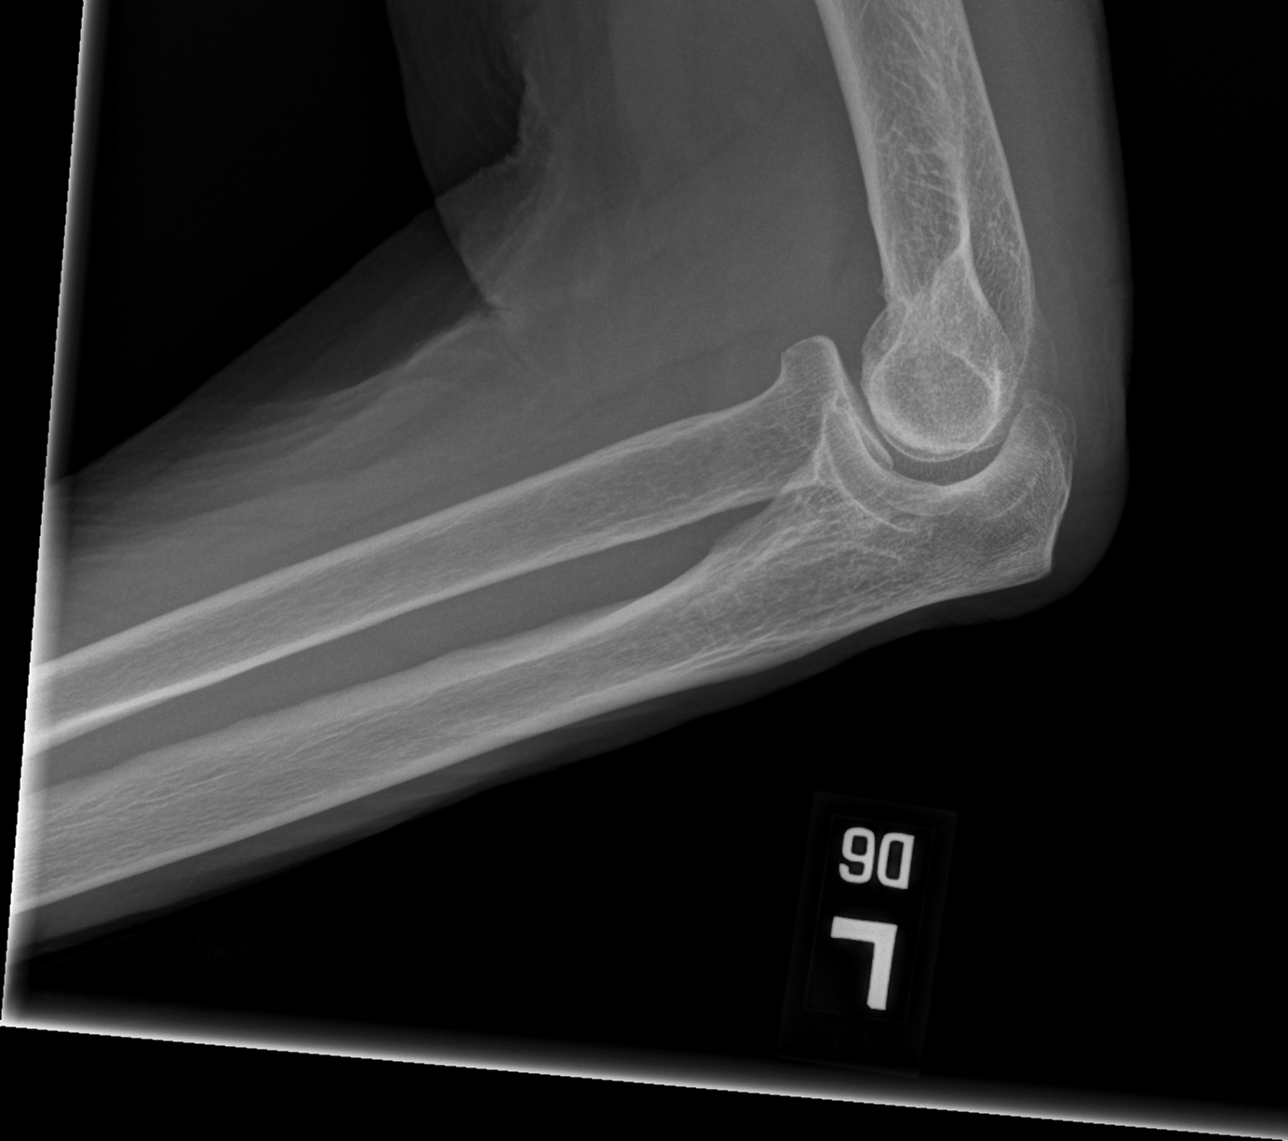

[x elbow ap left]
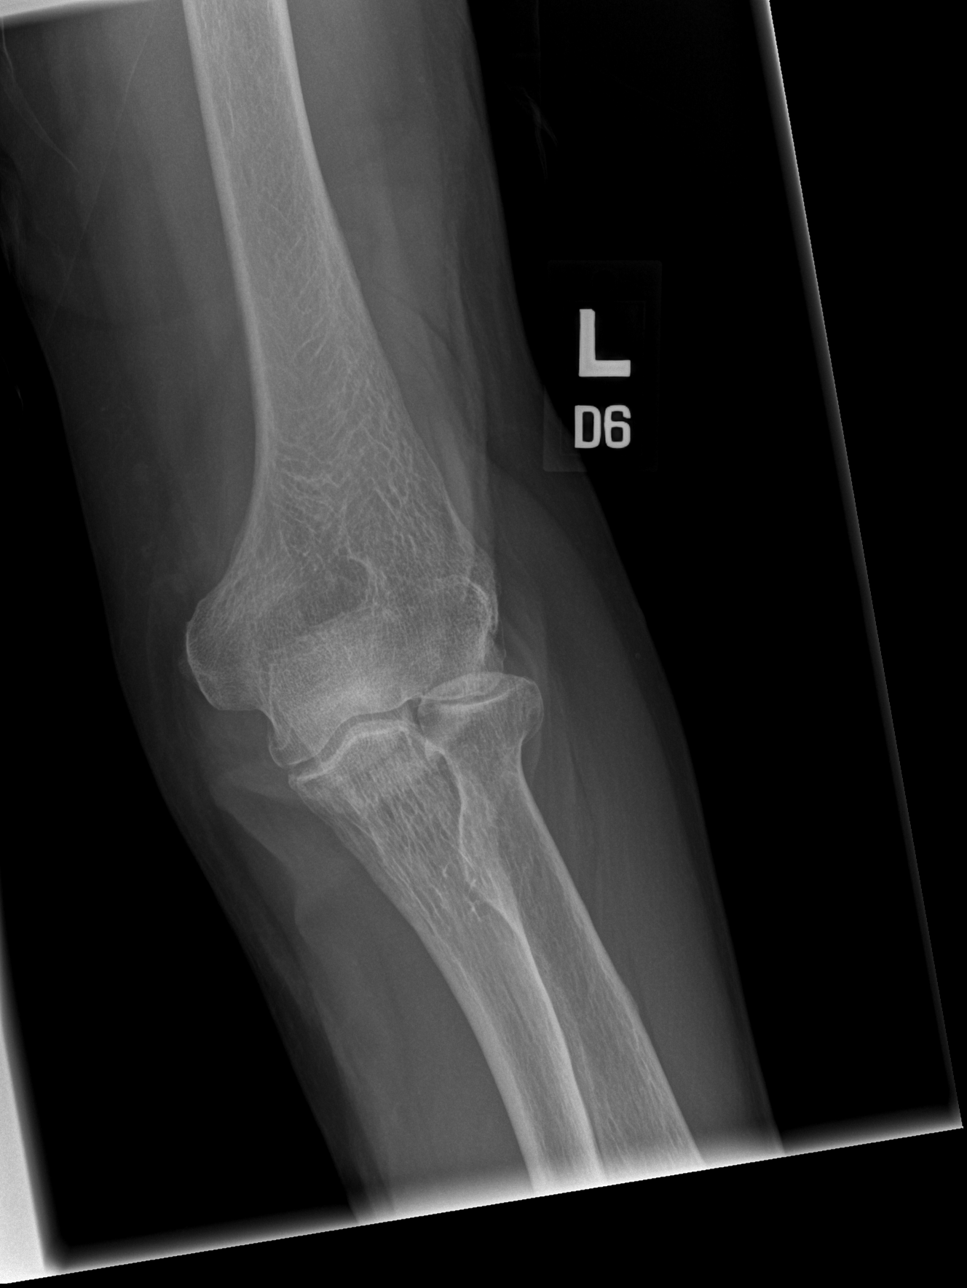

[x elbow obl left (1 of 2)]
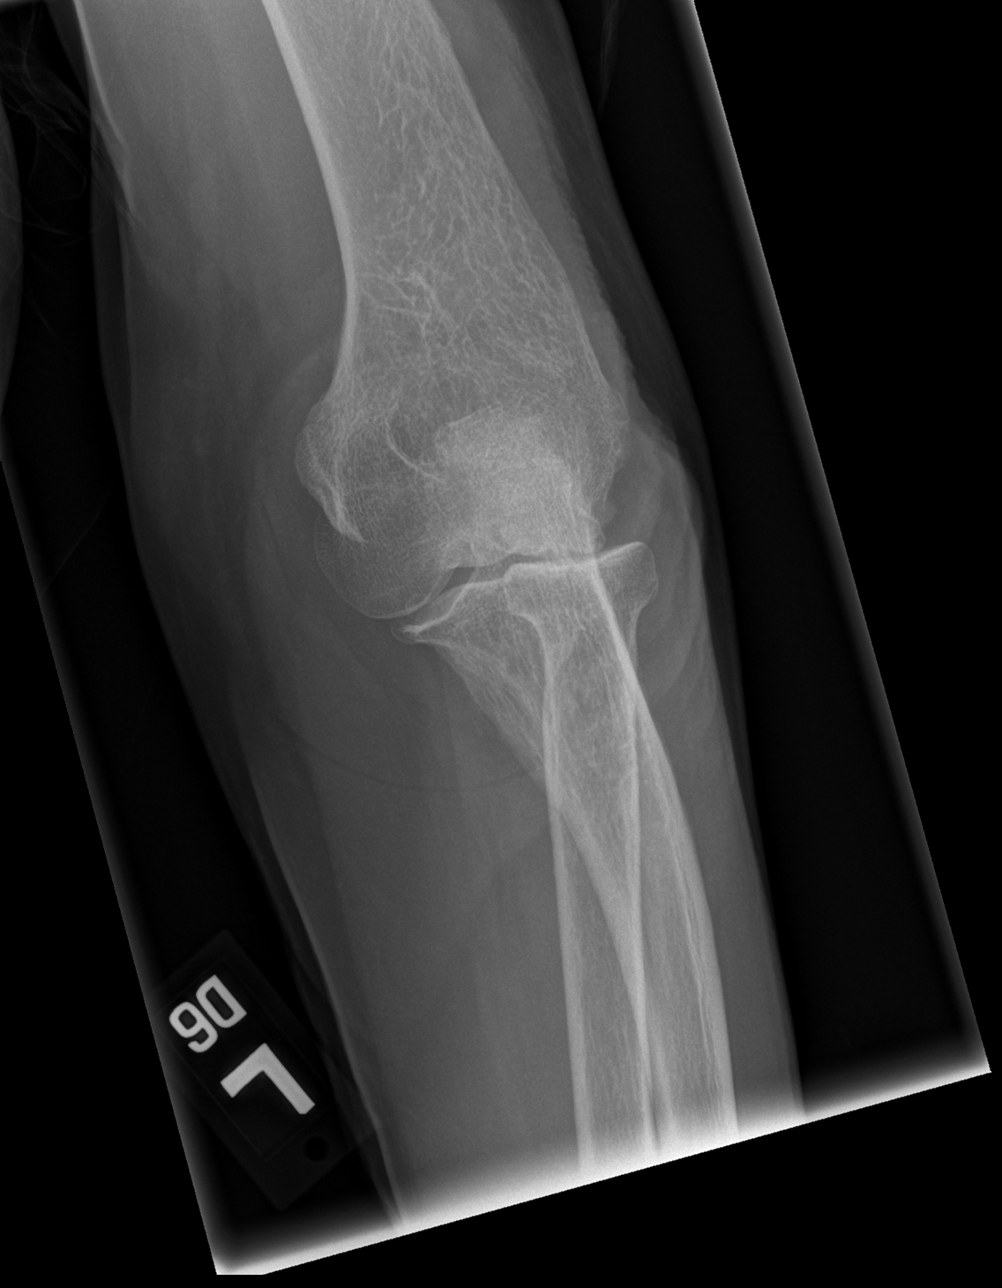

[x elbow obl left (2 of 2)]
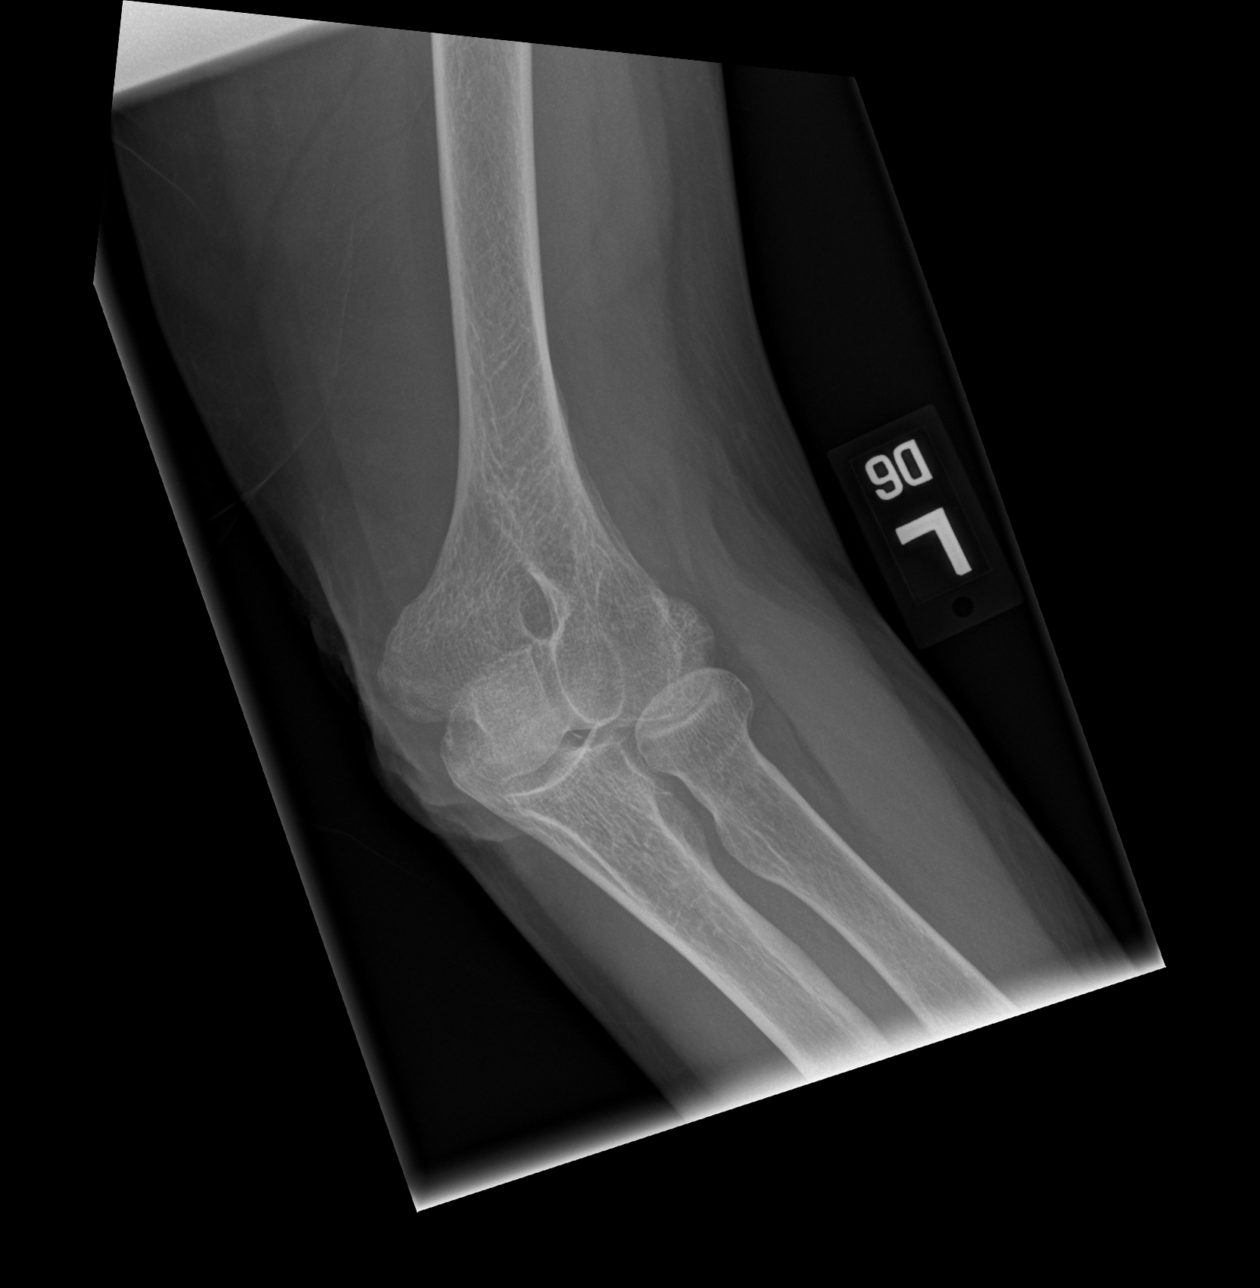

[4 of 4 positions shown; findings below may reference images not displayed]

FINDINGS: Suspect a very subtle transversely oriented fracture of the left
radial head. No definite fracture. There is a large elbow joint
effusion. Soft tissue edema overlying the olecranon.
IMPRESSION: 1. Suspect a very subtle transversely oriented fracture of the left
radial head. There is however no definite fracture.

2. Large elbow joint effusion, in general strongly associated with
radiographically occult fractures of the radial head and neck.

3.  Soft tissue edema overlying the olecranon.

## 2023-03-19 IMAGING — CT CT HEAD W/O CM
4 series · 15 of 47 positions shown, 17 images · non-contrast
Comparison: Brain MRI 03/10/2016.

CLINICAL DATA: Head trauma, minor. Neck trauma. Additional history
provided: Recent falls.

EXAM:
CT HEAD WITHOUT CONTRAST
CT CERVICAL SPINE WITHOUT CONTRAST
TECHNIQUE: Multidetector CT imaging of the head and cervical spine was
performed following the standard protocol without intravenous
contrast. Multiplanar CT image reconstructions of the cervical spine
were also generated.

[Series 3: head without · axial · non-contrast · 0.43mm/px · z∈[-82,+38]mm · 7 of 34 slices shown, 9 images]
[im 5/34  brain]
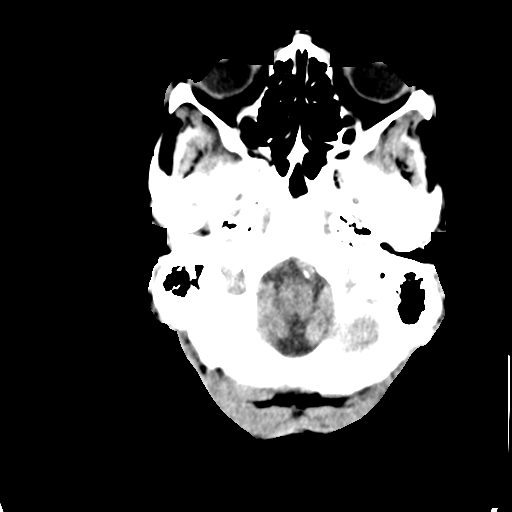
[im 5/34  bone]
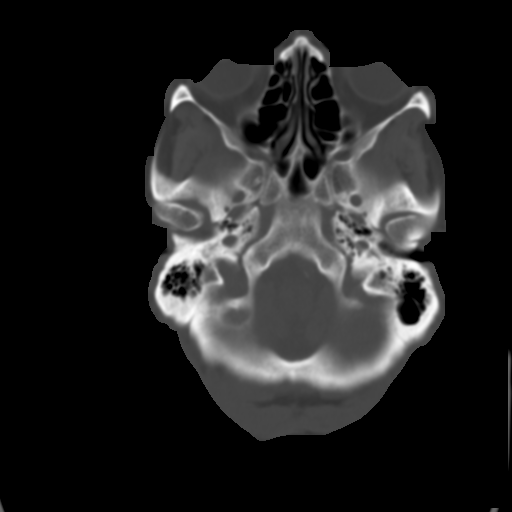
[im 9/34  brain]
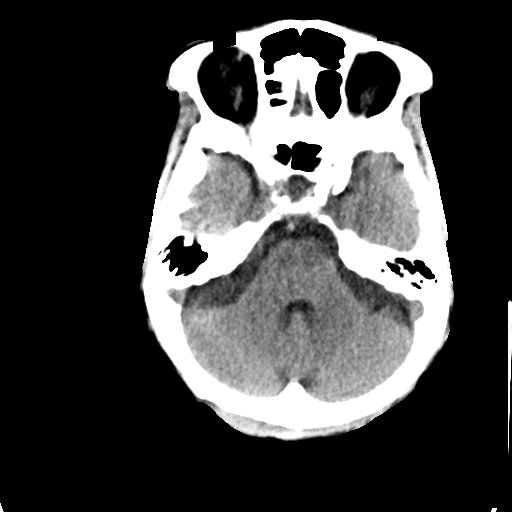
[im 13/34  brain]
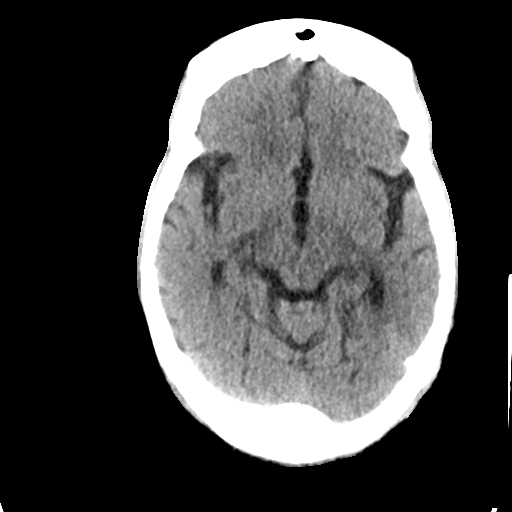
[im 17/34  brain]
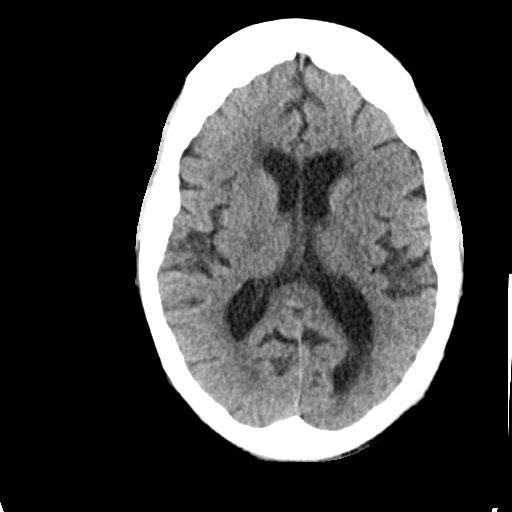
[im 21/34  brain]
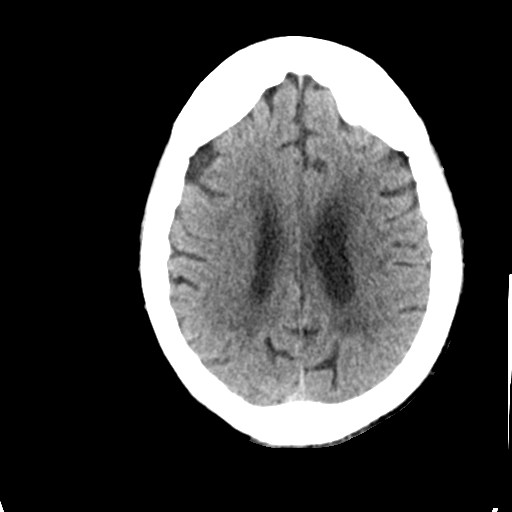
[im 21/34  bone]
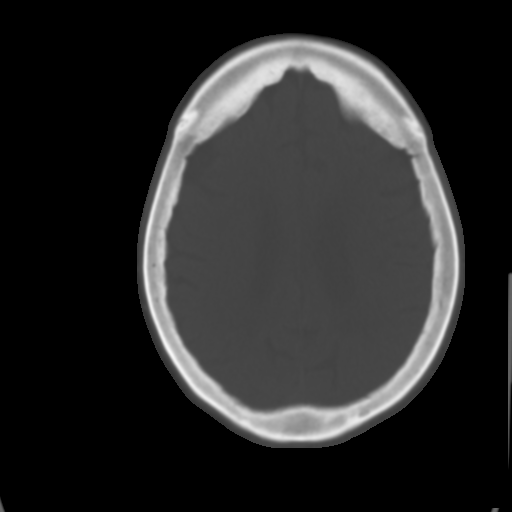
[im 25/34  brain]
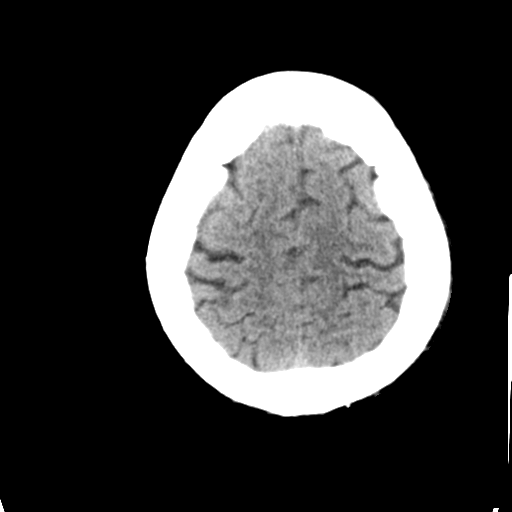
[im 29/34  brain]
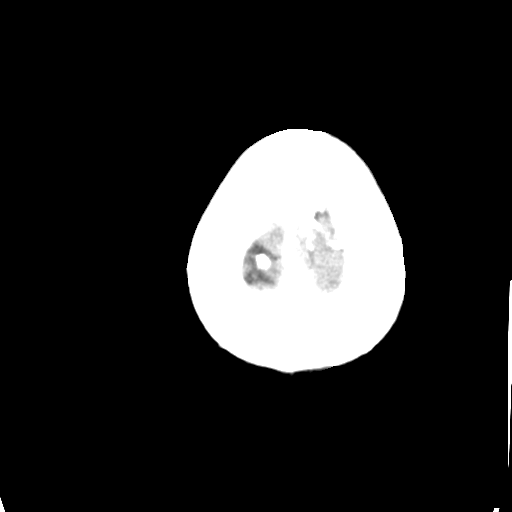

[Series 4: head bone · axial · 0.43mm/px · z∈[-86,-70]mm · 2 of 84 slices shown]
[im 9/84  bone]
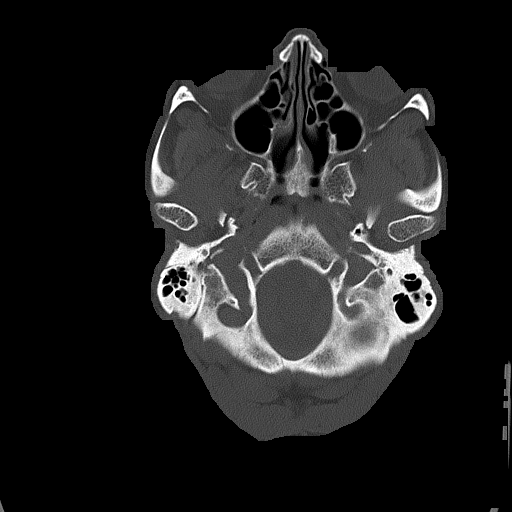
[im 17/84  bone]
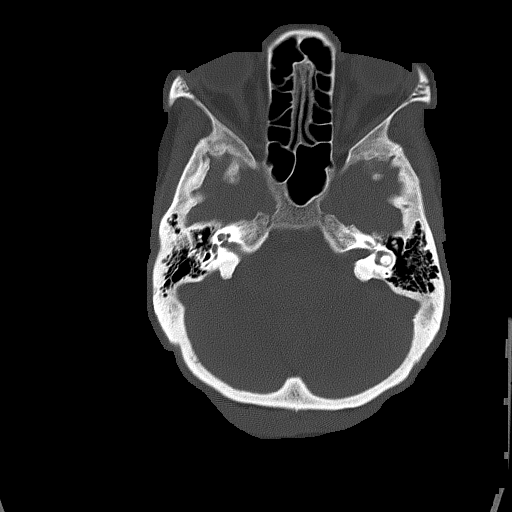

[Series 5: head without cor · coronal · non-contrast · 0.32mm/px · 3 of 69 slices shown]
[im 23/69  brain]
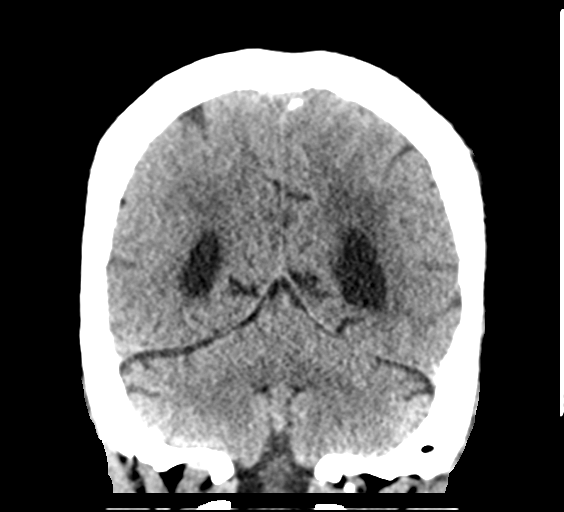
[im 31/69  brain]
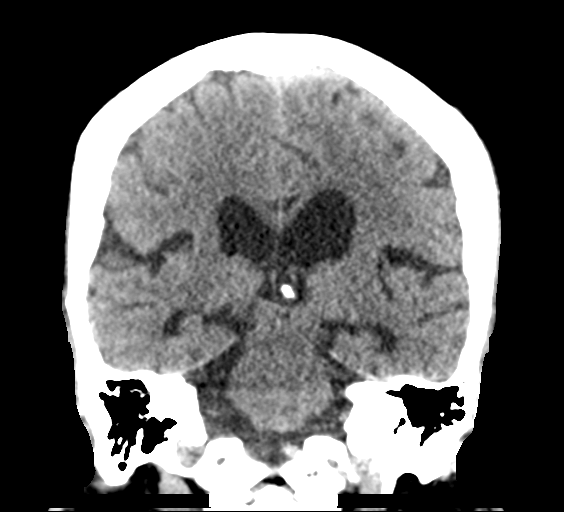
[im 38/69  brain]
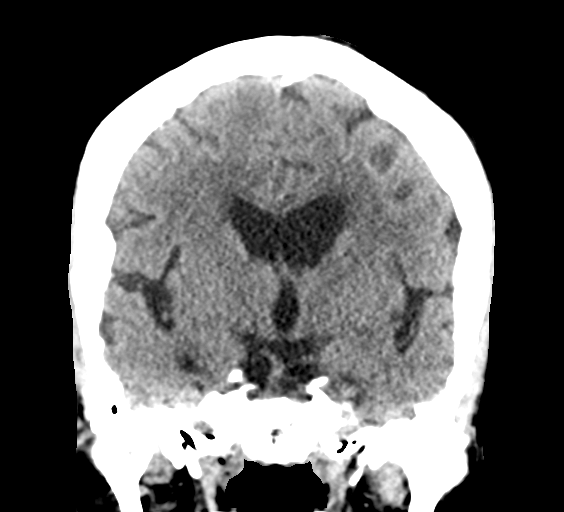

[Series 6: head without sag · sagittal · non-contrast · 0.32mm/px · 3 of 56 slices shown]
[im 19/56  brain]
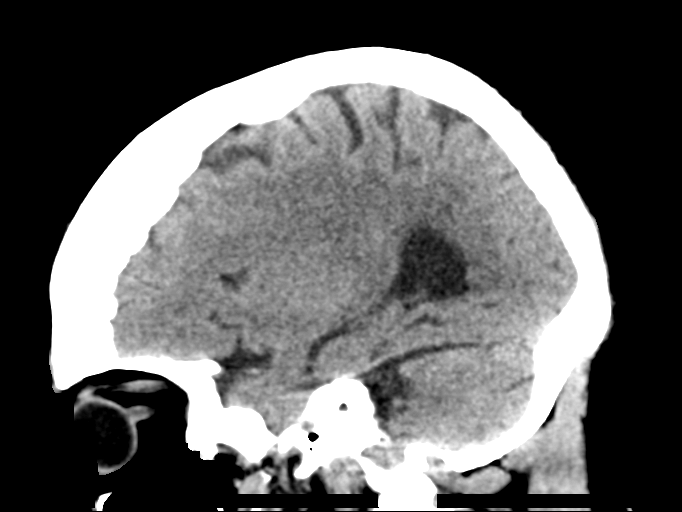
[im 28/56  brain]
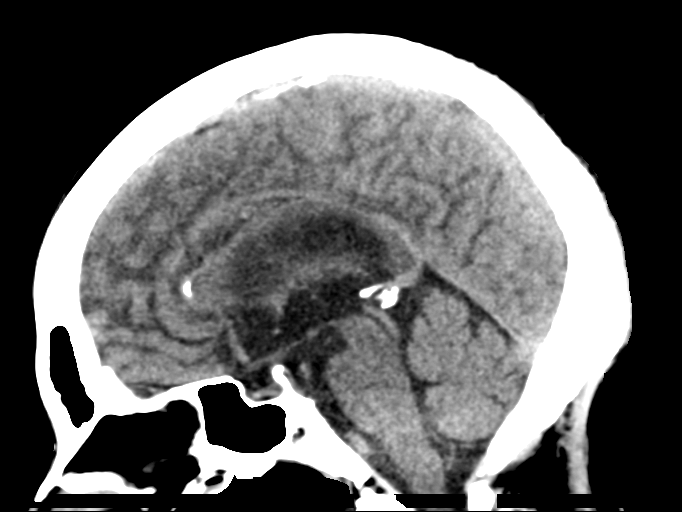
[im 37/56  brain]
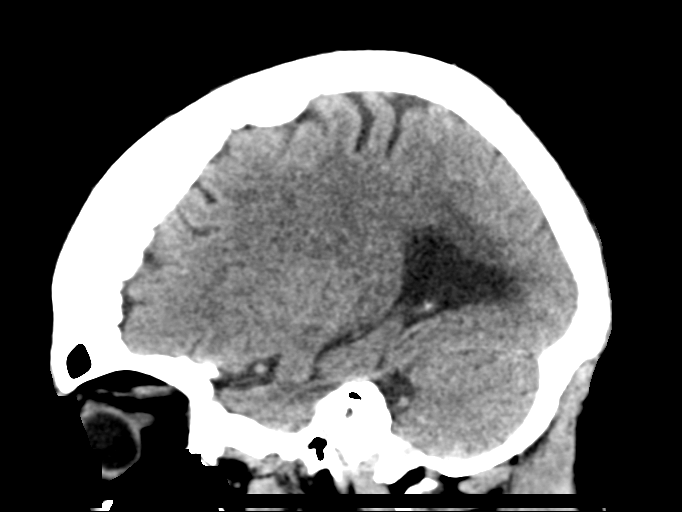

[15 of 47 positions shown; findings below may reference images not displayed]

FINDINGS: CT HEAD FINDINGS

Brain:

Mild generalized cerebral and cerebellar atrophy.

Mild patchy and ill-defined hypoattenuation within the cerebral
white matter, nonspecific but compatible chronic small vessel
ischemic disease.

There is no acute intracranial hemorrhage.

No demarcated cortical infarct.

No extra-axial fluid collection.

No evidence of an intracranial mass.

No midline shift.

Vascular: No hyperdense vessel.  Atherosclerotic calcifications

Skull: Normal. Negative for fracture or focal lesion.

Sinuses/Orbits: Visualized orbits show no acute finding. No
significant paranasal sinus disease at the imaged levels.

Other: Subtle right periorbital soft tissue swelling is questioned.

CT CERVICAL SPINE FINDINGS

Alignment: Reversal of the expected cervical lordosis. No
significant spondylolisthesis.

Skull base and vertebrae: The basion-dental and atlanto-dental
intervals are maintained.No evidence of acute fracture to the
cervical spine.

Soft tissues and spinal canal: No prevertebral fluid or swelling. No
visible canal hematoma.

Disc levels: Cervical spondylosis. Most notably at C5-C6, there is
advanced disc space narrowing with a posterior disc osteophyte
complex and bilateral uncovertebral hypertrophy. No appreciable
high-grade spinal canal stenosis. Bony neural foraminal narrowing
bilaterally at C6-C7.

Upper chest: No consolidation within the imaged lung apices. No
visible pneumothorax. Incidentally noted azygos fissure (anatomic
variant).
IMPRESSION: CT head:

1. No evidence of acute intracranial abnormality.
2. Mild chronic small-vessel ischemic changes within the cerebral
white matter.
3. Mild generalized parenchymal atrophy.
4. Subtle right periorbital soft tissue swelling is questioned.
Correlate with findings on direct examination.

CT cervical spine:

1. No evidence of acute fracture to the cervical spine.
2. Reversal of the expected cervical lordosis.
3. Cervical spondylosis, as described and greatest at C5-C6 and
C6-C7.

## 2023-03-19 IMAGING — DX DG CHEST 1V PORT
1 series · 1 of 1 positions shown · non-contrast
Comparison: Portable exam 6493 hours without priors for comparison

CLINICAL DATA: Fall

EXAM:
PORTABLE CHEST 1 VIEW

[chest]
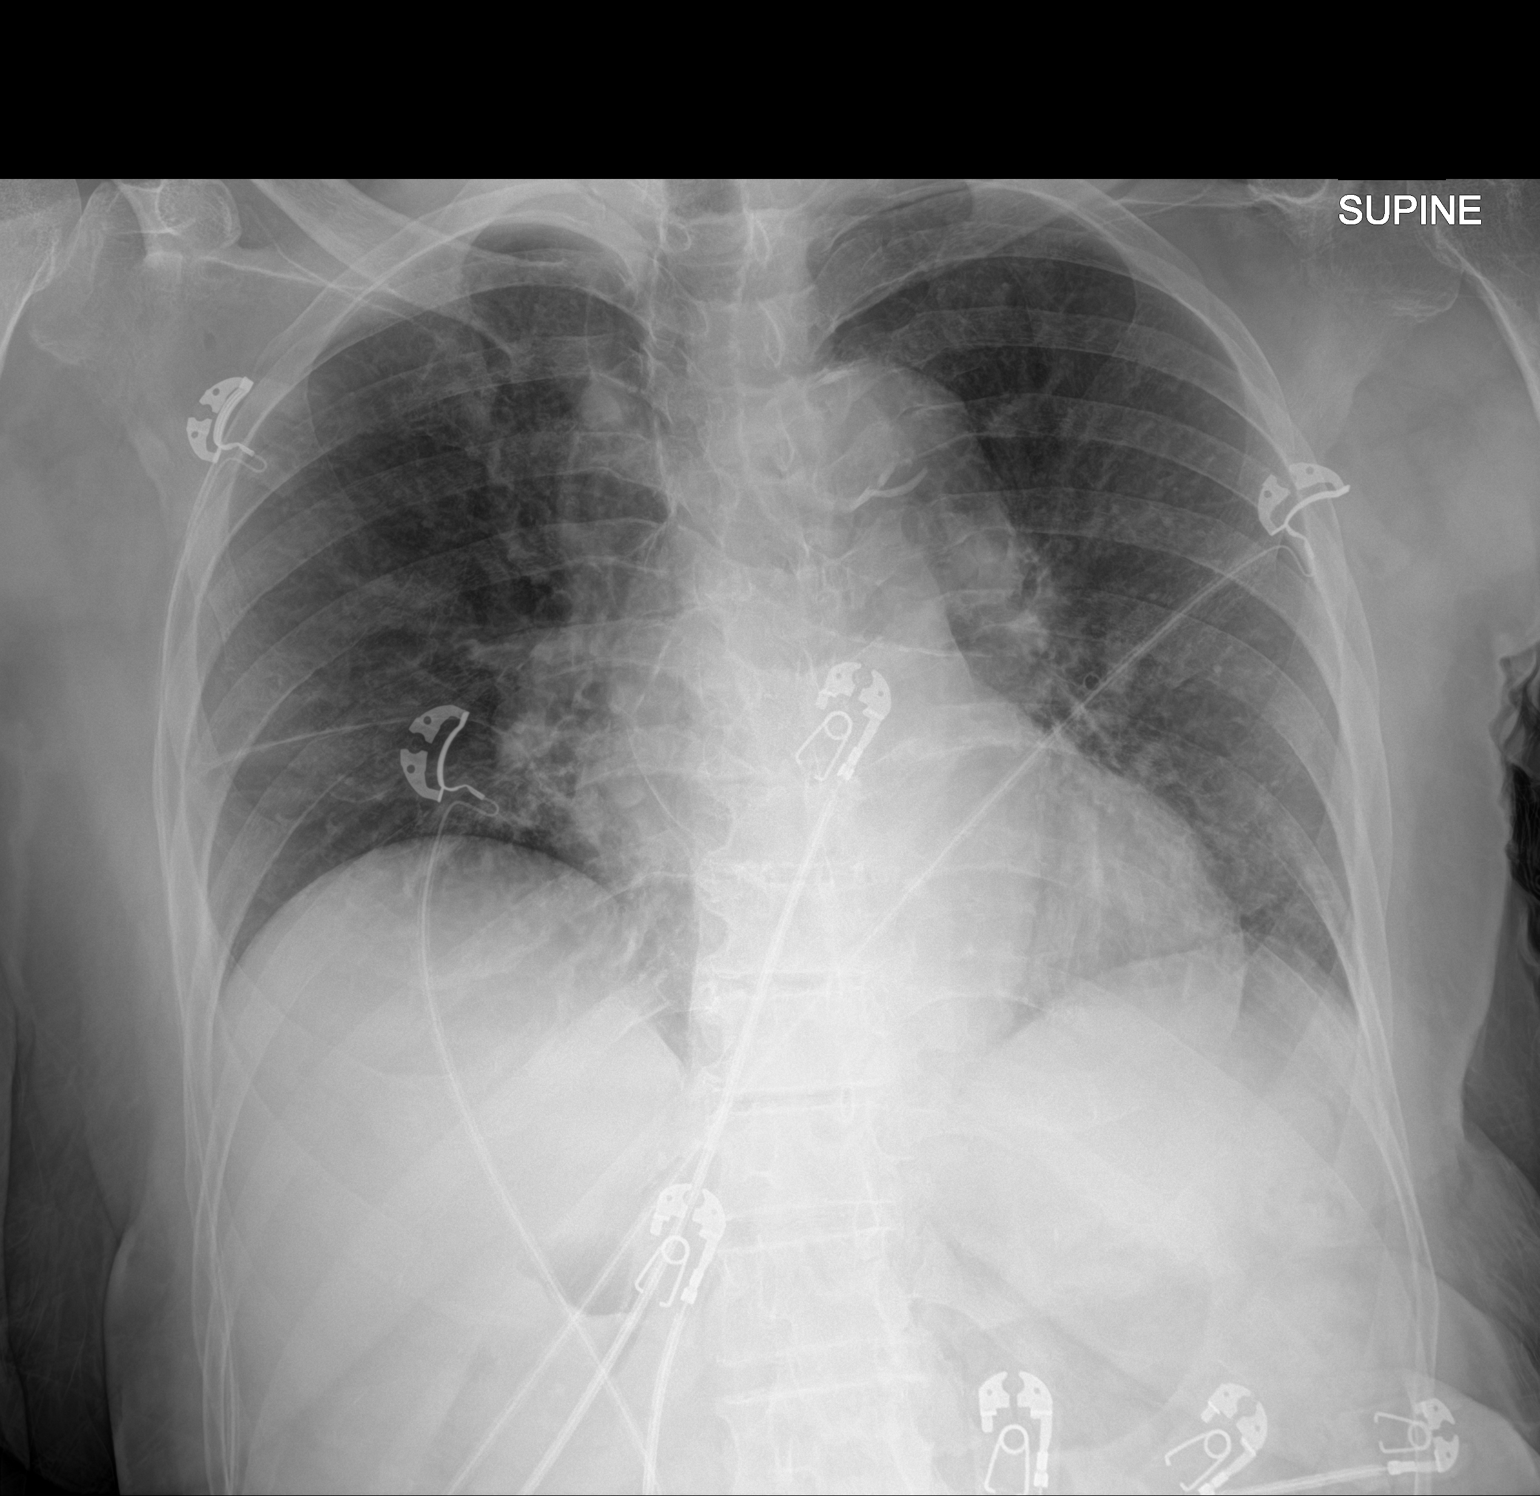

[1 of 1 positions shown; findings below may reference images not displayed]

FINDINGS: Normal heart size and pulmonary vascularity.

Atherosclerotic calcification and tortuosity of thoracic aorta.

Bronchitic changes with RIGHT basilar atelectasis.

Remaining lungs clear.

No infiltrate, pleural effusion, or pneumothorax.

As this fissure noted.

No acute osseous findings.
IMPRESSION: Bronchitic changes with RIGHT basilar atelectasis.

Aortic Atherosclerosis (5DOM3-SX4.4).

## 2023-05-29 ENCOUNTER — Emergency Department (HOSPITAL_COMMUNITY): Payer: Medicare Other

## 2023-05-29 ENCOUNTER — Emergency Department (HOSPITAL_COMMUNITY)
Admission: EM | Admit: 2023-05-29 | Discharge: 2023-05-29 | Disposition: A | Discharge status: Home / Self Care | Payer: Medicare Other | Attending: Emergency Medicine | Admitting: Emergency Medicine

## 2023-05-29 DIAGNOSIS — F0390 Unspecified dementia without behavioral disturbance: Secondary | ICD-10-CM | POA: Insufficient documentation

## 2023-05-29 DIAGNOSIS — N390 Urinary tract infection, site not specified: Secondary | ICD-10-CM | POA: Diagnosis not present

## 2023-05-29 DIAGNOSIS — R569 Unspecified convulsions: Secondary | ICD-10-CM | POA: Diagnosis present

## 2023-05-29 DIAGNOSIS — Z7982 Long term (current) use of aspirin: Secondary | ICD-10-CM | POA: Diagnosis not present

## 2023-05-29 DIAGNOSIS — R4182 Altered mental status, unspecified: Secondary | ICD-10-CM | POA: Diagnosis not present

## 2023-05-29 LAB — COMPREHENSIVE METABOLIC PANEL
ALT: 14 U/L (ref 0–44)
AST: 23 U/L (ref 15–41)
Albumin: 3.3 g/dL — ABNORMAL LOW (ref 3.5–5.0)
Alkaline Phosphatase: 66 U/L (ref 38–126)
Anion gap: 9 (ref 5–15)
BUN: 15 mg/dL (ref 8–23)
CO2: 26 mmol/L (ref 22–32)
Calcium: 9.3 mg/dL (ref 8.9–10.3)
Chloride: 106 mmol/L (ref 98–111)
Creatinine, Ser: 0.84 mg/dL (ref 0.44–1.00)
GFR, Estimated: >60 mL/min (ref >60)
Glucose, Bld: 97 mg/dL (ref 70–99)
Potassium: 3.9 mmol/L (ref 3.5–5.1)
Sodium: 141 mmol/L (ref 135–145)
Total Bilirubin: 0.6 mg/dL (ref <1.2)
Total Protein: 6.1 g/dL — ABNORMAL LOW (ref 6.5–8.1)

## 2023-05-29 LAB — APTT: aPTT: 32 s (ref 24–36)

## 2023-05-29 LAB — URINALYSIS, W/ REFLEX TO CULTURE (INFECTION SUSPECTED)
Bilirubin Urine: NEGATIVE
Glucose, UA: NEGATIVE mg/dL
Ketones, ur: NEGATIVE mg/dL
Nitrite: POSITIVE — AB
Protein, ur: 100 mg/dL — AB
Specific Gravity, Urine: 1.023 (ref 1.005–1.030)
WBC, UA: >50 WBC/hpf (ref 0–5)
pH: 5 (ref 5.0–8.0)

## 2023-05-29 LAB — I-STAT CG4 LACTIC ACID, ED
Lactic Acid, Venous: 1.8 mmol/L (ref 0.5–1.9)
Lactic Acid, Venous: 1.8 mmol/L (ref 0.5–1.9)

## 2023-05-29 LAB — CBC WITH DIFFERENTIAL/PLATELET
Abs Immature Granulocytes: 0.04 10*3/uL (ref 0.00–0.07)
Basophils Absolute: 0 10*3/uL (ref 0.0–0.1)
Basophils Relative: 1 %
Eosinophils Absolute: 0.1 10*3/uL (ref 0.0–0.5)
Eosinophils Relative: 2 %
HCT: 40.1 % (ref 36.0–46.0)
Hemoglobin: 12.9 g/dL (ref 12.0–15.0)
Immature Granulocytes: 1 %
Lymphocytes Relative: 43 %
Lymphs Abs: 1.5 10*3/uL (ref 0.7–4.0)
MCH: 29.9 pg (ref 26.0–34.0)
MCHC: 32.2 g/dL (ref 30.0–36.0)
MCV: 93 fL (ref 80.0–100.0)
Monocytes Absolute: 0.5 10*3/uL (ref 0.1–1.0)
Monocytes Relative: 14 %
Neutro Abs: 1.4 10*3/uL — ABNORMAL LOW (ref 1.7–7.7)
Neutrophils Relative %: 39 %
Platelets: 140 10*3/uL — ABNORMAL LOW (ref 150–400)
RBC: 4.31 MIL/uL (ref 3.87–5.11)
RDW: 14.3 % (ref 11.5–15.5)
WBC: 3.6 10*3/uL — ABNORMAL LOW (ref 4.0–10.5)
nRBC: 0 % (ref 0.0–0.2)

## 2023-05-29 LAB — PROTIME-INR
INR: 1.1 (ref 0.8–1.2)
Prothrombin Time: 14.7 s (ref 11.4–15.2)

## 2023-05-29 LAB — TROPONIN I (HIGH SENSITIVITY)
Troponin I (High Sensitivity): 12 ng/L (ref <18)
Troponin I (High Sensitivity): 29 ng/L — ABNORMAL HIGH (ref <18)

## 2023-05-29 MED ORDER — CEFUROXIME AXETIL 250 MG PO TABS: 250.0000 mg | ORAL_TABLET | Freq: Two times a day (BID) | ORAL | 0 refills | Status: DC

## 2023-05-29 MED ORDER — SODIUM CHLORIDE 0.9 % IV SOLN
2.0000 g | Freq: Once | INTRAVENOUS | Status: AC
  Administered 2023-05-29: 2 g via INTRAVENOUS
  Filled 2023-05-29: qty 20

## 2023-05-29 NOTE — ED Notes (Signed)
[facility] called; 3rd in line.

## 2023-05-29 NOTE — ED Triage Notes (Signed)
PT BIB [facility] from [place] with c/o seizure. Staff reports they heard a loud grunting sound and when they went to check on pt she was having a grand mal type seizure that lasted approximately 3 minutes. Staff reports no hx of seizures. Pt at baseline behavior per facility. Hx of dementia and non-verbal.  EMS vitals: BP 131/65 HR 72 O2 98% room air CBG 132

## 2023-05-29 NOTE — ED Notes (Signed)
Patient's daughter and nursing facility notified of discharge.

## 2023-05-29 NOTE — ED Provider Notes (Signed)
[place] [facility] AT [facility] Provider Note   CSN: [id number] Arrival date & time: 05/29/23  8416     History  Chief Complaint  Patient presents with   Seizures    Kelly [name] is a 88 y.o. female.  Patient sent to the emergency department from skilled nursing facility.  Nursing home staff heard the patient grunting and reports that she might of been having a seizure.  No known seizure disorder.  This was not witnessed by EMS, no obvious postictal state.  Patient with history of dementia, is reportedly at baseline according to nursing home staff.       Home Medications Prior to Admission medications   Medication Sig Start Date End Date Taking? Authorizing Provider  cefUROXime (CEFTIN) 250 MG tablet Take 1 tablet (250 mg total) by mouth 2 (two) times daily with a meal. 05/29/23  Yes Alexyia Guarino, Canary Brim, MD  acetaminophen (TYLENOL) 325 MG tablet Take 650 mg by mouth every 6 (six) hours as needed.    [provider]  albuterol (VENTOLIN HFA) 108 (90 Base) MCG/ACT inhaler Inhale 2 puffs into the lungs every 6 (six) hours as needed for wheezing or shortness of breath. 05/09/22   Rai, Delene Ruffini, MD  [employer] Apply 2 drops to eye in the morning and at bedtime. *Both eyes*    [provider]  aspirin EC 81 MG tablet Take 81 mg by mouth daily.     [provider]  atorvastatin (LIPITOR) 40 MG tablet Take 40 mg by mouth at bedtime.     [provider]  bisacodyl (DULCOLAX) 10 MG suppository Place 10 mg rectally daily as needed for mild constipation or moderate constipation. If not relieved by MOM, give 10 mg Bisacodyl suppositiory rectally X 1 dose in 24 hours as needed (Do not use constipation standing orders for residents with renal failure/CFR less than 30. Contact MD for orders) (Physician Order)    [provider]  Calcium Carb-Cholecalciferol (CALCIUM-VITAMIN D3) 600-400 MG-UNIT TABS Take 1  tablet by mouth 2 (two) times daily.    [provider]  carbamide peroxide (DEBROX) 6.5 % OTIC solution Place 2-3 drops into the right ear See admin instructions. 2-3 drops to right ear 3 times a week until next audiology appointment    [provider]  cholecalciferol (VITAMIN D3) 25 MCG (1000 UNIT) tablet Take 2,000 Units by mouth daily. Take 2 tablet (2000 unit total) by mouth one time daily for vitamin deficiency    [provider]  divalproex (DEPAKOTE SPRINKLE) 125 MG capsule Take 125 mg by mouth in the morning, at noon, in the evening, and at bedtime.    [provider]  feeding supplement (BOOST HIGH PROTEIN) LIQD Take 1 Container by mouth daily. IF ENSURE IS UNAVAILABLE    [provider]  furosemide (LASIX) 20 MG tablet Take 20 mg by mouth every Monday, Wednesday, and Friday.     [provider]  levothyroxine (SYNTHROID) 137 MCG tablet Take 137 mcg by mouth daily before breakfast.    [provider]  magnesium hydroxide (MILK OF MAGNESIA) 400 MG/5ML suspension Take 30 mLs by mouth daily as needed for mild constipation or moderate constipation. If no BM in 3 days, give 30 cc Milk of Magnesium p.o. x 1 dose in 24 hours as needed (Do not use standing constipation orders for residents with renal failure CFR less than 30. Contact MD for orders) (Physician Order)  [provider]  memantine (NAMENDA) 10 MG tablet Take 10 mg by mouth 2 (two) times daily.     [provider]  Multiple Vitamin (MULTI-VITAMIN DAILY PO) Take 1 tablet by mouth daily.    [provider]  NON FORMULARY Apply 1 application  topically in the morning, at noon, and at bedtime. Ativan 0.5mg /ml topical Gel    [provider]  Nutritional Supplements (ENSURE ENLIVE PO) Take 8 fluid ounces by mouth daily. ENSURE ENLIVE LIQUID GIVE 8 OUNCE BY MOUTH ONCE DAILY FOR SUPPLEMENT    [provider]  Nutritional Supplements  (NUTRITIONAL SUPPLEMENT PO) Take 1 each by mouth daily. Magic Cup to help stabilize weight    [provider]  QUEtiapine (SEROQUEL) 50 MG tablet Take 50 mg by mouth 2 (two) times daily.    [provider]  sennosides-docusate sodium (SENOKOT-S) 8.6-50 MG tablet Take 2 tablets by mouth daily.     [provider]  Skin Protectants, Misc. (MINERIN) CREA Apply 1 Application topically in the morning and at bedtime. Apply topically to ankles twice a day for dry skin.    [provider]  Sodium Phosphates (RA SALINE ENEMA RE) Place 1 enema rectally daily as needed (Constipation). If not relieved by Biscodyl suppository, give disposable Saline Enema rectally X 1 dose/24 hrs as needed (Do not use constipation standing orders for residents with renal failure/CFR less than 30. Contact MD for orders)(Physician Or    [provider]  valbenazine (INGREZZA) 80 MG capsule Take 80 mg by mouth daily.    [provider]      Allergies    Patient has no known allergies.    Review of Systems   Review of Systems  Physical Exam Updated Vital Signs BP (!) 135/91   Pulse 73   Temp (S) (!) 96.5 F (35.8 C) (Rectal)   Resp (!) 22   SpO2 96%  Physical Exam Vitals and nursing note reviewed.  Constitutional:      General: She is not in acute distress.    Appearance: She is well-developed.  HENT:     Head: Normocephalic and atraumatic.     Mouth/Throat:     Mouth: Mucous membranes are moist.  Eyes:     General: Vision grossly intact. Gaze aligned appropriately.     Extraocular Movements: Extraocular movements intact.     Conjunctiva/sclera: Conjunctivae normal.  Cardiovascular:     Rate and Rhythm: Normal rate and regular rhythm.     Pulses: Normal pulses.     Heart sounds: Normal heart sounds, S1 normal and S2 normal. No murmur heard.    No friction rub. No gallop.  Pulmonary:     Effort: Pulmonary effort is normal. No respiratory distress.      Breath sounds: Normal breath sounds.  Abdominal:     General: Bowel sounds are normal.     Palpations: Abdomen is soft.     Tenderness: There is no abdominal tenderness. There is no guarding or rebound.     Hernia: No hernia is present.  Musculoskeletal:        General: No swelling.     Cervical back: Full passive range of motion without pain, normal range of motion and neck supple. No spinous process tenderness or muscular tenderness. Normal range of motion.     Right lower leg: No edema.     Left lower leg: No edema.  Skin:    General: Skin is warm and dry.  Capillary Refill: Capillary refill takes less than 2 seconds.     Findings: No ecchymosis, erythema, rash or wound.  Neurological:     General: No focal deficit present.     Mental Status: She is alert. Mental status is at baseline.     Cranial Nerves: Cranial nerves 2-12 are intact.     Sensory: Sensation is intact.     Motor: Motor function is intact.     Coordination: Coordination is intact.  Psychiatric:        Attention and Perception: Attention normal.        Mood and Affect: Mood normal.        Speech: Speech normal.        Behavior: Behavior normal.     ED Results / Procedures / Treatments   Labs (all labs ordered are listed, but only abnormal results are displayed) Labs Reviewed  COMPREHENSIVE METABOLIC PANEL - Abnormal; Notable for the following components:      Result Value   Total Protein 6.1 (*)    Albumin 3.3 (*)    All other components within normal limits  CBC WITH DIFFERENTIAL/PLATELET - Abnormal; Notable for the following components:   WBC 3.6 (*)    Platelets 140 (*)    Neutro Abs 1.4 (*)    All other components within normal limits  URINALYSIS, W/ REFLEX TO CULTURE (INFECTION SUSPECTED) - Abnormal; Notable for the following components:   Color, Urine AMBER (*)    APPearance TURBID (*)    Hgb urine dipstick SMALL (*)    Protein, ur 100 (*)    Nitrite POSITIVE (*)    Leukocytes,Ua MODERATE  (*)    Bacteria, UA MANY (*)    All other components within normal limits  TROPONIN I (HIGH SENSITIVITY) - Abnormal; Notable for the following components:   Troponin I (High Sensitivity) 29 (*)    All other components within normal limits  CULTURE, BLOOD (ROUTINE X 2)  CULTURE, BLOOD (ROUTINE X 2)  PROTIME-INR  APTT  I-STAT CG4 LACTIC ACID, ED  I-STAT CG4 LACTIC ACID, ED  TROPONIN I (HIGH SENSITIVITY)    EKG EKG Interpretation Date/Time:  Wednesday May 29 2023 03:55:03 EST Ventricular Rate:  69 PR Interval:  164 QRS Duration:  114 QT Interval:  458 QTC Calculation: 488 R Axis:   -27  Text Interpretation: Sinus rhythm Borderline intraventricular conduction delay Borderline low voltage, extremity leads Abnormal R-wave progression, late transition Borderline prolonged QT interval Confirmed by Gilda Crease [phone]) on 05/29/2023 5:54:21 AM  Radiology DG Chest Port 1 View  Result Date: 05/29/2023 CLINICAL DATA:  Questionable sepsis EXAM: PORTABLE CHEST 1 VIEW COMPARISON:  05/03/2022 FINDINGS: Artifact from EKG leads. Hazy density at the left base where the diaphragm is elevated. Stable generous heart size, unremarkable mediastinal contours. No edema, effusion, or pneumothorax. IMPRESSION: Volume loss and opacity at the left base is stable from prior and favor scar over acute airspace disease. Recommend follow-up. Electronically Signed   By: Tiburcio Pea M.D.   On: 05/29/2023 05:00   CT HEAD WO CONTRAST ( )  Result Date: 05/29/2023 CLINICAL DATA:  3 minutes of seizure. EXAM: CT HEAD WITHOUT CONTRAST TECHNIQUE: Contiguous axial images were obtained from the base of the skull through the vertex without intravenous contrast. RADIATION DOSE REDUCTION: This exam was performed according to the departmental dose-optimization program which includes automated exposure control, adjustment of the mA and/or kV according to patient size and/or use of iterative reconstruction  technique. COMPARISON:  11/17/2022 FINDINGS: Brain: No evidence of acute infarction, hemorrhage, hydrocephalus, extra-axial collection or mass lesion/mass effect. No focal cortical finding to correlate with history of seizure Vascular: No hyperdense vessel or unexpected calcification. Skull: Normal. Negative for fracture or focal lesion.  Hyperostosis Sinuses/Orbits: Negative IMPRESSION: No acute or focal finding. Electronically Signed   By: Tiburcio Pea M.D.   On: 05/29/2023 04:36    Procedures Procedures    Medications Ordered in ED Medications  cefTRIAXone (ROCEPHIN) 2 g in sodium chloride 0.9 % 100 mL IVPB (0 g Intravenous Stopped 05/29/23 0547)    ED Course/ Medical Decision Making/ A&P                                 Medical Decision Making Amount and/or Complexity of Data Reviewed Labs: ordered. Radiology: ordered. ECG/medicine tests: ordered.   Patient presents to the emergency department from skilled nursing facility.  Patient had altered mental status tonight, nursing home staff were concerned that she might of had a seizure.  This was not independently witnessed.  She was immediately at her baseline according to nursing home staff.  No seizure activity noted here.  Does have a history of baseline dementia.  She has been alert here in the ED.  She is not febrile, rectal temp was slightly low at 96.9.  No leukocytosis.  Normal lactic acid.  No tachycardia or hypotension.  Does not meet criteria for sepsis but does have evidence of a UTI.  Treated with Rocephin 2 g IV.  She will be appropriate for discharge with continued outpatient management for UTI.        Final Clinical Impression(s) / ED Diagnoses Final diagnoses:  Altered mental status, unspecified altered mental status type  Urinary tract infection without hematuria, site unspecified    Rx / DC Orders ED Discharge Orders          Ordered    cefUROXime (CEFTIN) 250 MG tablet  2 times daily with meals         05/29/23 0710              Gilda Crease, MD 05/29/23 0710

## 2023-05-29 NOTE — ED Notes (Signed)
Patient daughter called wanting an update April [phone]

## 2023-05-31 NOTE — ED Provider Notes (Signed)
Single positive blood culture of 4 bottles for gram-positive rods. Came in with AMS but returned to baseline and overall reassuring workup at that time approximately 60 hours ago. No acute distress per all the documentation.  This is more likely to be a contaminant based on all information and patient was broadly treated with ceftriaxone and p.o. antibiotics.  No acute evidence for changes in prior develop plan. Can be followed up asynchronously.   Glyn Ade, MD 05/31/23 2121

## 2023-06-01 NOTE — ED Notes (Signed)
05/31/23 @ 2131  Provider make aware of Positive blood cultures.  He will review the chart and respond.

## 2023-06-02 LAB — CULTURE, BLOOD (ROUTINE X 2): Special Requests: ADEQUATE

## 2023-06-03 ENCOUNTER — Telehealth (HOSPITAL_BASED_OUTPATIENT_CLINIC_OR_DEPARTMENT_OTHER): Payer: Self-pay | Admitting: *Deleted

## 2023-06-03 LAB — CULTURE, BLOOD (ROUTINE X 2)
Culture: NO GROWTH
Special Requests: ADEQUATE

## 2023-06-03 NOTE — Telephone Encounter (Signed)
Post ED Visit - Positive Culture Follow-up  Culture report reviewed by antimicrobial stewardship pharmacist: [facility] [x]  Daylene Posey, Pharm.D. []  Celedonio Miyamoto, Pharm.D., BCPS AQ-ID []  Garvin Fila, Pharm.D., BCPS []  Georgina Pillion, [address].D., BCPS []  [place], [address].D., BCPS, AAHIVP []  Estella Husk, Pharm.D., BCPS, AAHIVP []  Lysle Pearl, PharmD, BCPS []  Phillips Climes, PharmD, BCPS []  Agapito Games, PharmD, BCPS []  Verlan Friends, PharmD []  Mervyn Gay, PharmD, BCPS []  Vinnie Level, PharmD  [facility] []  Len Childs, PharmD []  Greer Pickerel, PharmD []  Adalberto Cole, PharmD []  Perlie Gold, Rph []  Lonell Face) Jean Rosenthal, PharmD []  Earl Many, PharmD []  Junita Push, PharmD []  Dorna Leitz, PharmD []  Terrilee Files, PharmD []  Lynann Beaver, PharmD []  Keturah Barre, PharmD []  Loralee Pacas, PharmD []  Bernadene Person, PharmD   Positive blood culture Treated with Cefuroxime Axetil, organism sensitive to the same and no further patient follow-up is required at this time.  Virl Axe [facility] 06/03/2023, 7:35 AM

## 2023-10-10 ENCOUNTER — Inpatient Hospital Stay (HOSPITAL_COMMUNITY)
Admission: EM | Admit: 2023-10-10 | Discharge: 2023-10-13 | DRG: 871 | Disposition: A | Discharge status: Hospice | Source: Skilled Nursing Facility | Attending: Internal Medicine | Admitting: Internal Medicine

## 2023-10-10 ENCOUNTER — Encounter (HOSPITAL_COMMUNITY): Payer: Self-pay

## 2023-10-10 ENCOUNTER — Other Ambulatory Visit: Payer: Self-pay

## 2023-10-10 ENCOUNTER — Emergency Department (HOSPITAL_COMMUNITY)

## 2023-10-10 DIAGNOSIS — F02C4 Dementia in other diseases classified elsewhere, severe, with anxiety: Secondary | ICD-10-CM | POA: Diagnosis present

## 2023-10-10 DIAGNOSIS — E870 Hyperosmolality and hypernatremia: Secondary | ICD-10-CM | POA: Diagnosis present

## 2023-10-10 DIAGNOSIS — E860 Dehydration: Secondary | ICD-10-CM | POA: Diagnosis present

## 2023-10-10 DIAGNOSIS — R6520 Severe sepsis without septic shock: Secondary | ICD-10-CM | POA: Diagnosis present

## 2023-10-10 DIAGNOSIS — Z7401 Bed confinement status: Secondary | ICD-10-CM | POA: Diagnosis not present

## 2023-10-10 DIAGNOSIS — A419 Sepsis, unspecified organism: Secondary | ICD-10-CM | POA: Diagnosis not present

## 2023-10-10 DIAGNOSIS — L89214 Pressure ulcer of right hip, stage 4: Secondary | ICD-10-CM | POA: Diagnosis present

## 2023-10-10 DIAGNOSIS — Z8616 Personal history of COVID-19: Secondary | ICD-10-CM | POA: Diagnosis not present

## 2023-10-10 DIAGNOSIS — F339 Major depressive disorder, recurrent, unspecified: Secondary | ICD-10-CM | POA: Diagnosis not present

## 2023-10-10 DIAGNOSIS — E1122 Type 2 diabetes mellitus with diabetic chronic kidney disease: Secondary | ICD-10-CM | POA: Diagnosis present

## 2023-10-10 DIAGNOSIS — N179 Acute kidney failure, unspecified: Secondary | ICD-10-CM | POA: Diagnosis present

## 2023-10-10 DIAGNOSIS — N1831 Chronic kidney disease, stage 3a: Secondary | ICD-10-CM | POA: Diagnosis present

## 2023-10-10 DIAGNOSIS — Z7189 Other specified counseling: Secondary | ICD-10-CM | POA: Diagnosis not present

## 2023-10-10 DIAGNOSIS — E1322 Other specified diabetes mellitus with diabetic chronic kidney disease: Secondary | ICD-10-CM

## 2023-10-10 DIAGNOSIS — D649 Anemia, unspecified: Secondary | ICD-10-CM | POA: Diagnosis present

## 2023-10-10 DIAGNOSIS — D631 Anemia in chronic kidney disease: Secondary | ICD-10-CM | POA: Diagnosis present

## 2023-10-10 DIAGNOSIS — E034 Atrophy of thyroid (acquired): Secondary | ICD-10-CM

## 2023-10-10 DIAGNOSIS — I10 Essential (primary) hypertension: Secondary | ICD-10-CM | POA: Diagnosis present

## 2023-10-10 DIAGNOSIS — L89311 Pressure ulcer of right buttock, stage 1: Secondary | ICD-10-CM | POA: Diagnosis present

## 2023-10-10 DIAGNOSIS — Z1152 Encounter for screening for COVID-19: Secondary | ICD-10-CM | POA: Diagnosis not present

## 2023-10-10 DIAGNOSIS — F329 Major depressive disorder, single episode, unspecified: Secondary | ICD-10-CM | POA: Diagnosis present

## 2023-10-10 DIAGNOSIS — Z79890 Hormone replacement therapy (postmenopausal): Secondary | ICD-10-CM

## 2023-10-10 DIAGNOSIS — F419 Anxiety disorder, unspecified: Secondary | ICD-10-CM | POA: Diagnosis not present

## 2023-10-10 DIAGNOSIS — E782 Mixed hyperlipidemia: Secondary | ICD-10-CM

## 2023-10-10 DIAGNOSIS — R1310 Dysphagia, unspecified: Secondary | ICD-10-CM | POA: Diagnosis present

## 2023-10-10 DIAGNOSIS — E785 Hyperlipidemia, unspecified: Secondary | ICD-10-CM | POA: Diagnosis present

## 2023-10-10 DIAGNOSIS — D751 Secondary polycythemia: Secondary | ICD-10-CM | POA: Diagnosis present

## 2023-10-10 DIAGNOSIS — J690 Pneumonitis due to inhalation of food and vomit: Secondary | ICD-10-CM | POA: Diagnosis present

## 2023-10-10 DIAGNOSIS — Z87891 Personal history of nicotine dependence: Secondary | ICD-10-CM

## 2023-10-10 DIAGNOSIS — F39 Unspecified mood [affective] disorder: Secondary | ICD-10-CM | POA: Diagnosis present

## 2023-10-10 DIAGNOSIS — F02C18 Dementia in other diseases classified elsewhere, severe, with other behavioral disturbance: Secondary | ICD-10-CM | POA: Diagnosis present

## 2023-10-10 DIAGNOSIS — Z79899 Other long term (current) drug therapy: Secondary | ICD-10-CM

## 2023-10-10 DIAGNOSIS — R627 Adult failure to thrive: Secondary | ICD-10-CM | POA: Diagnosis present

## 2023-10-10 DIAGNOSIS — Z66 Do not resuscitate: Secondary | ICD-10-CM | POA: Diagnosis present

## 2023-10-10 DIAGNOSIS — N189 Chronic kidney disease, unspecified: Secondary | ICD-10-CM | POA: Diagnosis present

## 2023-10-10 DIAGNOSIS — E039 Hypothyroidism, unspecified: Secondary | ICD-10-CM | POA: Diagnosis present

## 2023-10-10 DIAGNOSIS — Z7982 Long term (current) use of aspirin: Secondary | ICD-10-CM

## 2023-10-10 DIAGNOSIS — I129 Hypertensive chronic kidney disease with stage 1 through stage 4 chronic kidney disease, or unspecified chronic kidney disease: Secondary | ICD-10-CM | POA: Diagnosis present

## 2023-10-10 DIAGNOSIS — F03918 Unspecified dementia, unspecified severity, with other behavioral disturbance: Secondary | ICD-10-CM | POA: Diagnosis present

## 2023-10-10 DIAGNOSIS — Z515 Encounter for palliative care: Secondary | ICD-10-CM

## 2023-10-10 DIAGNOSIS — F02C3 Dementia in other diseases classified elsewhere, severe, with mood disturbance: Secondary | ICD-10-CM | POA: Diagnosis present

## 2023-10-10 DIAGNOSIS — N183 Chronic kidney disease, stage 3 unspecified: Secondary | ICD-10-CM | POA: Diagnosis present

## 2023-10-10 LAB — CBC WITH DIFFERENTIAL/PLATELET
Abs Immature Granulocytes: 0 10*3/uL (ref 0.00–0.07)
Basophils Absolute: 0 10*3/uL (ref 0.0–0.1)
Basophils Relative: 0 %
Eosinophils Absolute: 0 10*3/uL (ref 0.0–0.5)
Eosinophils Relative: 0 %
HCT: 62.6 % — ABNORMAL HIGH (ref 36.0–46.0)
Hemoglobin: 20.3 g/dL — ABNORMAL HIGH (ref 12.0–15.0)
Lymphocytes Relative: 20 %
Lymphs Abs: 1.1 10*3/uL (ref 0.7–4.0)
MCH: 30.3 pg (ref 26.0–34.0)
MCHC: 32.4 g/dL (ref 30.0–36.0)
MCV: 93.4 fL (ref 80.0–100.0)
Monocytes Absolute: 0.6 10*3/uL (ref 0.1–1.0)
Monocytes Relative: 11 %
Neutro Abs: 3.9 10*3/uL (ref 1.7–7.7)
Neutrophils Relative %: 69 %
Platelets: 188 10*3/uL (ref 150–400)
RBC: 6.7 MIL/uL — ABNORMAL HIGH (ref 3.87–5.11)
RDW: 16.3 % — ABNORMAL HIGH (ref 11.5–15.5)
WBC: 5.7 10*3/uL (ref 4.0–10.5)
nRBC: 0 % (ref 0.0–0.2)
nRBC: 0 /100{WBCs}

## 2023-10-10 LAB — COMPREHENSIVE METABOLIC PANEL WITH GFR
ALT: 43 U/L (ref 0–44)
AST: 63 U/L — ABNORMAL HIGH (ref 15–41)
Albumin: 2.4 g/dL — ABNORMAL LOW (ref 3.5–5.0)
Alkaline Phosphatase: 56 U/L (ref 38–126)
Anion gap: 12 (ref 5–15)
BUN: 27 mg/dL — ABNORMAL HIGH (ref 8–23)
CO2: 26 mmol/L (ref 22–32)
Calcium: 9.3 mg/dL (ref 8.9–10.3)
Chloride: 112 mmol/L — ABNORMAL HIGH (ref 98–111)
Creatinine, Ser: 1.17 mg/dL — ABNORMAL HIGH (ref 0.44–1.00)
GFR, Estimated: 45 mL/min — ABNORMAL LOW (ref >60)
Glucose, Bld: 132 mg/dL — ABNORMAL HIGH (ref 70–99)
Potassium: 4.2 mmol/L (ref 3.5–5.1)
Sodium: 150 mmol/L — ABNORMAL HIGH (ref 135–145)
Total Bilirubin: 0.5 mg/dL (ref 0.0–1.2)
Total Protein: 7.3 g/dL (ref 6.5–8.1)

## 2023-10-10 LAB — RESP PANEL BY RT-PCR (RSV, FLU A&B, COVID)  RVPGX2
Influenza A by PCR: NEGATIVE
Influenza B by PCR: NEGATIVE
Resp Syncytial Virus by PCR: NEGATIVE
SARS Coronavirus 2 by RT PCR: NEGATIVE

## 2023-10-10 LAB — PROTIME-INR
INR: 1.3 — ABNORMAL HIGH (ref 0.8–1.2)
Prothrombin Time: 16.8 s — ABNORMAL HIGH (ref 11.4–15.2)

## 2023-10-10 LAB — RESPIRATORY PANEL BY PCR

## 2023-10-10 LAB — LIPASE, BLOOD: Lipase: 21 U/L (ref 11–51)

## 2023-10-10 LAB — CBG MONITORING, ED
Glucose-Capillary: 138 mg/dL — ABNORMAL HIGH (ref 70–99)
Glucose-Capillary: 109 mg/dL — ABNORMAL HIGH (ref 70–99)

## 2023-10-10 LAB — I-STAT CG4 LACTIC ACID, ED
Lactic Acid, Venous: 3.4 mmol/L (ref 0.5–1.9)
Lactic Acid, Venous: 7.7 mmol/L (ref 0.5–1.9)

## 2023-10-10 LAB — LACTIC ACID, PLASMA: Lactic Acid, Venous: 5.3 mmol/L (ref 0.5–1.9)

## 2023-10-10 LAB — PROCALCITONIN: Procalcitonin: 0.95 ng/mL

## 2023-10-10 MED ORDER — ACETAMINOPHEN 650 MG RE SUPP: 650.0000 mg | Freq: Four times a day (QID) | RECTAL | Status: DC | PRN

## 2023-10-10 MED ORDER — VANCOMYCIN HCL 750 MG/150ML IV SOLN
750.0000 mg | INTRAVENOUS | Status: DC
  Administered 2023-10-11: 750 mg via INTRAVENOUS
  Filled 2023-10-10: qty 150

## 2023-10-10 MED ORDER — POLYETHYLENE GLYCOL 3350 17 G PO PACK: 17.0000 g | PACK | Freq: Every day | ORAL | Status: DC | PRN

## 2023-10-10 MED ORDER — VALPROATE SODIUM 100 MG/ML IV SOLN
250.0000 mg | Freq: Two times a day (BID) | INTRAVENOUS | Status: DC
  Administered 2023-10-10 – 2023-10-12 (×5): 250 mg via INTRAVENOUS
  Filled 2023-10-10: qty 2.5
  Filled 2023-10-10: qty 250
  Filled 2023-10-10 (×2): qty 2.5
  Filled 2023-10-10: qty 250
  Filled 2023-10-10 (×3): qty 2.5
  Filled 2023-10-10: qty 250
  Filled 2023-10-10: qty 2.5

## 2023-10-10 MED ORDER — SODIUM CHLORIDE 0.9% FLUSH
3.0000 mL | Freq: Two times a day (BID) | INTRAVENOUS | Status: DC
  Administered 2023-10-10 – 2023-10-12 (×5): 3 mL via INTRAVENOUS

## 2023-10-10 MED ORDER — ENOXAPARIN SODIUM 40 MG/0.4ML IJ SOSY
40.0000 mg | PREFILLED_SYRINGE | INTRAMUSCULAR | Status: DC
Start: 2023-10-10 — End: 2023-10-13
  Administered 2023-10-10 – 2023-10-12 (×3): 40 mg via SUBCUTANEOUS
  Filled 2023-10-10 (×3): qty 0.4

## 2023-10-10 MED ORDER — MEMANTINE HCL 10 MG PO TABS
10.0000 mg | ORAL_TABLET | Freq: Two times a day (BID) | ORAL | Status: DC
  Administered 2023-10-11 – 2023-10-12 (×3): 10 mg via ORAL
  Filled 2023-10-10 (×3): qty 1

## 2023-10-10 MED ORDER — LEVOTHYROXINE SODIUM 25 MCG PO TABS
137.0000 ug | ORAL_TABLET | Freq: Every day | ORAL | Status: DC
  Administered 2023-10-11 – 2023-10-13 (×3): 137 ug via ORAL
  Filled 2023-10-10 (×3): qty 1

## 2023-10-10 MED ORDER — ENSURE ENLIVE PO LIQD
237.0000 mL | Freq: Two times a day (BID) | ORAL | Status: DC
  Administered 2023-10-12: 237 mL via ORAL
  Filled 2023-10-10: qty 237

## 2023-10-10 MED ORDER — LACTATED RINGERS IV BOLUS
500.0000 mL | Freq: Once | INTRAVENOUS | Status: AC
  Administered 2023-10-10: 500 mL via INTRAVENOUS

## 2023-10-10 MED ORDER — ACETAMINOPHEN 650 MG RE SUPP
650.0000 mg | Freq: Once | RECTAL | Status: AC
  Administered 2023-10-10: 650 mg via RECTAL
  Filled 2023-10-10: qty 1

## 2023-10-10 MED ORDER — METRONIDAZOLE 500 MG/100ML IV SOLN
500.0000 mg | Freq: Once | INTRAVENOUS | Status: AC
  Administered 2023-10-10: 500 mg via INTRAVENOUS
  Filled 2023-10-10: qty 100

## 2023-10-10 MED ORDER — ALBUTEROL SULFATE (2.5 MG/3ML) 0.083% IN NEBU: 2.5000 mg | INHALATION_SOLUTION | Freq: Four times a day (QID) | RESPIRATORY_TRACT | Status: DC | PRN

## 2023-10-10 MED ORDER — LACTATED RINGERS IV SOLN: INTRAVENOUS | Status: DC

## 2023-10-10 MED ORDER — VANCOMYCIN HCL IN DEXTROSE 1-5 GM/200ML-% IV SOLN: 1000.0000 mg | Freq: Once | INTRAVENOUS | Status: DC

## 2023-10-10 MED ORDER — METRONIDAZOLE 500 MG/100ML IV SOLN
500.0000 mg | Freq: Two times a day (BID) | INTRAVENOUS | Status: DC
  Administered 2023-10-10 – 2023-10-12 (×4): 500 mg via INTRAVENOUS
  Filled 2023-10-10 (×4): qty 100

## 2023-10-10 MED ORDER — INSULIN ASPART 100 UNIT/ML IJ SOLN
0.0000 [IU] | INTRAMUSCULAR | Status: DC
  Administered 2023-10-12 – 2023-10-13 (×3): 1 [IU] via SUBCUTANEOUS

## 2023-10-10 MED ORDER — ATORVASTATIN CALCIUM 40 MG PO TABS
40.0000 mg | ORAL_TABLET | Freq: Every day | ORAL | Status: DC
  Administered 2023-10-12: 40 mg via ORAL
  Filled 2023-10-10: qty 1

## 2023-10-10 MED ORDER — SODIUM CHLORIDE 0.9 % IV SOLN
2.0000 g | Freq: Two times a day (BID) | INTRAVENOUS | Status: DC
  Administered 2023-10-11 – 2023-10-12 (×3): 2 g via INTRAVENOUS
  Filled 2023-10-10 (×3): qty 12.5

## 2023-10-10 MED ORDER — ACETAMINOPHEN 325 MG PO TABS: 650.0000 mg | ORAL_TABLET | Freq: Four times a day (QID) | ORAL | Status: DC | PRN

## 2023-10-10 MED ORDER — MIRTAZAPINE 15 MG PO TABS
7.5000 mg | ORAL_TABLET | Freq: Every day | ORAL | Status: DC
  Administered 2023-10-12: 7.5 mg via ORAL
  Filled 2023-10-10: qty 1

## 2023-10-10 MED ORDER — VANCOMYCIN HCL 1250 MG/250ML IV SOLN
1250.0000 mg | Freq: Once | INTRAVENOUS | Status: DC
  Filled 2023-10-10: qty 250

## 2023-10-10 MED ORDER — SODIUM CHLORIDE 0.9 % IV SOLN
2.0000 g | Freq: Once | INTRAVENOUS | Status: AC
  Administered 2023-10-10: 2 g via INTRAVENOUS
  Filled 2023-10-10: qty 12.5

## 2023-10-10 MED ORDER — LACTATED RINGERS IV BOLUS
1000.0000 mL | Freq: Once | INTRAVENOUS | Status: AC
  Administered 2023-10-10: 1000 mL via INTRAVENOUS

## 2023-10-10 MED ORDER — LACTATED RINGERS IV BOLUS (SEPSIS)
1000.0000 mL | Freq: Once | INTRAVENOUS | Status: AC
  Administered 2023-10-10: 1000 mL via INTRAVENOUS

## 2023-10-10 MED ORDER — VANCOMYCIN HCL 1.25 G IV SOLR
1250.0000 mg | Freq: Once | INTRAVENOUS | Status: AC
  Administered 2023-10-10: 1250 mg via INTRAVENOUS
  Filled 2023-10-10: qty 25

## 2023-10-10 MED ORDER — DIVALPROEX SODIUM 125 MG PO CSDR: 250.0000 mg | DELAYED_RELEASE_CAPSULE | Freq: Two times a day (BID) | ORAL | Status: DC

## 2023-10-10 NOTE — H&P (Addendum)
 History and Physical   Kelly [name] JYN:[id number] DOB: June 07, 1935 DOA: 10/10/2023  PCP: Pcp, No   Patient coming from: Nursing facility   Chief Complaint: Tachycardia, fever  HPI: Kelly [name] is a 89 y.o. female with medical history significant of dementia, bedbound status, hypertension, hyperlipidemia, hypothyroidism, CKD 3A, anemia, diabetes, anxiety, depression, mood disorder with psychosis presenting with tachycardia and fever.  Routine with assistance of chart review and family.  EMS was initially called out to evaluate patient for tachycardia.  She is found to be tachycardic, tachypneic and febrile.  Family reports that patient has had a cough for several days and has had increased cough with feeding.  She is on pured diet.  She does have history of UTIs as well.  Patient unable to significantly participate in review of systems due to dementia  ED Course: Vital signs in the ED notable for fever to 103, heart rate in the 90s-100s, blood pressure in the 140s-150s systolic, respiratory rate in the 20s.  Lab workup significant for CMP with sodium 150, chloride 112, BUN 27, creatinine 1.17 from baseline 0.9, glucose 132, albumin 2.4, AST 63.  CBC with hemoglobin 20.3.  PT 16.8, INR 1.3.  Lactic acid 3.4 with repeat pending.  Lipase normal.  Respiratory panel for flu COVID and RSV negative.  Urinalysis pending.  Blood cultures pending.  Chest x-ray with ill-defined lingular and left lower lobe infiltrate without consolidation concerning for pneumonia.  Patient received Tylenol , vancomycin , cefepime , Flagyl  in the ED.  Also received 2 L IV fluids and started on a rate IV fluids.  Review of Systems: Patient unable to significantly participate in review of systems due to dementia  Past Medical History:  Diagnosis Date   Acute respiratory failure with hypoxia (HCC) 05/04/2022   Chronic kidney disease    Stage III   Dementia with behavioral disturbance (HCC) 03/19/2015   Notated on FL2 Form  from Dr. Aldean Amass [phone]   Depression    previously saw Dr Levie Ream   Diabetes mellitus without complication (HCC)    11/18/15 A1c 6%   Dyslipidemia    11/18/15 Tg 74, HDL 60, LDL 90 on statin   Fracture of radial head, closed 05/12/2021   05/09/2021 elbow films revealed possible subtle transversely oriented fracture left radial head.  Effusion and soft tissue edema noted over the olecranon.   Hypertension    Hypothyroidism    11/18/15 TSH 53.6, 02/07/16 TSH 9.42 @ [facility]   MDD (major depressive disorder)    With behavioral disturbance   Pneumonia 05/04/2022   Pneumonia due to COVID-19 virus 05/04/2022   Scabies    02/21/16 [facility],[place]; Rx: Permethrin cream   Urinary tract infection due to Proteus 05/04/2019   Uniformly sensitive except to nitrofurantoin; Cipro prescribed   UTI (urinary tract infection) 05/05/2019   Vitamin D  deficiency    11/18/15 vitamin D  25-hydroxy 39    Past Surgical History:  Procedure Laterality Date   no record     04/10/15 [facility] note: see old chart    Social History  reports that she has quit smoking. She has never used smokeless tobacco. She reports that she does not drink alcohol and does not use drugs.  No Known Allergies  Family History  Family history unknown: Yes  Reviewed on admission  Prior to Admission medications   Medication Sig Start Date End Date Taking? Authorizing Provider  albuterol  (VENTOLIN  HFA) 108 (90 Base) MCG/ACT inhaler Inhale 2 puffs into the lungs every 6 (  six) hours as needed for wheezing or shortness of breath. 05/09/22  Yes Rai, Ripudeep K, MD  ARTIFICIAL TEAR SOLUTION OP Place 1 drop into both eyes in the morning and at bedtime. *Both eyes*   Yes [provider]  aspirin  EC 81 MG tablet Take 81 mg by mouth daily.    Yes [provider]  atorvastatin  (LIPITOR) 40 MG tablet Take 40 mg by mouth at bedtime.    Yes [provider]  Calcium  Carb-Cholecalciferol  (CALCIUM -VITAMIN D3) 600-400 MG-UNIT TABS Take 1 tablet by mouth 2 (two) times daily.   Yes [provider]  cholecalciferol (VITAMIN D3) 25 MCG (1000 UNIT) tablet Take 2,000 Units by mouth daily. Take 2 tablet (2000 unit total) by mouth one time daily for vitamin deficiency   Yes [provider]  divalproex  (DEPAKOTE  SPRINKLE) 125 MG capsule Take 250 mg by mouth 2 (two) times daily.   Yes [provider]  Ensure (ENSURE) Take 237 mLs by mouth 2 (two) times daily between meals.   Yes [provider]  furosemide (LASIX) 20 MG tablet Take 20 mg by mouth every Monday, Wednesday, and Friday.    Yes [provider]  levothyroxine  (SYNTHROID ) 137 MCG tablet Take 137 mcg by mouth daily before breakfast.   Yes [provider]  memantine  (NAMENDA ) 10 MG tablet Take 10 mg by mouth 2 (two) times daily.    Yes [provider]  mirtazapine  (REMERON ) 7.5 MG tablet Take 7.5 mg by mouth at bedtime.   Yes [provider]  Multiple Vitamin (MULTI-VITAMIN DAILY PO) Take 1 tablet by mouth daily.   Yes [provider]  NON FORMULARY Apply 1 application  topically in the morning, at noon, and at bedtime. Ativan  0.5mg /ml topical Gel   Yes [provider]  Nutritional Supplements (ENSURE ENLIVE PO) Take 8 fluid ounces by mouth daily. ENSURE ENLIVE LIQUID GIVE 8 OUNCE BY MOUTH ONCE DAILY FOR SUPPLEMENT   Yes [provider]  Nutritional Supplements (NUTRITIONAL SUPPLEMENT PO) Take 1 each by mouth daily. Magic Cup to help stabilize weight   Yes [provider]  oxyCODONE HCl 10 MG TABA Take 1 tablet by mouth daily as needed (for pain).   Yes [provider]  sennosides-docusate sodium (SENOKOT-S) 8.6-50 MG tablet Take 2 tablets by mouth at bedtime.   Yes [provider]  Skin Protectants, Misc. (MINERIN) CREA Apply 1 Application topically in the morning and at bedtime. Apply topically to ankles twice a  day for dry skin.   Yes [provider]  acetaminophen  (TYLENOL ) 325 MG tablet Take 650 mg by mouth every 6 (six) hours as needed for mild pain (pain score 1-3).    [provider]  carbamide peroxide (DEBROX) 6.5 % OTIC solution Place 2-3 drops into the right ear See admin instructions. 2-3 drops to right ear 3 times a week until next audiology appointment Patient not taking: Reported on 10/10/2023    [provider]  QUEtiapine  (SEROQUEL ) 50 MG tablet Take 50 mg by mouth 2 (two) times daily. Patient not taking: Reported on 10/10/2023    [provider]  Sodium Phosphates (RA SALINE ENEMA RE) Place 1 enema rectally daily as needed (Constipation). If not relieved by Biscodyl suppository, give disposable Saline Enema rectally X 1 dose/24 hrs as needed (Do not use constipation standing orders for residents with renal failure/CFR less than 30. Contact MD for orders)(Physician Or    [provider]  valbenazine  (INGREZZA ) 80  MG capsule Take 80 mg by mouth daily. Patient not taking: Reported on 10/10/2023    [provider]    Physical Exam: Vitals:   10/10/23 1630 10/10/23 1730 10/10/23 1745 10/10/23 1846  BP: (!) 158/74 (!) 150/87 (!) 149/64   Pulse: 100 94 94   Resp: (!) 30 (!) 27 (!) 22   Temp:    98 F (36.7 C)  TempSrc:    Oral  SpO2: 99% 100% 100%     Physical Exam Constitutional:      General: She is not in acute distress.    Appearance: She is ill-appearing.  HENT:     Head: Normocephalic and atraumatic.     Mouth/Throat:     Mouth: Mucous membranes are moist.     Pharynx: Oropharynx is clear.  Eyes:     Extraocular Movements: Extraocular movements intact.     Pupils: Pupils are equal, round, and reactive to light.  Cardiovascular:     Rate and Rhythm: Regular rhythm. Tachycardia present.     Pulses: Normal pulses.     Heart sounds: Normal heart sounds.  Pulmonary:     Effort: Pulmonary effort is normal. No respiratory  distress.     Breath sounds: Normal breath sounds.  Abdominal:     General: Bowel sounds are normal. There is no distension.     Palpations: Abdomen is soft.     Tenderness: There is no abdominal tenderness.  Musculoskeletal:        General: No swelling or deformity.  Skin:    General: Skin is warm and dry.  Neurological:     General: No focal deficit present.     Mental Status: Mental status is at baseline.    Labs on Admission: I have personally reviewed following labs and imaging studies  CBC: Recent Labs  Lab 10/10/23 1514  WBC 5.7  NEUTROABS 3.9  HGB 20.3*  HCT 62.6*  MCV 93.4  PLT 188    Basic Metabolic Panel: Recent Labs  Lab 10/10/23 1514  NA 150*  K 4.2  CL 112*  CO2 26  GLUCOSE 132*  BUN 27*  CREATININE 1.17*  CALCIUM  9.3    GFR: CrCl cannot be calculated (Unknown ideal weight.).  Liver Function Tests: Recent Labs  Lab 10/10/23 1514  AST 63*  ALT 43  ALKPHOS 56  BILITOT 0.5  PROT 7.3  ALBUMIN 2.4*    Urine analysis:    Component Value Date/Time   COLORURINE AMBER (A) 05/29/2023 0353   APPEARANCEUR TURBID (A) 05/29/2023 0353   LABSPEC 1.023 05/29/2023 0353   PHURINE 5.0 05/29/2023 0353   GLUCOSEU NEGATIVE 05/29/2023 0353   HGBUR SMALL (A) 05/29/2023 0353   BILIRUBINUR NEGATIVE 05/29/2023 0353   KETONESUR NEGATIVE 05/29/2023 0353   PROTEINUR 100 (A) 05/29/2023 0353   NITRITE POSITIVE (A) 05/29/2023 0353   LEUKOCYTESUR MODERATE (A) 05/29/2023 0353    Radiological Exams on Admission: DG Chest Port 1 View Result Date: 10/10/2023 CLINICAL DATA:  Sepsis EXAM: PORTABLE CHEST 1 VIEW COMPARISON:  May 29, 2023 FINDINGS: Ill-defined lingular and left lower lobe infiltrates without consolidations Right lung clear Heart and mediastinum normal No pleural effusions IMPRESSION: Ill-defined lingular and left lower lobe infiltrates without consolidations. Electronically Signed   By: Fredrich Jefferson M.D.   On: 10/10/2023 15:58   EKG:  Independently reviewed.  Sinus rhythm at 95 bpm.  Nonspecific T wave changes.  Some baseline artifact.  Low voltage multiple leads.  Assessment/Plan Principal Problem:   Sepsis (  HCC) Active Problems:   Dementia with behavioral disturbance (HCC)   Essential hypertension   Mood disorder with psychosis (HCC)   Secondary DM with CKD stage 3 and hypertension (HCC)   Stage 3a chronic kidney disease (CKD) (HCC)   Bedbound   Hypothyroidism   Hyperlipidemia   MDD (major depressive disorder)   Anxiety   Normochromic normocytic anemia   Sepsis > Patient presenting with fever, tachycardia, tachypnea.  No leukocytosis.  Initial lactic acid 3.4.  Most likely etiology is pneumonia with evidence of pneumonia at the left lower lobe and lingula.  Unclear if bacterial or viral given lack of leukocytosis but patient may be slow to mount immune response with her advanced age. > Received IV fluids, broad-spectrum antibiotics, Tylenol  in the ED. - Monitor on progressive unit overnight - Continue with broad-spectrum antibiotics: Vancomycin , cefepime , Flagyl  - Trend fever curve, WBC, LA - Follow-up urinalysis and blood cultures - Check full Story viral panel - Check procalcitonin - Supportive care ADDENDUM > Repeat Lactic Acid increased to 7.8. Was drawn as second bolus was finishing. BP stable. - Continue to trend LA and continue IVF.  Dementia Anxiety Depression Mood disorder with psychosis - Continue home Namenda , mirtazapine  when able - Depakote  IV for now  Dysphagia > Dys 1 diet and thickened liquids at facility - NPO given concern for aspiration - SLP eval  Hypertension - Holding Lasix  Hyperlipidemia - Continue home atorvastatin   Hypothyroidism - Continue home Synthroid   CKD 3A Hypernatremia > Creatinine stable.  Sodium 150, will monitor response to fluids. - Trend renal function and electrolytes  Anemia > Erythrocytosis with hemoglobin of 20.  Possibly dehydration.  Will  monitor response to IV fluids. - Trend CBC  Diabetes - SSI   DVT prophylaxis: Lovenox  Code Status:   DNR/DNI, Call daughter if declining. Family Communication:  Updated at bedside  Disposition Plan:   Patient is from:  SNF  Anticipated DC to:  Same as above  Anticipated DC date:  2 to 7 days  Anticipated DC barriers: None  Consults called:  None Admission status:  Inpatient, progressive  Severity of Illness: The appropriate patient status for this patient is INPATIENT. Inpatient status is judged to be reasonable and necessary in order to provide the required intensity of service to ensure the patient's safety. The patient's presenting symptoms, physical exam findings, and initial radiographic and laboratory data in the context of their chronic comorbidities is felt to place them at high risk for further clinical deterioration. Furthermore, it is not anticipated that the patient will be medically stable for discharge from the hospital within 2 midnights of admission.   * I certify that at the point of admission it is my clinical judgment that the patient will require inpatient hospital care spanning beyond 2 midnights from the point of admission due to high intensity of service, high risk for further deterioration and high frequency of surveillance required.Johnetta Nab MD [facility]  How to contact the [facility] Attending or Consulting provider 7A - 7P or covering provider during after hours 7P -7A, for this patient?   Check the care team in [facility] and look for a) attending/consulting [facility] provider listed and b) the [facility] team listed Log into [email or web address] and use [facility]'s universal password to access. If you do not have the password, please contact the hospital operator. Locate the [facility] provider you are looking for under [facility] and page to a number that you can be directly reached.  If you still have difficulty reaching the provider, please page the [facility] (Director  on Call) for the Hospitalists listed on amion for assistance.  10/10/2023, 7:01 PM

## 2023-10-10 NOTE — ED Notes (Signed)
 Family at bedside.  Pt alert, on monitor.  Shows no signs of distress

## 2023-10-10 NOTE — Sepsis Progress Note (Signed)
 eLink is following this Code Sepsis.

## 2023-10-10 NOTE — ED Triage Notes (Signed)
 [facility] initially called out for abnormal VS, ems arrived and O2 was low, pt was tachycardiac. Pt non verbal

## 2023-10-10 NOTE — ED Provider Notes (Signed)
 [place] [facility]ERGENC[facility] DE[facility]MENT AT [facility] Provider Note   CSN: [id number] Arrival date & time: 10/10/23  1437    History  Chief Complaint  Patient presents with   Code Sepsis    Kelly [name] is a 89 y.o. female here for evaluation of abnormal vital signs.  Patient with severe dementia.  Minimally verbal.  She is bedbound at baseline.  She is only on pures. EMS initially called out for tachycardia.  Patient found to be febrile, tachycardic, tachypneic.  Family states patient has had cough over the last few days.  Family states that when they have been trying to feed her the last few days she has increasing coughing. She has a history of frequent UTIs.  She has a pressure wound to her right hip, low back  At baseline per family  Level 5 caveat-dementia  HPI     Home Medications Prior to Admission medications   Medication Sig Start Date End Date Taking? Authorizing Provider  acetaminophen  (TYLENOL ) 325 MG tablet Take 650 mg by mouth every 6 (six) hours as needed.    [provider]  albuterol  (VENTOLIN  HFA) 108 (90 Base) MCG/ACT inhaler Inhale 2 puffs into the lungs every 6 (six) hours as needed for wheezing or shortness of breath. 05/09/22   Rai, Hurman Maiden, MD  [employer] Apply 2 drops to eye in the morning and at bedtime. *Both eyes*    [provider]  aspirin  EC 81 MG tablet Take 81 mg by mouth daily.     [provider]  atorvastatin  (LIPITOR) 40 MG tablet Take 40 mg by mouth at bedtime.     [provider]  bisacodyl (DULCOLAX) 10 MG suppository Place 10 mg rectally daily as needed for mild constipation or moderate constipation. If not relieved by MOM, give 10 mg Bisacodyl suppositiory rectally X 1 dose in 24 hours as needed (Do not use constipation standing orders for residents with renal failure/CFR less than 30. Contact MD for orders) (Physician Order)    [provider]  Calcium  Carb-Cholecalciferol  (CALCIUM -VITAMIN D3) 600-400 MG-UNIT TABS Take 1 tablet by mouth 2 (two) times daily.    [provider]  carbamide peroxide (DEBROX) 6.5 % OTIC solution Place 2-3 drops into the right ear See admin instructions. 2-3 drops to right ear 3 times a week until next audiology appointment    [provider]  cefUROXime  (CEFTIN ) 250 MG tablet Take 1 tablet (250 mg total) by mouth 2 (two) times daily with a meal. 05/29/23   Pollina, Marine Sia, MD  cholecalciferol (VITAMIN D3) 25 MCG (1000 UNIT) tablet Take 2,000 Units by mouth daily. Take 2 tablet (2000 unit total) by mouth one time daily for vitamin deficiency    [provider]  divalproex  (DEPAKOTE  SPRINKLE) 125 MG capsule Take 125 mg by mouth in the morning, at noon, in the evening, and at bedtime.    [provider]  feeding supplement (BOOST HIGH PROTEIN) LIQD Take 1 Container by mouth daily. IF ENSURE IS UNAVAILABLE    [provider]  furosemide (LASIX) 20 MG tablet Take 20 mg by mouth every Monday, Wednesday, and Friday.     [provider]  levothyroxine  (SYNTHROID ) 137 MCG tablet Take 137 mcg by mouth daily before breakfast.    [provider]  magnesium hydroxide (MILK OF MAGNESIA) 400 MG/5ML suspension Take 30 mLs by mouth daily as needed for mild constipation or moderate constipation. If no BM in  3 days, give 30 cc Milk of Magnesium p.o. x 1 dose in 24 hours as needed (Do not use standing constipation orders for residents with renal failure CFR less than 30. Contact MD for orders) (Physician Order)    [provider]  memantine  (NAMENDA ) 10 MG tablet Take 10 mg by mouth 2 (two) times daily.     [provider]  Multiple Vitamin (MULTI-VITAMIN DAILY PO) Take 1 tablet by mouth daily.    [provider]  NON FORMULARY Apply 1 application  topically in the morning, at noon, and at bedtime. Ativan  0.5mg /ml topical Gel    [provider]  Nutritional  Supplements (ENSURE ENLIVE PO) Take 8 fluid ounces by mouth daily. ENSURE ENLIVE LIQUID GIVE 8 OUNCE BY MOUTH ONCE DAILY FOR SUPPLEMENT    [provider]  Nutritional Supplements (NUTRITIONAL SUPPLEMENT PO) Take 1 each by mouth daily. Magic Cup to help stabilize weight    [provider]  QUEtiapine  (SEROQUEL ) 50 MG tablet Take 50 mg by mouth 2 (two) times daily.    [provider]  sennosides-docusate sodium (SENOKOT-S) 8.6-50 MG tablet Take 2 tablets by mouth daily.     [provider]  Skin Protectants, Misc. (MINERIN) CREA Apply 1 Application topically in the morning and at bedtime. Apply topically to ankles twice a day for dry skin.    [provider]  Sodium Phosphates (RA SALINE ENEMA RE) Place 1 enema rectally daily as needed (Constipation). If not relieved by Biscodyl suppository, give disposable Saline Enema rectally X 1 dose/24 hrs as needed (Do not use constipation standing orders for residents with renal failure/CFR less than 30. Contact MD for orders)(Physician Or    [provider]  valbenazine  (INGREZZA ) 80 MG capsule Take 80 mg by mouth daily.    [provider]      Allergies    Patient has no known allergies.    Review of Systems   Review of Systems  Unable to perform ROS: Dementia  Respiratory:  Positive for cough.   Neurological:  Positive for weakness (chronic).  All other systems reviewed and are negative.   Physical Exam Updated Vital Signs BP (!) 158/74   Pulse 100   Temp (!) 103 F (39.4 C) (Rectal)   Resp (!) 30   SpO2 99%  Physical Exam Vitals and nursing note reviewed.  Constitutional:      Appearance: She is well-developed. She is ill-appearing and toxic-appearing.     Comments: Thin, cachectic, ill-appearing  HENT:     Head: Atraumatic.     Nose: Nose normal.     Mouth/Throat:     Mouth: Mucous membranes are dry.     Comments: Extremely dry mucous membranes, tongue midline with  fissures Eyes:     Pupils: Pupils are equal, round, and reactive to light.  Cardiovascular:     Rate and Rhythm: Tachycardia present.     Pulses:          Radial pulses are 2+ on the right side and 2+ on the left side.       Dorsalis pedis pulses are 2+ on the right side and 2+ on the left side.  Pulmonary:     Effort: Tachypnea present. No respiratory distress.     Breath sounds: Rhonchi present.     Comments: Diffuse rhonchi throughout, left greater than right Abdominal:     General: Bowel sounds are normal. There is no distension.     Palpations: Abdomen  is soft.     Tenderness: There is no abdominal tenderness. There is no guarding or rebound.  Musculoskeletal:        General: Normal range of motion.     Cervical back: Normal range of motion.     Comments: Contracted lower extremities, pressure wound right hip.  Moves upper extremities spontaneously  Skin:    General: Skin is warm and dry.  Neurological:     General: No focal deficit present.     Mental Status: She is lethargic.     Comments: Moans (baseline), lethargic No obvious facial droop Does not ambulate Cannot follow commands  Psychiatric:        Mood and Affect: Mood normal.    ED Results / Procedures / Treatments   Labs (all labs ordered are listed, but only abnormal results are displayed) Labs Reviewed  COMPREHENSIVE METABOLIC PANEL WITH GFR - Abnormal; Notable for the following components:      Result Value   Sodium 150 (*)    Chloride 112 (*)    Glucose, Bld 132 (*)    BUN 27 (*)    Creatinine, Ser 1.17 (*)    Albumin 2.4 (*)    AST 63 (*)    GFR, Estimated 45 (*)    All other components within normal limits  CBC WITH DIFFERENTIAL/PLATELET - Abnormal; Notable for the following components:   RBC 6.70 (*)    Hemoglobin 20.3 (*)    HCT 62.6 (*)    RDW 16.3 (*)    All other components within normal limits  PROTIME-INR - Abnormal; Notable for the following components:   Prothrombin Time 16.8 (*)     INR 1.3 (*)    All other components within normal limits  CBG MONITORING, ED - Abnormal; Notable for the following components:   Glucose-Capillary 138 (*)    All other components within normal limits  I-STAT CG4 LACTIC ACID, ED - Abnormal; Notable for the following components:   Lactic Acid, Venous 3.4 (*)    All other components within normal limits  RESP PANEL BY RT-PCR (RSV, FLU A&B, COVID)  RVPGX2  CULTURE, BLOOD (ROUTINE X 2)  CULTURE, BLOOD (ROUTINE X 2)  LIPASE, BLOOD  URINALYSIS, W/ REFLEX TO CULTURE (INFECTION SUSPECTED)  PROTIME-INR  I-STAT CG4 LACTIC ACID, ED    EKG None  Radiology DG Chest Port 1 View Result Date: 10/10/2023 CLINICAL DATA:  Sepsis EXAM: PORTABLE CHEST 1 VIEW COMPARISON:  May 29, 2023 FINDINGS: Ill-defined lingular and left lower lobe infiltrates without consolidations Right lung clear Heart and mediastinum normal No pleural effusions IMPRESSION: Ill-defined lingular and left lower lobe infiltrates without consolidations. Electronically Signed   By: Fredrich Jefferson M.D.   On: 10/10/2023 15:58    Procedures .Critical Care  Performed by: Dickson Founds, PA-C Authorized by: Dickson Founds, PA-C   Critical care provider statement:    Critical care time (minutes):  35   Critical care was necessary to treat or prevent imminent or life-threatening deterioration of the following conditions:  Sepsis   Critical care was time spent personally by me on the following activities:  Development of treatment plan with patient or surrogate, discussions with consultants, evaluation of patient's response to treatment, examination of patient, ordering and review of laboratory studies, ordering and review of radiographic studies, ordering and performing treatments and interventions, pulse oximetry, re-evaluation of patient's condition and review of old charts     Medications Ordered in ED Medications  lactated ringers  infusion (  Intravenous New Bag/Given  10/10/23 1622)  ceFEPIme  (MAXIPIME ) 2 g in sodium chloride  0.9 % 100 mL IVPB (0 g Intravenous Stopped 10/10/23 1609)  metroNIDAZOLE  (FLAGYL ) IVPB 500 mg (0 mg Intravenous Stopped 10/10/23 1704)  lactated ringers  bolus 1,000 mL (0 mLs Intravenous Stopped 10/10/23 1719)  acetaminophen  (TYLENOL ) suppository 650 mg (650 mg Rectal Given 10/10/23 1541)  Vancomycin  (VANCOCIN ) 1,250 mg in sodium chloride  0.9 % 250 mL IVPB (1,250 mg Intravenous New Bag/Given 10/10/23 1609)  lactated ringers  bolus 1,000 mL (1,000 mLs Intravenous New Bag/Given 10/10/23 1659)    ED Course/ Medical Decision Making/ A&P Clinical Course as of 10/10/23 1746  Thu Oct 10, 2023  1552 Patient with DNR paperwork at bedside from facility. Discussed with patient's daughter at bedside.  She confirms patient is DNR, DNI. [BH]    Clinical Course User Index [BH] Dean Wonder A, PA-C  89 year old history of severe dementia nonmobile at baseline, minimally verbal here for evaluation of abnormal vitals.  Found to be febrile, tachycardic and tachypneic.  She only takes pures.  Sounds like with eating over the last few days she has had a lot of of coughing on trying to eat her pured foods.  Daughter does note frequent UTIs.  She is at her baseline mentation.  No recent falls or injuries according to daughter.  DNR/DNI paperwork at bedside which I confirmed with family.  They are okay with IV fluids, antibiotics.  She does have a pressure wound to her right hip.  Her lower extremities are contracted.  She has coarse rhonchi throughout left greater than right.  Wet cough on exam. She appears very dehydrated to her mucous membranes.  Code sepsis called given vital signs.  Broad-spectrum antibiotics given.  Labs and imaging personally viewed and interpreted:  CBC without leukocytosis hemoglobin 20.3 suspect dehydration related Metabolic panel sodium 150, glucose 132, creatinine 1.17 up from baseline Lipase 21 INR 1.3 Lactic 3.4, second pending  on admission Chest x-ray left lower lobe infiltrate EKG with artifact however appears sinus tachycardia  Patient sepsis criteria, febrile, tachycardic, tachypneic.  She has pneumonia on chest x-ray and clinically sounds like she has pneumonia I question aspiration given family's history that she is on pures and has difficulty swallowing at baseline has been coughing more with eating.  Nursing unable to get In-N-Out catheter due to patient contracted extremities.  PureWick was placed.  She received IV fluids and broad-spectrum antibiotics.  Will admit to medicine.  Consult with Dr. Melvin with medicine who is agreeable to evaluate for admission  The patient appears reasonably stabilized for admission considering the current resources, flow, and capabilities available in the ED at this time, and I doubt any other [facility] requiring further screening and/or treatment in the ED prior to admission.                                 Medical Decision Making Amount and/or Complexity of Data Reviewed Independent Historian:     Details: Daughter in room External Data Reviewed: labs, radiology, ECG and notes. Labs: ordered. Decision-making details documented in ED Course. Radiology: ordered and independent interpretation performed. Decision-making details documented in ED Course. ECG/medicine tests: ordered and independent interpretation performed. Decision-making details documented in ED Course.  Risk OTC drugs. Prescription drug management. Parenteral controlled substances. Decision regarding hospitalization. Diagnosis or treatment significantly limited by social determinants of health.           Final  Clinical Impression(s) / ED Diagnoses Final diagnoses:  Sepsis with acute renal failure without septic shock, due to unspecified organism, unspecified acute renal failure type (HCC)  Hypernatremia  AKI (acute kidney injury) [facility])    Rx / DC Orders ED Discharge Orders     None          Pearley Millington A, PA-C 10/10/23 1929    Kingsley, Victoria K, DO 10/10/23 2053

## 2023-10-10 NOTE — Progress Notes (Signed)
 Pharmacy Antibiotic Note  Kelly [name] is a 89 y.o. female admitted on 10/10/2023 presenting with concern for sepsis.  Pharmacy has been consulted for vancomycin  dosing.  Vancomycin  1250 mg IV x 1 given in ED  Plan: Vancomycin  750 mg IV q 24h (eAUC 474) Monitor renal function, Cx and clinical progression to narrow Vancomycin  levels as indicated      Temp (24hrs), Avg:100.5 F (38.1 C), Min:98 F (36.7 C), Max:103 F (39.4 C)  Recent Labs  Lab 10/10/23 1514 10/10/23 1537 10/10/23 1850  WBC 5.7  --   --   CREATININE 1.17*  --   --   LATICACIDVEN  --  3.4* 7.7*    CrCl cannot be calculated (Unknown ideal weight.).    No Known Allergies  Trinidad Funk, PharmD, [facility] Clinical Pharmacist ED Pharmacist Phone # [phone] 10/10/2023 7:33 PM

## 2023-10-11 DIAGNOSIS — A419 Sepsis, unspecified organism: Secondary | ICD-10-CM | POA: Diagnosis not present

## 2023-10-11 LAB — COMPREHENSIVE METABOLIC PANEL WITH GFR
ALT: 28 U/L (ref 0–44)
AST: 35 U/L (ref 15–41)
Albumin: 1.6 g/dL — ABNORMAL LOW (ref 3.5–5.0)
Alkaline Phosphatase: 37 U/L — ABNORMAL LOW (ref 38–126)
Anion gap: 8 (ref 5–15)
BUN: 20 mg/dL (ref 8–23)
CO2: 27 mmol/L (ref 22–32)
Calcium: 8.7 mg/dL — ABNORMAL LOW (ref 8.9–10.3)
Chloride: 115 mmol/L — ABNORMAL HIGH (ref 98–111)
Creatinine, Ser: 0.79 mg/dL (ref 0.44–1.00)
GFR, Estimated: >60 mL/min (ref >60)
Glucose, Bld: 115 mg/dL — ABNORMAL HIGH (ref 70–99)
Potassium: 3.8 mmol/L (ref 3.5–5.1)
Sodium: 150 mmol/L — ABNORMAL HIGH (ref 135–145)
Total Bilirubin: 0.7 mg/dL (ref 0.0–1.2)
Total Protein: 5.2 g/dL — ABNORMAL LOW (ref 6.5–8.1)

## 2023-10-11 LAB — CBC
HCT: 31.7 % — ABNORMAL LOW (ref 36.0–46.0)
Hemoglobin: 10.3 g/dL — ABNORMAL LOW (ref 12.0–15.0)
MCH: 30.5 pg (ref 26.0–34.0)
MCHC: 32.5 g/dL (ref 30.0–36.0)
MCV: 93.8 fL (ref 80.0–100.0)
Platelets: 196 10*3/uL (ref 150–400)
RBC: 3.38 MIL/uL — ABNORMAL LOW (ref 3.87–5.11)
RDW: 14.6 % (ref 11.5–15.5)
WBC: 12.4 10*3/uL — ABNORMAL HIGH (ref 4.0–10.5)
nRBC: 0 % (ref 0.0–0.2)

## 2023-10-11 LAB — BLOOD CULTURE ID PANEL (REFLEXED) - BCID2

## 2023-10-11 LAB — CBG MONITORING, ED
Glucose-Capillary: 109 mg/dL — ABNORMAL HIGH (ref 70–99)
Glucose-Capillary: 112 mg/dL — ABNORMAL HIGH (ref 70–99)
Glucose-Capillary: 116 mg/dL — ABNORMAL HIGH (ref 70–99)
Glucose-Capillary: 92 mg/dL (ref 70–99)

## 2023-10-11 LAB — HEMOGLOBIN A1C
Hgb A1c MFr Bld: 6 % — ABNORMAL HIGH (ref 4.8–5.6)
Mean Plasma Glucose: 125.5 mg/dL

## 2023-10-11 LAB — LACTIC ACID, PLASMA: Lactic Acid, Venous: 2 mmol/L (ref 0.5–1.9)

## 2023-10-11 LAB — GLUCOSE, CAPILLARY
Glucose-Capillary: 99 mg/dL (ref 70–99)
Glucose-Capillary: 98 mg/dL (ref 70–99)
Glucose-Capillary: 98 mg/dL (ref 70–99)

## 2023-10-11 LAB — PROTIME-INR
INR: 1.7 — ABNORMAL HIGH (ref 0.8–1.2)
Prothrombin Time: 20.3 s — ABNORMAL HIGH (ref 11.4–15.2)

## 2023-10-11 LAB — CORTISOL-AM, BLOOD: Cortisol - AM: 22.3 ug/dL (ref 6.7–22.6)

## 2023-10-11 LAB — PROCALCITONIN: Procalcitonin: 1.69 ng/mL

## 2023-10-11 LAB — MRSA NEXT GEN BY PCR, NASAL: MRSA by PCR Next Gen: NOT DETECTED

## 2023-10-11 MED ORDER — MEDIHONEY WOUND/BURN DRESSING EX PSTE
1.0000 | PASTE | Freq: Every day | CUTANEOUS | Status: DC
  Administered 2023-10-11 – 2023-10-12 (×2): 1 via TOPICAL
  Filled 2023-10-11: qty 44

## 2023-10-11 NOTE — Consult Note (Addendum)
 WOC Nurse Consult Note: Reason for Consult: Consult requested for right hip.  Performed remotely after review of progress notes and photo in the EMR.  Right hip with chronic Stage 4 pressure injury, present on admission, according to the bedside nurses' wound care flow sheet, 8X4X2cm, approx 60% yellow slough, 40% red, mod amt tan drainage.  Pressure Injury POA: Yes Dressing procedure/placement/frequency: Topical treatment orders provided for bedside nurses to perform as follows to assist with removal of nonviable tissue: Apply Medihoney to right hip wound Q day, then cover with gauze and foam dressing.  Change foam dressing Q 3 days or PRN soiling.  Please re-consult if further assistance is needed.  Thank-you,  Wiliam Harder MSN, RN, CWOCN, [place], CNS [phone]

## 2023-10-11 NOTE — ED Notes (Signed)
 Speech therapy advised to crush meds and give in applesauce

## 2023-10-11 NOTE — Progress Notes (Signed)
 Patient was fully awake so I attempted to help feed her. I gave her one small bite but she would not follow commands to swallow and just pocketed it. Food removed and Dr. Carlette Cheers notified. Orders given to make patient NPO. Order placed.

## 2023-10-11 NOTE — Plan of Care (Signed)

## 2023-10-11 NOTE — Progress Notes (Signed)
  Progress Note   Patient: Kelly [name] ZOX:[id number] DOB: 09-02-1934 DOA: 10/10/2023     1 DOS: the patient was seen and examined on 10/11/2023    Assessment and Plan: Sepsis due to LLL PNA  - IV cefepime  2 g q12  - IV flagyl  500 mg q12  - IV vancomycin  750 mg daily  - Albuterol  q6 hr PRN   Anxiety/depression/dementia  - Namenda  10 mg PO bid - Remeron  7.5 mg PO at bedtime  - Valproate 250 mg q12    Dysphagia  - DYS 1 diet per speech  HTN  - Monitor   HLD  - Lipitor 40 mg PO at bedtime   Hypothyroidism  - Synthroid  137 mcg PO daily   HyperNa+ - Monitor   Diabetes  - Novolog  SS q4 hr   Subjective: Pt seen and examined at the bedside. WBC up to 12.4 today. Pt on IV antibx and will likely remain in the hospital through the weekend.  Physical Exam: Vitals:   10/11/23 0430 10/11/23 0725 10/11/23 0815 10/11/23 0830  BP: (!) 117/55   (!) 116/58  Pulse: 80  78 74  Resp: (!) 28  (!) 25 (!) 24  Temp:  98 F (36.7 C)    TempSrc:  Oral    SpO2: 98%  100% 100%   Physical Exam Constitutional:      Comments: Sleeping  HENT:     Head: Normocephalic.     Mouth/Throat:     Mouth: Mucous membranes are moist.  Cardiovascular:     Rate and Rhythm: Normal rate and regular rhythm.  Pulmonary:     Effort: Pulmonary effort is normal.  Skin:    General: Skin is warm.  Neurological:     Comments: Unable to evaluate      Disposition: Status is: Inpatient Remains inpatient appropriate because: IV antibx  Planned Discharge Destination: Barriers to discharge: As above     Time spent: 35 minutes  Author: Tigerlily Christine , MD 10/11/2023 12:28 PM  For on call review [email or web address].

## 2023-10-11 NOTE — Progress Notes (Signed)
 PHARMACY - PHYSICIAN COMMUNICATION CRITICAL VALUE ALERT - BLOOD CULTURE IDENTIFICATION (BCID)  Kelly [name] is an 89 y.o. female who presented to [facility] on 10/10/2023 with a chief complaint of concern for sepsis.  Assessment:  Blood cx positive in 1/2 sets for S. Epi, likely a contaminant. RVP 4/24 negative. CXR showing L lower lobe infiltrate w/o consolidation. Last fever was 4/24 @2230 , now afebrile. Currently being treated for concern for pneumonia.  Name of physician (or Provider) Contacted: Dr. Mickle Albe  Current antibiotics:  - Vancomycin  750 mg IV q24h 4/24>> - Cefepime  2g IV q12h 4/24 >> - Flagyl  500 mg IV q12h 4/24 >>  Changes to prescribed antibiotics recommended:  Patient is on recommended antibiotics - No changes needed  Results for orders placed or performed during the hospital encounter of 10/10/23  Blood Culture ID Panel (Reflexed) (Collected: 10/10/2023  3:18 PM)  Result Value Ref Range   Enterococcus faecalis NOT DETECTED NOT DETECTED   Enterococcus Faecium NOT DETECTED NOT DETECTED   Listeria monocytogenes NOT DETECTED NOT DETECTED   Staphylococcus species DETECTED (A) NOT DETECTED   Staphylococcus aureus (BCID) NOT DETECTED NOT DETECTED   Staphylococcus epidermidis DETECTED (A) NOT DETECTED   Staphylococcus lugdunensis NOT DETECTED NOT DETECTED   Streptococcus species NOT DETECTED NOT DETECTED   Streptococcus agalactiae NOT DETECTED NOT DETECTED   Streptococcus pneumoniae NOT DETECTED NOT DETECTED   Streptococcus pyogenes NOT DETECTED NOT DETECTED   A.calcoaceticus-baumannii NOT DETECTED NOT DETECTED   Bacteroides fragilis NOT DETECTED NOT DETECTED   Enterobacterales NOT DETECTED NOT DETECTED   Enterobacter cloacae complex NOT DETECTED NOT DETECTED   Escherichia coli NOT DETECTED NOT DETECTED   Klebsiella aerogenes NOT DETECTED NOT DETECTED   Klebsiella oxytoca NOT DETECTED NOT DETECTED   Klebsiella pneumoniae NOT DETECTED NOT DETECTED   Proteus species  NOT DETECTED NOT DETECTED   Salmonella species NOT DETECTED NOT DETECTED   Serratia marcescens NOT DETECTED NOT DETECTED   Haemophilus influenzae NOT DETECTED NOT DETECTED   Neisseria meningitidis NOT DETECTED NOT DETECTED   Pseudomonas aeruginosa NOT DETECTED NOT DETECTED   Stenotrophomonas maltophilia NOT DETECTED NOT DETECTED   Candida albicans NOT DETECTED NOT DETECTED   Candida auris NOT DETECTED NOT DETECTED   Candida glabrata NOT DETECTED NOT DETECTED   Candida krusei NOT DETECTED NOT DETECTED   Candida parapsilosis NOT DETECTED NOT DETECTED   Candida tropicalis NOT DETECTED NOT DETECTED   Cryptococcus neoformans/gattii NOT DETECTED NOT DETECTED   Methicillin resistance mecA/C NOT DETECTED NOT DETECTED    [name] 10/11/2023  11:26 AM

## 2023-10-11 NOTE — Evaluation (Addendum)
 Clinical/Bedside Swallow Evaluation Patient Details  Name: Kelly [name] MRN: [id number] Date of Birth: 1934-10-18  Today's Date: 10/11/2023 Time: SLP Start Time (ACUTE ONLY): 0854 SLP Stop Time (ACUTE ONLY): 0909 SLP Time Calculation (min) (ACUTE ONLY): 15 min  Past Medical History:  Past Medical History:  Diagnosis Date   Acute respiratory failure with hypoxia (HCC) 05/04/2022   Chronic kidney disease    Stage III   Dementia with behavioral disturbance (HCC) 03/19/2015   Notated on FL2 Form from Dr. Aldean Amass [phone]   Depression    previously saw Dr Levie Ream   Diabetes mellitus without complication (HCC)    11/18/15 A1c 6%   Dyslipidemia    11/18/15 Tg 74, HDL 60, LDL 90 on statin   Fracture of radial head, closed 05/12/2021   05/09/2021 elbow films revealed possible subtle transversely oriented fracture left radial head.  Effusion and soft tissue edema noted over the olecranon.   Hypertension    Hypothyroidism    11/18/15 TSH 53.6, 02/07/16 TSH 9.42 @ [facility]   MDD (major depressive disorder)    With behavioral disturbance   Pneumonia 05/04/2022   Pneumonia due to COVID-19 virus 05/04/2022   Scabies    02/21/16 [facility],[place]; Rx: Permethrin cream   Urinary tract infection due to Proteus 05/04/2019   Uniformly sensitive except to nitrofurantoin; Cipro prescribed   UTI (urinary tract infection) 05/05/2019   Vitamin D  deficiency    11/18/15 vitamin D  25-hydroxy 39   Past Surgical History:  Past Surgical History:  Procedure Laterality Date   no record     04/10/15 [facility] note: see old chart   HPI:  Kelly [name] is a 89 y.o. female who presented to ED with tachycardia and fever. CXR 4/24: "Ill-defined lingular and left lower lobe infiltrates without  consolidations." Pt with medical history significant of dementia, bedbound status, hypertension, hyperlipidemia, hypothyroidism, CKD 3A, anemia, diabetes, anxiety, depression, mood disorder with psychosis.     Assessment / Plan / Recommendation  Clinical Impression  Pt presents with a mild oral dysphagia and clinical indicators of pharyngeal dysphagia.  Pt consumes a puree texture solids with thin liquid consistency at baseline per documentation from [place].  SLP provided oral care prior to administration of PO trials, xerostomia noted with brown appearance to dorsal surface of tongue.  Pt with congested cough on repositioning and rattling breath sounds noted.  Pt contracted on L side and cried out with repositioning. SLP removed thick secretions with suction catheter.  Pt tolerated small bites of puree and acheived adequate oral clearance.  With straw sips of thin liquid there was mild anterior loss on L  which may have been 2/2 positioning.  Multiple swallows noted (3+).  This persisted with nectar thick liquid as well.  There was no change in breath sounds with either consistency.  Silent aspiration cannot be ruled out at bedside, but pt positioning may be prohibitive to MBS.  Pt has been tolerating baseline diet without known recent pna.  Would recommend continuing PO intake with careful feeding.  SLP will follow for clinical management of dysphagia with low threshold for instrumental assessment if pt exhibits poor tolerance and it is within goals of care.    Recommend puree diet with thin liquid.   SLP Visit Diagnosis: Dysphagia, oropharyngeal phase (R13.12)    Aspiration Risk  Mild aspiration risk    Diet Recommendation Dysphagia 1 (Puree);Thin liquid    Liquid Administration via: Cup;Straw Medication Administration: Crushed with puree Supervision:  (1:1  feeding assistance) Compensations: Slow rate;Small sips/bites Postural Changes: Seated upright at 90 degrees    Other  Recommendations Oral Care Recommendations: Oral care BID    Recommendations for follow up therapy are one component of a multi-disciplinary discharge planning process, led by the attending physician.  Recommendations may  be updated based on patient status, additional functional criteria and insurance authorization.  Follow up Recommendations Skilled nursing-short term rehab (<3 hours/day)      Assistance Recommended at Discharge  N/A  Functional Status Assessment Patient has had a recent decline in their functional status and demonstrates the ability to make significant improvements in function in a reasonable and predictable amount of time.  Frequency and Duration min 2x/week  2 weeks       Prognosis Prognosis for improved oropharyngeal function: Good      Swallow Study   General Date of Onset: 10/10/23 HPI: Kelly [name] is a 89 y.o. female who presented to ED with tachycardia and fever. CXR 4/24: "Ill-defined lingular and left lower lobe infiltrates without  consolidations." Pt with medical history significant of dementia, bedbound status, hypertension, hyperlipidemia, hypothyroidism, CKD 3A, anemia, diabetes, anxiety, depression, mood disorder with psychosis. Type of Study: Bedside Swallow Evaluation Previous Swallow Assessment: BSE 05/04/2022 with recs for D3/thin Diet Prior to this Study: NPO Temperature Spikes Noted: No History of Recent Intubation: No Behavior/Cognition: Alert;Doesn't follow directions Oral Cavity Assessment: Dry;Dried secretions Oral Care Completed by SLP: Yes Oral Cavity - Dentition: Missing dentition Self-Feeding Abilities: Total assist Patient Positioning: Upright in bed Baseline Vocal Quality:  (Congested cough observed, rattling breath sounds) Volitional Cough: Cognitively unable to elicit Volitional Swallow: Unable to elicit    Oral/Motor/Sensory Function Overall Oral Motor/Sensory Function:  (Unable to assess)   Ice Chips Ice chips: Not tested   Thin Liquid Thin Liquid: Impaired Oral Phase Functional Implications: Left anterior spillage Pharyngeal  Phase Impairments: Multiple swallows    Nectar Thick Nectar Thick Liquid: Impaired Pharyngeal Phase Impairments:  Multiple swallows   Honey Thick Honey Thick Liquid: Not tested   Puree Puree: Within functional limits Presentation: Spoon   Solid     Solid: Not tested      Kelly Grim, MA, CCC-SLP Acute Rehabilitation Services Office: [phone] 10/11/2023,10:10 AM

## 2023-10-12 ENCOUNTER — Inpatient Hospital Stay (HOSPITAL_COMMUNITY)

## 2023-10-12 DIAGNOSIS — A419 Sepsis, unspecified organism: Secondary | ICD-10-CM | POA: Diagnosis not present

## 2023-10-12 LAB — COMPREHENSIVE METABOLIC PANEL WITH GFR
ALT: 27 U/L (ref 0–44)
AST: 41 U/L (ref 15–41)
Albumin: 1.8 g/dL — ABNORMAL LOW (ref 3.5–5.0)
Alkaline Phosphatase: 46 U/L (ref 38–126)
Anion gap: 9 (ref 5–15)
BUN: 20 mg/dL (ref 8–23)
CO2: 28 mmol/L (ref 22–32)
Calcium: 9.1 mg/dL (ref 8.9–10.3)
Chloride: 116 mmol/L — ABNORMAL HIGH (ref 98–111)
Creatinine, Ser: 0.78 mg/dL (ref 0.44–1.00)
GFR, Estimated: >60 mL/min (ref >60)
Glucose, Bld: 93 mg/dL (ref 70–99)
Potassium: 3.7 mmol/L (ref 3.5–5.1)
Sodium: 153 mmol/L — ABNORMAL HIGH (ref 135–145)
Total Bilirubin: 0.5 mg/dL (ref 0.0–1.2)
Total Protein: 5.9 g/dL — ABNORMAL LOW (ref 6.5–8.1)

## 2023-10-12 LAB — CBC
HCT: 34.1 % — ABNORMAL LOW (ref 36.0–46.0)
Hemoglobin: 11.1 g/dL — ABNORMAL LOW (ref 12.0–15.0)
MCH: 29.9 pg (ref 26.0–34.0)
MCHC: 32.6 g/dL (ref 30.0–36.0)
MCV: 91.9 fL (ref 80.0–100.0)
Platelets: 219 10*3/uL (ref 150–400)
RBC: 3.71 MIL/uL — ABNORMAL LOW (ref 3.87–5.11)
RDW: 14.6 % (ref 11.5–15.5)
WBC: 16.3 10*3/uL — ABNORMAL HIGH (ref 4.0–10.5)
nRBC: 0 % (ref 0.0–0.2)

## 2023-10-12 LAB — PROCALCITONIN: Procalcitonin: 1.33 ng/mL

## 2023-10-12 LAB — GLUCOSE, CAPILLARY
Glucose-Capillary: 104 mg/dL — ABNORMAL HIGH (ref 70–99)
Glucose-Capillary: 109 mg/dL — ABNORMAL HIGH (ref 70–99)
Glucose-Capillary: 123 mg/dL — ABNORMAL HIGH (ref 70–99)
Glucose-Capillary: 125 mg/dL — ABNORMAL HIGH (ref 70–99)
Glucose-Capillary: 92 mg/dL (ref 70–99)
Glucose-Capillary: 99 mg/dL (ref 70–99)

## 2023-10-12 LAB — LACTIC ACID, PLASMA: Lactic Acid, Venous: 1.4 mmol/L (ref 0.5–1.9)

## 2023-10-12 LAB — C-REACTIVE PROTEIN: CRP: 26.5 mg/dL — ABNORMAL HIGH (ref <1.0)

## 2023-10-12 LAB — PHOSPHORUS: Phosphorus: 3.7 mg/dL (ref 2.5–4.6)

## 2023-10-12 LAB — MAGNESIUM: Magnesium: 2.2 mg/dL (ref 1.7–2.4)

## 2023-10-12 MED ORDER — SODIUM CHLORIDE 0.9 % IV SOLN
3.0000 g | Freq: Four times a day (QID) | INTRAVENOUS | Status: DC
  Administered 2023-10-12 – 2023-10-13 (×4): 3 g via INTRAVENOUS
  Filled 2023-10-12 (×4): qty 8

## 2023-10-12 MED ORDER — DEXTROSE 5 % IV SOLN: INTRAVENOUS | Status: DC

## 2023-10-12 NOTE — Progress Notes (Signed)
 Speech Language Pathology Treatment: Dysphagia  Patient Details Name: Kelly [name] MRN: [id number] DOB: [birth year] Today's Date: 10/12/2023 Time: 0920-0930 SLP Time Calculation (min) (ACUTE ONLY): 10 min  Assessment / Plan / Recommendation Clinical Impression  Per chart, nursing attempted to give pt meds crushed in puree last night and she exhibited oral holding until bolus was removed. Pt was made NPO as a result. Nurse this morning says she saw similar oral holding, but that pt did swallow when given a liquid wash. SLP saw pt this morning and also offered purees and thin liquids. A significant oral dysphagia is observe with purees, with minimal mouth opening to accept a spoon and anterior holding of purees without initiation of posterior transit. Question if this could be related to a change in mentation, but do note that this seems to be a consistent problem since last night. Pt does swallow with more automaticity with thin liquids, clearing purees from her oral cavity and showing anterior loss only one time. There is no overt coughing. Agree with evaluating SLP that positioning is likely prohibitive to attempting MBS to get more of direct view of swallowing. Discussed with MD and he is okay with resuming liquids only at this time. Will order clear liquid diet so that purees are not sent on her meal trays, but she can have any type of thin liquid. For meds, would still crush in puree, but would use liquid wash to clear oral cavity.    HPI HPI: Kelly [name] is a 89 y.o. female who presented to ED with tachycardia and fever. CXR 4/24: "Ill-defined lingular and left lower lobe infiltrates without  consolidations." Pt with medical history significant of dementia, bedbound status, hypertension, hyperlipidemia, hypothyroidism, CKD 3A, anemia, diabetes, anxiety, depression, mood disorder with psychosis.      SLP Plan  Continue with current plan of care      Recommendations for follow up therapy are one  component of a multi-disciplinary discharge planning process, led by the attending physician.  Recommendations may be updated based on patient status, additional functional criteria and insurance authorization.    Recommendations  Diet recommendations: Thin liquid Liquids provided via: Straw;Cup Medication Administration: Crushed with puree Supervision: Full supervision/cueing for compensatory strategies;Staff to assist with self feeding Compensations: Slow rate;Small sips/bites;Follow solids with liquid Postural Changes and/or Swallow Maneuvers: Seated upright 90 degrees                  Oral care BID   None Dysphagia, oropharyngeal phase (R13.12)     Continue with current plan of care     Beth Brooke., M.A. CCC-SLP Acute Rehabilitation Services Office: [phone]  Secure chat preferred   10/12/2023, 11:36 AM

## 2023-10-12 NOTE — Plan of Care (Signed)
  Problem: Clinical Measurements: Goal: Diagnostic test results will improve Outcome: Progressing Goal: Signs and symptoms of infection will decrease Outcome: Progressing   Problem: Coping: Goal: Ability to adjust to condition or change in health will improve Outcome: Progressing   Problem: Nutritional: Goal: Maintenance of adequate nutrition will improve Outcome: Progressing   Problem: Skin Integrity: Goal: Risk for impaired skin integrity will decrease Outcome: Progressing   Problem: Tissue Perfusion: Goal: Adequacy of tissue perfusion will improve Outcome: Progressing   Problem: Clinical Measurements: Goal: Will remain free from infection Outcome: Progressing

## 2023-10-12 NOTE — Progress Notes (Signed)
 PROGRESS NOTE                                                                                                                                                                                                             Patient Demographics:    Kelly [name], is a 89 y.o. female, DOB - 05-16-1935, ZOX:[id number]  Outpatient Primary MD for the patient is Pcp, No    LOS - 2  Admit date - 10/10/2023    Chief Complaint  Patient presents with   Code Sepsis       Brief Narrative (HPI from H&P)   89 y.o. female with medical history significant of dementia, bedbound status, hypertension, hyperlipidemia, hypothyroidism, CKD 3A, anemia, diabetes, anxiety, depression, mood disorder with psychosis presenting with tachycardia and fever.   Routine with assistance of chart review and family.  EMS was initially called out to evaluate patient for tachycardia.  She is found to be tachycardic, tachypneic and febrile.  Family reports that patient has had a cough for several days and has had increased cough with feeding.  She is on pured diet.  Was admitted with sepsis likely due to aspiration pneumonia.   Subjective:    Kelly [name] today in bed, will not answer questions or follow commands due to underlying advanced dementia.   Assessment  & Plan :    Sepsis due to [place] aspiration pneumonia.  History of advanced dementia, dysphagia, on pured diet. - Patient has longstanding history of advanced dementia and gradually declining, now has evidence of sepsis due to aspiration pneumonia, MRSA nasal PCR negative, blood culture suggestive of contamination, follow final cultures, speech therapy to follow, gentle IV fluids and IV antibiotics.  Long-term prognosis is poor.  Will involve palliative care, had long discussion with patient's daughters.  DNR.   Anxiety/depression/dementia  - Namenda  10 mg PO bid - Remeron  7.5 mg PO at bedtime  -  Valproate 250 mg q12    Resume once taking oral.   Dysphagia  - On pured diet at baseline, speech therapy following, had to be made n.p.o. again on 10/11/2023, will request speech to follow.   HTN  - Monitor, as needed IV hydralazine   HLD  - Lipitor 40 mg PO at bedtime once taking p.o.   Hypothyroidism  - Synthroid  137 mcg PO daily once taking p.o.  HyperNa+ - Severe dehydration D5W   Diabetes  - Novolog  SS q4 hr   Lab Results  Component Value Date   HGBA1C 6.0 (H) 10/10/2023   CBG (last 3)  Recent Labs    10/11/23 2317 10/12/23 0309 10/12/23 0805  GLUCAP 98 92 99         Condition - Extremely Guarded  Family Communication  :    Daughter Adah Acron [phone]  - on 10/12/23  Code Status :  DNR  Consults  :  Pall care  PUD Prophylaxis :     Procedures  :            Disposition Plan  :    Status is: Inpatient   DVT Prophylaxis  :    enoxaparin  (LOVENOX ) injection 40 mg Start: 10/10/23 2000    Lab Results  Component Value Date   PLT 219 10/12/2023    Diet :  Diet Order             Diet NPO time specified  Diet effective now                    Inpatient Medications  Scheduled Meds:  atorvastatin   40 mg Oral QHS   enoxaparin  (LOVENOX ) injection  40 mg Subcutaneous Q24H   feeding supplement  237 mL Oral BID BM   insulin  aspart  0-9 Units Subcutaneous Q4H   leptospermum manuka honey  1 Application Topical Daily   levothyroxine   137 mcg Oral QAC breakfast   memantine   10 mg Oral BID   mirtazapine   7.5 mg Oral QHS   sodium chloride  flush  3 mL Intravenous Q12H   Continuous Infusions:  ceFEPime  (MAXIPIME ) IV 2 g (10/12/23 0632)   dextrose  110 mL/hr at 10/12/23 0640   metronidazole  500 mg (10/12/23 0828)   valproate sodium  250 mg (10/12/23 0831)   vancomycin  750 mg (10/11/23 1716)   PRN Meds:.acetaminophen  **OR** acetaminophen , albuterol , polyethylene glycol  Antibiotics  :    Anti-infectives (From admission, onward)     Start     Dose/Rate Route Frequency Ordered Stop   10/11/23 1800  vancomycin  (VANCOREADY) IVPB 750 mg/150 mL        750 mg 150 mL/hr over 60 Minutes Intravenous Every 24 hours 10/10/23 1938     10/11/23 0400  ceFEPIme  (MAXIPIME ) 2 g in sodium chloride  0.9 % 100 mL IVPB        2 g 200 mL/hr over 30 Minutes Intravenous Every 12 hours 10/10/23 1932     10/10/23 2200  metroNIDAZOLE  (FLAGYL ) IVPB 500 mg        500 mg 100 mL/hr over 60 Minutes Intravenous Every 12 hours 10/10/23 1859     10/10/23 1615  Vancomycin  (VANCOCIN ) 1,250 mg in sodium chloride  0.9 % 250 mL IVPB        1,250 mg 166.7 mL/hr over 90 Minutes Intravenous  Once 10/10/23 1606 10/10/23 1756   10/10/23 1530  vancomycin  (VANCOREADY) IVPB 1250 mg/250 mL  Status:  Discontinued        1,250 mg 166.7 mL/hr over 90 Minutes Intravenous  Once 10/10/23 1520 10/10/23 1606   10/10/23 1515  ceFEPIme  (MAXIPIME ) 2 g in sodium chloride  0.9 % 100 mL IVPB        2 g 200 mL/hr over 30 Minutes Intravenous  Once 10/10/23 1514 10/10/23 1609   10/10/23 1515  metroNIDAZOLE  (FLAGYL ) IVPB 500 mg        500 mg 100 mL/hr over  60 Minutes Intravenous  Once 10/10/23 1514 10/10/23 1704   10/10/23 1515  vancomycin  (VANCOCIN ) IVPB 1000 mg/200 mL premix  Status:  Discontinued        1,000 mg 200 mL/hr over 60 Minutes Intravenous  Once 10/10/23 1514 10/10/23 1520         Objective:   Vitals:   10/12/23 0000 10/12/23 0100 10/12/23 0305 10/12/23 0800  BP:   102/61 124/62  Pulse:   67 72  Resp: 20 20 20 19   Temp:   99 F (37.2 C) 97.9 F (36.6 C)  TempSrc:   Axillary Oral  SpO2:   96% 94%    Wt Readings from Last 3 Encounters:  11/17/22 80 kg  05/08/22 82.4 kg  11/07/21 75.7 kg     Intake/Output Summary (Last 24 hours) at 10/12/2023 0832 Last data filed at 10/12/2023 6213 Gross per 24 hour  Intake 150 ml  Output 600 ml  Net -450 ml     Physical Exam  Awake but minimally responsive, No new F.N deficits,   [place].AT,PERRAL Supple  Neck, No JVD,   Symmetrical Chest wall movement, Good air movement bilaterally, CTAB RRR,No Gallops,Rubs or new Murmurs,  +ve B.Sounds, Abd Soft, No tenderness,   Chronic lower extremity contractures, chronic heel pressure ulcers present on admission with bandage on top of it    RN pressure injury documentation: Pressure Injury 10/11/23 Hip Anterior;Right Stage 4 - Full thickness tissue loss with exposed bone, tendon or muscle. (Active)  10/11/23 1205  Location: Hip  Location Orientation: Anterior;Right  Staging: Stage 4 - Full thickness tissue loss with exposed bone, tendon or muscle.  Wound Description (Comments):   Present on Admission: Yes     Pressure Injury 10/11/23 Buttocks Right Stage 1 -  Intact skin with non-blanchable redness of a localized area usually over a bony prominence. (Active)  10/11/23 1205  Location: Buttocks  Location Orientation: Right  Staging: Stage 1 -  Intact skin with non-blanchable redness of a localized area usually over a bony prominence.  Wound Description (Comments):   Present on Admission: Yes      Data Review:    Recent Labs  Lab 10/10/23 1514 10/11/23 0320 10/12/23 0420  WBC 5.7 12.4* 16.3*  HGB 20.3* 10.3* 11.1*  HCT 62.6* 31.7* 34.1*  PLT 188 196 219  MCV 93.4 93.8 91.9  MCH 30.3 30.5 29.9  MCHC 32.4 32.5 32.6  RDW 16.3* 14.6 14.6  LYMPHSABS 1.1  --   --   MONOABS 0.6  --   --   EOSABS 0.0  --   --   BASOSABS 0.0  --   --     Recent Labs  Lab 10/10/23 1514 10/10/23 1537 10/10/23 1850 10/10/23 1900 10/10/23 2030 10/11/23 0320 10/11/23 0332 10/12/23 0420 10/12/23 0427  NA 150*  --   --   --   --  150*  --  153*  --   K 4.2  --   --   --   --  3.8  --  3.7  --   CL 112*  --   --   --   --  115*  --  116*  --   CO2 26  --   --   --   --  27  --  28  --   ANIONGAP 12  --   --   --   --  8  --  9  --   GLUCOSE 132*  --   --   --   --  115*  --  93  --   BUN 27*  --   --   --   --  20  --  20  --   CREATININE 1.17*  --    --   --   --  0.79  --  0.78  --   AST 63*  --   --   --   --  35  --  41  --   ALT 43  --   --   --   --  28  --  27  --   ALKPHOS 56  --   --   --   --  37*  --  46  --   BILITOT 0.5  --   --   --   --  0.7  --  0.5  --   ALBUMIN 2.4*  --   --   --   --  1.6*  --  1.8*  --   CRP  --   --   --   --   --   --   --  26.5*  --   PROCALCITON  --   --   --  0.95  --  1.69  --   --   --   LATICACIDVEN  --  3.4* 7.7*  --  5.3*  --  2.0*  --  1.4  INR 1.3*  --   --   --   --  1.7*  --   --   --   HGBA1C  --   --   --  6.0*  --   --   --   --   --   MG  --   --   --   --   --   --   --  2.2  --   PHOS  --   --   --   --   --   --   --  3.7  --   CALCIUM  9.3  --   --   --   --  8.7*  --  9.1  --      Recent Labs  Lab 10/10/23 1514 10/10/23 1537 10/10/23 1850 10/10/23 1900 10/10/23 2030 10/11/23 0320 10/11/23 0332 10/12/23 0420 10/12/23 0427  CRP  --   --   --   --   --   --   --  26.5*  --   PROCALCITON  --   --   --  0.95  --  1.69  --   --   --   LATICACIDVEN  --  3.4* 7.7*  --  5.3*  --  2.0*  --  1.4  INR 1.3*  --   --   --   --  1.7*  --   --   --   HGBA1C  --   --   --  6.0*  --   --   --   --   --   MG  --   --   --   --   --   --   --  2.2  --   CALCIUM  9.3  --   --   --   --  8.7*  --  9.1  --    Lab Results  Component Value Date   CHOL 182 06/27/2021   HDL 62 06/27/2021   LDLCALC 89 06/27/2021   TRIG 156 06/27/2021  Lab Results  Component Value Date   HGBA1C 6.0 (H) 10/10/2023     Micro Results Recent Results (from the past 240 hours)  Resp panel by RT-PCR (RSV, Flu A&B, Covid) Anterior Nasal Swab     Status: None   Collection Time: 10/10/23  3:14 PM   Specimen: Anterior Nasal Swab  Result Value Ref Range Status   SARS Coronavirus 2 by RT PCR NEGATIVE NEGATIVE Final   Influenza A by PCR NEGATIVE NEGATIVE Final   Influenza B by PCR NEGATIVE NEGATIVE Final    Comment: (NOTE) The Xpert Xpress SARS-CoV-2/FLU/RSV plus assay is intended as an aid in the  diagnosis of influenza from Nasopharyngeal swab specimens and should not be used as a sole basis for treatment. Nasal washings and aspirates are unacceptable for Xpert Xpress SARS-CoV-2/FLU/RSV testing.  Fact Sheet for Patients: BloggerCourse.com  Fact Sheet for Healthcare Providers: SeriousBroker.it  This test is not yet approved or cleared by the [place]  FDA and has been authorized for detection and/or diagnosis of SARS-CoV-2 by FDA under an Emergency Use Authorization (EUA). This EUA will remain in effect (meaning this test can be used) for the duration of the COVID-19 declaration under Section 564(b)(1) of the Act, 21 U.S.C. section 360bbb-3(b)(1), unless the authorization is terminated or revoked.     Resp Syncytial Virus by PCR NEGATIVE NEGATIVE Final    Comment: (NOTE) Fact Sheet for Patients: BloggerCourse.com  Fact Sheet for Healthcare Providers: SeriousBroker.it  This test is not yet approved or cleared by the [place]  FDA and has been authorized for detection and/or diagnosis of SARS-CoV-2 by FDA under an Emergency Use Authorization (EUA). This EUA will remain in effect (meaning this test can be used) for the duration of the COVID-19 declaration under Section 564(b)(1) of the Act, 21 U.S.C. section 360bbb-3(b)(1), unless the authorization is terminated or revoked.  Performed at [facility], [address] [place], [place] [address]   Blood Culture (routine x 2)     Status: None (Preliminary result)   Collection Time: 10/10/23  3:18 PM   Specimen: BLOOD RIGHT ARM  Result Value Ref Range Status   Specimen Description BLOOD RIGHT ARM  Final   Special Requests   Final    BOTTLES DRAWN AEROBIC AND ANAEROBIC Blood Culture results may not be optimal due to an inadequate volume of blood received in culture bottles   Culture  Setup Time   Final    GRAM  POSITIVE COCCI IN CLUSTERS IN BOTH AEROBIC AND ANAEROBIC BOTTLES CRITICAL RESULT CALLED TO, READ BACK BY AND VERIFIED WITH: PHARMD AMANDA KAN [id number] AT 1104 BY EC Performed at [facility], [address], [place], [place] [address]    Culture GRAM POSITIVE COCCI IN CLUSTERS  Final   Report Status PENDING  Incomplete  Blood Culture (routine x 2)     Status: None (Preliminary result)   Collection Time: 10/10/23  3:18 PM   Specimen: BLOOD RIGHT ARM  Result Value Ref Range Status   Specimen Description BLOOD RIGHT ARM  Final   Special Requests   Final    BOTTLES DRAWN AEROBIC AND ANAEROBIC Blood Culture adequate volume   Culture  Setup Time   Final    GRAM POSITIVE COCCI AEROBIC BOTTLE ONLY CRITICAL VALUE NOTED.  VALUE IS CONSISTENT WITH PREVIOUSLY REPORTED AND CALLED VALUE. Performed at [facility], [address] [place], [place] [address]    Culture [facility] POSITIVE COCCI  Final   Report  Status PENDING  Incomplete  Blood Culture ID Panel (Reflexed)     Status: Abnormal   Collection Time: 10/10/23  3:18 PM  Result Value Ref Range Status   Enterococcus faecalis NOT DETECTED NOT DETECTED Final   Enterococcus Faecium NOT DETECTED NOT DETECTED Final   Listeria monocytogenes NOT DETECTED NOT DETECTED Final   Staphylococcus species DETECTED (A) NOT DETECTED Final    Comment: CRITICAL RESULT CALLED TO, READ BACK BY AND VERIFIED WITH: PHARMD AMANDA KAN [id number] AT 1104 BY EC    Staphylococcus aureus (BCID) NOT DETECTED NOT DETECTED Final   Staphylococcus epidermidis DETECTED (A) NOT DETECTED Final    Comment: CRITICAL RESULT CALLED TO, READ BACK BY AND VERIFIED WITH: PHARMD AMANDA KAN [phone] AT 1104 BY EC    Staphylococcus lugdunensis NOT DETECTED NOT DETECTED Final   Streptococcus species NOT DETECTED NOT DETECTED Final   Streptococcus agalactiae NOT DETECTED NOT DETECTED Final   Streptococcus pneumoniae NOT DETECTED NOT DETECTED Final   Streptococcus pyogenes NOT  DETECTED NOT DETECTED Final   A.calcoaceticus-baumannii NOT DETECTED NOT DETECTED Final   Bacteroides fragilis NOT DETECTED NOT DETECTED Final   Enterobacterales NOT DETECTED NOT DETECTED Final   Enterobacter cloacae complex NOT DETECTED NOT DETECTED Final   Escherichia coli NOT DETECTED NOT DETECTED Final   Klebsiella aerogenes NOT DETECTED NOT DETECTED Final   Klebsiella oxytoca NOT DETECTED NOT DETECTED Final   Klebsiella pneumoniae NOT DETECTED NOT DETECTED Final   Proteus species NOT DETECTED NOT DETECTED Final   Salmonella species NOT DETECTED NOT DETECTED Final   Serratia marcescens NOT DETECTED NOT DETECTED Final   Haemophilus influenzae NOT DETECTED NOT DETECTED Final   Neisseria meningitidis NOT DETECTED NOT DETECTED Final   Pseudomonas aeruginosa NOT DETECTED NOT DETECTED Final   Stenotrophomonas maltophilia NOT DETECTED NOT DETECTED Final   Candida albicans NOT DETECTED NOT DETECTED Final   Candida auris NOT DETECTED NOT DETECTED Final   Candida glabrata NOT DETECTED NOT DETECTED Final   Candida krusei NOT DETECTED NOT DETECTED Final   Candida parapsilosis NOT DETECTED NOT DETECTED Final   Candida tropicalis NOT DETECTED NOT DETECTED Final   Cryptococcus neoformans/gattii NOT DETECTED NOT DETECTED Final   Methicillin resistance mecA/C NOT DETECTED NOT DETECTED Final    Comment: Performed at [facility], [address] [place], [place] [address]  Respiratory (~20 pathogens) panel by PCR     Status: None   Collection Time: 10/10/23  7:00 PM   Specimen: Nasopharyngeal Swab; Respiratory  Result Value Ref Range Status   Adenovirus NOT DETECTED NOT DETECTED Final   Coronavirus 229E NOT DETECTED NOT DETECTED Final    Comment: (NOTE) The Coronavirus on the Respiratory Panel, DOES NOT test for the novel  Coronavirus (2019 nCoV)    Coronavirus HKU1 NOT DETECTED NOT DETECTED Final   Coronavirus NL63 NOT DETECTED NOT DETECTED Final   Coronavirus OC43 NOT DETECTED NOT  DETECTED Final   Metapneumovirus NOT DETECTED NOT DETECTED Final   Rhinovirus / Enterovirus NOT DETECTED NOT DETECTED Final   Influenza A NOT DETECTED NOT DETECTED Final   Influenza B NOT DETECTED NOT DETECTED Final   Parainfluenza Virus 1 NOT DETECTED NOT DETECTED Final   Parainfluenza Virus 2 NOT DETECTED NOT DETECTED Final   Parainfluenza Virus 3 NOT DETECTED NOT DETECTED Final   Parainfluenza Virus 4 NOT DETECTED NOT DETECTED Final   Respiratory Syncytial Virus NOT DETECTED NOT DETECTED Final   Bordetella pertussis NOT DETECTED NOT DETECTED Final   Bordetella Parapertussis  NOT DETECTED NOT DETECTED Final   Chlamydophila pneumoniae NOT DETECTED NOT DETECTED Final   Mycoplasma pneumoniae NOT DETECTED NOT DETECTED Final    Comment: Performed at [facility], [address], [place], [place] [address]  MRSA Next Gen by PCR, Nasal     Status: None   Collection Time: 10/11/23  1:29 PM   Specimen: Nasal Mucosa; Nasal Swab  Result Value Ref Range Status   MRSA by PCR Next Gen NOT DETECTED NOT DETECTED Final    Comment: (NOTE) The GeneXpert MRSA Assay (FDA approved for NASAL specimens only), is one component of a comprehensive MRSA colonization surveillance program. It is not intended to diagnose MRSA infection nor to guide or monitor treatment for MRSA infections. Test performance is not FDA approved in patients less than 6 years old. Performed at [facility], [address] [place], [place] [address]     Radiology Report    DG Chest Port 1 View Result Date: 10/12/2023 CLINICAL DATA:  Shortness of breath. EXAM: PORTABLE CHEST 1 VIEW COMPARISON:  10/10/2023 FINDINGS: Stable cardiomediastinal contours. Aortic atherosclerosis. There is been interval worsening aeration to the left lower lung compared with the previous exam. This may reflect progressive atelectasis or consolidation and or underlying new pleural effusion. Right lung appears clear. IMPRESSION: Interval  worsening aeration to the left lower lung compared with the previous exam. This may reflect progressive atelectasis/consolidation and/or underlying new pleural effusion. Electronically Signed   By: Kimberley Penman M.D.   On: 10/12/2023 06:59   DG Chest Port 1 View Result Date: 10/10/2023 CLINICAL DATA:  Sepsis EXAM: PORTABLE CHEST 1 VIEW COMPARISON:  May 29, 2023 FINDINGS: Ill-defined lingular and left lower lobe infiltrates without consolidations Right lung clear Heart and mediastinum normal No pleural effusions IMPRESSION: Ill-defined lingular and left lower lobe infiltrates without consolidations. Electronically Signed   By: Fredrich Jefferson M.D.   On: 10/10/2023 15:58     Signature  -   Lynnwood Sauer M.D on 10/12/2023 at 8:32 AM   -  To page go to [email or web address]

## 2023-10-12 NOTE — Plan of Care (Signed)

## 2023-10-12 NOTE — Progress Notes (Signed)
 Physical Therapy Note  Spoke with family who provided PLOF. Pt from LTC at [facility]. Staff use hoyer lift to place her in a geri-chair but otherwise remains in bed. BIL LE contractures. Has had PT in the past there but no longer receiving. Opted to keep pt comfortable and forego formal acute PT evaluation as she appears to be at baseline. Recommend return to prior level of care at [facility].    Jory Ng, PT, DPT [facility]  Rehabilitation Services Physical Therapist Office: [phone] Website: [email or web address]

## 2023-10-13 ENCOUNTER — Inpatient Hospital Stay (HOSPITAL_COMMUNITY)

## 2023-10-13 DIAGNOSIS — Z515 Encounter for palliative care: Secondary | ICD-10-CM

## 2023-10-13 DIAGNOSIS — Z7189 Other specified counseling: Secondary | ICD-10-CM

## 2023-10-13 DIAGNOSIS — A419 Sepsis, unspecified organism: Secondary | ICD-10-CM | POA: Diagnosis not present

## 2023-10-13 LAB — PROCALCITONIN: Procalcitonin: 0.65 ng/mL

## 2023-10-13 LAB — CBC
HCT: 31.8 % — ABNORMAL LOW (ref 36.0–46.0)
Hemoglobin: 10.6 g/dL — ABNORMAL LOW (ref 12.0–15.0)
MCH: 30.3 pg (ref 26.0–34.0)
MCHC: 33.3 g/dL (ref 30.0–36.0)
MCV: 90.9 fL (ref 80.0–100.0)
Platelets: 206 10*3/uL (ref 150–400)
RBC: 3.5 MIL/uL — ABNORMAL LOW (ref 3.87–5.11)
RDW: 14.5 % (ref 11.5–15.5)
WBC: 15.6 10*3/uL — ABNORMAL HIGH (ref 4.0–10.5)
nRBC: 0 % (ref 0.0–0.2)

## 2023-10-13 LAB — CULTURE, BLOOD (ROUTINE X 2)

## 2023-10-13 LAB — GLUCOSE, CAPILLARY
Glucose-Capillary: 121 mg/dL — ABNORMAL HIGH (ref 70–99)
Glucose-Capillary: 85 mg/dL (ref 70–99)
Glucose-Capillary: 87 mg/dL (ref 70–99)

## 2023-10-13 LAB — BRAIN NATRIURETIC PEPTIDE: B Natriuretic Peptide: 508.5 pg/mL — ABNORMAL HIGH (ref 0.0–100.0)

## 2023-10-13 LAB — COMPREHENSIVE METABOLIC PANEL WITH GFR
ALT: 22 U/L (ref 0–44)
AST: 33 U/L (ref 15–41)
Albumin: 1.7 g/dL — ABNORMAL LOW (ref 3.5–5.0)
Alkaline Phosphatase: 44 U/L (ref 38–126)
Anion gap: 10 (ref 5–15)
BUN: 14 mg/dL (ref 8–23)
CO2: 24 mmol/L (ref 22–32)
Calcium: 8.4 mg/dL — ABNORMAL LOW (ref 8.9–10.3)
Chloride: 112 mmol/L — ABNORMAL HIGH (ref 98–111)
Creatinine, Ser: 0.68 mg/dL (ref 0.44–1.00)
GFR, Estimated: >60 mL/min (ref >60)
Glucose, Bld: 92 mg/dL (ref 70–99)
Potassium: 3.1 mmol/L — ABNORMAL LOW (ref 3.5–5.1)
Sodium: 146 mmol/L — ABNORMAL HIGH (ref 135–145)
Total Bilirubin: 0.5 mg/dL (ref 0.0–1.2)
Total Protein: 5.5 g/dL — ABNORMAL LOW (ref 6.5–8.1)

## 2023-10-13 LAB — URINALYSIS, W/ REFLEX TO CULTURE (INFECTION SUSPECTED)
Bacteria, UA: NONE SEEN
Bilirubin Urine: NEGATIVE
Glucose, UA: NEGATIVE mg/dL
Hgb urine dipstick: NEGATIVE
Ketones, ur: NEGATIVE mg/dL
Leukocytes,Ua: NEGATIVE
Nitrite: NEGATIVE
Protein, ur: 30 mg/dL — AB
Specific Gravity, Urine: 1.029 (ref 1.005–1.030)
pH: 5 (ref 5.0–8.0)

## 2023-10-13 LAB — C-REACTIVE PROTEIN: CRP: 17.4 mg/dL — ABNORMAL HIGH (ref <1.0)

## 2023-10-13 LAB — MAGNESIUM: Magnesium: 1.9 mg/dL (ref 1.7–2.4)

## 2023-10-13 LAB — PHOSPHORUS: Phosphorus: 2.5 mg/dL (ref 2.5–4.6)

## 2023-10-13 MED ORDER — VANCOMYCIN HCL IN DEXTROSE 1-5 GM/200ML-% IV SOLN: 1000.0000 mg | INTRAVENOUS | Status: DC

## 2023-10-13 MED ORDER — GLYCOPYRROLATE 0.2 MG/ML IJ SOLN: 0.2000 mg | INTRAMUSCULAR | Status: DC | PRN

## 2023-10-13 MED ORDER — MORPHINE SULFATE (PF) 2 MG/ML IV SOLN
2.0000 mg | INTRAVENOUS | Status: DC | PRN
  Administered 2023-10-13 (×2): 2 mg via INTRAVENOUS
  Filled 2023-10-13: qty 1

## 2023-10-13 MED ORDER — LORAZEPAM 2 MG/ML IJ SOLN: 0.5000 mg | INTRAMUSCULAR | Status: DC | PRN

## 2023-10-13 MED ORDER — ONDANSETRON HCL 4 MG/2ML IJ SOLN: 4.0000 mg | Freq: Four times a day (QID) | INTRAMUSCULAR | Status: DC | PRN

## 2023-10-13 MED ORDER — MORPHINE SULFATE (PF) 2 MG/ML IV SOLN
2.0000 mg | Freq: Four times a day (QID) | INTRAVENOUS | Status: DC
  Filled 2023-10-13: qty 1

## 2023-10-13 MED ORDER — BIOTENE DRY MOUTH MT LIQD: 15.0000 mL | OROMUCOSAL | Status: DC | PRN

## 2023-10-13 MED ORDER — POLYVINYL ALCOHOL 1.4 % OP SOLN: 1.0000 [drp] | Freq: Four times a day (QID) | OPHTHALMIC | Status: DC | PRN

## 2023-10-13 MED ORDER — ONDANSETRON 4 MG PO TBDP: 4.0000 mg | ORAL_TABLET | Freq: Four times a day (QID) | ORAL | Status: DC | PRN

## 2023-10-13 MED ORDER — GLYCOPYRROLATE 1 MG PO TABS: 1.0000 mg | ORAL_TABLET | ORAL | Status: DC | PRN

## 2023-10-13 NOTE — Progress Notes (Signed)
 [facility] picked patient up for transport to [facility].  Daughter Adah Acron called and updated,

## 2023-10-13 NOTE — Discharge Summary (Signed)
 Kelly [name] UEA:[id number] DOB: 10/26/1934 DOA: 10/10/2023  PCP: Pcp, No  Admit date: 10/10/2023  Discharge date: 10/13/2023  Admitted From: Home   Disposition:  Residential Hospice   Recommendations for Outpatient Follow-up:   Follow up with PCP in 1-2 weeks  PCP Please obtain BMP/CBC, 2 view CXR in 1week,  (see Discharge instructions)   PCP Please follow up on the following pending results:    Disposition.  Residential hospice Condition.  Guarded CODE STATUS.  DNR Activity.  With assistance as tolerated, full fall precautions. Diet.  Soft with feeding assistance and aspiration precautions. Goal of care.  Comfort.    Chief Complaint  Patient presents with   Code Sepsis     Brief history of present illness from the day of admission and additional interim summary    89 y.o. female with medical history significant of dementia, bedbound status, hypertension, hyperlipidemia, hypothyroidism, CKD 3A, anemia, diabetes, anxiety, depression, mood disorder with psychosis presenting with tachycardia and fever.   Routine with assistance of chart review and family.  EMS was initially called out to evaluate patient for tachycardia.  She is found to be tachycardic, tachypneic and febrile.  Family reports that patient has had a cough for several days and has had increased cough with feeding.  She is on pured diet.  Was admitted with sepsis likely due to aspiration pneumonia.  She had a poor baseline with poor prognosis, Pall care was involved, now full comfort care >> Residential Hospice. Only comfort Meds.                                                                 Hospital Course     Now full comfort care >> Residential hospice, comfort Meds only, other issues addressed earlier this admission are  below.     Sepsis due to aspiration pneumonia.  History of advanced dementia, dysphagia, on pured diet. - Patient has longstanding history of advanced dementia and gradually declining, now has evidence of sepsis due to aspiration pneumonia, MRSA nasal PCR negative, blood culture suggestive of contamination, follow final cultures so far 1 set out of 2 growing gram-positive cocci in clusters, speech therapy to follow, gentle IV fluids and IV antibiotics, currently on vancomycin  and Unasyn.  Long-term prognosis is poor.  Await input by palliative care, had long discussion with patient's daughters.  DNR.   Anxiety/depression/dementia  - Namenda  10 mg PO bid - Remeron  7.5 mg PO at bedtime  - Valproate 250 mg q12     Resume once taking oral.   Dysphagia  - On pured diet at baseline, speech therapy following, had to be made n.p.o. again on 10/11/2023, will request speech to follow.   HTN  - Monitor, as needed IV hydralazine   HLD  - Lipitor 40 mg PO  at bedtime once taking p.o.   Hypothyroidism  - Synthroid  137 mcg PO daily once taking p.o.   HyperNa+ - Severe dehydration D5W   Diabetes  - Novolog  SS q4 hr    Discharge diagnosis     Principal Problem:   Sepsis (HCC) Active Problems:   Dementia with behavioral disturbance (HCC)   Essential hypertension   Mood disorder with psychosis (HCC)   Secondary DM with CKD stage 3 and hypertension (HCC)   Stage 3a chronic kidney disease (CKD) (HCC)   Bedbound   Hypothyroidism   Hyperlipidemia   MDD (major depressive disorder)   Anxiety   Normochromic normocytic anemia    Discharge instructions    Discharge Instructions     Discharge instructions   Complete by: As directed    Disposition.  Residential hospice Condition.  Guarded CODE STATUS.  DNR Activity.  With assistance as tolerated, full fall precautions. Diet.  Soft with feeding assistance and aspiration precautions. Goal of care.  Comfort.   No wound care    Complete by: As directed        Discharge Medications   Allergies as of 10/13/2023   No Known Allergies      Medication List     STOP taking these medications    ARTIFICIAL TEAR SOLUTION OP   aspirin  EC 81 MG tablet   atorvastatin  40 MG tablet Commonly known as: LIPITOR   Calcium -Vitamin D3 600-400 MG-UNIT Tabs   carbamide peroxide 6.5 % OTIC solution Commonly known as: DEBROX   cholecalciferol 25 MCG (1000 UNIT) tablet Commonly known as: VITAMIN D3   Ensure   ENSURE ENLIVE PO   furosemide 20 MG tablet Commonly known as: LASIX   Ingrezza  80 MG capsule Generic drug: valbenazine    levothyroxine  137 MCG tablet Commonly known as: SYNTHROID    memantine  10 MG tablet Commonly known as: NAMENDA    Minerin Creme Crea   mirtazapine  7.5 MG tablet Commonly known as: REMERON    MULTI-VITAMIN DAILY PO   NON FORMULARY   NUTRITIONAL SUPPLEMENT PO   oxyCODONE HCl 10 MG Taba   RA SALINE ENEMA RE   sennosides-docusate sodium 8.6-50 MG tablet Commonly known as: SENOKOT-S       TAKE these medications    acetaminophen  325 MG tablet Commonly known as: TYLENOL  Take 650 mg by mouth every 6 (six) hours as needed for mild pain (pain score 1-3).   albuterol  108 (90 Base) MCG/ACT inhaler Commonly known as: VENTOLIN  HFA Inhale 2 puffs into the lungs every 6 (six) hours as needed for wheezing or shortness of breath.   divalproex  125 MG capsule Commonly known as: DEPAKOTE  SPRINKLE Take 250 mg by mouth 2 (two) times daily.   QUEtiapine  50 MG tablet Commonly known as: SEROQUEL  Take 50 mg by mouth 2 (two) times daily.          Major procedures and Radiology Reports - PLEASE review detailed and final reports thoroughly  -        DG Chest Port 1 View Result Date: 10/13/2023 CLINICAL DATA:  Shortness of breath EXAM: PORTABLE CHEST 1 VIEW COMPARISON:  10/12/2023 FINDINGS: Stable cardiomediastinal contours. Aortic atherosclerosis. Asymmetric elevation of right  hemidiaphragm. Multifocal airspace opacities within the left lung are unchanged when compared with the previous exam. Right lung is clear. Visualized osseous structures are unremarkable. IMPRESSION: Unchanged multifocal airspace opacities within the left lung compatible with multifocal pneumonia. Electronically Signed   By: Kimberley Penman M.D.   On: 10/13/2023 07:28   DG  Chest Port 1 View Result Date: 10/12/2023 CLINICAL DATA:  Shortness of breath. EXAM: PORTABLE CHEST 1 VIEW COMPARISON:  10/10/2023 FINDINGS: Stable cardiomediastinal contours. Aortic atherosclerosis. There is been interval worsening aeration to the left lower lung compared with the previous exam. This may reflect progressive atelectasis or consolidation and or underlying new pleural effusion. Right lung appears clear. IMPRESSION: Interval worsening aeration to the left lower lung compared with the previous exam. This may reflect progressive atelectasis/consolidation and/or underlying new pleural effusion. Electronically Signed   By: Kimberley Penman M.D.   On: 10/12/2023 06:59   DG Chest Port 1 View Result Date: 10/10/2023 CLINICAL DATA:  Sepsis EXAM: PORTABLE CHEST 1 VIEW COMPARISON:  May 29, 2023 FINDINGS: Ill-defined lingular and left lower lobe infiltrates without consolidations Right lung clear Heart and mediastinum normal No pleural effusions IMPRESSION: Ill-defined lingular and left lower lobe infiltrates without consolidations. Electronically Signed   By: Fredrich Jefferson M.D.   On: 10/10/2023 15:58    Micro Results    Recent Results (from the past 240 hours)  Resp panel by RT-PCR (RSV, Flu A&B, Covid) Anterior Nasal Swab     Status: None   Collection Time: 10/10/23  3:14 PM   Specimen: Anterior Nasal Swab  Result Value Ref Range Status   SARS Coronavirus 2 by RT PCR NEGATIVE NEGATIVE Final   Influenza A by PCR NEGATIVE NEGATIVE Final   Influenza B by PCR NEGATIVE NEGATIVE Final    Comment: (NOTE) The Xpert Xpress  SARS-CoV-2/FLU/RSV plus assay is intended as an aid in the diagnosis of influenza from Nasopharyngeal swab specimens and should not be used as a sole basis for treatment. Nasal washings and aspirates are unacceptable for Xpert Xpress SARS-CoV-2/FLU/RSV testing.  Fact Sheet for Patients: BloggerCourse.com  Fact Sheet for Healthcare Providers: SeriousBroker.it  This test is not yet approved or cleared by the [place]  FDA and has been authorized for detection and/or diagnosis of SARS-CoV-2 by FDA under an Emergency Use Authorization (EUA). This EUA will remain in effect (meaning this test can be used) for the duration of the COVID-19 declaration under Section 564(b)(1) of the Act, 21 U.S.C. section 360bbb-3(b)(1), unless the authorization is terminated or revoked.     Resp Syncytial Virus by PCR NEGATIVE NEGATIVE Final    Comment: (NOTE) Fact Sheet for Patients: BloggerCourse.com  Fact Sheet for Healthcare Providers: SeriousBroker.it  This test is not yet approved or cleared by the [place]  FDA and has been authorized for detection and/or diagnosis of SARS-CoV-2 by FDA under an Emergency Use Authorization (EUA). This EUA will remain in effect (meaning this test can be used) for the duration of the COVID-19 declaration under Section 564(b)(1) of the Act, 21 U.S.C. section 360bbb-3(b)(1), unless the authorization is terminated or revoked.  Performed at [facility], [address] [place], [place] [address]   Blood Culture (routine x 2)     Status: Abnormal   Collection Time: 10/10/23  3:18 PM   Specimen: BLOOD RIGHT ARM  Result Value Ref Range Status   Specimen Description BLOOD RIGHT ARM  Final   Special Requests   Final    BOTTLES DRAWN AEROBIC AND ANAEROBIC Blood Culture results may not be optimal due to an inadequate volume of blood received in culture  bottles   Culture  Setup Time   Final    GRAM POSITIVE COCCI IN CLUSTERS IN BOTH AEROBIC AND ANAEROBIC BOTTLES CRITICAL RESULT CALLED TO, READ BACK BY AND VERIFIED WITH: PHARMD AMANDA KAN  [id number] AT 1104 BY EC Performed at [facility], [address] [place], [place] [address]    Culture STAPHYLOCOCCUS SPECIES (COAGULASE NEGATIVE) (A)  Final   Report Status 10/13/2023 FINAL  Final   Organism ID, Bacteria STAPHYLOCOCCUS SPECIES (COAGULASE NEGATIVE)  Final      Susceptibility   Staphylococcus species (coagulase negative) - MIC*    CIPROFLOXACIN <=0.5 SENSITIVE Sensitive     ERYTHROMYCIN <=0.25 SENSITIVE Sensitive     GENTAMICIN <=0.5 SENSITIVE Sensitive     OXACILLIN <=0.25 SENSITIVE Sensitive     TETRACYCLINE <=1 SENSITIVE Sensitive     VANCOMYCIN  1 SENSITIVE Sensitive     TRIMETH/SULFA <=10 SENSITIVE Sensitive     CLINDAMYCIN >=8 RESISTANT Resistant     RIFAMPIN <=0.5 SENSITIVE Sensitive     Inducible Clindamycin NEGATIVE Sensitive     * STAPHYLOCOCCUS SPECIES (COAGULASE NEGATIVE)  Blood Culture (routine x 2)     Status: None (Preliminary result)   Collection Time: 10/10/23  3:18 PM   Specimen: BLOOD RIGHT ARM  Result Value Ref Range Status   Specimen Description BLOOD RIGHT ARM  Final   Special Requests   Final    BOTTLES DRAWN AEROBIC AND ANAEROBIC Blood Culture adequate volume   Culture  Setup Time   Final    GRAM POSITIVE COCCI AEROBIC BOTTLE ONLY CRITICAL VALUE NOTED.  VALUE IS CONSISTENT WITH PREVIOUSLY REPORTED AND CALLED VALUE.    Culture   Final    GRAM POSITIVE COCCI IDENTIFICATION TO FOLLOW Performed at [facility], [address], [place], [place] [address]    Report Status PENDING  Incomplete  Blood Culture ID Panel (Reflexed)     Status: Abnormal   Collection Time: 10/10/23  3:18 PM  Result Value Ref Range Status   Enterococcus faecalis NOT DETECTED NOT DETECTED Final   Enterococcus Faecium NOT DETECTED NOT DETECTED Final   Listeria  monocytogenes NOT DETECTED NOT DETECTED Final   Staphylococcus species DETECTED (A) NOT DETECTED Final    Comment: CRITICAL RESULT CALLED TO, READ BACK BY AND VERIFIED WITH: PHARMD AMANDA KAN [id number] AT 1104 BY EC    Staphylococcus aureus (BCID) NOT DETECTED NOT DETECTED Final   Staphylococcus epidermidis DETECTED (A) NOT DETECTED Final    Comment: CRITICAL RESULT CALLED TO, READ BACK BY AND VERIFIED WITH: PHARMD AMANDA KAN [id number] AT 1104 BY EC    Staphylococcus lugdunensis NOT DETECTED NOT DETECTED Final   Streptococcus species NOT DETECTED NOT DETECTED Final   Streptococcus agalactiae NOT DETECTED NOT DETECTED Final   Streptococcus pneumoniae NOT DETECTED NOT DETECTED Final   Streptococcus pyogenes NOT DETECTED NOT DETECTED Final   A.calcoaceticus-baumannii NOT DETECTED NOT DETECTED Final   Bacteroides fragilis NOT DETECTED NOT DETECTED Final   Enterobacterales NOT DETECTED NOT DETECTED Final   Enterobacter cloacae complex NOT DETECTED NOT DETECTED Final   Escherichia coli NOT DETECTED NOT DETECTED Final   Klebsiella aerogenes NOT DETECTED NOT DETECTED Final   Klebsiella oxytoca NOT DETECTED NOT DETECTED Final   Klebsiella pneumoniae NOT DETECTED NOT DETECTED Final   Proteus species NOT DETECTED NOT DETECTED Final   Salmonella species NOT DETECTED NOT DETECTED Final   Serratia marcescens NOT DETECTED NOT DETECTED Final   Haemophilus influenzae NOT DETECTED NOT DETECTED Final   Neisseria meningitidis NOT DETECTED NOT DETECTED Final   Pseudomonas aeruginosa NOT DETECTED NOT DETECTED Final   Stenotrophomonas maltophilia NOT DETECTED NOT DETECTED Final   Candida albicans NOT DETECTED NOT DETECTED Final   Candida auris  NOT DETECTED NOT DETECTED Final   Candida glabrata NOT DETECTED NOT DETECTED Final   Candida krusei NOT DETECTED NOT DETECTED Final   Candida parapsilosis NOT DETECTED NOT DETECTED Final   Candida tropicalis NOT DETECTED NOT DETECTED Final   Cryptococcus  neoformans/gattii NOT DETECTED NOT DETECTED Final   Methicillin resistance mecA/C NOT DETECTED NOT DETECTED Final    Comment: Performed at [facility], [address], [place], [place] [address]  Respiratory (~20 pathogens) panel by PCR     Status: None   Collection Time: 10/10/23  7:00 PM   Specimen: Nasopharyngeal Swab; Respiratory  Result Value Ref Range Status   Adenovirus NOT DETECTED NOT DETECTED Final   Coronavirus 229E NOT DETECTED NOT DETECTED Final    Comment: (NOTE) The Coronavirus on the Respiratory Panel, DOES NOT test for the novel  Coronavirus (2019 nCoV)    Coronavirus HKU1 NOT DETECTED NOT DETECTED Final   Coronavirus NL63 NOT DETECTED NOT DETECTED Final   Coronavirus OC43 NOT DETECTED NOT DETECTED Final   Metapneumovirus NOT DETECTED NOT DETECTED Final   Rhinovirus / Enterovirus NOT DETECTED NOT DETECTED Final   Influenza A NOT DETECTED NOT DETECTED Final   Influenza B NOT DETECTED NOT DETECTED Final   Parainfluenza Virus 1 NOT DETECTED NOT DETECTED Final   Parainfluenza Virus 2 NOT DETECTED NOT DETECTED Final   Parainfluenza Virus 3 NOT DETECTED NOT DETECTED Final   Parainfluenza Virus 4 NOT DETECTED NOT DETECTED Final   Respiratory Syncytial Virus NOT DETECTED NOT DETECTED Final   Bordetella pertussis NOT DETECTED NOT DETECTED Final   Bordetella Parapertussis NOT DETECTED NOT DETECTED Final   Chlamydophila pneumoniae NOT DETECTED NOT DETECTED Final   Mycoplasma pneumoniae NOT DETECTED NOT DETECTED Final    Comment: Performed at [facility], [address], [place], [place] [address]  MRSA Next Gen by PCR, Nasal     Status: None   Collection Time: 10/11/23  1:29 PM   Specimen: Nasal Mucosa; Nasal Swab  Result Value Ref Range Status   MRSA by PCR Next Gen NOT DETECTED NOT DETECTED Final    Comment: (NOTE) The GeneXpert MRSA Assay (FDA approved for NASAL specimens only), is one component of a comprehensive MRSA colonization surveillance program. It  is not intended to diagnose MRSA infection nor to guide or monitor treatment for MRSA infections. Test performance is not FDA approved in patients less than 81 years old. Performed at [facility], [address], [place], [place] [address]   Culture, blood (Routine X 2) w Reflex to ID Panel     Status: None (Preliminary result)   Collection Time: 10/12/23  4:20 AM   Specimen: BLOOD LEFT ARM  Result Value Ref Range Status   Specimen Description BLOOD LEFT ARM  Final   Special Requests   Final    BOTTLES DRAWN AEROBIC AND ANAEROBIC Blood Culture adequate volume   Culture   Final    NO GROWTH 1 DAY Performed at [facility], [address], [place], [place] [address]    Report Status PENDING  Incomplete  Culture, blood (Routine X 2) w Reflex to ID Panel     Status: None (Preliminary result)   Collection Time: 10/12/23  4:27 AM   Specimen: BLOOD LEFT ARM  Result Value Ref Range Status   Specimen Description BLOOD LEFT ARM  Final   Special Requests   Final    BOTTLES DRAWN AEROBIC AND ANAEROBIC Blood Culture adequate volume  Culture   Final    NO GROWTH 1 DAY Performed at [facility], [address], [place], [place] [address]    Report Status PENDING  Incomplete    Today   Subjective    Kelly [name] today in bed, in no distress, no ROS - due to dementia.   Objective   Blood pressure (!) 92/55, pulse 82, temperature 98.3 F (36.8 C), temperature source Oral, resp. rate 18, height 5\' 9"  (1.753 m), weight 61.1 kg, SpO2 91%.   Intake/Output Summary (Last 24 hours) at 10/13/2023 1312 Last data filed at 10/13/2023 0800 Gross per 24 hour  Intake 2073.28 ml  Output 700 ml  Net 1373.28 ml    Exam  Awake but confused, No new F.N deficits,  frail and elderly [place].AT,PERRAL Supple Neck,   Symmetrical Chest wall movement, Good air movement bilaterally, CTAB RRR,No Gallops,   +ve B.Sounds, Abd Soft, Non tender,  No Cyanosis, Clubbing or edema    Data  Review   Recent Labs  Lab 10/10/23 1514 10/11/23 0320 10/12/23 0420 10/13/23 0640  WBC 5.7 12.4* 16.3* 15.6*  HGB 20.3* 10.3* 11.1* 10.6*  HCT 62.6* 31.7* 34.1* 31.8*  PLT 188 196 219 206  MCV 93.4 93.8 91.9 90.9  MCH 30.3 30.5 29.9 30.3  MCHC 32.4 32.5 32.6 33.3  RDW 16.3* 14.6 14.6 14.5  LYMPHSABS 1.1  --   --   --   MONOABS 0.6  --   --   --   EOSABS 0.0  --   --   --   BASOSABS 0.0  --   --   --     Recent Labs  Lab 10/10/23 1514 10/10/23 1537 10/10/23 1850 10/10/23 1900 10/10/23 2030 10/11/23 0320 10/11/23 0332 10/12/23 0418 10/12/23 0420 10/12/23 0427 10/13/23 0640  NA 150*  --   --   --   --  150*  --   --  153*  --  146*  K 4.2  --   --   --   --  3.8  --   --  3.7  --  3.1*  CL 112*  --   --   --   --  115*  --   --  116*  --  112*  CO2 26  --   --   --   --  27  --   --  28  --  24  ANIONGAP 12  --   --   --   --  8  --   --  9  --  10  GLUCOSE 132*  --   --   --   --  115*  --   --  93  --  92  BUN 27*  --   --   --   --  20  --   --  20  --  14  CREATININE 1.17*  --   --   --   --  0.79  --   --  0.78  --  0.68  AST 63*  --   --   --   --  35  --   --  41  --  33  ALT 43  --   --   --   --  28  --   --  27  --  22  ALKPHOS 56  --   --   --   --  37*  --   --  46  --  44  BILITOT 0.5  --   --   --   --  0.7  --   --  0.5  --  0.5  ALBUMIN 2.4*  --   --   --   --  1.6*  --   --  1.8*  --  1.7*  CRP  --   --   --   --   --   --   --   --  26.5*  --  17.4*  PROCALCITON  --   --   --  0.95  --  1.69  --  1.33  --   --  0.65  LATICACIDVEN  --  3.4* 7.7*  --  5.3*  --  2.0*  --   --  1.4  --   INR 1.3*  --   --   --   --  1.7*  --   --   --   --   --   HGBA1C  --   --   --  6.0*  --   --   --   --   --   --   --   BNP  --   --   --   --   --   --   --   --   --   --  508.5*  MG  --   --   --   --   --   --   --   --  2.2  --  1.9  PHOS  --   --   --   --   --   --   --   --  3.7  --  2.5  CALCIUM  9.3  --   --   --   --  8.7*  --   --  9.1  --  8.4*     Total Time in preparing paper work, data evaluation and todays exam - 35 minutes  Signature  -    Lynnwood Sauer M.D on 10/13/2023 at 1:12 PM   -  To page go to [email or web address]

## 2023-10-13 NOTE — Progress Notes (Signed)
 PROGRESS NOTE                                                                                                                                                                                                             Patient Demographics:    Kelly [name], is a 89 y.o. female, DOB - [birth year], UXL:[id number]  Outpatient Primary MD for the patient is Pcp, No    LOS - 3  Admit date - 10/10/2023    Chief Complaint  Patient presents with   Code Sepsis       Brief Narrative (HPI from H&P)   89 y.o. female with medical history significant of dementia, bedbound status, hypertension, hyperlipidemia, hypothyroidism, CKD 3A, anemia, diabetes, anxiety, depression, mood disorder with psychosis presenting with tachycardia and fever.   Routine with assistance of chart review and family.  EMS was initially called out to evaluate patient for tachycardia.  She is found to be tachycardic, tachypneic and febrile.  Family reports that patient has had a cough for several days and has had increased cough with feeding.  She is on pured diet.  Was admitted with sepsis likely due to aspiration pneumonia.   Subjective:    Kelly [name] today in bed, will not answer questions or follow commands due to underlying advanced dementia.   Assessment  & Plan :    Sepsis due to Ackley aspiration pneumonia.  History of advanced dementia, dysphagia, on pured diet. - Patient has longstanding history of advanced dementia and gradually declining, now has evidence of sepsis due to aspiration pneumonia, MRSA nasal PCR negative, blood culture suggestive of contamination, follow final cultures so far 1 set out of 2 growing gram-positive cocci in clusters, speech therapy to follow, gentle IV fluids and IV antibiotics, currently on vancomycin  and Unasyn.  Long-term prognosis is poor.  Await input by palliative care, had long discussion with patient's daughters.  DNR.    Anxiety/depression/dementia  - Namenda  10 mg PO bid - Remeron  7.5 mg PO at bedtime  - Valproate 250 mg q12    Resume once taking oral.   Dysphagia  - On pured diet at baseline, speech therapy following, had to be made n.p.o. again on 10/11/2023, will request speech to follow.   HTN  - Monitor, as needed IV hydralazine   HLD  - Lipitor 40 mg PO  at bedtime once taking p.o.   Hypothyroidism  - Synthroid  137 mcg PO daily once taking p.o.   HyperNa+ - Severe dehydration D5W   Diabetes  - Novolog  SS q4 hr   Lab Results  Component Value Date   HGBA1C 6.0 (H) 10/10/2023   CBG (last 3)  Recent Labs    10/12/23 2341 10/13/23 0300 10/13/23 0742  GLUCAP 109* 121* 85         Condition - Extremely Guarded  Family Communication  :    Daughter Adah Acron [phone]  - on 10/12/23  Code Status :  DNR  Consults  :  Pall care  PUD Prophylaxis :     Procedures  :            Disposition Plan  :    Status is: Inpatient   DVT Prophylaxis  :    enoxaparin  (LOVENOX ) injection 40 mg Start: 10/10/23 2000    Lab Results  Component Value Date   PLT 206 10/13/2023    Diet :  Diet Order             Diet clear liquid Room service appropriate? No; Fluid consistency: Thin  Diet effective now                    Inpatient Medications  Scheduled Meds:  atorvastatin   40 mg Oral QHS   enoxaparin  (LOVENOX ) injection  40 mg Subcutaneous Q24H   feeding supplement  237 mL Oral BID BM   insulin  aspart  0-9 Units Subcutaneous Q4H   leptospermum manuka honey  1 Application Topical Daily   levothyroxine   137 mcg Oral QAC breakfast   memantine   10 mg Oral BID   mirtazapine   7.5 mg Oral QHS   sodium chloride  flush  3 mL Intravenous Q12H   Continuous Infusions:  ampicillin-sulbactam (UNASYN) IV 3 g (10/13/23 0528)   valproate sodium  250 mg (10/12/23 2114)   PRN Meds:.acetaminophen  **OR** acetaminophen , albuterol , polyethylene glycol  Antibiotics  :     Anti-infectives (From admission, onward)    Start     Dose/Rate Route Frequency Ordered Stop   10/12/23 1200  Ampicillin-Sulbactam (UNASYN) 3 g in sodium chloride  0.9 % 100 mL IVPB        3 g 200 mL/hr over 30 Minutes Intravenous Every 6 hours 10/12/23 0846 10/17/23 1159   10/11/23 1800  vancomycin  (VANCOREADY) IVPB 750 mg/150 mL  Status:  Discontinued        750 mg 150 mL/hr over 60 Minutes Intravenous Every 24 hours 10/10/23 1938 10/12/23 0846   10/11/23 0400  ceFEPIme  (MAXIPIME ) 2 g in sodium chloride  0.9 % 100 mL IVPB  Status:  Discontinued        2 g 200 mL/hr over 30 Minutes Intravenous Every 12 hours 10/10/23 1932 10/12/23 0846   10/10/23 2200  metroNIDAZOLE  (FLAGYL ) IVPB 500 mg  Status:  Discontinued        500 mg 100 mL/hr over 60 Minutes Intravenous Every 12 hours 10/10/23 1859 10/12/23 0846   10/10/23 1615  Vancomycin  (VANCOCIN ) 1,250 mg in sodium chloride  0.9 % 250 mL IVPB        1,250 mg 166.7 mL/hr over 90 Minutes Intravenous  Once 10/10/23 1606 10/10/23 1756   10/10/23 1530  vancomycin  (VANCOREADY) IVPB 1250 mg/250 mL  Status:  Discontinued        1,250 mg 166.7 mL/hr over 90 Minutes Intravenous  Once 10/10/23 1520 10/10/23 1606   10/10/23 1515  ceFEPIme  (MAXIPIME ) 2 g in sodium chloride  0.9 % 100 mL IVPB        2 g 200 mL/hr over 30 Minutes Intravenous  Once 10/10/23 1514 10/10/23 1609   10/10/23 1515  metroNIDAZOLE  (FLAGYL ) IVPB 500 mg        500 mg 100 mL/hr over 60 Minutes Intravenous  Once 10/10/23 1514 10/10/23 1704   10/10/23 1515  vancomycin  (VANCOCIN ) IVPB 1000 mg/200 mL premix  Status:  Discontinued        1,000 mg 200 mL/hr over 60 Minutes Intravenous  Once 10/10/23 1514 10/10/23 1520         Objective:   Vitals:   10/12/23 2306 10/13/23 0300 10/13/23 0744 10/13/23 0800  BP: 124/67 (!) 111/55 107/62 112/61  Pulse: 66 68 78 76  Resp: (!) 23 16 16 17   Temp: 98.3 F (36.8 C) 98.4 F (36.9 C) 98.1 F (36.7 C)   TempSrc: Axillary Oral Oral    SpO2: 95% 94% 97% 98%  Weight:      Height:        Wt Readings from Last 3 Encounters:  10/12/23 61.1 kg  11/17/22 80 kg  05/08/22 82.4 kg     Intake/Output Summary (Last 24 hours) at 10/13/2023 3235 Last data filed at 10/13/2023 0800 Gross per 24 hour  Intake 2073.28 ml  Output 700 ml  Net 1373.28 ml     Physical Exam  Awake but minimally responsive, No new F.N deficits,   [place].AT,PERRAL Supple Neck, No JVD,   Symmetrical Chest wall movement, Good air movement bilaterally, CTAB RRR,No Gallops,Rubs or new Murmurs,  +ve B.Sounds, Abd Soft, No tenderness,   Chronic lower extremity contractures, chronic heel pressure ulcers present on admission with bandage on top of it    RN pressure injury documentation: Pressure Injury 10/11/23 Hip Anterior;Right Stage 4 - Full thickness tissue loss with exposed bone, tendon or muscle. (Active)  10/11/23 1205  Location: Hip  Location Orientation: Anterior;Right  Staging: Stage 4 - Full thickness tissue loss with exposed bone, tendon or muscle.  Wound Description (Comments):   Present on Admission: Yes  Dressing Type Foam - Lift dressing to assess site every shift 10/12/23 1600     Pressure Injury 10/11/23 Buttocks Right Stage 1 -  Intact skin with non-blanchable redness of a localized area usually over a bony prominence. (Active)  10/11/23 1205  Location: Buttocks  Location Orientation: Right  Staging: Stage 1 -  Intact skin with non-blanchable redness of a localized area usually over a bony prominence.  Wound Description (Comments):   Present on Admission: Yes  Dressing Type Foam - Lift dressing to assess site every shift 10/12/23 1600      Data Review:    Recent Labs  Lab 10/10/23 1514 10/11/23 0320 10/12/23 0420 10/13/23 0640  WBC 5.7 12.4* 16.3* 15.6*  HGB 20.3* 10.3* 11.1* 10.6*  HCT 62.6* 31.7* 34.1* 31.8*  PLT 188 196 219 206  MCV 93.4 93.8 91.9 90.9  MCH 30.3 30.5 29.9 30.3  MCHC 32.4 32.5 32.6 33.3  RDW 16.3*  14.6 14.6 14.5  LYMPHSABS 1.1  --   --   --   MONOABS 0.6  --   --   --   EOSABS 0.0  --   --   --   BASOSABS 0.0  --   --   --     Recent Labs  Lab 10/10/23 1514 10/10/23 1537 10/10/23 1850 10/10/23 1900 10/10/23 2030 10/11/23 0320 10/11/23 0332 10/12/23  0981 10/12/23 0420 10/12/23 0427 10/13/23 0640  NA 150*  --   --   --   --  150*  --   --  153*  --  146*  K 4.2  --   --   --   --  3.8  --   --  3.7  --  3.1*  CL 112*  --   --   --   --  115*  --   --  116*  --  112*  CO2 26  --   --   --   --  27  --   --  28  --  24  ANIONGAP 12  --   --   --   --  8  --   --  9  --  10  GLUCOSE 132*  --   --   --   --  115*  --   --  93  --  92  BUN 27*  --   --   --   --  20  --   --  20  --  14  CREATININE 1.17*  --   --   --   --  0.79  --   --  0.78  --  0.68  AST 63*  --   --   --   --  35  --   --  41  --  33  ALT 43  --   --   --   --  28  --   --  27  --  22  ALKPHOS 56  --   --   --   --  37*  --   --  46  --  44  BILITOT 0.5  --   --   --   --  0.7  --   --  0.5  --  0.5  ALBUMIN 2.4*  --   --   --   --  1.6*  --   --  1.8*  --  1.7*  CRP  --   --   --   --   --   --   --   --  26.5*  --  17.4*  PROCALCITON  --   --   --  0.95  --  1.69  --  1.33  --   --   --   LATICACIDVEN  --  3.4* 7.7*  --  5.3*  --  2.0*  --   --  1.4  --   INR 1.3*  --   --   --   --  1.7*  --   --   --   --   --   HGBA1C  --   --   --  6.0*  --   --   --   --   --   --   --   BNP  --   --   --   --   --   --   --   --   --   --  508.5*  MG  --   --   --   --   --   --   --   --  2.2  --  1.9  PHOS  --   --   --   --   --   --   --   --  3.7  --  2.5  CALCIUM  9.3  --   --   --   --  8.7*  --   --  9.1  --  8.4*     Recent Labs  Lab 10/10/23 1514 10/10/23 1537 10/10/23 1850 10/10/23 1900 10/10/23 2030 10/11/23 0320 10/11/23 0332 10/12/23 0418 10/12/23 0420 10/12/23 0427 10/13/23 0640  CRP  --   --   --   --   --   --   --   --  26.5*  --  17.4*  PROCALCITON  --   --   --  0.95  --  1.69   --  1.33  --   --   --   LATICACIDVEN  --  3.4* 7.7*  --  5.3*  --  2.0*  --   --  1.4  --   INR 1.3*  --   --   --   --  1.7*  --   --   --   --   --   HGBA1C  --   --   --  6.0*  --   --   --   --   --   --   --   BNP  --   --   --   --   --   --   --   --   --   --  508.5*  MG  --   --   --   --   --   --   --   --  2.2  --  1.9  CALCIUM  9.3  --   --   --   --  8.7*  --   --  9.1  --  8.4*   Lab Results  Component Value Date   CHOL 182 06/27/2021   HDL 62 06/27/2021   LDLCALC 89 06/27/2021   TRIG 156 06/27/2021    Lab Results  Component Value Date   HGBA1C 6.0 (H) 10/10/2023     Micro Results Recent Results (from the past 240 hours)  Resp panel by RT-PCR (RSV, Flu A&B, Covid) Anterior Nasal Swab     Status: None   Collection Time: 10/10/23  3:14 PM   Specimen: Anterior Nasal Swab  Result Value Ref Range Status   SARS Coronavirus 2 by RT PCR NEGATIVE NEGATIVE Final   Influenza A by PCR NEGATIVE NEGATIVE Final   Influenza B by PCR NEGATIVE NEGATIVE Final    Comment: (NOTE) The Xpert Xpress SARS-CoV-2/FLU/RSV plus assay is intended as an aid in the diagnosis of influenza from Nasopharyngeal swab specimens and should not be used as a sole basis for treatment. Nasal washings and aspirates are unacceptable for Xpert Xpress SARS-CoV-2/FLU/RSV testing.  Fact Sheet for Patients: BloggerCourse.com  Fact Sheet for Healthcare Providers: SeriousBroker.it  This test is not yet approved or cleared by the [place]  FDA and has been authorized for detection and/or diagnosis of SARS-CoV-2 by FDA under an Emergency Use Authorization (EUA). This EUA will remain in effect (meaning this test can be used) for the duration of the COVID-19 declaration under Section 564(b)(1) of the Act, 21 U.S.C. section 360bbb-3(b)(1), unless the authorization is terminated or revoked.     Resp Syncytial Virus by PCR NEGATIVE NEGATIVE Final     Comment: (NOTE) Fact Sheet for Patients: BloggerCourse.com  Fact Sheet for Healthcare Providers: SeriousBroker.it  This test is not yet approved or cleared by the [place]  FDA and has been authorized for detection  and/or diagnosis of SARS-CoV-2 by FDA under an Emergency Use Authorization (EUA). This EUA will remain in effect (meaning this test can be used) for the duration of the COVID-19 declaration under Section 564(b)(1) of the Act, 21 U.S.C. section 360bbb-3(b)(1), unless the authorization is terminated or revoked.  Performed at [facility], [address], [place], [place] [address]   Blood Culture (routine x 2)     Status: None (Preliminary result)   Collection Time: 10/10/23  3:18 PM   Specimen: BLOOD RIGHT ARM  Result Value Ref Range Status   Specimen Description BLOOD RIGHT ARM  Final   Special Requests   Final    BOTTLES DRAWN AEROBIC AND ANAEROBIC Blood Culture results may not be optimal due to an inadequate volume of blood received in culture bottles   Culture  Setup Time   Final    GRAM POSITIVE COCCI IN CLUSTERS IN BOTH AEROBIC AND ANAEROBIC BOTTLES CRITICAL RESULT CALLED TO, READ BACK BY AND VERIFIED WITH: PHARMD AMANDA KAN [id number] AT 1104 BY EC    Culture   Final    GRAM POSITIVE COCCI IDENTIFICATION TO FOLLOW Performed at [facility], [address], [place], [place] [address]    Report Status PENDING  Incomplete  Blood Culture (routine x 2)     Status: None (Preliminary result)   Collection Time: 10/10/23  3:18 PM   Specimen: BLOOD RIGHT ARM  Result Value Ref Range Status   Specimen Description BLOOD RIGHT ARM  Final   Special Requests   Final    BOTTLES DRAWN AEROBIC AND ANAEROBIC Blood Culture adequate volume   Culture  Setup Time   Final    GRAM POSITIVE COCCI AEROBIC BOTTLE ONLY CRITICAL VALUE NOTED.  VALUE IS CONSISTENT WITH PREVIOUSLY REPORTED AND CALLED VALUE. Performed at  [facility], [address], [place], [place] [address]    Culture GRAM POSITIVE COCCI  Final   Report Status PENDING  Incomplete  Blood Culture ID Panel (Reflexed)     Status: Abnormal   Collection Time: 10/10/23  3:18 PM  Result Value Ref Range Status   Enterococcus faecalis NOT DETECTED NOT DETECTED Final   Enterococcus Faecium NOT DETECTED NOT DETECTED Final   Listeria monocytogenes NOT DETECTED NOT DETECTED Final   Staphylococcus species DETECTED (A) NOT DETECTED Final    Comment: CRITICAL RESULT CALLED TO, READ BACK BY AND VERIFIED WITH: PHARMD AMANDA KAN [phone] AT 1104 BY EC    Staphylococcus aureus (BCID) NOT DETECTED NOT DETECTED Final   Staphylococcus epidermidis DETECTED (A) NOT DETECTED Final    Comment: CRITICAL RESULT CALLED TO, READ BACK BY AND VERIFIED WITH: PHARMD AMANDA KAN [phone] AT 1104 BY EC    Staphylococcus lugdunensis NOT DETECTED NOT DETECTED Final   Streptococcus species NOT DETECTED NOT DETECTED Final   Streptococcus agalactiae NOT DETECTED NOT DETECTED Final   Streptococcus pneumoniae NOT DETECTED NOT DETECTED Final   Streptococcus pyogenes NOT DETECTED NOT DETECTED Final   A.calcoaceticus-baumannii NOT DETECTED NOT DETECTED Final   Bacteroides fragilis NOT DETECTED NOT DETECTED Final   Enterobacterales NOT DETECTED NOT DETECTED Final   Enterobacter cloacae complex NOT DETECTED NOT DETECTED Final   Escherichia coli NOT DETECTED NOT DETECTED Final   Klebsiella aerogenes NOT DETECTED NOT DETECTED Final   Klebsiella oxytoca NOT DETECTED NOT DETECTED Final   Klebsiella pneumoniae NOT DETECTED NOT DETECTED Final   Proteus species NOT DETECTED NOT DETECTED Final   Salmonella species NOT DETECTED NOT DETECTED  Final   Serratia marcescens NOT DETECTED NOT DETECTED Final   Haemophilus influenzae NOT DETECTED NOT DETECTED Final   Neisseria meningitidis NOT DETECTED NOT DETECTED Final   Pseudomonas aeruginosa NOT DETECTED NOT DETECTED Final    Stenotrophomonas maltophilia NOT DETECTED NOT DETECTED Final   Candida albicans NOT DETECTED NOT DETECTED Final   Candida auris NOT DETECTED NOT DETECTED Final   Candida glabrata NOT DETECTED NOT DETECTED Final   Candida krusei NOT DETECTED NOT DETECTED Final   Candida parapsilosis NOT DETECTED NOT DETECTED Final   Candida tropicalis NOT DETECTED NOT DETECTED Final   Cryptococcus neoformans/gattii NOT DETECTED NOT DETECTED Final   Methicillin resistance mecA/C NOT DETECTED NOT DETECTED Final    Comment: Performed at [facility], [address] [place], [place] [address]  Respiratory (~20 pathogens) panel by PCR     Status: None   Collection Time: 10/10/23  7:00 PM   Specimen: Nasopharyngeal Swab; Respiratory  Result Value Ref Range Status   Adenovirus NOT DETECTED NOT DETECTED Final   Coronavirus 229E NOT DETECTED NOT DETECTED Final    Comment: (NOTE) The Coronavirus on the Respiratory Panel, DOES NOT test for the novel  Coronavirus (2019 nCoV)    Coronavirus HKU1 NOT DETECTED NOT DETECTED Final   Coronavirus NL63 NOT DETECTED NOT DETECTED Final   Coronavirus OC43 NOT DETECTED NOT DETECTED Final   Metapneumovirus NOT DETECTED NOT DETECTED Final   Rhinovirus / Enterovirus NOT DETECTED NOT DETECTED Final   Influenza A NOT DETECTED NOT DETECTED Final   Influenza B NOT DETECTED NOT DETECTED Final   Parainfluenza Virus 1 NOT DETECTED NOT DETECTED Final   Parainfluenza Virus 2 NOT DETECTED NOT DETECTED Final   Parainfluenza Virus 3 NOT DETECTED NOT DETECTED Final   Parainfluenza Virus 4 NOT DETECTED NOT DETECTED Final   Respiratory Syncytial Virus NOT DETECTED NOT DETECTED Final   Bordetella pertussis NOT DETECTED NOT DETECTED Final   Bordetella Parapertussis NOT DETECTED NOT DETECTED Final   Chlamydophila pneumoniae NOT DETECTED NOT DETECTED Final   Mycoplasma pneumoniae NOT DETECTED NOT DETECTED Final    Comment: Performed at [facility], [address],  [place], [place] [address]  MRSA Next Gen by PCR, Nasal     Status: None   Collection Time: 10/11/23  1:29 PM   Specimen: Nasal Mucosa; Nasal Swab  Result Value Ref Range Status   MRSA by PCR Next Gen NOT DETECTED NOT DETECTED Final    Comment: (NOTE) The GeneXpert MRSA Assay (FDA approved for NASAL specimens only), is one component of a comprehensive MRSA colonization surveillance program. It is not intended to diagnose MRSA infection nor to guide or monitor treatment for MRSA infections. Test performance is not FDA approved in patients less than 97 years old. Performed at [facility], [address] [place], [place] [address]   Culture, blood (Routine X 2) w Reflex to ID Panel     Status: None (Preliminary result)   Collection Time: 10/12/23  4:20 AM   Specimen: BLOOD LEFT ARM  Result Value Ref Range Status   Specimen Description BLOOD LEFT ARM  Final   Special Requests   Final    BOTTLES DRAWN AEROBIC AND ANAEROBIC Blood Culture adequate volume   Culture   Final    NO GROWTH 1 DAY Performed at [facility], [address] [place], [place] [address]    Report Status PENDING  Incomplete  Culture, blood (Routine X 2) w Reflex  to ID Panel     Status: None (Preliminary result)   Collection Time: 10/12/23  4:27 AM   Specimen: BLOOD LEFT ARM  Result Value Ref Range Status   Specimen Description BLOOD LEFT ARM  Final   Special Requests   Final    BOTTLES DRAWN AEROBIC AND ANAEROBIC Blood Culture adequate volume   Culture   Final    NO GROWTH 1 DAY Performed at [facility], [address] [place], [place] [address]    Report Status PENDING  Incomplete    Radiology Report    DG Chest Port 1 View Result Date: 10/13/2023 CLINICAL DATA:  Shortness of breath EXAM: PORTABLE CHEST 1 VIEW COMPARISON:  10/12/2023 FINDINGS: Stable cardiomediastinal contours. Aortic atherosclerosis. Asymmetric elevation of right hemidiaphragm. Multifocal airspace opacities within the left  lung are unchanged when compared with the previous exam. Right lung is clear. Visualized osseous structures are unremarkable. IMPRESSION: Unchanged multifocal airspace opacities within the left lung compatible with multifocal pneumonia. Electronically Signed   By: Kimberley Penman M.D.   On: 10/13/2023 07:28   DG Chest Port 1 View Result Date: 10/12/2023 CLINICAL DATA:  Shortness of breath. EXAM: PORTABLE CHEST 1 VIEW COMPARISON:  10/10/2023 FINDINGS: Stable cardiomediastinal contours. Aortic atherosclerosis. There is been interval worsening aeration to the left lower lung compared with the previous exam. This may reflect progressive atelectasis or consolidation and or underlying new pleural effusion. Right lung appears clear. IMPRESSION: Interval worsening aeration to the left lower lung compared with the previous exam. This may reflect progressive atelectasis/consolidation and/or underlying new pleural effusion. Electronically Signed   By: Kimberley Penman M.D.   On: 10/12/2023 06:59     Signature  -   Lynnwood Sauer M.D on 10/13/2023 at 9:18 AM   -  To page go to [email or web address]

## 2023-10-13 NOTE — TOC Transition Note (Signed)
 Transition of Care [facility]) - Discharge Note   Patient Details  Name: Kelly [name] MRN: [id number] Date of Birth: 21-Jan-1935  [facility] CM/SW Contact:  Adline Hook, LCSW Phone Number: 10/13/2023, 1:22 PM   Clinical Narrative:     Per MD patient ready for DC to [facility]. RN, patient, patient's daughter  and facility notified of DC. Discharge Summary sent to facility. DC packet on chart. Ambulance transport requested for patient.    RN to call report to [phone].   CSW will sign off for now as social work intervention is no longer needed. Please consult us  again if new needs arise.   Final next level of care: Hospice Medical Facility Barriers to Discharge: No Barriers Identified   Patient Goals and CMS Choice Patient states their goals for this hospitalization and ongoing recovery are:: [facility]          Discharge Placement              Patient chooses bed at: Other - please specify in the comment section below: Patient to be transferred to facility by: [facility] Name of family member notified: Daughter, Harper Lily Patient and family notified of of transfer: 10/13/23  Discharge Plan and Services Additional resources added to the After Visit Summary for                                       Social Drivers of Health (SDOH) Interventions SDOH Screenings   Food Insecurity: No Food Insecurity (10/11/2023)  Housing: Low Risk  (10/11/2023)  Transportation Needs: No Transportation Needs (10/11/2023)  Utilities: Not At Risk (10/11/2023)  Depression (PHQ2-9): Low Risk  (01/16/2019)  Social Connections: Patient Unable To Answer (10/11/2023)  Tobacco Use: Medium Risk (10/10/2023)     Readmission Risk Interventions     No data to display

## 2023-10-13 NOTE — Consult Note (Signed)
 Palliative Medicine Inpatient Consult Note  Consulting Provider: Cala Castleman, MD   Reason for consult:   Palliative Care Consult Services Palliative Medicine Consult  Reason for Consult? Dementia, stopped eating, aspirating, poor prognosis, goals of care   10/13/2023  HPI:  Per intake H&P -->  89 y.o. female with medical history significant of dementia, bedbound status, hypertension, hyperlipidemia, hypothyroidism, CKD 3A, anemia, diabetes, anxiety, depression, mood disorder with psychosis presenting with tachycardia and fever.   The Palliative care team has been asked to get involved for additional goals of care conversations.   Clinical Assessment/Goals of Care:  *Please note that this is a verbal dictation therefore any spelling or grammatical errors are due to the "Dragon Medical One" system interpretation.  I have reviewed medical records including EPIC notes, labs and imaging, received report from bedside RN, assessed the patient who is lying in bed not verbal. Has no signs of nonverbal distress.    A conference call was held with patients daughter, Adah Acron, April, and son, Clare Critchley to further discuss diagnosis prognosis, GOC, EOL wishes, disposition and options.   I introduced Palliative Medicine as specialized medical care for people living with serious illness. It focuses on providing relief from the symptoms and stress of a serious illness. The goal is to improve quality of life for both the patient and the family.  Medical History Review and Understanding:  A review of Laraine's prior medical history was held inclusive of end-stage dementia, hypertension, hyperlipidemia, chronic kidney disease, type 2 diabetes, anxiety, depression, hypothyroidism, and generalized failure to thrive.  Social History:  Malaia is from [place] .  She is a widow.  She has 3 children.  She is identified to have worked hard throughout the duration of her life and retired in  her mid 17s.  She used to love spending time with her family and socializing in general.  She is a woman of faith practicing within Christianity.  Functional and Nutritional State:  Preceding hospitalization, Alexandrina had lived at [facility] for the past 6 years.  She has become completely dependent on staff for all B ADLs.  She has had ongoing issues with appetite and has generally just not appeared interested in eating and drinking.  Advance Directives:  A detailed discussion was had today regarding advanced directives.  Advance directives are on file and have been reviewed appropriately.  Code Status:  Concepts specific to code status, artifical feeding and hydration, continued IV antibiotics and rehospitalization was had.  The difference between a aggressive medical intervention path  and a palliative comfort care path for this patient at this time was had.   Jenavi is an established DO NOT RESUSCITATE DO NOT INTUBATE CODE STATUS.  Discussion:  Patient's family share with me that over the last month or so she is declined significantly.  They share that she has been more fetal in regards to bed positioning, has had little to no verbalization, has had increase with swallowing difficulties and her diet has been changed to pure at her nursing facility, in addition she has not had any want need or desire to eat and drink.  I reviewed with patient's family the progressive nature of Alzheimer's dementia and how often patients may not die of the disease itself but die of complications of the disease.  We discussed that per what they have shared with me she seems to be at the final stage of the disease.  She has unfortunately been afflicted by a pressure wound on  her hip which has been painful for her.  Patient's family are really at odds in terms of what to do next as they see their mother's declining and feel that she has suffered quite significantly over the years.  We discussed the options of  continuing present treatment though with the knowledge she will likely be in the same position in a short period of time.  We discussed the option of focusing on dignity quality and comfort. We talked about transition to comfort measures in house and what that would entail inclusive of medications to control pain, dyspnea, agitation, nausea, itching, and hiccups.  We discussed stopping all uneccessary measures such as cardiac monitoring, blood draws, needle sticks, and frequent vital signs.   Patient's family feel that she has been through enough and would like to focus on comfort.  They are in agreement with low-dose morphine around-the-clock for both her hip pain from her sacral decubitus ulcer as well as for her ongoing air hunger from aspirational events and dysphagia.  Patient's family is interested in [place] transitioning to the hospice of the [facility] hospice home in [place].  Utilized reflective listening throughout our time together.   Discussed the importance of continued conversation with family and their  medical providers regarding overall plan of care and treatment options, ensuring decisions are within the context of the patients values and GOCs.  Decision Maker: Reid,Cynthia (Daughter): [phone] (Mobile)   SUMMARY OF RECOMMENDATIONS   DNAR/DNI  Comfort care  Low-dose morphine around-the-clock for pain and air hunger  Additional as needed medications per [facility]  Unrestricted visitation  Appreciate MSW making referral to hospice of the [facility] inpatient hosp[facility] home  Ongoing palliative care support  Code Status/Advance Care Planning: DNAR/DNI  Palliative Prophylaxis:  Aspiration, Bowel Regimen, Delirium Protocol, Frequent Pain Assessment, Oral Care, Palliative Wound Care, and Turn Reposition  Additional Recommendations (Limitations, Scope, Preferences): Continue current care  Psycho-social/Spiritual:  Desire for further Chaplaincy support: Yes Additional  Recommendations: Education on end-of-life process and progressive dementia   Prognosis: Limited days to weeks  Discharge Planning: Discharge to inpatient hospice once approved and has a bed offer.  Vitals:   10/12/23 2306 10/13/23 0300  BP: 124/67 (!) 111/55  Pulse: 66 68  Resp: (!) 23 16  Temp: 98.3 F (36.8 C) 98.4 F (36.9 C)  SpO2: 95% 94%    Intake/Output Summary (Last 24 hours) at 10/13/2023 0981 Last data filed at 10/13/2023 0028 Gross per 24 hour  Intake --  Output 600 ml  Net -600 ml   Last Weight  Most recent update: 10/12/2023  5:49 PM    Weight  61.1 kg (134 lb 11.2 oz)            Gen: Frail elderly African-American female in no acute distress HEENT: moist mucous membranes CV: Regular rate and rhythm  PULM: On room air breathing is even and nonlabored ABD: Flat, soft, nontender EXT: No edema Neuro: Opens eyes does not respond  PPS: 10%   This conversation/these recommendations were discussed with patient primary care team, Dr. Zelda Hickman   Total Time: 92 Billing based on MDM: High ______________________________________________________ [facility] [place] Palliative Medicine Team Team Cell Phone: [phone] Please utilize secure chat with additional questions, if there is no response within 30 minutes please call the above phone number  Palliative Medicine Team providers are available by phone from 7am to 7pm daily and can be reached through the team cell phone.  Should this patient require assistance outside of  these hours, please call the patient's attending physician.

## 2023-10-13 NOTE — Discharge Instructions (Signed)
Disposition.  Residential hospice °Condition.  Guarded °CODE STATUS.  DNR °Activity.  With assistance as tolerated, full fall precautions. °Diet.  Soft with feeding assistance and aspiration precautions. °Goal of care.  Comfort. ° °

## 2023-10-13 NOTE — Progress Notes (Addendum)
 Addendum: Patient will move forward with palliative care, all antibiotics stopped.   Leonora Ramus, PharmD, BCPS PGY2 Pharmacy Resident      Pharmacy Antibiotic Note  Kelly [name] is a 89 y.o. female admitted on 10/10/2023 with sepsis.  Pharmacy has been consulted for vancomycin  dosing.  Blood cultures now positive for gram positive cocci in both sets of blood cultures drawn on 4/24, speciation pending. Patient also had a rapid ID blood culture pop positive for staph epi on 4/24, thought to be a contaminant at that time. Patient has been on ampicillin/sulbactam since 4/26 with concerns for aspiration pneumonia. MD would like to continue ampicillin in addition to the vancomycin .   Plan: Start vancomycin  1000mg  q24 hours (eAUC 445, TBW, Scr rounded to 0.8, Vd 0.72)  Continue ampicillin/sulbactam per MD Monitor signs and symptoms of infection   Height: 5\' 9"  (175.3 cm) Weight: 61.1 kg (134 lb 11.2 oz) IBW/kg (Calculated) : 66.2  Temp (24hrs), Avg:98.2 F (36.8 C), Min:98 F (36.7 C), Max:98.4 F (36.9 C)  Recent Labs  Lab 10/10/23 1514 10/10/23 1537 10/10/23 1850 10/10/23 2030 10/11/23 0320 10/11/23 0332 10/12/23 0420 10/12/23 0427 10/13/23 0640  WBC 5.7  --   --   --  12.4*  --  16.3*  --  15.6*  CREATININE 1.17*  --   --   --  0.79  --  0.78  --  0.68  LATICACIDVEN  --  3.4* 7.7* 5.3*  --  2.0*  --  1.4  --     Estimated Creatinine Clearance: 47.8 mL/min (by C-G formula based on SCr of 0.68 mg/dL).    No Known Allergies  Antimicrobials this admission: Cefepime  4/24 >4/26 Flagyl  IV 4/24 > 4/26  Vanc 4/24 > 4/26, 4/27 >> Ampicillin / sulbactam 4/26 >>  Dose adjustments this admission: Vancomycin  previously at 750mg  q24, dose increased given improved renal function   Microbiology results: 4/24 BCx: gram positive cocci 3/4 bottles    Thank you for allowing pharmacy to be a part of this patient's care.  Leonora Ramus, PharmD, BCPS PGY2 Pharmacy Resident

## 2023-10-13 NOTE — Progress Notes (Signed)
   10/13/23 1259  Hand-off documentation  Hand-off Given Given to Transfer Unit/facility  Report given to (Full Name) [facility] RN

## 2023-10-13 NOTE — TOC Initial Note (Signed)
 Transition of Care [facility]) - Initial/Assessment Note    Patient Details  Name: Kelly [name] MRN: [id number] Date of Birth: 1934/08/10  [facility] CM/SW Contact:    Adline Hook, LCSW Phone Number: 10/13/2023, 10:59 AM  Clinical Narrative:                  Per palliative, family has decided to move towards comfort care and residential hospice. [facility] [place], CSW gave referral to [place]. Nellie Banas will follow up with family. Family contact person is Harper Lily.  Expected Discharge Plan: Hospice Medical Facility Barriers to Discharge: No Barriers Identified   Patient Goals and CMS Choice Patient states their goals for this hospitalization and ongoing recovery are:: Residental hospice          Expected Discharge Plan and Services                                              Prior Living Arrangements/Services                       Activities of Daily Living   ADL Screening (condition at time of admission) Independently performs ADLs?: No Does the patient have a NEW difficulty with bathing/dressing/toileting/self-feeding that is expected to last >3 days?: No Does the patient have a NEW difficulty with getting in/out of bed, walking, or climbing stairs that is expected to last >3 days?: No Does the patient have a NEW difficulty with communication that is expected to last >3 days?: No Is the patient deaf or have difficulty hearing?: No Does the patient have difficulty seeing, even when wearing glasses/contacts?: No Does the patient have difficulty concentrating, remembering, or making decisions?: No  Permission Sought/Granted                  Emotional Assessment   Attitude/Demeanor/Rapport: Unable to Assess Affect (typically observed): Unable to Assess   Alcohol / Substance Use: Not Applicable Psych Involvement: No (comment)  Admission diagnosis:  Hypernatremia [E87.0] AKI (acute kidney  injury) (HCC) [N17.9] Sepsis (HCC) [A41.9] Sepsis with acute renal failure without septic shock, due to unspecified organism, unspecified acute renal failure type (HCC) [A41.9, R65.20, N17.9] Patient Active Problem List   Diagnosis Date Noted   Sepsis (HCC) 10/10/2023   Stage 3a chronic kidney disease (CKD) (HCC) 05/04/2022   Bedbound 05/04/2022   Epistaxis 10/24/2021   Normochromic normocytic anemia 10/24/2021   Unspecified protein-calorie malnutrition (HCC) 05/02/2021   Pancytopenia (HCC) 07/05/2020   Secondary DM with CKD stage 3 and hypertension (HCC)    Fall 05/03/2019   Anxiety 04/16/2019   Vitamin D  deficiency    Occult blood positive stool 02/17/2019   MDD (major depressive disorder)    Chronic leukopenia 03/04/2018   Thrombocytopenia (HCC) 02/20/2018   Mood disorder with psychosis (HCC) 02/20/2018   Hyperlipidemia 10/03/2016   Dementia with behavioral disturbance (HCC) 04/12/2016   Essential hypertension 04/12/2016   Hypothyroidism 04/12/2016   Advance care planning 03/19/2015   PCP:  Pcp, No Pharmacy:   [facility] - [place], [place] - [address] [place] [address] Phone: [phone] Fax: [phone]     Social Drivers of Health (SDOH) Social History: SDOH Screenings   Food Insecurity: No Food Insecurity (10/11/2023)  Housing: Low Risk  (10/11/2023)  Transportation  Needs: No Transportation Needs (10/11/2023)  Utilities: Not At Risk (10/11/2023)  Depression (PHQ2-9): Low Risk  (01/16/2019)  Social Connections: Patient Unable To Answer (10/11/2023)  Tobacco Use: Medium Risk (10/10/2023)   SDOH Interventions:     Readmission Risk Interventions     No data to display

## 2023-10-14 LAB — CULTURE, BLOOD (ROUTINE X 2): Special Requests: ADEQUATE

## 2023-10-17 LAB — CULTURE, BLOOD (ROUTINE X 2)
Culture: NO GROWTH
Culture: NO GROWTH
Special Requests: ADEQUATE
Special Requests: ADEQUATE

## 2023-11-17 DEATH — deceased
